# Patient Record
Sex: Male | Born: 1955 | Race: White | Hispanic: No | Marital: Married | Smoking: Never smoker
Health system: Southern US, Community
[De-identification: ages and names within clinical notes are randomized; demographics above are authoritative.]

## PROBLEM LIST (undated history)

## (undated) DIAGNOSIS — M4802 Spinal stenosis, cervical region: Secondary | ICD-10-CM

## (undated) DIAGNOSIS — N186 End stage renal disease: Secondary | ICD-10-CM

## (undated) DIAGNOSIS — E785 Hyperlipidemia, unspecified: Secondary | ICD-10-CM

## (undated) DIAGNOSIS — R1310 Dysphagia, unspecified: Secondary | ICD-10-CM

## (undated) DIAGNOSIS — I255 Ischemic cardiomyopathy: Secondary | ICD-10-CM

## (undated) DIAGNOSIS — N289 Disorder of kidney and ureter, unspecified: Secondary | ICD-10-CM

## (undated) DIAGNOSIS — I480 Paroxysmal atrial fibrillation: Secondary | ICD-10-CM

## (undated) DIAGNOSIS — N051 Unspecified nephritic syndrome with focal and segmental glomerular lesions: Secondary | ICD-10-CM

## (undated) DIAGNOSIS — I2510 Atherosclerotic heart disease of native coronary artery without angina pectoris: Secondary | ICD-10-CM

## (undated) DIAGNOSIS — Z992 Dependence on renal dialysis: Secondary | ICD-10-CM

## (undated) DIAGNOSIS — L139 Bullous disorder, unspecified: Secondary | ICD-10-CM

## (undated) HISTORY — DX: End stage renal disease: N18.6

## (undated) HISTORY — PX: CARPAL TUNNEL RELEASE: SHX101

## (undated) HISTORY — DX: Ischemic cardiomyopathy: I25.5

## (undated) HISTORY — DX: Paroxysmal atrial fibrillation: I48.0

## (undated) HISTORY — DX: Atherosclerotic heart disease of native coronary artery without angina pectoris: I25.10

---

## 2006-04-11 ENCOUNTER — Ambulatory Visit: Payer: Self-pay | Admitting: Gastroenterology

## 2006-07-04 ENCOUNTER — Emergency Department: Payer: Self-pay | Admitting: Emergency Medicine

## 2012-05-04 ENCOUNTER — Emergency Department: Payer: Self-pay | Admitting: Emergency Medicine

## 2012-05-04 LAB — BASIC METABOLIC PANEL
BUN: 15 mg/dL (ref 7–18)
Chloride: 97 mmol/L — ABNORMAL LOW (ref 98–107)
Co2: 35 mmol/L — ABNORMAL HIGH (ref 21–32)
Creatinine: 4.35 mg/dL — ABNORMAL HIGH (ref 0.60–1.30)
EGFR (African American): 16 — ABNORMAL LOW
EGFR (Non-African Amer.): 14 — ABNORMAL LOW

## 2012-05-04 LAB — CBC
HGB: 11.3 g/dL — ABNORMAL LOW (ref 13.0–18.0)
MCH: 32.5 pg (ref 26.0–34.0)
MCV: 95 fL (ref 80–100)
Platelet: 73 10*3/uL — ABNORMAL LOW (ref 150–440)
RDW: 14.8 % — ABNORMAL HIGH (ref 11.5–14.5)

## 2012-05-04 LAB — CK TOTAL AND CKMB (NOT AT ARMC): CK-MB: <0.5 ng/mL — ABNORMAL LOW (ref 0.5–3.6)

## 2012-08-12 ENCOUNTER — Emergency Department: Payer: Self-pay | Admitting: Emergency Medicine

## 2012-08-12 LAB — BASIC METABOLIC PANEL
Calcium, Total: 8.7 mg/dL (ref 8.5–10.1)
EGFR (African American): 9 — ABNORMAL LOW
EGFR (Non-African Amer.): 8 — ABNORMAL LOW
Glucose: 77 mg/dL (ref 65–99)
Osmolality: 283 (ref 275–301)
Potassium: 4 mmol/L (ref 3.5–5.1)

## 2012-08-12 LAB — CBC
HCT: 27.1 % — ABNORMAL LOW (ref 40.0–52.0)
MCH: 31.9 pg (ref 26.0–34.0)
MCV: 94 fL (ref 80–100)
Platelet: 57 10*3/uL — ABNORMAL LOW (ref 150–440)
RBC: 2.88 10*6/uL — ABNORMAL LOW (ref 4.40–5.90)
RDW: 15.4 % — ABNORMAL HIGH (ref 11.5–14.5)

## 2012-08-12 LAB — DIFFERENTIAL
Eosinophil #: 0 10*3/uL (ref 0.0–0.7)
Lymphocyte #: 0.8 10*3/uL — ABNORMAL LOW (ref 1.0–3.6)
Monocyte #: 0.2 x10 3/mm (ref 0.2–1.0)
Monocyte %: 9.3 %

## 2013-01-04 HISTORY — PX: OTHER SURGICAL HISTORY: SHX169

## 2013-06-16 ENCOUNTER — Emergency Department: Payer: Self-pay | Admitting: Emergency Medicine

## 2013-06-16 LAB — CBC
HCT: 37.5 % — AB (ref 40.0–52.0)
HGB: 12.1 g/dL — ABNORMAL LOW (ref 13.0–18.0)
MCH: 32.1 pg (ref 26.0–34.0)
MCHC: 32.2 g/dL (ref 32.0–36.0)
MCV: 100 fL (ref 80–100)
Platelet: 58 10*3/uL — ABNORMAL LOW (ref 150–440)
RBC: 3.77 10*6/uL — ABNORMAL LOW (ref 4.40–5.90)
RDW: 14.1 % (ref 11.5–14.5)
WBC: 2.4 10*3/uL — ABNORMAL LOW (ref 3.8–10.6)

## 2013-06-16 LAB — COMPREHENSIVE METABOLIC PANEL
Albumin: 3.3 g/dL — ABNORMAL LOW (ref 3.4–5.0)
Alkaline Phosphatase: 62 U/L
Anion Gap: 7 (ref 7–16)
BUN: 38 mg/dL — AB (ref 7–18)
Bilirubin,Total: 0.4 mg/dL (ref 0.2–1.0)
Calcium, Total: 8.9 mg/dL (ref 8.5–10.1)
Chloride: 103 mmol/L (ref 98–107)
Co2: 31 mmol/L (ref 21–32)
Creatinine: 8.82 mg/dL — ABNORMAL HIGH (ref 0.60–1.30)
EGFR (African American): 7 — ABNORMAL LOW
EGFR (Non-African Amer.): 6 — ABNORMAL LOW
Glucose: 77 mg/dL (ref 65–99)
Osmolality: 289 (ref 275–301)
POTASSIUM: 4.4 mmol/L (ref 3.5–5.1)
SGOT(AST): 25 U/L (ref 15–37)
SGPT (ALT): 19 U/L (ref 12–78)
Sodium: 141 mmol/L (ref 136–145)
Total Protein: 7.9 g/dL (ref 6.4–8.2)

## 2016-01-05 HISTORY — PX: SPINAL FUSION: SHX223

## 2016-06-10 DIAGNOSIS — M4802 Spinal stenosis, cervical region: Secondary | ICD-10-CM | POA: Diagnosis not present

## 2016-06-10 DIAGNOSIS — R1312 Dysphagia, oropharyngeal phase: Secondary | ICD-10-CM | POA: Diagnosis not present

## 2016-06-10 DIAGNOSIS — F3289 Other specified depressive episodes: Secondary | ICD-10-CM | POA: Diagnosis not present

## 2016-06-10 DIAGNOSIS — Z5189 Encounter for other specified aftercare: Secondary | ICD-10-CM | POA: Diagnosis not present

## 2016-06-10 DIAGNOSIS — F329 Major depressive disorder, single episode, unspecified: Secondary | ICD-10-CM | POA: Diagnosis not present

## 2016-06-10 DIAGNOSIS — K219 Gastro-esophageal reflux disease without esophagitis: Secondary | ICD-10-CM | POA: Diagnosis not present

## 2016-06-10 DIAGNOSIS — R498 Other voice and resonance disorders: Secondary | ICD-10-CM | POA: Diagnosis not present

## 2016-06-10 DIAGNOSIS — Z992 Dependence on renal dialysis: Secondary | ICD-10-CM | POA: Diagnosis not present

## 2016-06-10 DIAGNOSIS — R2681 Unsteadiness on feet: Secondary | ICD-10-CM | POA: Diagnosis not present

## 2016-06-10 DIAGNOSIS — G8929 Other chronic pain: Secondary | ICD-10-CM | POA: Diagnosis not present

## 2016-06-10 DIAGNOSIS — N186 End stage renal disease: Secondary | ICD-10-CM | POA: Diagnosis not present

## 2016-06-10 DIAGNOSIS — G8918 Other acute postprocedural pain: Secondary | ICD-10-CM | POA: Diagnosis not present

## 2016-06-10 DIAGNOSIS — R531 Weakness: Secondary | ICD-10-CM | POA: Diagnosis not present

## 2016-06-10 DIAGNOSIS — M62838 Other muscle spasm: Secondary | ICD-10-CM | POA: Diagnosis not present

## 2016-06-10 DIAGNOSIS — I10 Essential (primary) hypertension: Secondary | ICD-10-CM | POA: Diagnosis not present

## 2016-06-10 DIAGNOSIS — M6281 Muscle weakness (generalized): Secondary | ICD-10-CM | POA: Diagnosis not present

## 2016-06-10 DIAGNOSIS — G4700 Insomnia, unspecified: Secondary | ICD-10-CM | POA: Diagnosis not present

## 2016-06-10 DIAGNOSIS — E784 Other hyperlipidemia: Secondary | ICD-10-CM | POA: Diagnosis not present

## 2016-06-10 DIAGNOSIS — M4322 Fusion of spine, cervical region: Secondary | ICD-10-CM | POA: Diagnosis not present

## 2016-06-10 DIAGNOSIS — E785 Hyperlipidemia, unspecified: Secondary | ICD-10-CM | POA: Diagnosis not present

## 2016-06-10 DIAGNOSIS — H409 Unspecified glaucoma: Secondary | ICD-10-CM | POA: Diagnosis not present

## 2016-06-10 DIAGNOSIS — R41841 Cognitive communication deficit: Secondary | ICD-10-CM | POA: Diagnosis not present

## 2016-06-13 DIAGNOSIS — H409 Unspecified glaucoma: Secondary | ICD-10-CM | POA: Diagnosis not present

## 2016-06-13 DIAGNOSIS — M4322 Fusion of spine, cervical region: Secondary | ICD-10-CM | POA: Diagnosis not present

## 2016-06-13 DIAGNOSIS — F329 Major depressive disorder, single episode, unspecified: Secondary | ICD-10-CM | POA: Diagnosis not present

## 2016-06-13 DIAGNOSIS — K219 Gastro-esophageal reflux disease without esophagitis: Secondary | ICD-10-CM | POA: Diagnosis not present

## 2016-06-13 DIAGNOSIS — N186 End stage renal disease: Secondary | ICD-10-CM | POA: Diagnosis not present

## 2016-06-13 DIAGNOSIS — R531 Weakness: Secondary | ICD-10-CM | POA: Diagnosis not present

## 2016-06-13 DIAGNOSIS — Z992 Dependence on renal dialysis: Secondary | ICD-10-CM | POA: Diagnosis not present

## 2016-06-13 DIAGNOSIS — I10 Essential (primary) hypertension: Secondary | ICD-10-CM | POA: Diagnosis not present

## 2016-06-22 DIAGNOSIS — F329 Major depressive disorder, single episode, unspecified: Secondary | ICD-10-CM | POA: Diagnosis not present

## 2016-06-22 DIAGNOSIS — Z992 Dependence on renal dialysis: Secondary | ICD-10-CM | POA: Diagnosis not present

## 2016-06-22 DIAGNOSIS — I10 Essential (primary) hypertension: Secondary | ICD-10-CM | POA: Diagnosis not present

## 2016-06-22 DIAGNOSIS — N186 End stage renal disease: Secondary | ICD-10-CM | POA: Diagnosis not present

## 2016-06-22 DIAGNOSIS — K219 Gastro-esophageal reflux disease without esophagitis: Secondary | ICD-10-CM | POA: Diagnosis not present

## 2016-06-22 DIAGNOSIS — G8918 Other acute postprocedural pain: Secondary | ICD-10-CM | POA: Diagnosis not present

## 2016-06-22 DIAGNOSIS — E785 Hyperlipidemia, unspecified: Secondary | ICD-10-CM | POA: Diagnosis not present

## 2016-06-22 DIAGNOSIS — H409 Unspecified glaucoma: Secondary | ICD-10-CM | POA: Diagnosis not present

## 2016-06-22 DIAGNOSIS — M4322 Fusion of spine, cervical region: Secondary | ICD-10-CM | POA: Diagnosis not present

## 2016-07-03 DIAGNOSIS — N186 End stage renal disease: Secondary | ICD-10-CM | POA: Diagnosis not present

## 2016-07-03 DIAGNOSIS — Z992 Dependence on renal dialysis: Secondary | ICD-10-CM | POA: Diagnosis not present

## 2016-07-07 DIAGNOSIS — I10 Essential (primary) hypertension: Secondary | ICD-10-CM | POA: Diagnosis not present

## 2016-07-07 DIAGNOSIS — N186 End stage renal disease: Secondary | ICD-10-CM | POA: Diagnosis not present

## 2016-07-07 DIAGNOSIS — H409 Unspecified glaucoma: Secondary | ICD-10-CM | POA: Diagnosis not present

## 2016-07-07 DIAGNOSIS — F329 Major depressive disorder, single episode, unspecified: Secondary | ICD-10-CM | POA: Diagnosis not present

## 2016-07-07 DIAGNOSIS — M4322 Fusion of spine, cervical region: Secondary | ICD-10-CM | POA: Diagnosis not present

## 2016-07-07 DIAGNOSIS — K219 Gastro-esophageal reflux disease without esophagitis: Secondary | ICD-10-CM | POA: Diagnosis not present

## 2016-07-07 DIAGNOSIS — Z992 Dependence on renal dialysis: Secondary | ICD-10-CM | POA: Diagnosis not present

## 2016-07-10 DIAGNOSIS — H409 Unspecified glaucoma: Secondary | ICD-10-CM | POA: Diagnosis not present

## 2016-07-10 DIAGNOSIS — G8929 Other chronic pain: Secondary | ICD-10-CM | POA: Diagnosis not present

## 2016-07-10 DIAGNOSIS — G4733 Obstructive sleep apnea (adult) (pediatric): Secondary | ICD-10-CM | POA: Diagnosis not present

## 2016-07-10 DIAGNOSIS — N186 End stage renal disease: Secondary | ICD-10-CM | POA: Diagnosis not present

## 2016-07-10 DIAGNOSIS — Z992 Dependence on renal dialysis: Secondary | ICD-10-CM | POA: Diagnosis not present

## 2016-07-10 DIAGNOSIS — Z940 Kidney transplant status: Secondary | ICD-10-CM | POA: Diagnosis not present

## 2016-07-10 DIAGNOSIS — I1311 Hypertensive heart and chronic kidney disease without heart failure, with stage 5 chronic kidney disease, or end stage renal disease: Secondary | ICD-10-CM | POA: Diagnosis not present

## 2016-07-10 DIAGNOSIS — Z85528 Personal history of other malignant neoplasm of kidney: Secondary | ICD-10-CM | POA: Diagnosis not present

## 2016-07-10 DIAGNOSIS — H269 Unspecified cataract: Secondary | ICD-10-CM | POA: Diagnosis not present

## 2016-07-10 DIAGNOSIS — F329 Major depressive disorder, single episode, unspecified: Secondary | ICD-10-CM | POA: Diagnosis not present

## 2016-07-10 DIAGNOSIS — Z4789 Encounter for other orthopedic aftercare: Secondary | ICD-10-CM | POA: Diagnosis not present

## 2016-07-10 DIAGNOSIS — G629 Polyneuropathy, unspecified: Secondary | ICD-10-CM | POA: Diagnosis not present

## 2016-07-10 DIAGNOSIS — D509 Iron deficiency anemia, unspecified: Secondary | ICD-10-CM | POA: Diagnosis not present

## 2016-07-10 DIAGNOSIS — L120 Bullous pemphigoid: Secondary | ICD-10-CM | POA: Diagnosis not present

## 2016-07-12 ENCOUNTER — Emergency Department: Payer: Medicare Other

## 2016-07-12 ENCOUNTER — Inpatient Hospital Stay
Admission: EM | Admit: 2016-07-12 | Discharge: 2016-07-17 | DRG: 444 | Disposition: A | Discharge status: Home / Self Care | Payer: Medicare Other | Attending: Internal Medicine | Admitting: Internal Medicine

## 2016-07-12 DIAGNOSIS — R2230 Localized swelling, mass and lump, unspecified upper limb: Secondary | ICD-10-CM

## 2016-07-12 DIAGNOSIS — Z9081 Acquired absence of spleen: Secondary | ICD-10-CM

## 2016-07-12 DIAGNOSIS — Z882 Allergy status to sulfonamides status: Secondary | ICD-10-CM

## 2016-07-12 DIAGNOSIS — K810 Acute cholecystitis: Secondary | ICD-10-CM | POA: Diagnosis not present

## 2016-07-12 DIAGNOSIS — R001 Bradycardia, unspecified: Secondary | ICD-10-CM | POA: Diagnosis not present

## 2016-07-12 DIAGNOSIS — K819 Cholecystitis, unspecified: Principal | ICD-10-CM | POA: Diagnosis present

## 2016-07-12 DIAGNOSIS — L89029 Pressure ulcer of left elbow, unspecified stage: Secondary | ICD-10-CM | POA: Diagnosis present

## 2016-07-12 DIAGNOSIS — I120 Hypertensive chronic kidney disease with stage 5 chronic kidney disease or end stage renal disease: Secondary | ICD-10-CM | POA: Diagnosis present

## 2016-07-12 DIAGNOSIS — Z79899 Other long term (current) drug therapy: Secondary | ICD-10-CM | POA: Diagnosis not present

## 2016-07-12 DIAGNOSIS — R609 Edema, unspecified: Secondary | ICD-10-CM

## 2016-07-12 DIAGNOSIS — R17 Unspecified jaundice: Secondary | ICD-10-CM

## 2016-07-12 DIAGNOSIS — I959 Hypotension, unspecified: Secondary | ICD-10-CM | POA: Diagnosis not present

## 2016-07-12 DIAGNOSIS — Q819 Epidermolysis bullosa, unspecified: Secondary | ICD-10-CM

## 2016-07-12 DIAGNOSIS — Z8249 Family history of ischemic heart disease and other diseases of the circulatory system: Secondary | ICD-10-CM

## 2016-07-12 DIAGNOSIS — Z992 Dependence on renal dialysis: Secondary | ICD-10-CM | POA: Diagnosis not present

## 2016-07-12 DIAGNOSIS — D631 Anemia in chronic kidney disease: Secondary | ICD-10-CM | POA: Diagnosis present

## 2016-07-12 DIAGNOSIS — Z981 Arthrodesis status: Secondary | ICD-10-CM

## 2016-07-12 DIAGNOSIS — R059 Cough, unspecified: Secondary | ICD-10-CM

## 2016-07-12 DIAGNOSIS — N2581 Secondary hyperparathyroidism of renal origin: Secondary | ICD-10-CM | POA: Diagnosis present

## 2016-07-12 DIAGNOSIS — M79601 Pain in right arm: Secondary | ICD-10-CM | POA: Diagnosis not present

## 2016-07-12 DIAGNOSIS — N269 Renal sclerosis, unspecified: Secondary | ICD-10-CM | POA: Diagnosis present

## 2016-07-12 DIAGNOSIS — R05 Cough: Secondary | ICD-10-CM | POA: Diagnosis not present

## 2016-07-12 DIAGNOSIS — A419 Sepsis, unspecified organism: Secondary | ICD-10-CM

## 2016-07-12 DIAGNOSIS — R2232 Localized swelling, mass and lump, left upper limb: Secondary | ICD-10-CM | POA: Diagnosis not present

## 2016-07-12 DIAGNOSIS — R109 Unspecified abdominal pain: Secondary | ICD-10-CM | POA: Diagnosis not present

## 2016-07-12 DIAGNOSIS — Z880 Allergy status to penicillin: Secondary | ICD-10-CM

## 2016-07-12 DIAGNOSIS — F329 Major depressive disorder, single episode, unspecified: Secondary | ICD-10-CM | POA: Diagnosis not present

## 2016-07-12 DIAGNOSIS — M7989 Other specified soft tissue disorders: Secondary | ICD-10-CM

## 2016-07-12 DIAGNOSIS — R531 Weakness: Secondary | ICD-10-CM | POA: Diagnosis not present

## 2016-07-12 DIAGNOSIS — Z888 Allergy status to other drugs, medicaments and biological substances status: Secondary | ICD-10-CM | POA: Diagnosis not present

## 2016-07-12 DIAGNOSIS — R400 Somnolence: Secondary | ICD-10-CM

## 2016-07-12 DIAGNOSIS — D72829 Elevated white blood cell count, unspecified: Secondary | ICD-10-CM

## 2016-07-12 DIAGNOSIS — D8989 Other specified disorders involving the immune mechanism, not elsewhere classified: Secondary | ICD-10-CM | POA: Diagnosis not present

## 2016-07-12 DIAGNOSIS — L03114 Cellulitis of left upper limb: Secondary | ICD-10-CM | POA: Diagnosis not present

## 2016-07-12 DIAGNOSIS — M79632 Pain in left forearm: Secondary | ICD-10-CM

## 2016-07-12 DIAGNOSIS — Z4789 Encounter for other orthopedic aftercare: Secondary | ICD-10-CM | POA: Diagnosis not present

## 2016-07-12 DIAGNOSIS — N186 End stage renal disease: Secondary | ICD-10-CM | POA: Diagnosis not present

## 2016-07-12 DIAGNOSIS — R600 Localized edema: Secondary | ICD-10-CM | POA: Diagnosis not present

## 2016-07-12 DIAGNOSIS — Z905 Acquired absence of kidney: Secondary | ICD-10-CM

## 2016-07-12 DIAGNOSIS — I1311 Hypertensive heart and chronic kidney disease without heart failure, with stage 5 chronic kidney disease, or end stage renal disease: Secondary | ICD-10-CM | POA: Diagnosis not present

## 2016-07-12 DIAGNOSIS — G629 Polyneuropathy, unspecified: Secondary | ICD-10-CM | POA: Diagnosis not present

## 2016-07-12 DIAGNOSIS — L120 Bullous pemphigoid: Secondary | ICD-10-CM | POA: Diagnosis not present

## 2016-07-12 HISTORY — DX: Hyperlipidemia, unspecified: E78.5

## 2016-07-12 HISTORY — DX: Spinal stenosis, cervical region: M48.02

## 2016-07-12 HISTORY — DX: Unspecified nephritic syndrome with focal and segmental glomerular lesions: N05.1

## 2016-07-12 HISTORY — DX: Dysphagia, unspecified: R13.10

## 2016-07-12 LAB — TROPONIN I: Troponin I: 0.03 ng/mL

## 2016-07-12 LAB — DIFFERENTIAL
BASOS ABS: 0.2 10*3/uL — AB (ref 0–0.1)
Basophils Relative: 1 %
Eosinophils Absolute: 1.6 10*3/uL — ABNORMAL HIGH (ref 0–0.7)
Eosinophils Relative: 7 %
LYMPHS PCT: 7 %
Lymphs Abs: 1.5 10*3/uL (ref 1.0–3.6)
MONO ABS: 1.1 10*3/uL — AB (ref 0.2–1.0)
Monocytes Relative: 5 %
NEUTROS ABS: 18.4 10*3/uL — AB (ref 1.4–6.5)
Neutrophils Relative %: 80 %

## 2016-07-12 LAB — BASIC METABOLIC PANEL
Anion gap: 13 (ref 5–15)
BUN: 54 mg/dL — AB (ref 6–20)
CHLORIDE: 96 mmol/L — AB (ref 101–111)
CO2: 27 mmol/L (ref 22–32)
CREATININE: 7.16 mg/dL — AB (ref 0.61–1.24)
Calcium: 9.5 mg/dL (ref 8.9–10.3)
GFR calc Af Amer: 9 mL/min — ABNORMAL LOW (ref >60)
GFR calc non Af Amer: 7 mL/min — ABNORMAL LOW (ref >60)
GLUCOSE: 123 mg/dL — AB (ref 65–99)
Potassium: 3.8 mmol/L (ref 3.5–5.1)
SODIUM: 136 mmol/L (ref 135–145)

## 2016-07-12 LAB — HEPATIC FUNCTION PANEL
ALT: 47 U/L (ref 17–63)
AST: 75 U/L — ABNORMAL HIGH (ref 15–41)
Albumin: 2.5 g/dL — ABNORMAL LOW (ref 3.5–5.0)
Alkaline Phosphatase: 205 U/L — ABNORMAL HIGH (ref 38–126)
Bilirubin, Direct: 0.2 mg/dL (ref 0.1–0.5)
Indirect Bilirubin: 1.7 mg/dL — ABNORMAL HIGH (ref 0.3–0.9)
Total Bilirubin: 1.9 mg/dL — ABNORMAL HIGH (ref 0.3–1.2)
Total Protein: 7.6 g/dL (ref 6.5–8.1)

## 2016-07-12 LAB — CBC
HCT: 31.3 % — ABNORMAL LOW (ref 40.0–52.0)
Hemoglobin: 10.4 g/dL — ABNORMAL LOW (ref 13.0–18.0)
MCH: 34.5 pg — ABNORMAL HIGH (ref 26.0–34.0)
MCHC: 33.3 g/dL (ref 32.0–36.0)
MCV: 103.6 fL — AB (ref 80.0–100.0)
PLATELETS: 344 10*3/uL (ref 150–440)
RBC: 3.02 MIL/uL — ABNORMAL LOW (ref 4.40–5.90)
RDW: 15.2 % — AB (ref 11.5–14.5)
WBC: 23.4 10*3/uL — AB (ref 3.8–10.6)

## 2016-07-12 LAB — LACTIC ACID, PLASMA: Lactic Acid, Venous: 1.4 mmol/L (ref 0.5–1.9)

## 2016-07-12 MED ORDER — LORATADINE 10 MG PO TABS
10.0000 mg | ORAL_TABLET | Freq: Every day | ORAL | Status: DC
Start: 2016-07-13 — End: 2016-07-17
  Administered 2016-07-13 – 2016-07-16 (×4): 10 mg via ORAL
  Filled 2016-07-12 (×4): qty 1

## 2016-07-12 MED ORDER — TRAZODONE HCL 100 MG PO TABS
100.0000 mg | ORAL_TABLET | Freq: Every day | ORAL | Status: DC
  Administered 2016-07-13 – 2016-07-16 (×4): 100 mg via ORAL
  Filled 2016-07-12 (×6): qty 1

## 2016-07-12 MED ORDER — ATORVASTATIN CALCIUM 20 MG PO TABS
20.0000 mg | ORAL_TABLET | Freq: Every day | ORAL | Status: DC
  Administered 2016-07-13 – 2016-07-16 (×4): 20 mg via ORAL
  Filled 2016-07-12 (×4): qty 1

## 2016-07-12 MED ORDER — TIMOLOL HEMIHYDRATE 0.5 % OP SOLN
1.0000 [drp] | Freq: Every day | OPHTHALMIC | Status: DC
  Administered 2016-07-13 – 2016-07-16 (×3): 1 [drp] via OPHTHALMIC
  Filled 2016-07-12: qty 5

## 2016-07-12 MED ORDER — BISACODYL 10 MG RE SUPP
10.0000 mg | Freq: Every day | RECTAL | Status: DC | PRN
  Administered 2016-07-13: 10 mg via RECTAL
  Filled 2016-07-12: qty 1

## 2016-07-12 MED ORDER — DEXTROSE 5 % IV SOLN
500.0000 mg | Freq: Two times a day (BID) | INTRAVENOUS | Status: DC
  Administered 2016-07-13: 500 mg via INTRAVENOUS
  Filled 2016-07-12 (×2): qty 0.5

## 2016-07-12 MED ORDER — OXYCODONE HCL 5 MG PO TABS
5.0000 mg | ORAL_TABLET | ORAL | Status: DC | PRN
  Administered 2016-07-13: 10 mg via ORAL
  Administered 2016-07-13: 5 mg via ORAL
  Administered 2016-07-14 – 2016-07-16 (×5): 10 mg via ORAL
  Administered 2016-07-16: 5 mg via ORAL
  Administered 2016-07-16: 10 mg via ORAL
  Administered 2016-07-17 (×2): 5 mg via ORAL
  Filled 2016-07-12 (×2): qty 2
  Filled 2016-07-12: qty 1
  Filled 2016-07-12 (×3): qty 2
  Filled 2016-07-12 (×2): qty 1
  Filled 2016-07-12 (×3): qty 2

## 2016-07-12 MED ORDER — VANCOMYCIN HCL IN DEXTROSE 750-5 MG/150ML-% IV SOLN
750.0000 mg | INTRAVENOUS | Status: DC
  Administered 2016-07-13 – 2016-07-15 (×2): 750 mg via INTRAVENOUS
  Filled 2016-07-12 (×4): qty 150

## 2016-07-12 MED ORDER — OXYCODONE HCL 5 MG PO TABS
ORAL_TABLET | ORAL | Status: AC
  Administered 2016-07-12: 5 mg
  Filled 2016-07-12: qty 1

## 2016-07-12 MED ORDER — RENA-VITE PO TABS
1.0000 | ORAL_TABLET | Freq: Every day | ORAL | Status: DC
  Administered 2016-07-13 – 2016-07-16 (×4): 1 via ORAL
  Filled 2016-07-12 (×6): qty 1

## 2016-07-12 MED ORDER — NAPROXEN 500 MG PO TABS
500.0000 mg | ORAL_TABLET | Freq: Two times a day (BID) | ORAL | Status: DC
  Administered 2016-07-13 – 2016-07-16 (×6): 500 mg via ORAL
  Filled 2016-07-12 (×10): qty 1

## 2016-07-12 MED ORDER — CINACALCET HCL 30 MG PO TABS
90.0000 mg | ORAL_TABLET | Freq: Every day | ORAL | Status: DC
  Administered 2016-07-13 – 2016-07-16 (×4): 90 mg via ORAL
  Filled 2016-07-12 (×5): qty 3

## 2016-07-12 MED ORDER — CALCIUM ACETATE (PHOS BINDER) 667 MG PO CAPS
667.0000 mg | ORAL_CAPSULE | Freq: Three times a day (TID) | ORAL | Status: DC
  Administered 2016-07-13 – 2016-07-17 (×8): 667 mg via ORAL
  Filled 2016-07-12 (×15): qty 1

## 2016-07-12 MED ORDER — GABAPENTIN 100 MG PO CAPS
100.0000 mg | ORAL_CAPSULE | Freq: Every day | ORAL | Status: DC
  Administered 2016-07-13 – 2016-07-16 (×4): 100 mg via ORAL
  Filled 2016-07-12 (×5): qty 1

## 2016-07-12 MED ORDER — LEVOFLOXACIN IN D5W 750 MG/150ML IV SOLN
750.0000 mg | Freq: Once | INTRAVENOUS | Status: AC
  Administered 2016-07-12: 750 mg via INTRAVENOUS
  Filled 2016-07-12: qty 150

## 2016-07-12 MED ORDER — PANTOPRAZOLE SODIUM 40 MG PO TBEC
40.0000 mg | DELAYED_RELEASE_TABLET | Freq: Every day | ORAL | Status: DC
  Administered 2016-07-13 – 2016-07-17 (×5): 40 mg via ORAL
  Filled 2016-07-12 (×5): qty 1

## 2016-07-12 MED ORDER — SIMETHICONE 80 MG PO CHEW
80.0000 mg | CHEWABLE_TABLET | Freq: Three times a day (TID) | ORAL | Status: DC
Start: 2016-07-12 — End: 2016-07-17
  Administered 2016-07-13 – 2016-07-16 (×9): 80 mg via ORAL
  Filled 2016-07-12 (×16): qty 1

## 2016-07-12 MED ORDER — AZTREONAM 2 G IJ SOLR
2.0000 g | Freq: Once | INTRAMUSCULAR | Status: AC
  Administered 2016-07-12: 2 g via INTRAVENOUS
  Filled 2016-07-12: qty 2

## 2016-07-12 MED ORDER — ACETAMINOPHEN 325 MG PO TABS
975.0000 mg | ORAL_TABLET | Freq: Three times a day (TID) | ORAL | Status: DC
  Administered 2016-07-13 – 2016-07-17 (×10): 975 mg via ORAL
  Filled 2016-07-12 (×10): qty 3

## 2016-07-12 MED ORDER — LEVOFLOXACIN IN D5W 250 MG/50ML IV SOLN: 250.0000 mg | INTRAVENOUS | Status: DC

## 2016-07-12 MED ORDER — METHOCARBAMOL 500 MG PO TABS
500.0000 mg | ORAL_TABLET | Freq: Two times a day (BID) | ORAL | Status: DC
  Administered 2016-07-13 – 2016-07-17 (×9): 500 mg via ORAL
  Filled 2016-07-12 (×9): qty 1

## 2016-07-12 MED ORDER — DOCUSATE SODIUM 100 MG PO CAPS
100.0000 mg | ORAL_CAPSULE | Freq: Two times a day (BID) | ORAL | Status: DC | PRN
Start: 2016-07-12 — End: 2016-07-17

## 2016-07-12 MED ORDER — SERTRALINE HCL 50 MG PO TABS
150.0000 mg | ORAL_TABLET | Freq: Every day | ORAL | Status: DC
  Administered 2016-07-13 – 2016-07-16 (×4): 150 mg via ORAL
  Filled 2016-07-12 (×5): qty 3

## 2016-07-12 MED ORDER — SENNOSIDES-DOCUSATE SODIUM 8.6-50 MG PO TABS
1.0000 | ORAL_TABLET | Freq: Two times a day (BID) | ORAL | Status: DC
  Administered 2016-07-13 – 2016-07-16 (×7): 1 via ORAL
  Filled 2016-07-12 (×7): qty 1

## 2016-07-12 MED ORDER — HEPARIN SODIUM (PORCINE) 5000 UNIT/ML IJ SOLN
5000.0000 [IU] | Freq: Three times a day (TID) | INTRAMUSCULAR | Status: DC
  Administered 2016-07-13 – 2016-07-17 (×10): 5000 [IU] via SUBCUTANEOUS
  Filled 2016-07-12 (×11): qty 1

## 2016-07-12 MED ORDER — VANCOMYCIN HCL 10 G IV SOLR
1500.0000 mg | Freq: Once | INTRAVENOUS | Status: AC
  Administered 2016-07-12: 1500 mg via INTRAVENOUS
  Filled 2016-07-12: qty 1500

## 2016-07-12 MED ORDER — SODIUM CHLORIDE 0.9 % IV BOLUS (SEPSIS)
500.0000 mL | Freq: Once | INTRAVENOUS | Status: AC
  Administered 2016-07-12: 500 mL via INTRAVENOUS

## 2016-07-12 NOTE — Progress Notes (Signed)
Family Meeting Note  Advance Directive:yes  Today a meeting took place with the Patient and his daughter.  The following clinical team members were present during this meeting:MD  The following were discussed:Patient's diagnosis: auto immune disease, ESRD on HD, Hypotension , Patient's progosis: Unable to determine and Goals for treatment: Full Code  Additional follow-up to be provided: ID, Nephro consult.  I discussed with patient and his daughter who is his power of attorney, asked him about his wishes for CPR, defibrillation use, intubation and ventilator use and patient told me clearly that he would want all the interventions initially but if there is no chance of survival or recovery then he would not want to continue aggressive measures.  Time spent during discussion:20 minutes  Kennia Vanvorst, Rosalio Macadamia, MD

## 2016-07-12 NOTE — ED Notes (Addendum)
Pt bib EMS from home w/ c/o gen weakness and fatigue. Pt A/OX4, resp even and unlabored. Denies CP or SOB. Pt home from [facility] on Friday, pt at [facility] for 7 weeks after spinal fusion. Family sts that pts medications changed during stay at [facility]. Family reports that pt took wife's medication yesterday by mistake and took his normal ones today.

## 2016-07-12 NOTE — H&P (Signed)
[place] [facility] [place] NAME: William Carpenter    MR#:  [id number]  DATE OF BIRTH:  30-Jan-1955  DATE OF ADMISSION:  07/12/2016  PRIMARY CARE PHYSICIAN: Raelyn Mora, MD   REQUESTING/REFERRING PHYSICIAN: norman  CHIEF COMPLAINT:   Chief Complaint  Patient presents with  . Fatigue  . Weakness    HISTORY OF PRESENT ILLNESS: William Carpenter  is a 61 y.o. 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 More than 20 years, spinal stenosis and cervical area and had undergone decompression surgery and cervical spines last month. He went to rehabilitation Center after the surgery and came home last week from there. Family noted him having overall weakness and not willing to move around March which is progressively getting worse for 1 week. The denies any fever or chills, denies any diarrhea or vomiting or pain anywhere. And also denied any confusion. Patient also have some kind of autoimmune skin disorder where he follows with dermatology clinic at [facility]. He was brought to emergency room by EMS and [facility] hospital is on divergent so ER physician did further workup which showed heme having hypotension and elevated white blood cell count. Chest x-ray is without any infiltrates. He does not produce any urine. Concerned with this suspected of having some unknown infection and started on broad spectrum antibiotic and given to hospitalist team.   PAST MEDICAL HISTORY:   Past Medical History:  Diagnosis Date  . Autoimmune disease ([place])    skin problem  . Dysphagia   . FSGS (focal segmental glomerulosclerosis)   . Hyperlipidemia   . Muscle weakness (generalized)   . Renal disorder    end stage   . Spinal stenosis, cervical region     PAST SURGICAL HISTORY: Past Surgical History:  Procedure Laterality Date  . SPINAL FUSION  2018    SOCIAL  HISTORY:  Social History  Substance Use Topics  . Smoking status: Never Smoker  . Smokeless tobacco: Never Used  . Alcohol use No    FAMILY HISTORY:  Family History  Problem Relation Age of Onset  . Hypertension Mother     DRUG ALLERGIES:  Allergies  Allergen Reactions  . Albumin (Human) Hives  . Aloe   . Iodinated Diagnostic Agents   . Iron Dextran   . Metoprolol Tartrate   . Penicillins Swelling  . Quinine Sulfate [Quinine]   . Rabeprazole   . Sulfa Antibiotics Other (See Comments)  . Trimethoprim     REVIEW OF SYSTEMS:   CONSTITUTIONAL: No fever,Positive for fatigue or weakness.  EYES: No blurred or double vision.  EARS, NOSE, AND THROAT: No tinnitus or ear pain.  RESPIRATORY: No cough, shortness of breath, wheezing or hemoptysis.  CARDIOVASCULAR: No chest pain, orthopnea, edema.  GASTROINTESTINAL: No nausea, vomiting, diarrhea or abdominal pain.  GENITOURINARY: No dysuria, hematuria.  ENDOCRINE: No polyuria, nocturia,  HEMATOLOGY: No anemia, easy bruising or bleeding SKIN: No rash or lesion. MUSCULOSKELETAL: No joint pain or arthritis.   NEUROLOGIC: No tingling, numbness, weakness.  PSYCHIATRY: No anxiety or depression.   MEDICATIONS AT HOME:  Prior to Admission medications   Medication Sig Start Date End Date Taking? Authorizing Provider  acetaminophen (TYLENOL) 325 MG tablet Take 975 mg by mouth every 8 (eight) hours.   Yes [provider]  atorvastatin (LIPITOR) 20 MG tablet Take 20 mg by mouth daily.   Yes [provider]  b complex-vitamin c-folic acid (NEPHRO-VITE) 0.8 MG TABS tablet Take 1 tablet by mouth at bedtime.   Yes [provider]  bisacodyl (DULCOLAX) 10 MG suppository Place 10 mg rectally daily as needed for moderate constipation.   Yes [provider]  calcium acetate (PHOSLO) 667 MG capsule Take 667 mg by mouth 3 (three) times daily with meals.   Yes [provider]  Carboxymethylcellul-Glycerin  (REFRESH OPTIVE SENSITIVE OP) Place 1 drop into both eyes every 4 (four) hours.   Yes [provider]  carvedilol (COREG) 25 MG tablet Take 25 mg by mouth 2 (two) times daily with a meal.   Yes [provider]  cetirizine (ZYRTEC) 10 MG tablet Take 10 mg by mouth daily.   Yes [provider]  cinacalcet (SENSIPAR) 90 MG tablet Take 90 mg by mouth daily.   Yes [provider]  cloNIDine (CATAPRES) 0.1 MG tablet Take 0.1 mg by mouth every 4 (four) hours. Take 1 tablet by mouth every 4 hours as needed for SBP over 180   Yes [provider]  gabapentin (NEURONTIN) 100 MG capsule Take 100 mg by mouth at bedtime.   Yes [provider]  hydrALAZINE (APRESOLINE) 10 MG tablet Take 10 mg by mouth 2 (two) times daily.   Yes [provider]  methocarbamol (ROBAXIN) 500 MG tablet Take 500 mg by mouth every 12 (twelve) hours.   Yes [provider]  naproxen (NAPROSYN) 500 MG tablet Take 500 mg by mouth 2 (two) times daily with a meal.   Yes [provider]  omeprazole (PRILOSEC) 20 MG capsule Take 20 mg by mouth 2 (two) times daily before a meal.   Yes [provider]  oxycodone (OXY-IR) 5 MG capsule Take 5-10 mg by mouth every 4 (four) hours as needed.   Yes [provider]  polyethylene glycol (MIRALAX / GLYCOLAX) packet Take 17 g by mouth daily.   Yes [provider]  Psyllium (KONSYL-D PO) Take 5 mLs by mouth 2 (two) times daily.   Yes [provider]  senna-docusate (SENOKOT-S) 8.6-50 MG tablet Take 1 tablet by mouth 2 (two) times daily.   Yes [provider]  sertraline (ZOLOFT) 50 MG tablet Take 150 mg by mouth at bedtime.   Yes [provider]  simethicone (MYLICON) 80 MG chewable tablet Chew 80 mg by mouth 3 (three) times daily.   Yes [provider]  timolol (BETIMOL) 0.5 % ophthalmic solution Place 1 drop into both eyes daily.   Yes [provider]   traZODone (DESYREL) 100 MG tablet Take 100 mg by mouth at bedtime.   Yes [provider]      PHYSICAL EXAMINATION:   VITAL SIGNS: Blood pressure 103/69, pulse (!) 59, temperature 97.9 F (36.6 C), temperature source Oral, resp. rate 17, height 5\' 10"  (1.778 m), weight 76.2 kg (168 lb), SpO2 97 %.  GENERAL:  61 y.o.-year-old patient lying in the bed with no acute distress.  EYES: Pupils equal, round, reactive to light and accommodation. No scleral icterus. Extraocular muscles intact.  HEENT: Head atraumatic, normocephalic. Oropharynx and nasopharynx clear.  NECK:  Supple, no jugular venous distention. No thyroid enlargement, no tenderness.  LUNGS: Normal breath sounds bilaterally, no wheezing, rales,rhonchi or crepitation. No use of accessory muscles of respiration.  CARDIOVASCULAR: S1, S2 normal. No murmurs, rubs, or gallops.  ABDOMEN: Soft, nontender, nondistended. Bowel sounds present. No organomegaly or mass.  EXTREMITIES: No pedal edema, cyanosis, or  clubbing. Dilated vein and fistula as on his left upper extremity NEUROLOGIC: Cranial nerves II through XII are intact. Muscle strength 5/5 in all extremities. Sensation intact. Gait not checked.  PSYCHIATRIC: The patient is alert and oriented x 3.  SKIN: Multiple areas of healing wounds on skin where he have ongoing autoimmune disease and skin peels off and then there is regenaration of new skin layers.Marland Kitchen   LABORATORY PANEL:   CBC  Recent Labs Lab 07/12/16 1740  WBC 23.4*  HGB 10.4*  HCT 31.3*  PLT 344  MCV 103.6*  MCH 34.5*  MCHC 33.3  RDW 15.2*   ------------------------------------------------------------------------------------------------------------------  Chemistries   Recent Labs Lab 07/12/16 1740  NA 136  K 3.8  CL 96*  CO2 27  GLUCOSE 123*  BUN 54*  CREATININE 7.16*  CALCIUM 9.5  AST 75*  ALT 47  ALKPHOS 205*  BILITOT 1.9*    ------------------------------------------------------------------------------------------------------------------ estimated creatinine clearance is 11.2 mL/min (A) (by C-G formula based on SCr of 7.16 mg/dL (H)). ------------------------------------------------------------------------------------------------------------------ No results for input(s): TSH, T4TOTAL, T3FREE, THYROIDAB in the last 72 hours.  Invalid input(s): FREET3   Coagulation profile No results for input(s): INR, PROTIME in the last 168 hours. ------------------------------------------------------------------------------------------------------------------- No results for input(s): DDIMER in the last 72 hours. -------------------------------------------------------------------------------------------------------------------  Cardiac Enzymes  Recent Labs Lab 07/12/16 1740  TROPONINI 0.03*   ------------------------------------------------------------------------------------------------------------------ Invalid input(s): POCBNP  ---------------------------------------------------------------------------------------------------------------  Urinalysis No results found for: COLORURINE, APPEARANCEUR, LABSPEC, PHURINE, GLUCOSEU, HGBUR, BILIRUBINUR, KETONESUR, PROTEINUR, UROBILINOGEN, NITRITE, LEUKOCYTESUR   RADIOLOGY: Dg Chest 2 View  Result Date: 07/12/2016 CLINICAL DATA:  Generalized weakness, fatigue, and occasional cough EXAM: CHEST  2 VIEW COMPARISON:  05/04/2012 FINDINGS: Normal heart size, mediastinal contours, and pulmonary vascularity. Atherosclerotic calcification aorta. Increased bronchitic changes since prior study. Increased interstitial markings since previous exam, though slightly accentuated previously as well, question mild chronic interstitial lung disease with progression. No segmental consolidation, pleural effusion or pneumothorax. Interval cervical spine fusion. Perigastric surgical clips.  Slightly sclerotic appearance of the spine is unchanged could be related to patient's history of end-stage renal disease per EMR. IMPRESSION: Increased bronchitic interstitial changes since previous exam question increased bronchitis versus progression of chronic interstitial lung disease. Aortic Atherosclerosis (ICD10-I70.0). Electronically Signed   By: Lavonia Dana M.D.   On: 07/12/2016 18:28   US Venous Img Upper Uni Left  Result Date: 07/12/2016 CLINICAL DATA:  Left upper extremity swelling. Patient with history of left upper extremity fistula for dialysis for decades. Swelling for 1 week. EXAM: LEFT UPPER EXTREMITY VENOUS DOPPLER ULTRASOUND TECHNIQUE: Gray-scale sonography with graded compression, as well as color Doppler and duplex ultrasound were performed to evaluate the upper extremity deep venous system from the level of the subclavian vein and including the jugular, axillary, basilic, radial, ulnar and upper cephalic vein. Spectral Doppler was utilized to evaluate flow at rest and with distal augmentation maneuvers. COMPARISON:  None. FINDINGS: Contralateral Subclavian Vein: Respiratory phasicity is normal and symmetric with the symptomatic side. No evidence of thrombus. Normal compressibility. Internal Jugular Vein: No evidence of thrombus. Normal compressibility, respiratory phasicity and response to augmentation. Subclavian Vein: No evidence of thrombus. Normal compressibility, respiratory phasicity and response to augmentation. Axillary Vein: No evidence of thrombus. Normal compressibility, respiratory phasicity and response to augmentation. Cephalic Vein: There is eccentric nonocclusive thrombus/ venous wall thickening in the cephalic vein. Basilic Vein: No evidence of thrombus. Normal compressibility, respiratory phasicity and response to augmentation. Brachial Veins: No evidence of thrombus. Normal compressibility, respiratory phasicity and response to augmentation.  Radial Veins: No evidence of  thrombus. Normal compressibility, respiratory phasicity and response to augmentation. Ulnar Veins: No evidence of thrombus. Normal compressibility, respiratory phasicity and response to augmentation. Other Findings: Limited interrogation at the patient's dialysis fistula demonstrated patent flow in the upper arm anastomosis. There is an avascular fluid collection labeled left shoulder at that measures 4.6 x 1.5 x 4.6 cm. IMPRESSION: 1. No evidence of DVT within the left upper extremity. 2. Chronic appearing nonocclusive thrombus within the cephalic vein. 3. Left upper extremity dialysis fistula which is grossly patent, detailed interrogation not performed. 4. Fluid collection about the left shoulder measuring 4.6 cm, may be liquefying hematoma. No surrounding hyperemia or internal complexity to suggest superimposed infection. Electronically Signed   By: Jeb Levering M.D.   On: 07/12/2016 20:00    EKG: Orders placed or performed during the hospital encounter of 07/12/16  . ED EKG  . ED EKG  . ED EKG 12-Lead  . ED EKG 12-Lead  . EKG 12-Lead  . EKG 12-Lead    IMPRESSION AND PLAN:  * Leukocytosis   Unknown infection, we'll give broad-spectrum antibiotic, blood cultures are sent. Patient cannot give urine sample as he does not produce urine. Chest x-ray is negative.   There is no confusion. No active skin infections on my examination.   Infectious disease and Rheumatology consult.  * ESRD on HD   Nephro consult.  * Hypotension   Hold meds, monitor.  * Autoimmune skin disorder   We will call hematology consult as this may be all his reaction unknown infection.  * Elevated bilirubin   We will do right upper quadrant ultrasound study.  All the records are reviewed and case discussed with ED provider. Management plans discussed with the patient, family and they are in agreement.  CODE STATUS: Full code Code Status History    This patient does not have a recorded code status. Please  follow your organizational policy for patients in this situation.    Advance Directive Documentation     Most Recent Value  Type of Advance Directive  Out of facility DNR (pink MOST or yellow form), Living will, Healthcare Power of Attorney  Pre-existing out of facility DNR order (yellow form or pink MOST form)  -  "MOST" Form in Place?  -     Patient's daughter and wife present in the room.  TOTAL TIME TAKING CARE OF THIS PATIENT: 50 minutes.    Vaughan Basta M.D on 07/12/2016   Between 7am to 6pm - Pager - [phone]  After 6pm go to [email or web address] - password [email or web address] [facility]  Office  [phone]  CC: Primary care physician; Raelyn Mora, MD   Note: This dictation was prepared with Dragon dictation along with smaller phrase technology. Any transcriptional errors that result from this process are unintentional.

## 2016-07-12 NOTE — ED Notes (Signed)
Called XR. XR informed this Probation officer that pt had been transported to [place].

## 2016-07-12 NOTE — ED Notes (Signed)
Pt placed in trendelenburg position.

## 2016-07-12 NOTE — ED Notes (Signed)
Attempted to call report a second time

## 2016-07-12 NOTE — ED Provider Notes (Addendum)
[facility] Emergency Department Provider Note  ____________________________________________  Time seen: Approximately 6:04 PM  I have reviewed the triage vital signs and the nursing notes.   HISTORY  Chief Complaint Fatigue and Weakness    HPI William Carpenter is a 61 y.o. male w/ a hx of ESRD on HD, recent cspine fusion w/ anterior approach, presenting w/ fatigue.  Pt and his daughter report that pt has been falling asleep throughout the day for the past two days.  Throughout the day, the patient's daughter has been checking his blood pressure and it has progressively been trending downwards. The patient has had a mild cough and decreased oral intake, but denies any fevers, chills, chest pain, shortness of breath. He has not had any abdominal pain or diarrhea. He does not urinate. He has left upper extremity edema, which is new. The patient has a wound over the left elbow which has been draining some purulent material. The patient underwent cervical spine surgery with anterior approach several weeks ago, followed by rehabilitation stay at [facility], and discharged home. At [facility], the patient's medications were reevaluated and changed, including blood pressure medications. In addition, the wound over his left elbow was not treated and worsened while he was there. Yesterday, the patient also took his wife's dementia medications, but no blood pressure medications.  No confusion, HA, n/v, numbness, tingling or focal weakness.  Past Medical History:  Diagnosis Date  . Dysphagia   . Hyperlipidemia   . Muscle weakness (generalized)   . Renal disorder    end stage   . Spinal stenosis, cervical region     There are no active problems to display for this patient.   Past Surgical History:  Procedure Laterality Date  . SPINAL FUSION  2018      Allergies Albumin (human); Aloe; Iodinated diagnostic agents; Iron dextran; Metoprolol tartrate; Penicillins;  Quinine sulfate [quinine]; Rabeprazole; Sulfa antibiotics; and Trimethoprim  No family history on file.  Social History Social History  Substance Use Topics  . Smoking status: Never Smoker  . Smokeless tobacco: Never Used  . Alcohol use Not on file    Review of Systems Constitutional: No fever/chills.Positive generalized weakness. Positive falling asleep throughout the day. Eyes: No visual changes. No blurred or double vision. ENT: No sore throat. No congestion or rhinorrhea. Cardiovascular: Denies chest pain. Denies palpitations. Respiratory: Denies shortness of breath.  Positive mild cough. Gastrointestinal: No abdominal pain.  No nausea, no vomiting.  No diarrhea.  No constipation. Genitourinary: N does not make urine Musculoskeletal: Negative for back pain. Positive swelling of the left upper extremity. Skin: Positive for abrasion over the left upper extremity elbow that has had some purulent discharge. Neurological: Negative for headaches. No focal numbness, tingling or weakness.     ____________________________________________   PHYSICAL EXAM:  VITAL SIGNS: ED Triage Vitals  Enc Vitals Group     BP 07/12/16 1733 98/61     Pulse Rate 07/12/16 1733 (!) 57     Resp 07/12/16 1733 15     Temp 07/12/16 1733 97.9 F (36.6 C)     Temp Source 07/12/16 1733 Oral     SpO2 07/12/16 1733 96 %     Weight 07/12/16 1736 168 lb (76.2 kg)     Height 07/12/16 1736 5\' 10"  (1.778 m)     Head Circumference --      [facility] --      Pain Score 07/12/16 1729 0     Pain  Loc --      Pain Edu? --      Excl. in [place]? --     Constitutional: Alert and oriented x 3. Chronically ill appearing and in no acute distress. Answers questions appropriately. Eyes: Conjunctivae are normal.  EOMI. No scleral icterus. No eye discharge. Head: Atraumatic. Nose: No congestion/rhinnorhea. Mouth/Throat: Mucous membranes are mildly dry. Poor dentition..  Neck: No stridor.  Supple.  No JVD. No meningismus.  The patient has a 3 inch well-healed surgical incision on the left lower neck but does not have any discharge, fluctuance, or overlying erythema. Cardiovascular: Normal rate, regular rhythm. No murmurs, rubs or gallops.  Respiratory: Normal respiratory effort.  No accessory muscle use or retractions. Lungs CTAB.  No wheezes, rales or ronchi. Gastrointestinal: Soft, nontender and nondistended.  No guarding or rebound.  No peritoneal signs. Musculoskeletal: No LE edema. No ttp in the calves or palpable cords.  Negative Homan's sign. 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 Neurologic:  A&Ox3.  Speech is clear.  Face and smile are symmetric.  EOMI.  Moves all extremities well. Skin:  Skin is warm, dry. The patient has a 2 x 3 cm abrasion over the left elbow that has a yellowish scab but no surrounding erythema, scab or active discharge. Psychiatric: Mood and affect are normal. Speech and behavior are normal.  Normal judgement.  ____________________________________________   LABS (all labs ordered are listed, but only abnormal results are displayed)  Labs Reviewed  BASIC METABOLIC PANEL - Abnormal; Notable for the following:       Result Value   Chloride 96 (*)    Glucose, Bld 123 (*)    BUN 54 (*)    Creatinine, Ser 7.16 (*)    GFR calc non Af Amer 7 (*)    GFR calc Af Amer 9 (*)    All other components within normal limits  CBC - Abnormal; Notable for the following:    WBC 23.4 (*)    RBC 3.02 (*)    Hemoglobin 10.4 (*)    HCT 31.3 (*)    MCV 103.6 (*)    MCH 34.5 (*)    RDW 15.2 (*)    All other components within normal limits  CULTURE, BLOOD (ROUTINE X 2)  CULTURE, BLOOD (ROUTINE X 2)  URINALYSIS, COMPLETE (UACMP) WITH MICROSCOPIC  LACTIC ACID, PLASMA  LACTIC ACID, PLASMA  BLOOD GAS, VENOUS  TROPONIN I  HEPATIC FUNCTION PANEL  CBG MONITORING, ED   ____________________________________________  EKG  ED ECG REPORT I, Eula Listen, the attending physician, personally  viewed and interpreted this ECG.   Date: 07/12/2016  EKG Time: 1734  Rate: 58  Rhythm: sinus bradycardia  Axis: normal  Intervals:none  ST&T Change: No STEMI  ____________________________________________  RADIOLOGY  Dg Chest 2 View  Result Date: 07/12/2016 CLINICAL DATA:  Generalized weakness, fatigue, and occasional cough EXAM: CHEST  2 VIEW COMPARISON:  05/04/2012 FINDINGS: Normal heart size, mediastinal contours, and pulmonary vascularity. Atherosclerotic calcification aorta. Increased bronchitic changes since prior study. Increased interstitial markings since previous exam, though slightly accentuated previously as well, question mild chronic interstitial lung disease with progression. No segmental consolidation, pleural effusion or pneumothorax. Interval cervical spine fusion. Perigastric surgical clips. Slightly sclerotic appearance of the spine is unchanged could be related to patient's history of end-stage renal disease per EMR. IMPRESSION: Increased bronchitic interstitial changes since previous exam question increased bronchitis versus progression of chronic interstitial lung disease. Aortic Atherosclerosis (ICD10-I70.0). Electronically Signed   By: Elta Guadeloupe  Thornton Papas M.D.   On: 07/12/2016 18:28    ____________________________________________   PROCEDURES  Procedure(s) performed: None  Procedures  Critical Care performed: Yes ____________________________________________   INITIAL IMPRESSION / ASSESSMENT AND PLAN / ED COURSE  Pertinent labs & imaging results that were available during my care of the patient were reviewed by me and considered in my medical decision making (see chart for details).  61 y.o. male with recent C-spine surgery and rehabilitation stay presenting for 2 days of somnolence and generalized weakness. Overall, the patient is bradycardic and hypotensive. This may be due to iatrogenic poly-pharmacy, but the patient does not know his medication list. I'm also  concerned about infection as this patient is high risk. We will get a chest x-ray to evaluate for pneumonia, he does not make urine, also consider bacteremia. The patient's neck is supple and there is no evidence of infection at the incision site, so at this time CT neck is not indicated. I am also concerned about possible wound infection over the left elbow and the patient will be covered for cellulitis as well. The patient will require admission for further evaluation and treatment.  ----------------------------------------- 6:40 PM on 07/12/2016 -----------------------------------------  The patient does have a white blood count of 23.4. His chest x-ray does not show any frank opacity that would be concerning for pneumonia. A pneumonia with radiographic lag is not excluded.  The patient does have a severe contrast dye allergy, and again his neck examination is reassuring, although infection there is always possible given his recent surgery. The patient has received antibiotics and will be admitted for further evaluation and treatment.  CRITICAL CARE Performed by: Eula Listen   Total critical care time: 45 minutes  Critical care time was exclusive of separately billable procedures and treating other patients.  Critical care was necessary to treat or prevent imminent or life-threatening deterioration.  Critical care was time spent personally by me on the following activities: development of treatment plan with patient and/or surrogate as well as nursing, discussions with consultants, evaluation of patient's response to treatment, examination of patient, obtaining history from patient or surrogate, ordering and performing treatments and interventions, ordering and review of laboratory studies, ordering and review of radiographic studies, pulse oximetry and re-evaluation of patient's condition.   ____________________________________________  FINAL CLINICAL IMPRESSION(S) / ED  DIAGNOSES  Final diagnoses:  Somnolence  Leukocytosis, unspecified type  Cough  Sepsis, due to unspecified organism [facility])  Sinus bradycardia         NEW MEDICATIONS STARTED DURING THIS VISIT:  New Prescriptions   No medications on file      Eula Listen, MD 07/12/16 1843    Eula Listen, MD 07/12/16 [phone]

## 2016-07-12 NOTE — ED Notes (Signed)
Patient transported to X-ray 

## 2016-07-12 NOTE — ED Notes (Signed)
Attempted to call report

## 2016-07-12 NOTE — Progress Notes (Addendum)
ANTIBIOTIC CONSULT NOTE - INITIAL  Pharmacy Consult for Vancomycin, Levaquin, Aztreonam  Indication: wound infection  Allergies  Allergen Reactions  . Albumin (Human) Hives  . Aloe   . Iodinated Diagnostic Agents   . Iron Dextran   . Metoprolol Tartrate   . Penicillins Swelling  . Quinine Sulfate [Quinine]   . Rabeprazole   . Sulfa Antibiotics Other (See Comments)  . Trimethoprim     Patient Measurements: Height: 5\' 10"  (177.8 cm) Weight: 168 lb (76.2 kg) IBW/kg (Calculated) : 73 Adjusted Body Weight: 74.3 kg   Vital Signs: Temp: 97.9 F (36.6 C) (07/09 1733) Temp Source: Oral (07/09 1733) BP: 95/63 (07/09 2030) Pulse Rate: 58 (07/09 2030) Intake/Output from previous day: No intake/output data recorded. Intake/Output from this shift: No intake/output data recorded.  Labs:  Recent Labs  07/12/16 1740  WBC 23.4*  HGB 10.4*  PLT 344  CREATININE 7.16*   Estimated Creatinine Clearance: 11.2 mL/min (A) (by C-G formula based on SCr of 7.16 mg/dL (H)). No results for input(s): VANCOTROUGH, VANCOPEAK, VANCORANDOM, GENTTROUGH, GENTPEAK, GENTRANDOM, TOBRATROUGH, TOBRAPEAK, TOBRARND, AMIKACINPEAK, AMIKACINTROU, AMIKACIN in the last 72 hours.   Microbiology: No results found for this or any previous visit (from the past 720 hour(s)).  Medical History: Past Medical History:  Diagnosis Date  . 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 Dysphagia   . FSGS (focal segmental glomerulosclerosis)   . Hyperlipidemia   . Muscle weakness (generalized)   . Renal disorder    end stage   . Spinal stenosis, cervical region     Medications:   (Not in a hospital admission) Assessment: Pharmacy consulted to dose levaquin , vancomycin and aztreonam.  Pt on HD at home, unsure of HD schedule.  Nephro consult ordered.   Goal of Therapy:  Vancomycin trough level 15-20 mcg/ml  Plan:  Expected duration 7 days with resolution of temperature and/or normalization of WBC    Levaquin 750 mg IV X 1 given in ED on 7/9 @ 20:00. Levaquin 250 mg IV Q48H ordered to start on 7/11 @ 18:00.  Aztreonam 2 gm IV X 1 given on 7/9 @ 20:00. Aztreonam 500 mg IV Q12H ordered tos tart on 7/10 @ 10:00.  Vancomycin 1500 mg IV X 1 given on 7/9 @ 20:00. Vancomycin 750 mg IV Q T-Th-Sat HD ordered.  Will draw 1st trough on 7/14 30 minutes before HD session.  Will need to verify HD schedule and adjust dosing and trough accordingly.   Gay Moncivais D 07/12/2016,8:54 PM

## 2016-07-13 ENCOUNTER — Inpatient Hospital Stay: Payer: Medicare Other

## 2016-07-13 LAB — CBC
HCT: 30.4 % — ABNORMAL LOW (ref 40.0–52.0)
Hemoglobin: 10 g/dL — ABNORMAL LOW (ref 13.0–18.0)
MCH: 34.4 pg — ABNORMAL HIGH (ref 26.0–34.0)
MCHC: 32.8 g/dL (ref 32.0–36.0)
MCV: 104.9 fL — AB (ref 80.0–100.0)
PLATELETS: 317 10*3/uL (ref 150–440)
RBC: 2.89 MIL/uL — AB (ref 4.40–5.90)
RDW: 15.2 % — AB (ref 11.5–14.5)
WBC: 18.6 10*3/uL — AB (ref 3.8–10.6)

## 2016-07-13 LAB — BASIC METABOLIC PANEL
Anion gap: 10 (ref 5–15)
BUN: 61 mg/dL — AB (ref 6–20)
CALCIUM: 9.6 mg/dL (ref 8.9–10.3)
CHLORIDE: 99 mmol/L — AB (ref 101–111)
CO2: 28 mmol/L (ref 22–32)
CREATININE: 7.57 mg/dL — AB (ref 0.61–1.24)
GFR calc Af Amer: 8 mL/min — ABNORMAL LOW (ref >60)
GFR, EST NON AFRICAN AMERICAN: 7 mL/min — AB (ref >60)
Glucose, Bld: 102 mg/dL — ABNORMAL HIGH (ref 65–99)
Potassium: 4.3 mmol/L (ref 3.5–5.1)
SODIUM: 137 mmol/L (ref 135–145)

## 2016-07-13 LAB — MRSA PCR SCREENING: MRSA by PCR: NEGATIVE

## 2016-07-13 MED ORDER — LEVOFLOXACIN IN D5W 500 MG/100ML IV SOLN
500.0000 mg | INTRAVENOUS | Status: DC
  Administered 2016-07-14: 500 mg via INTRAVENOUS
  Filled 2016-07-13 (×2): qty 100

## 2016-07-13 NOTE — Progress Notes (Signed)
Venous needle infiltrated after patient bent access arm to scratch his nose.Needle was unable to be readjusted,new venous site cannulated and treatment resumed.Patient tolerated well.Old venous site with quartersize infiltration noted,ice placed on site.

## 2016-07-13 NOTE — Progress Notes (Signed)
Pre-Dialysis assessment. 

## 2016-07-13 NOTE — Consult Note (Signed)
[facility] Infectious Disease     Reason for Consult: Sepsis    Referring Physician: Ferdinand Cava Date of Admission:  07/12/2016   Principal Problem:   Leukocytosis Active Problems:   Hypotension   HPI: William Carpenter is a 61 y.o. male admitted with weakness and fatigue.  Apparently found by Home PT to have BP low in 70s.  He was recently dced from rehab after surgery on C spine. He has a hx of what sounds like bullous pemphigus followed by derm at [facility], dysphagia, focal segmental glomerulosclerosis and end-stage renal disease now on hemodialysis.  Daughter also reports some fevers aand chills at home. Also having some increased confusion. On admission wbc 23  and temp 98.  BP 98/61.  He was cultured and started on IV abx. Seen today during HD.  Past Medical History:  Diagnosis Date  . Autoimmune disease (HCC)    skin problem  . Dysphagia   . FSGS (focal segmental glomerulosclerosis)   . Hyperlipidemia   . Muscle weakness (generalized)   . Renal disorder    end stage   . Spinal stenosis, cervical region    Past Surgical History:  Procedure Laterality Date  . SPINAL FUSION  2018   Social History  Substance Use Topics  . Smoking status: Never Smoker  . Smokeless tobacco: Never Used  . Alcohol use No   Family History  Problem Relation Age of Onset  . Hypertension Mother     Allergies:  Allergies  Allergen Reactions  . Albumin (Human) Hives  . Aloe   . Iodinated Diagnostic Agents   . Iron Dextran   . Metoprolol Tartrate   . Penicillins Swelling  . Quinine Sulfate [Quinine]   . Rabeprazole   . Sulfa Antibiotics Other (See Comments)  . Trimethoprim     Current antibiotics: Antibiotics Given (last 72 hours)    Date/Time Action Medication Dose Rate   07/12/16 1943 New Bag/Given   levofloxacin (LEVAQUIN) IVPB 750 mg 750 mg 100 mL/hr   07/12/16 1949 New Bag/Given   aztreonam (AZACTAM) 2 g in dextrose 5 % 50 mL IVPB 2 g 100 mL/hr   07/12/16 2019 New Bag/Given    vancomycin (VANCOCIN) 1,500 mg in sodium chloride 0.9 % 500 mL IVPB 1,500 mg 250 mL/hr   07/13/16 1000 New Bag/Given   aztreonam (AZACTAM) 500 mg in dextrose 5 % 50 mL IVPB 500 mg 100 mL/hr      MEDICATIONS: . acetaminophen  975 mg Oral Q8H  . atorvastatin  20 mg Oral Daily  . calcium acetate  667 mg Oral TID WC  . cinacalcet  90 mg Oral Daily  . gabapentin  100 mg Oral QHS  . heparin  5,000 Units Subcutaneous Q8H  . loratadine  10 mg Oral Daily  . methocarbamol  500 mg Oral Q12H  . multivitamin  1 tablet Oral QHS  . naproxen  500 mg Oral BID WC  . pantoprazole  40 mg Oral Daily  . senna-docusate  1 tablet Oral BID  . sertraline  150 mg Oral QHS  . simethicone  80 mg Oral TID  . timolol  1 drop Both Eyes Daily  . traZODone  100 mg Oral QHS    Review of Systems - unable to obtain   OBJECTIVE: Temp:  [97.5 F (36.4 C)-97.9 F (36.6 C)] 97.5 F (36.4 C) (07/09 2152) Pulse Rate:  [57-67] 63 (07/10 0024) Resp:  [13-20] (P) 18 (07/10 1400) BP: (85-107)/(57-71) 92/59 (07/10 0024)  SpO2:  [94 %-98 %] (P) 96 % (07/10 1400) Weight:  [76.2 kg (168 lb)] 76.2 kg (168 lb) (07/09 1736) Physical Exam  Constitutional: He is oriented to person, place, and time. He appears well-developed and well-nourished. No distress.  HENT:  Mouth/Throat: Oropharynx is clear and moist. No oropharyngeal exudate.  Cardiovascular: Normal rate, regular rhythm and normal heart sounds.  HD  acf RUE wnl - currently accessed Pulmonary/Chest: Effort normal and breath sounds normal. No respiratory distress. He has no wheezes.  Abdominal: Soft. Bowel sounds are normal. He exhibits no distension. There is no tenderness.  Lymphadenopathy: He has no cervical adenopathy.  Neurological: He is awake but slowed mentation .  Skin: a few scabs on arms and leg. Ant neck incision site wnl  Psychiatric: He has a normal mood and affect. His behavior is normal.     LABS: Results for orders placed or performed during  the hospital encounter of 07/12/16 (from the past 48 hour(s))  Basic metabolic panel     Status: Abnormal   Collection Time: 07/12/16  5:40 PM  Result Value Ref Range   Sodium 136 135 - 145 mmol/L   Potassium 3.8 3.5 - 5.1 mmol/L   Chloride 96 (L) 101 - 111 mmol/L   CO2 27 22 - 32 mmol/L   Glucose, Bld 123 (H) 65 - 99 mg/dL   BUN 54 (H) 6 - 20 mg/dL   Creatinine, Ser 7.16 (H) 0.61 - 1.24 mg/dL   Calcium 9.5 8.9 - 10.3 mg/dL   GFR calc non Af Amer 7 (L) >60 mL/min   GFR calc Af Amer 9 (L) >60 mL/min    Comment: (NOTE) The eGFR has been calculated using the CKD EPI equation. This calculation has not been validated in all clinical situations. eGFR's persistently <60 mL/min signify possible Chronic Kidney Disease.    Anion gap 13 5 - 15  CBC     Status: Abnormal   Collection Time: 07/12/16  5:40 PM  Result Value Ref Range   WBC 23.4 (H) 3.8 - 10.6 K/uL   RBC 3.02 (L) 4.40 - 5.90 MIL/uL   Hemoglobin 10.4 (L) 13.0 - 18.0 g/dL   HCT 31.3 (L) 40.0 - 52.0 %   MCV 103.6 (H) 80.0 - 100.0 fL   MCH 34.5 (H) 26.0 - 34.0 pg   MCHC 33.3 32.0 - 36.0 g/dL   RDW 15.2 (H) 11.5 - 14.5 %   Platelets 344 150 - 440 K/uL  Troponin I     Status: Abnormal   Collection Time: 07/12/16  5:40 PM  Result Value Ref Range   Troponin I 0.03 (HH) <0.03 ng/mL    Comment: CRITICAL RESULT CALLED TO, READ BACK BY AND VERIFIED WITH NELLEY MONAR AT 1908 07/12/2016 BY TFK.   Hepatic function panel     Status: Abnormal   Collection Time: 07/12/16  5:40 PM  Result Value Ref Range   Total Protein 7.6 6.5 - 8.1 g/dL   Albumin 2.5 (L) 3.5 - 5.0 g/dL   AST 75 (H) 15 - 41 U/L   ALT 47 17 - 63 U/L   Alkaline Phosphatase 205 (H) 38 - 126 U/L   Total Bilirubin 1.9 (H) 0.3 - 1.2 mg/dL   Bilirubin, Direct 0.2 0.1 - 0.5 mg/dL   Indirect Bilirubin 1.7 (H) 0.3 - 0.9 mg/dL  Differential     Status: Abnormal   Collection Time: 07/12/16  5:40 PM  Result Value Ref Range   Neutrophils Relative % 80 %  Neutro Abs 18.4 (H)  1.4 - 6.5 K/uL   Lymphocytes Relative 7 %   Lymphs Abs 1.5 1.0 - 3.6 K/uL   Monocytes Relative 5 %   Monocytes Absolute 1.1 (H) 0.2 - 1.0 K/uL   Eosinophils Relative 7 %   Eosinophils Absolute 1.6 (H) 0 - 0.7 K/uL   Basophils Relative 1 %   Basophils Absolute 0.2 (H) 0 - 0.1 K/uL  Blood Culture (routine x 2)     Status: None (Preliminary result)   Collection Time: 07/12/16  6:29 PM  Result Value Ref Range   Specimen Description BLOOD BLOOD RIGHT FOREARM    Special Requests      BOTTLES DRAWN AEROBIC AND ANAEROBIC Blood Culture adequate volume   Culture NO GROWTH < 12 HOURS    Report Status PENDING   Blood Culture (routine x 2)     Status: None (Preliminary result)   Collection Time: 07/12/16  6:29 PM  Result Value Ref Range   Specimen Description BLOOD RIGHT ANTECUBITAL    Special Requests      BOTTLES DRAWN AEROBIC AND ANAEROBIC Blood Culture adequate volume   Culture NO GROWTH < 12 HOURS    Report Status PENDING   Lactic acid, plasma     Status: None   Collection Time: 07/12/16  6:29 PM  Result Value Ref Range   Lactic Acid, Venous 1.4 0.5 - 1.9 mmol/L  Blood gas, venous ([facility], [facility], [facility])     Status: Abnormal (Preliminary result)   Collection Time: 07/12/16  6:29 PM  Result Value Ref Range   pH, Ven 7.27 7.250 - 7.430   pCO2, Ven 67 (H) 44.0 - 60.0 mmHg   pO2, Ven PENDING 32.0 - 45.0 mmHg   Bicarbonate 30.8 (H) 20.0 - 28.0 mmol/L   Acid-Base Excess 1.9 0.0 - 2.0 mmol/L   O2 Saturation PENDING %   Patient temperature 37.0    Collection site VENOUS    Sample type VENOUS   MRSA PCR Screening     Status: None   Collection Time: 07/13/16  3:32 AM  Result Value Ref Range   MRSA by PCR NEGATIVE NEGATIVE    Comment:        The GeneXpert MRSA Assay (FDA approved for NASAL specimens only), is one component of a comprehensive MRSA colonization surveillance program. It is not intended to diagnose MRSA infection nor to guide or monitor treatment for MRSA infections.   Basic  metabolic panel     Status: Abnormal   Collection Time: 07/13/16  4:00 AM  Result Value Ref Range   Sodium 137 135 - 145 mmol/L   Potassium 4.3 3.5 - 5.1 mmol/L   Chloride 99 (L) 101 - 111 mmol/L   CO2 28 22 - 32 mmol/L   Glucose, Bld 102 (H) 65 - 99 mg/dL   BUN 61 (H) 6 - 20 mg/dL   Creatinine, Ser 3.57 (H) 0.61 - 1.24 mg/dL   Calcium 9.6 8.9 - 50.2 mg/dL   GFR calc non Af Amer 7 (L) >60 mL/min   GFR calc Af Amer 8 (L) >60 mL/min    Comment: (NOTE) The eGFR has been calculated using the CKD EPI equation. This calculation has not been validated in all clinical situations. eGFR's persistently <60 mL/min signify possible Chronic Kidney Disease.    Anion gap 10 5 - 15  CBC     Status: Abnormal   Collection Time: 07/13/16  4:00 AM  Result Value Ref Range   WBC  18.6 (H) 3.8 - 10.6 K/uL   RBC 2.89 (L) 4.40 - 5.90 MIL/uL   Hemoglobin 10.0 (L) 13.0 - 18.0 g/dL   HCT 30.4 (L) 40.0 - 52.0 %   MCV 104.9 (H) 80.0 - 100.0 fL   MCH 34.4 (H) 26.0 - 34.0 pg   MCHC 32.8 32.0 - 36.0 g/dL   RDW 15.2 (H) 11.5 - 14.5 %   Platelets 317 150 - 440 K/uL   No components found for: ESR, C REACTIVE PROTEIN MICRO: Recent Results (from the past 720 hour(s))  Blood Culture (routine x 2)     Status: None (Preliminary result)   Collection Time: 07/12/16  6:29 PM  Result Value Ref Range Status   Specimen Description BLOOD BLOOD RIGHT FOREARM  Final   Special Requests   Final    BOTTLES DRAWN AEROBIC AND ANAEROBIC Blood Culture adequate volume   Culture NO GROWTH < 12 HOURS  Final   Report Status PENDING  Incomplete  Blood Culture (routine x 2)     Status: None (Preliminary result)   Collection Time: 07/12/16  6:29 PM  Result Value Ref Range Status   Specimen Description BLOOD RIGHT ANTECUBITAL  Final   Special Requests   Final    BOTTLES DRAWN AEROBIC AND ANAEROBIC Blood Culture adequate volume   Culture NO GROWTH < 12 HOURS  Final   Report Status PENDING  Incomplete  MRSA PCR Screening     Status:  None   Collection Time: 07/13/16  3:32 AM  Result Value Ref Range Status   MRSA by PCR NEGATIVE NEGATIVE Final    Comment:        The GeneXpert MRSA Assay (FDA approved for NASAL specimens only), is one component of a comprehensive MRSA colonization surveillance program. It is not intended to diagnose MRSA infection nor to guide or monitor treatment for MRSA infections.     IMAGING: Dg Chest 2 View  Result Date: 07/12/2016 CLINICAL DATA:  Generalized weakness, fatigue, and occasional cough EXAM: CHEST  2 VIEW COMPARISON:  05/04/2012 FINDINGS: Normal heart size, mediastinal contours, and pulmonary vascularity. Atherosclerotic calcification aorta. Increased bronchitic changes since prior study. Increased interstitial markings since previous exam, though slightly accentuated previously as well, question mild chronic interstitial lung disease with progression. No segmental consolidation, pleural effusion or pneumothorax. Interval cervical spine fusion. Perigastric surgical clips. Slightly sclerotic appearance of the spine is unchanged could be related to patient's history of end-stage renal disease per EMR. IMPRESSION: Increased bronchitic interstitial changes since previous exam question increased bronchitis versus progression of chronic interstitial lung disease. Aortic Atherosclerosis (ICD10-I70.0). Electronically Signed   By: Lavonia Dana M.D.   On: 07/12/2016 18:28   US Venous Img Upper Uni Left  Result Date: 07/12/2016 CLINICAL DATA:  Left upper extremity swelling. Patient with history of left upper extremity fistula for dialysis for decades. Swelling for 1 week. EXAM: LEFT UPPER EXTREMITY VENOUS DOPPLER ULTRASOUND TECHNIQUE: Gray-scale sonography with graded compression, as well as color Doppler and duplex ultrasound were performed to evaluate the upper extremity deep venous system from the level of the subclavian vein and including the jugular, axillary, basilic, radial, ulnar and upper  cephalic vein. Spectral Doppler was utilized to evaluate flow at rest and with distal augmentation maneuvers. COMPARISON:  None. FINDINGS: Contralateral Subclavian Vein: Respiratory phasicity is normal and symmetric with the symptomatic side. No evidence of thrombus. Normal compressibility. Internal Jugular Vein: No evidence of thrombus. Normal compressibility, respiratory phasicity and response to augmentation. Subclavian Vein: No  evidence of thrombus. Normal compressibility, respiratory phasicity and response to augmentation. Axillary Vein: No evidence of thrombus. Normal compressibility, respiratory phasicity and response to augmentation. Cephalic Vein: There is eccentric nonocclusive thrombus/ venous wall thickening in the cephalic vein. Basilic Vein: No evidence of thrombus. Normal compressibility, respiratory phasicity and response to augmentation. Brachial Veins: No evidence of thrombus. Normal compressibility, respiratory phasicity and response to augmentation. Radial Veins: No evidence of thrombus. Normal compressibility, respiratory phasicity and response to augmentation. Ulnar Veins: No evidence of thrombus. Normal compressibility, respiratory phasicity and response to augmentation. Other Findings: Limited interrogation at the patient's dialysis fistula demonstrated patent flow in the upper arm anastomosis. There is an avascular fluid collection labeled left shoulder at that measures 4.6 x 1.5 x 4.6 cm. IMPRESSION: 1. No evidence of DVT within the left upper extremity. 2. Chronic appearing nonocclusive thrombus within the cephalic vein. 3. Left upper extremity dialysis fistula which is grossly patent, detailed interrogation not performed. 4. Fluid collection about the left shoulder measuring 4.6 cm, may be liquefying hematoma. No surrounding hyperemia or internal complexity to suggest superimposed infection. Electronically Signed   By: Jeb Levering M.D.   On: 07/12/2016 20:00    Assessment:    William Carpenter is a 61 y.o. male admitted with weakness, fatigue and confusion a week after dc from Rehab following C spine surgery a month ago at the [place]. 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 His CXR shows chronic changes, Doppler shows chronic clot UE, bcx pending. No obvious source of his confusion, hypotension and leukocytosis but seems to be improving. Daugther seems overwhelmed caring for him.   Recommendations Await bcx Continue vanco and levo pending culture results  Thank you very much for allowing me to participate in the care of this patient. Please call with questions.   Cheral Marker. Ola Spurr, MD

## 2016-07-13 NOTE — Progress Notes (Signed)
Pre dialysis  

## 2016-07-13 NOTE — Progress Notes (Signed)
Post hemodialysis 

## 2016-07-13 NOTE — Progress Notes (Signed)
HD initiated without issue via L AVF. No UF as ordered per MD. Also no heaprin treatment. Patient currently resting quietly without complaints.

## 2016-07-13 NOTE — Progress Notes (Signed)
[facility], [place] 07/13/16  Subjective:   Patient is known to our practice from outpatient dialysis.  He presents for symptomatic low blood pressure.  He is currently a resident at [facility].  Upon presentation he reported fatigue and generalized weakness.  No shortness of breath. Presenting blood pressure was 98/61, 85/57 At present, patient sitting in the chair.  States that he is n.p.o. For an ultrasound otherwise no nausea or vomiting  Objective:  Vital signs in last 24 hours:  Temp:  [97.5 F (36.4 C)-97.9 F (36.6 C)] 97.7 F (36.5 C) (07/10 1400) Pulse Rate:  [57-74] 66 (07/10 1411) Resp:  [13-20] 19 (07/10 1411) BP: (85-129)/(57-78) 129/73 (07/10 1411) SpO2:  [94 %-98 %] 98 % (07/10 1411) Weight:  [76.2 kg (168 lb)-77.5 kg (170 lb 13.7 oz)] 77.5 kg (170 lb 13.7 oz) (07/10 1400)  Weight change:  Filed Weights   07/12/16 1736 07/13/16 1400  Weight: 76.2 kg (168 lb) 77.5 kg (170 lb 13.7 oz)    Intake/Output:    Intake/Output Summary (Last 24 hours) at 07/13/16 1424 Last data filed at 07/13/16 1200  Gross per 24 hour  Intake              480 ml  Output                0 ml  Net              480 ml     Physical Exam: General: No acute distress, sitting up in the chair  HEENT Anicteric, moist oral mucous membranes  Neck supple  Pulm/lungs Normal effort, clear to auscultation  CVS/Heart No rub or gallop  Abdomen:  Soft, nontender, nondistended  Extremities: No edema  Neurologic: Alert, oriented     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       Basic Metabolic Panel:   Recent Labs Lab 07/12/16 1740 07/13/16 0400  NA 136 137  K 3.8 4.3  CL 96* 99*  CO2 27 28  GLUCOSE 123* 102*  BUN 54* 61*  CREATININE 7.16* 7.57*  CALCIUM 9.5 9.6     CBC:  Recent Labs Lab 07/12/16 1740 07/13/16 0400  WBC 23.4* 18.6*  NEUTROABS 18.4*  --   HGB 10.4* 10.0*  HCT 31.3* 30.4*  MCV 103.6* 104.9*  PLT  344 317     No results found for: HEPBSAG, HEPBSAB, HEPBIGM    Microbiology:  Recent Results (from the past 240 hour(s))  Blood Culture (routine x 2)     Status: None (Preliminary result)   Collection Time: 07/12/16  6:29 PM  Result Value Ref Range Status   Specimen Description BLOOD BLOOD RIGHT FOREARM  Final   Special Requests   Final    BOTTLES DRAWN AEROBIC AND ANAEROBIC Blood Culture adequate volume   Culture NO GROWTH < 12 HOURS  Final   Report Status PENDING  Incomplete  Blood Culture (routine x 2)     Status: None (Preliminary result)   Collection Time: 07/12/16  6:29 PM  Result Value Ref Range Status   Specimen Description BLOOD RIGHT ANTECUBITAL  Final   Special Requests   Final    BOTTLES DRAWN AEROBIC AND ANAEROBIC Blood Culture adequate volume   Culture NO GROWTH < 12 HOURS  Final   Report Status PENDING  Incomplete  MRSA PCR Screening     Status: None   Collection Time: 07/13/16  3:32 AM  Result Value Ref Range  Status   MRSA by PCR NEGATIVE NEGATIVE Final    Comment:        The GeneXpert MRSA Assay (FDA approved for NASAL specimens only), is one component of a comprehensive MRSA colonization surveillance program. It is not intended to diagnose MRSA infection nor to guide or monitor treatment for MRSA infections.     Coagulation Studies: No results for input(s): LABPROT, INR in the last 72 hours.  Urinalysis: No results for input(s): COLORURINE, LABSPEC, PHURINE, GLUCOSEU, HGBUR, BILIRUBINUR, KETONESUR, PROTEINUR, UROBILINOGEN, NITRITE, LEUKOCYTESUR in the last 72 hours.  Invalid input(s): APPERANCEUR    Imaging: Dg Chest 2 View  Result Date: 07/12/2016 CLINICAL DATA:  Generalized weakness, fatigue, and occasional cough EXAM: CHEST  2 VIEW COMPARISON:  05/04/2012 FINDINGS: Normal heart size, mediastinal contours, and pulmonary vascularity. Atherosclerotic calcification aorta. Increased bronchitic changes since prior study. Increased interstitial  markings since previous exam, though slightly accentuated previously as well, question mild chronic interstitial lung disease with progression. No segmental consolidation, pleural effusion or pneumothorax. Interval cervical spine fusion. Perigastric surgical clips. Slightly sclerotic appearance of the spine is unchanged could be related to patient's history of end-stage renal disease per EMR. IMPRESSION: Increased bronchitic interstitial changes since previous exam question increased bronchitis versus progression of chronic interstitial lung disease. Aortic Atherosclerosis (ICD10-I70.0). Electronically Signed   By: Lavonia Dana M.D.   On: 07/12/2016 18:28   US Venous Img Upper Uni Left  Result Date: 07/12/2016 CLINICAL DATA:  Left upper extremity swelling. Patient with history of left upper extremity fistula for dialysis for decades. Swelling for 1 week. EXAM: LEFT UPPER EXTREMITY VENOUS DOPPLER ULTRASOUND TECHNIQUE: Gray-scale sonography with graded compression, as well as color Doppler and duplex ultrasound were performed to evaluate the upper extremity deep venous system from the level of the subclavian vein and including the jugular, axillary, basilic, radial, ulnar and upper cephalic vein. Spectral Doppler was utilized to evaluate flow at rest and with distal augmentation maneuvers. COMPARISON:  None. FINDINGS: Contralateral Subclavian Vein: Respiratory phasicity is normal and symmetric with the symptomatic side. No evidence of thrombus. Normal compressibility. Internal Jugular Vein: No evidence of thrombus. Normal compressibility, respiratory phasicity and response to augmentation. Subclavian Vein: No evidence of thrombus. Normal compressibility, respiratory phasicity and response to augmentation. Axillary Vein: No evidence of thrombus. Normal compressibility, respiratory phasicity and response to augmentation. Cephalic Vein: There is eccentric nonocclusive thrombus/ venous wall thickening in the cephalic  vein. Basilic Vein: No evidence of thrombus. Normal compressibility, respiratory phasicity and response to augmentation. Brachial Veins: No evidence of thrombus. Normal compressibility, respiratory phasicity and response to augmentation. Radial Veins: No evidence of thrombus. Normal compressibility, respiratory phasicity and response to augmentation. Ulnar Veins: No evidence of thrombus. Normal compressibility, respiratory phasicity and response to augmentation. Other Findings: Limited interrogation at the patient's dialysis fistula demonstrated patent flow in the upper arm anastomosis. There is an avascular fluid collection labeled left shoulder at that measures 4.6 x 1.5 x 4.6 cm. IMPRESSION: 1. No evidence of DVT within the left upper extremity. 2. Chronic appearing nonocclusive thrombus within the cephalic vein. 3. Left upper extremity dialysis fistula which is grossly patent, detailed interrogation not performed. 4. Fluid collection about the left shoulder measuring 4.6 cm, may be liquefying hematoma. No surrounding hyperemia or internal complexity to suggest superimposed infection. Electronically Signed   By: Jeb Levering M.D.   On: 07/12/2016 20:00     Medications:   . aztreonam Stopped (07/13/16 1030)  . [START ON 07/14/2016] levofloxacin (LEVAQUIN) IV    .  vancomycin     . acetaminophen  975 mg Oral Q8H  . atorvastatin  20 mg Oral Daily  . calcium acetate  667 mg Oral TID WC  . cinacalcet  90 mg Oral Daily  . gabapentin  100 mg Oral QHS  . heparin  5,000 Units Subcutaneous Q8H  . loratadine  10 mg Oral Daily  . methocarbamol  500 mg Oral Q12H  . multivitamin  1 tablet Oral QHS  . naproxen  500 mg Oral BID WC  . pantoprazole  40 mg Oral Daily  . senna-docusate  1 tablet Oral BID  . sertraline  150 mg Oral QHS  . simethicone  80 mg Oral TID  . timolol  1 drop Both Eyes Daily  . traZODone  100 mg Oral QHS   bisacodyl, docusate sodium, oxyCODONE  Assessment/ Plan:  61 y.o.  caucasian male with ESRD, dialysis for 28 years, during the patient, recent cervical decompression, autoimmune skin disease presents for hypotension  [facility], [facility], [facility]  1.  ESRD Today's patient's normal hemodialysis day.  We will continue his hemodialysis.  No fluid to be removed.  2.  Hypotension - agree with holding hold antihypertensives- clonidine, carvedilol - Consider midodrine if BP remains low  3. Secondary hyperparathyroidism continue home dose of calcium acetate  4. Anemia of CKD - EPO with HD     LOS: 1 [facility] 7/10/20182:24 PM  [facility] [place], [place]

## 2016-07-13 NOTE — Progress Notes (Signed)
[facility] NAME: William Carpenter    MR#:  [id number]  DATE OF BIRTH:  1955/05/11  SUBJECTIVE:  CHIEF COMPLAINT:   Chief Complaint  Patient presents with  . Fatigue  . Weakness   Weakness. REVIEW OF SYSTEMS:  Review of Systems  Constitutional: Positive for malaise/fatigue. Negative for chills and fever.  HENT: Negative for sore throat.   Eyes: Negative for blurred vision and double vision.  Respiratory: Negative for cough, shortness of breath and stridor.   Cardiovascular: Negative for chest pain and leg swelling.  Gastrointestinal: Negative for abdominal pain, blood in stool, diarrhea, melena, nausea and vomiting.  Genitourinary: Negative for dysuria and hematuria.  Musculoskeletal: Negative for back pain.  Skin: Negative for itching and rash.  Neurological: Positive for weakness. Negative for dizziness, focal weakness, loss of consciousness and headaches.  Psychiatric/Behavioral: Negative for depression. The patient is not nervous/anxious.     DRUG ALLERGIES:   Allergies  Allergen Reactions  . Albumin (Human) Hives  . Aloe   . Iodinated Diagnostic Agents   . Iron Dextran   . Metoprolol Tartrate   . Penicillins Swelling  . Quinine Sulfate [Quinine]   . Rabeprazole   . Sulfa Antibiotics Other (See Comments)  . Trimethoprim    VITALS:  Blood pressure 139/74, pulse 66, temperature 97.7 F (36.5 C), temperature source Oral, resp. rate 12, height 5\' 10"  (1.778 m), weight 170 lb 13.7 oz (77.5 kg), SpO2 94 %. PHYSICAL EXAMINATION:  Physical Exam  Constitutional: He is oriented to person, place, and time and well-developed, well-nourished, and in no distress.  HENT:  Head: Normocephalic.  Eyes: Conjunctivae and EOM are normal. No scleral icterus.  Neck: Normal range of motion. Neck supple. No JVD present. No tracheal deviation present.  Cardiovascular: Normal rate and normal heart sounds.  Exam reveals no gallop.     No murmur heard. Pulmonary/Chest: Effort normal and breath sounds normal. No respiratory distress. He has no wheezes. He has no rales.  Abdominal: Soft. Bowel sounds are normal. He exhibits no distension. There is no tenderness.  Musculoskeletal: Normal range of motion. He exhibits no edema or tenderness.  Neurological: He is alert and oriented to person, place, and time. No cranial nerve deficit.  Skin: No rash noted. No erythema.  Psychiatric: Affect normal.   LABORATORY PANEL:  Male CBC  Recent Labs Lab 07/13/16 0400  WBC 18.6*  HGB 10.0*  HCT 30.4*  PLT 317   ------------------------------------------------------------------------------------------------------------------ Chemistries   Recent Labs Lab 07/12/16 1740 07/13/16 0400  NA 136 137  K 3.8 4.3  CL 96* 99*  CO2 27 28  GLUCOSE 123* 102*  BUN 54* 61*  CREATININE 7.16* 7.57*  CALCIUM 9.5 9.6  AST 75*  --   ALT 47  --   ALKPHOS 205*  --   BILITOT 1.9*  --    RADIOLOGY:  Dg Chest 2 View  Result Date: 07/12/2016 CLINICAL DATA:  Generalized weakness, fatigue, and occasional cough EXAM: CHEST  2 VIEW COMPARISON:  05/04/2012 FINDINGS: Normal heart size, mediastinal contours, and pulmonary vascularity. Atherosclerotic calcification aorta. Increased bronchitic changes since prior study. Increased interstitial markings since previous exam, though slightly accentuated previously as well, question mild chronic interstitial lung disease with progression. No segmental consolidation, pleural effusion or pneumothorax. Interval cervical spine fusion. Perigastric surgical clips. Slightly sclerotic appearance of the spine is unchanged could be related to patient's history of end-stage renal disease per EMR. IMPRESSION: Increased bronchitic  interstitial changes since previous exam question increased bronchitis versus progression of chronic interstitial lung disease. Aortic Atherosclerosis (ICD10-I70.0). Electronically Signed   By:  Lavonia Dana M.D.   On: 07/12/2016 18:28   US Venous Img Upper Uni Left  Result Date: 07/12/2016 CLINICAL DATA:  Left upper extremity swelling. Patient with history of left upper extremity fistula for dialysis for decades. Swelling for 1 week. EXAM: LEFT UPPER EXTREMITY VENOUS DOPPLER ULTRASOUND TECHNIQUE: Gray-scale sonography with graded compression, as well as color Doppler and duplex ultrasound were performed to evaluate the upper extremity deep venous system from the level of the subclavian vein and including the jugular, axillary, basilic, radial, ulnar and upper cephalic vein. Spectral Doppler was utilized to evaluate flow at rest and with distal augmentation maneuvers. COMPARISON:  None. FINDINGS: Contralateral Subclavian Vein: Respiratory phasicity is normal and symmetric with the symptomatic side. No evidence of thrombus. Normal compressibility. Internal Jugular Vein: No evidence of thrombus. Normal compressibility, respiratory phasicity and response to augmentation. Subclavian Vein: No evidence of thrombus. Normal compressibility, respiratory phasicity and response to augmentation. Axillary Vein: No evidence of thrombus. Normal compressibility, respiratory phasicity and response to augmentation. Cephalic Vein: There is eccentric nonocclusive thrombus/ venous wall thickening in the cephalic vein. Basilic Vein: No evidence of thrombus. Normal compressibility, respiratory phasicity and response to augmentation. Brachial Veins: No evidence of thrombus. Normal compressibility, respiratory phasicity and response to augmentation. Radial Veins: No evidence of thrombus. Normal compressibility, respiratory phasicity and response to augmentation. Ulnar Veins: No evidence of thrombus. Normal compressibility, respiratory phasicity and response to augmentation. Other Findings: Limited interrogation at the patient's dialysis fistula demonstrated patent flow in the upper arm anastomosis. There is an avascular fluid  collection labeled left shoulder at that measures 4.6 x 1.5 x 4.6 cm. IMPRESSION: 1. No evidence of DVT within the left upper extremity. 2. Chronic appearing nonocclusive thrombus within the cephalic vein. 3. Left upper extremity dialysis fistula which is grossly patent, detailed interrogation not performed. 4. Fluid collection about the left shoulder measuring 4.6 cm, may be liquefying hematoma. No surrounding hyperemia or internal complexity to suggest superimposed infection. Electronically Signed   By: Jeb Levering M.D.   On: 07/12/2016 20:00   ASSESSMENT AND PLAN:   * Leukocytosis   Unknown infection, on broad-spectrum antibiotic, blood cultures are sent. Patient cannot give urine sample as he does not produce urine. Chest x-ray is negative. F/u  CBC, blood culture, Infectious disease and Rheumatology consult.  * ESRD on HD HD today per Dr. Candiss Norse.  * Hypotension   Hold HTN  meds, monitor.  * Autoimmune skin disorder   We will call hematology consult as this may be all his reaction unknown infection.  * Elevated bilirubin  right upper quadrant ultrasound study.  All the records are reviewed and case discussed with Care Management/Social Worker. Management plans discussed with the patient, family and they are in agreement.  CODE STATUS: Full Code  TOTAL TIME TAKING CARE OF THIS PATIENT: 33 minutes.   More than 50% of the time was spent in counseling/coordination of care: YES  POSSIBLE D/C IN 2 DAYS, DEPENDING ON CLINICAL CONDITION.   Demetrios Loll M.D on 07/13/2016 at 3:45 PM  Between 7am to 6pm - Pager - [phone]  After 6pm go to [email or web address] - Proofreader  [facility]  Office  [phone]  CC: Primary care physician; Raelyn Mora, MD  Note: This dictation was prepared with Dragon dictation along with smaller phrase technology. Any transcriptional  errors that result from this process are unintentional.

## 2016-07-13 NOTE — Progress Notes (Signed)
Hemodialysis completed. 

## 2016-07-13 NOTE — Care Management (Signed)
Spoke with patient regarding his [facility] status. He states he is active with the [facility]. He would like to transfer if possible to the [place] for continued care. TC to [employer]. LM for return call.

## 2016-07-14 ENCOUNTER — Inpatient Hospital Stay: Payer: Medicare Other

## 2016-07-14 LAB — COMPREHENSIVE METABOLIC PANEL
ALBUMIN: 2.6 g/dL — AB (ref 3.5–5.0)
ALT: 36 U/L (ref 17–63)
AST: 30 U/L (ref 15–41)
Alkaline Phosphatase: 269 U/L — ABNORMAL HIGH (ref 38–126)
Anion gap: 12 (ref 5–15)
BUN: 32 mg/dL — AB (ref 6–20)
CHLORIDE: 97 mmol/L — AB (ref 101–111)
CO2: 29 mmol/L (ref 22–32)
Calcium: 10.5 mg/dL — ABNORMAL HIGH (ref 8.9–10.3)
Creatinine, Ser: 5.34 mg/dL — ABNORMAL HIGH (ref 0.61–1.24)
GFR calc Af Amer: 12 mL/min — ABNORMAL LOW (ref >60)
GFR calc non Af Amer: 10 mL/min — ABNORMAL LOW (ref >60)
GLUCOSE: 80 mg/dL (ref 65–99)
POTASSIUM: 4.3 mmol/L (ref 3.5–5.1)
Sodium: 138 mmol/L (ref 135–145)
Total Bilirubin: 1.7 mg/dL — ABNORMAL HIGH (ref 0.3–1.2)
Total Protein: 8.5 g/dL — ABNORMAL HIGH (ref 6.5–8.1)

## 2016-07-14 LAB — CBC
HEMATOCRIT: 34 % — AB (ref 40.0–52.0)
Hemoglobin: 11.1 g/dL — ABNORMAL LOW (ref 13.0–18.0)
MCH: 33.4 pg (ref 26.0–34.0)
MCHC: 32.7 g/dL (ref 32.0–36.0)
MCV: 102.3 fL — AB (ref 80.0–100.0)
Platelets: 393 10*3/uL (ref 150–440)
RBC: 3.32 MIL/uL — ABNORMAL LOW (ref 4.40–5.90)
RDW: 15.1 % — AB (ref 11.5–14.5)
WBC: 15.8 10*3/uL — ABNORMAL HIGH (ref 3.8–10.6)

## 2016-07-14 LAB — HIV ANTIBODY (ROUTINE TESTING W REFLEX): HIV SCREEN 4TH GENERATION: NONREACTIVE

## 2016-07-14 LAB — LIPASE, BLOOD: Lipase: 18 U/L (ref 11–51)

## 2016-07-14 MED ORDER — CARVEDILOL 3.125 MG PO TABS: 3.1250 mg | ORAL_TABLET | Freq: Two times a day (BID) | ORAL | Status: DC

## 2016-07-14 MED ORDER — METRONIDAZOLE 500 MG PO TABS
500.0000 mg | ORAL_TABLET | Freq: Three times a day (TID) | ORAL | Status: DC
  Administered 2016-07-14 – 2016-07-17 (×9): 500 mg via ORAL
  Filled 2016-07-14 (×9): qty 1

## 2016-07-14 MED ORDER — CARVEDILOL 3.125 MG PO TABS
3.1250 mg | ORAL_TABLET | Freq: Two times a day (BID) | ORAL | Status: DC
  Administered 2016-07-14 – 2016-07-15 (×3): 3.125 mg via ORAL
  Filled 2016-07-14 (×3): qty 1

## 2016-07-14 MED ORDER — CLONIDINE HCL 0.1 MG PO TABS
0.1000 mg | ORAL_TABLET | Freq: Three times a day (TID) | ORAL | Status: DC
  Administered 2016-07-14 – 2016-07-17 (×10): 0.1 mg via ORAL
  Filled 2016-07-14 (×9): qty 1

## 2016-07-14 MED ORDER — WHITE PETROLATUM GEL
Freq: Every day | Status: DC
Start: 2016-07-14 — End: 2016-07-16
  Administered 2016-07-14: 12:00:00 via TOPICAL
  Filled 2016-07-14: qty 28.35

## 2016-07-14 NOTE — Progress Notes (Signed)
[place] INFECTIOUS DISEASE PROGRESS NOTE Date of Admission:  07/12/2016     ID: William Carpenter is a 61 y.o. male with leukocytosis Principal Problem:   Leukocytosis Active Problems:   Hypotension  Subjective: No fevers, wbc down to 15.8.  Has some abd pain. RUQ USS done  ROS  Eleven systems are reviewed and negative except per hpi  Medications:  Antibiotics Given (last 72 hours)    Date/Time Action Medication Dose Rate   07/12/16 1943 New Bag/Given   levofloxacin (LEVAQUIN) IVPB 750 mg 750 mg 100 mL/hr   07/12/16 1949 New Bag/Given   aztreonam (AZACTAM) 2 g in dextrose 5 % 50 mL IVPB 2 g 100 mL/hr   07/12/16 2019 New Bag/Given   vancomycin (VANCOCIN) 1,500 mg in sodium chloride 0.9 % 500 mL IVPB 1,500 mg 250 mL/hr   07/13/16 1000 New Bag/Given   aztreonam (AZACTAM) 500 mg in dextrose 5 % 50 mL IVPB 500 mg 100 mL/hr   07/13/16 1800 New Bag/Given   vancomycin (VANCOCIN) IVPB 750 mg/150 ml premix 750 mg 150 mL/hr     . acetaminophen  975 mg Oral Q8H  . atorvastatin  20 mg Oral Daily  . calcium acetate  667 mg Oral TID WC  . carvedilol  3.125 mg Oral BID WC  . cinacalcet  90 mg Oral Daily  . cloNIDine  0.1 mg Oral TID  . gabapentin  100 mg Oral QHS  . heparin  5,000 Units Subcutaneous Q8H  . loratadine  10 mg Oral Daily  . methocarbamol  500 mg Oral Q12H  . multivitamin  1 tablet Oral QHS  . naproxen  500 mg Oral BID WC  . pantoprazole  40 mg Oral Daily  . senna-docusate  1 tablet Oral BID  . sertraline  150 mg Oral QHS  . simethicone  80 mg Oral TID  . timolol  1 drop Both Eyes Daily  . traZODone  100 mg Oral QHS  . white petrolatum   Topical Daily    Objective: Vital signs in last 24 hours: Temp:  [97.8 F (36.6 C)] 97.8 F (36.6 C) (07/10 1815) Pulse Rate:  [64-70] 70 (07/10 1815) Resp:  [10-13] 13 (07/10 1815) BP: (96-164)/(66-97) 152/90 (07/11 1145) SpO2:  [93 %-98 %] 96 % (07/10 1815) Weight:  [78 kg (171 lb 15.3 oz)] 78 kg (171 lb 15.3 oz)  (07/10 1815) Constitutional: He is oriented to person, place, and time. He appears well-developed and well-nourished. No distress.  HENT:  Mouth/Throat: Oropharynx is clear and moist. No oropharyngeal exudate.  Cardiovascular: Normal rate, regular rhythm and normal heart sounds.  HD  access RUE wnl - Pulmonary/Chest: Effort normal and breath sounds normal. No respiratory distress. He has no wheezes.  Abdominal: Soft. Bowel sounds are normal. He exhibits no distension. There is midl tenderness.  Lymphadenopathy: He has no cervical adenopathy.  Neurological: He is awake but slowed mentation .  Skin: a few scabs on arms and leg. Ant neck incision site wnl  Psychiatric: He has a normal mood and affect. His behavior is normal.   Lab Results  Recent Labs  07/13/16 0400 07/14/16 1018  WBC 18.6* 15.8*  HGB 10.0* 11.1*  HCT 30.4* 34.0*  NA 137 138  K 4.3 4.3  CL 99* 97*  CO2 28 29  BUN 61* 32*  CREATININE 7.57* 5.34*    Microbiology: Results for orders placed or performed during the hospital encounter of 07/12/16  Blood Culture (routine x 2)  Status: None (Preliminary result)   Collection Time: 07/12/16  6:29 PM  Result Value Ref Range Status   Specimen Description BLOOD BLOOD RIGHT FOREARM  Final   Special Requests   Final    BOTTLES DRAWN AEROBIC AND ANAEROBIC Blood Culture adequate volume   Culture NO GROWTH 2 DAYS  Final   Report Status PENDING  Incomplete  Blood Culture (routine x 2)     Status: None (Preliminary result)   Collection Time: 07/12/16  6:29 PM  Result Value Ref Range Status   Specimen Description BLOOD RIGHT ANTECUBITAL  Final   Special Requests   Final    BOTTLES DRAWN AEROBIC AND ANAEROBIC Blood Culture adequate volume   Culture NO GROWTH 2 DAYS  Final   Report Status PENDING  Incomplete  MRSA PCR Screening     Status: None   Collection Time: 07/13/16  3:32 AM  Result Value Ref Range Status   MRSA by PCR NEGATIVE NEGATIVE Final    Comment:         The GeneXpert MRSA Assay (FDA approved for NASAL specimens only), is one component of a comprehensive MRSA colonization surveillance program. It is not intended to diagnose MRSA infection nor to guide or monitor treatment for MRSA infections.     Studies/Results: Dg Chest 2 View  Result Date: 07/12/2016 CLINICAL DATA:  Generalized weakness, fatigue, and occasional cough EXAM: CHEST  2 VIEW COMPARISON:  05/04/2012 FINDINGS: Normal heart size, mediastinal contours, and pulmonary vascularity. Atherosclerotic calcification aorta. Increased bronchitic changes since prior study. Increased interstitial markings since previous exam, though slightly accentuated previously as well, question mild chronic interstitial lung disease with progression. No segmental consolidation, pleural effusion or pneumothorax. Interval cervical spine fusion. Perigastric surgical clips. Slightly sclerotic appearance of the spine is unchanged could be related to patient's history of end-stage renal disease per EMR. IMPRESSION: Increased bronchitic interstitial changes since previous exam question increased bronchitis versus progression of chronic interstitial lung disease. Aortic Atherosclerosis (ICD10-I70.0). Electronically Signed   By: Lavonia Dana M.D.   On: 07/12/2016 18:28   US Venous Img Upper Uni Left  Result Date: 07/12/2016 CLINICAL DATA:  Left upper extremity swelling. Patient with history of left upper extremity fistula for dialysis for decades. Swelling for 1 week. EXAM: LEFT UPPER EXTREMITY VENOUS DOPPLER ULTRASOUND TECHNIQUE: Gray-scale sonography with graded compression, as well as color Doppler and duplex ultrasound were performed to evaluate the upper extremity deep venous system from the level of the subclavian vein and including the jugular, axillary, basilic, radial, ulnar and upper cephalic vein. Spectral Doppler was utilized to evaluate flow at rest and with distal augmentation maneuvers. COMPARISON:  None.  FINDINGS: Contralateral Subclavian Vein: Respiratory phasicity is normal and symmetric with the symptomatic side. No evidence of thrombus. Normal compressibility. Internal Jugular Vein: No evidence of thrombus. Normal compressibility, respiratory phasicity and response to augmentation. Subclavian Vein: No evidence of thrombus. Normal compressibility, respiratory phasicity and response to augmentation. Axillary Vein: No evidence of thrombus. Normal compressibility, respiratory phasicity and response to augmentation. Cephalic Vein: There is eccentric nonocclusive thrombus/ venous wall thickening in the cephalic vein. Basilic Vein: No evidence of thrombus. Normal compressibility, respiratory phasicity and response to augmentation. Brachial Veins: No evidence of thrombus. Normal compressibility, respiratory phasicity and response to augmentation. Radial Veins: No evidence of thrombus. Normal compressibility, respiratory phasicity and response to augmentation. Ulnar Veins: No evidence of thrombus. Normal compressibility, respiratory phasicity and response to augmentation. Other Findings: Limited interrogation at the patient's dialysis fistula demonstrated patent  flow in the upper arm anastomosis. There is an avascular fluid collection labeled left shoulder at that measures 4.6 x 1.5 x 4.6 cm. IMPRESSION: 1. No evidence of DVT within the left upper extremity. 2. Chronic appearing nonocclusive thrombus within the cephalic vein. 3. Left upper extremity dialysis fistula which is grossly patent, detailed interrogation not performed. 4. Fluid collection about the left shoulder measuring 4.6 cm, may be liquefying hematoma. No surrounding hyperemia or internal complexity to suggest superimposed infection. Electronically Signed   By: Jeb Levering M.D.   On: 07/12/2016 20:00   US Abdomen Limited Ruq  Result Date: 07/14/2016 CLINICAL DATA:  Elevated bilirubin level. EXAM: ULTRASOUND ABDOMEN LIMITED RIGHT UPPER QUADRANT  COMPARISON:  Ultrasound of July 04, 2006. FINDINGS: Gallbladder: No gallstones are noted. Mild amount of pericholecystic fluid is noted. Gallbladder wall thickening is noted measured at 7 mm. Sludge is noted within gallbladder lumen. No sonographic Murphy's sign is noted. Common bile duct: Diameter: 2.7 mm which is within normal limits. Liver: No focal lesion identified. Within normal limits in parenchymal echogenicity. Heterogeneous abnormality seen adjacent to the pancreas. IMPRESSION: Gallbladder wall thickening and pericholecystic fluid is noted, although no cholelithiasis is noted. Sludge is noted within gallbladder lumen. This may represent acalculous cholecystitis, and HIDA scan is recommended for further evaluation. Ill-defined heterogeneous abnormality seen adjacent to pancreatic body. CT scan is recommended for further evaluation. Electronically Signed   By: Marijo Conception, M.D.   On: 07/14/2016 09:30    Assessment/Plan: JORIEL STREETY is a 61 y.o. male admitted with weakness, fatigue and confusion a week after dc from Rehab following C spine surgery a month ago at the [place]. 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 His CXR shows chronic changes, Doppler shows chronic clot UE, bcx pending.  T bili slightly elevated as is alk phos.    Daugther seems overwhelmed caring for him.   July 11- BP and HR improved. RUQ USS shows possible acalchous cholecystitis. Recommendations Await bcx Continue vanco and levo pending culture results Add oral flagyl for anaerobic coverage Check HIDA Thank you very much for the consult. Will follow with you.  Gurfateh Mcclain P   07/14/2016, 2:40 PM

## 2016-07-14 NOTE — Consult Note (Addendum)
SURGICAL CONSULTATION NOTE (initial) - cpt: [id number]  HISTORY OF PRESENT ILLNESS (HPI):  61 y.o. male presented to [facility] ED with fatigue and weakness and was found to be hypotensive with leukocytosis. Patient denies he appreciated any abdominal pain until ED physician palpated patient's RUQ. Subsequent RUQ abdominal ultrasound was then performed. Patient continues to report RUQ abdominal pain only during deep palpation, and patient has otherwise continued tolerating a regular diet with +flatus and +BM's WNL without N/V, fever/chills, CP, or SOB.   Surgery is consulted by medical physician Dr. Bridgett Larsson in this context for evaluation and management of acalculous cholecystitis.  PAST MEDICAL HISTORY (PMH):  Past Medical History:  Diagnosis Date  . Autoimmune disease ([place])    skin problem  . Dysphagia   . FSGS (focal segmental glomerulosclerosis)   . Hyperlipidemia   . Muscle weakness (generalized)   . Renal disorder    end stage   . Spinal stenosis, cervical region      PAST SURGICAL HISTORY [facility]):  Past Surgical History:  Procedure Laterality Date  . SPINAL FUSION  2018     MEDICATIONS:  Prior to Admission medications   Medication Sig Start Date End Date Taking? Authorizing Provider  acetaminophen (TYLENOL) 325 MG tablet Take 975 mg by mouth every 8 (eight) hours.   Yes [provider]  atorvastatin (LIPITOR) 20 MG tablet Take 20 mg by mouth daily.   Yes [provider]  b complex-vitamin c-folic acid (NEPHRO-VITE) 0.8 MG TABS tablet Take 1 tablet by mouth at bedtime.   Yes [provider]  bisacodyl (DULCOLAX) 10 MG suppository Place 10 mg rectally daily as needed for moderate constipation.   Yes [provider]  calcium acetate (PHOSLO) 667 MG capsule Take 667 mg by mouth 3 (three) times daily with meals.   Yes [provider]  Carboxymethylcellul-Glycerin (REFRESH OPTIVE SENSITIVE OP) Place 1 drop into both eyes every 4 (four) hours.   Yes  [provider]  carvedilol (COREG) 25 MG tablet Take 25 mg by mouth 2 (two) times daily with a meal.   Yes [provider]  cetirizine (ZYRTEC) 10 MG tablet Take 10 mg by mouth daily.   Yes [provider]  cinacalcet (SENSIPAR) 90 MG tablet Take 90 mg by mouth daily.   Yes [provider]  cloNIDine (CATAPRES) 0.1 MG tablet Take 0.1 mg by mouth every 4 (four) hours. Take 1 tablet by mouth every 4 hours as needed for SBP over 180   Yes [provider]  gabapentin (NEURONTIN) 100 MG capsule Take 100 mg by mouth at bedtime.   Yes [provider]  hydrALAZINE (APRESOLINE) 10 MG tablet Take 10 mg by mouth 2 (two) times daily.   Yes [provider]  methocarbamol (ROBAXIN) 500 MG tablet Take 500 mg by mouth every 12 (twelve) hours.   Yes [provider]  naproxen (NAPROSYN) 500 MG tablet Take 500 mg by mouth 2 (two) times daily with a meal.   Yes [provider]  omeprazole (PRILOSEC) 20 MG capsule Take 20 mg by mouth 2 (two) times daily before a meal.   Yes [provider]  oxycodone (OXY-IR) 5 MG capsule Take 5-10 mg by mouth every 4 (four) hours as needed.   Yes [provider]  polyethylene glycol (MIRALAX / GLYCOLAX) packet Take 17 g by mouth daily.   Yes [provider]  Psyllium (KONSYL-D PO) Take 5 mLs by mouth 2 (two) times daily.   Yes  [provider]  senna-docusate (SENOKOT-S) 8.6-50 MG tablet Take 1 tablet by mouth 2 (two) times daily.   Yes [provider]  sertraline (ZOLOFT) 50 MG tablet Take 150 mg by mouth at bedtime.   Yes [provider]  simethicone (MYLICON) 80 MG chewable tablet Chew 80 mg by mouth 3 (three) times daily.   Yes [provider]  timolol (BETIMOL) 0.5 % ophthalmic solution Place 1 drop into both eyes daily.   Yes [provider]  traZODone (DESYREL) 100 MG tablet Take 100 mg by mouth at bedtime.   Yes [provider]     ALLERGIES:  Allergies  Allergen Reactions  . Albumin (Human) Hives  . Aloe   . Iodinated Diagnostic Agents   . Iron Dextran   . Metoprolol Tartrate   . Penicillins Swelling  . Quinine Sulfate [Quinine]   . Rabeprazole   . Sulfa Antibiotics Other (See Comments)  . Trimethoprim      SOCIAL HISTORY:  Social History   Social History  . Marital status: Married    Spouse name: N/A  . Number of children: N/A  . Years of education: N/A   Occupational History  . Not on file.   Social History Main Topics  . Smoking status: Never Smoker  . Smokeless tobacco: Never Used  . Alcohol use No  . Drug use: No  . Sexual activity: Not on file   Other Topics Concern  . Not on file   Social History Narrative  . No narrative on file    The patient currently resides (home / rehab facility / nursing home): Home after recently discharged from rehab following cervical spine surgery The patient normally is (ambulatory / bedbound): Ambulatory   FAMILY HISTORY:  Family History  Problem Relation Age of Onset  . Hypertension Mother     REVIEW OF SYSTEMS:  Constitutional: denies weight loss, fever, chills, or sweats  Eyes: denies any other vision changes, history of eye injury  ENT: denies sore throat, hearing problems  Respiratory: denies shortness of breath, wheezing  Cardiovascular: denies chest pain, palpitations  Gastrointestinal: abdominal pain, N/V, and bowel function as per HPI Genitourinary: denies burning with urination or urinary frequency Musculoskeletal: denies any other joint pains or cramps  Skin: denies any other rashes or skin discolorations  Neurological: denies any other headache, dizziness, weakness  Psychiatric: denies any other depression, anxiety   All other review of systems were negative   VITAL SIGNS:  Temp:  [97.7 F (36.5 C)-97.8 F (36.6 C)] 97.8 F (36.6 C) (07/10 1815) Pulse Rate:  [64-79] 70 (07/10 1815) Resp:  [10-19] 13  (07/10 1815) BP: (89-164)/(57-97) 164/95 (07/10 1815) SpO2:  [93 %-99 %] 96 % (07/10 1815) Weight:  [170 lb 13.7 oz (77.5 kg)-171 lb 15.3 oz (78 kg)] 171 lb 15.3 oz (78 kg) (07/10 1815)     Height: 5\' 10"  (177.8 cm) Weight: 171 lb 15.3 oz (78 kg) BMI (Calculated): 24.2   INTAKE/OUTPUT:  This shift: No intake/output data recorded.  Last 2 shifts: @IOLAST2SHIFTS @   PHYSICAL EXAM:  Constitutional:  -- Normal body habitus  -- Awake, alert, and oriented x3  Eyes:  -- Pupils equally round and reactive to light  -- No scleral icterus  Ear, nose, and throat:  -- No jugular venous distension  Pulmonary:  -- No crackles  -- Equal breath sounds bilaterally -- Breathing non-labored at rest Cardiovascular:  -- S1, S2 present  -- No pericardial rubs Gastrointestinal:  --  Abdomen soft and nondistended with mild-/moderate- RUQ abdominal tenderness to deep palpation, no guarding/rebound  -- No abdominal masses appreciated, pulsatile or otherwise  Musculoskeletal and Integumentary:  -- Wounds or skin discoloration: None appreciated -- Extremities: B/L UE and LE FROM, hands and feet warm, no edema  Neurologic:  -- Motor function: intact and symmetric -- Sensation: intact and symmetric  Labs:  CBC Latest Ref Rng & Units 07/14/2016 07/13/2016 07/12/2016  WBC 3.8 - 10.6 K/uL 15.8(H) 18.6(H) 23.4(H)  Hemoglobin 13.0 - 18.0 g/dL 11.1(L) 10.0(L) 10.4(L)  Hematocrit 40.0 - 52.0 % 34.0(L) 30.4(L) 31.3(L)  Platelets 150 - 440 K/uL 393 317 344   CMP Latest Ref Rng & Units 07/13/2016 07/12/2016 06/16/2013  Glucose 65 - 99 mg/dL 102(H) 123(H) 77  BUN 6 - 20 mg/dL 61(H) 54(H) 38(H)  Creatinine 0.61 - 1.24 mg/dL 7.57(H) 7.16(H) 8.82(H)  Sodium 135 - 145 mmol/L 137 136 141  Potassium 3.5 - 5.1 mmol/L 4.3 3.8 4.4  Chloride 101 - 111 mmol/L 99(L) 96(L) 103  CO2 22 - 32 mmol/L 28 27 31   Calcium 8.9 - 10.3 mg/dL 9.6 9.5 8.9  Total Protein 6.5 - 8.1 g/dL - 7.6 7.9  Total Bilirubin 0.3 - 1.2 mg/dL - 1.9(H) 0.4   Alkaline Phos 38 - 126 U/L - 205(H) 62  AST 15 - 41 U/L - 75(H) 25  ALT 17 - 63 U/L - 47 19    Imaging studies:  Limited RUQ Abdominal Ultrasound (07/14/2016) Gallbladder wall thickening and pericholecystic fluid is noted, although no cholelithiasis is noted. Sludge is noted within gallbladder lumen. This may represent acalculous cholecystitis, and HIDA scan is recommended for further evaluation.  Ill-defined heterogeneous abnormality seen adjacent to pancreatic body. CT scan is recommended for further evaluation.  Assessment/Plan: (ICD-10's: K81.9) 61 y.o. 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   - clear liquids diet  - check HIDA scan  - if cholecystectomy, likely open (unlikely laparoscopic due to prior open abdominal surgeries)  - medical management of comorbidities per primary medical team  - DVT prophylaxis  All of the above findings and recommendations were discussed with the patient and his family, and all of patient's and his family's questions were answered to their expressed satisfaction.  Thank you for the opportunity to participate in this patient's care.   -- Marilynne Drivers Rosana Hoes, MD, [place]: [facility] - Partnering for exceptional care. Office: [phone]

## 2016-07-14 NOTE — Care Management (Signed)
Received call from [place] at the [facility] late yesterday. She has faxed transfer paper work. Will have MD completed and fax back if he thinks patient is stable for discharge to the [place].

## 2016-07-14 NOTE — Care Management (Signed)
Met with patient and his daughter at bedside. Patient now refusing transfer. Faxed refusal form and H & P to [place] at the [facility].

## 2016-07-14 NOTE — Consult Note (Signed)
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 More than 20 years.   Spinal stenosis and cervical area and had undergone decompression surgery and cervical fusion last month. Autoimmune blistering and scabbed lesions are present to arms, hands and legs. Medical adhesive seems to make this worse.  Full thickness pressure injury, resolving to left elbow.  Wife states patient is resistant to offloading and turning, due to pain and this developed in rehab.  Wound type: Inflammatory Pressure Injury POA: Yes Measurement: Left elbow- 4 cm x 4 cm x 0.1 cm healing full thickness pressure injury. Dialysis shunt in left forearm is large and protruding and creates a challenge with bandaging.  Scattered peeling lesions with scabbing present to arms, legs and hands/fingers.   Wound SFS:ELTRVUY Drainage (amount, consistency, odor) lesions bleed initially and then scab over.  Periwound: intact Dressing procedure/placement/frequency:Cleanse wound to left elbow with soap and water.  Apply silicone border foam.  Change every other day.   Silicone border foam to any wounds that are draining.  Petrolatum ointment to scabbed lesions daily.  Woc team will follow.  Patient is going downstairs for testing. Will meet wife at 16 AM tomorrow and demonstrate wound care per her request.  Domenic Moras RN BSN [facility] Pager [phone]

## 2016-07-14 NOTE — Progress Notes (Signed)
[facility], [place] 07/14/16  Subjective:   Patient is known to our practice from outpatient dialysis.  He presents for symptomatic low blood pressure.  He is currently a resident at [facility].  Upon presentation he reported fatigue and generalized weakness.  No shortness of breath.  Patient tolerated dialysis well yesterday His right upper quadrant ultrasound shows acalculus cholecystitis He scheduled for HIDA scan later today He is back on clears.  No nausea or vomiting reported.  Objective:  Vital signs in last 24 hours:  Temp:  [97.8 F (36.6 C)] 97.8 F (36.6 C) (07/10 1815) Pulse Rate:  [64-70] 70 (07/10 1815) Resp:  [10-13] 13 (07/10 1815) BP: (96-164)/(72-97) 152/90 (07/11 1145) SpO2:  [93 %-98 %] 96 % (07/10 1815) Weight:  [78 kg (171 lb 15.3 oz)] 78 kg (171 lb 15.3 oz) (07/10 1815)  Weight change: 1.296 kg (2 lb 13.7 oz) Filed Weights   07/12/16 1736 07/13/16 1400 07/13/16 1815  Weight: 76.2 kg (168 lb) 77.5 kg (170 lb 13.7 oz) 78 kg (171 lb 15.3 oz)    Intake/Output:    Intake/Output Summary (Last 24 hours) at 07/14/16 1450 Last data filed at 07/14/16 1300  Gross per 24 hour  Intake              900 ml  Output             -500 ml  Net             1400 ml     Physical Exam: General: No acute distress, sitting up in the chair  HEENT Anicteric, moist oral mucous membranes  Neck supple  Pulm/lungs Normal effort, clear to auscultation  CVS/Heart No rub or gallop  Abdomen:  Soft, slight right upper quadrant tenderness  Extremities: No edema  Neurologic: Alert, oriented     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       Basic Metabolic Panel:   Recent Labs Lab 07/12/16 1740 07/13/16 0400 07/14/16 1018  NA 136 137 138  K 3.8 4.3 4.3  CL 96* 99* 97*  CO2 27 28 29   GLUCOSE 123* 102* 80  BUN 54* 61* 32*  CREATININE 7.16* 7.57* 5.34*  CALCIUM 9.5 9.6 10.5*     CBC:  Recent Labs Lab  07/12/16 1740 07/13/16 0400 07/14/16 1018  WBC 23.4* 18.6* 15.8*  NEUTROABS 18.4*  --   --   HGB 10.4* 10.0* 11.1*  HCT 31.3* 30.4* 34.0*  MCV 103.6* 104.9* 102.3*  PLT 344 317 393     No results found for: HEPBSAG, HEPBSAB, HEPBIGM    Microbiology:  Recent Results (from the past 240 hour(s))  Blood Culture (routine x 2)     Status: None (Preliminary result)   Collection Time: 07/12/16  6:29 PM  Result Value Ref Range Status   Specimen Description BLOOD BLOOD RIGHT FOREARM  Final   Special Requests   Final    BOTTLES DRAWN AEROBIC AND ANAEROBIC Blood Culture adequate volume   Culture NO GROWTH 2 DAYS  Final   Report Status PENDING  Incomplete  Blood Culture (routine x 2)     Status: None (Preliminary result)   Collection Time: 07/12/16  6:29 PM  Result Value Ref Range Status   Specimen Description BLOOD RIGHT ANTECUBITAL  Final   Special Requests   Final    BOTTLES DRAWN AEROBIC AND ANAEROBIC Blood Culture adequate volume   Culture NO GROWTH 2 DAYS  Final  Report Status PENDING  Incomplete  MRSA PCR Screening     Status: None   Collection Time: 07/13/16  3:32 AM  Result Value Ref Range Status   MRSA by PCR NEGATIVE NEGATIVE Final    Comment:        The GeneXpert MRSA Assay (FDA approved for NASAL specimens only), is one component of a comprehensive MRSA colonization surveillance program. It is not intended to diagnose MRSA infection nor to guide or monitor treatment for MRSA infections.     Coagulation Studies: No results for input(s): LABPROT, INR in the last 72 hours.  Urinalysis: No results for input(s): COLORURINE, LABSPEC, PHURINE, GLUCOSEU, HGBUR, BILIRUBINUR, KETONESUR, PROTEINUR, UROBILINOGEN, NITRITE, LEUKOCYTESUR in the last 72 hours.  Invalid input(s): APPERANCEUR    Imaging: Dg Chest 2 View  Result Date: 07/12/2016 CLINICAL DATA:  Generalized weakness, fatigue, and occasional cough EXAM: CHEST  2 VIEW COMPARISON:  05/04/2012 FINDINGS: Normal  heart size, mediastinal contours, and pulmonary vascularity. Atherosclerotic calcification aorta. Increased bronchitic changes since prior study. Increased interstitial markings since previous exam, though slightly accentuated previously as well, question mild chronic interstitial lung disease with progression. No segmental consolidation, pleural effusion or pneumothorax. Interval cervical spine fusion. Perigastric surgical clips. Slightly sclerotic appearance of the spine is unchanged could be related to patient's history of end-stage renal disease per EMR. IMPRESSION: Increased bronchitic interstitial changes since previous exam question increased bronchitis versus progression of chronic interstitial lung disease. Aortic Atherosclerosis (ICD10-I70.0). Electronically Signed   By: Lavonia Dana M.D.   On: 07/12/2016 18:28   US Venous Img Upper Uni Left  Result Date: 07/12/2016 CLINICAL DATA:  Left upper extremity swelling. Patient with history of left upper extremity fistula for dialysis for decades. Swelling for 1 week. EXAM: LEFT UPPER EXTREMITY VENOUS DOPPLER ULTRASOUND TECHNIQUE: Gray-scale sonography with graded compression, as well as color Doppler and duplex ultrasound were performed to evaluate the upper extremity deep venous system from the level of the subclavian vein and including the jugular, axillary, basilic, radial, ulnar and upper cephalic vein. Spectral Doppler was utilized to evaluate flow at rest and with distal augmentation maneuvers. COMPARISON:  None. FINDINGS: Contralateral Subclavian Vein: Respiratory phasicity is normal and symmetric with the symptomatic side. No evidence of thrombus. Normal compressibility. Internal Jugular Vein: No evidence of thrombus. Normal compressibility, respiratory phasicity and response to augmentation. Subclavian Vein: No evidence of thrombus. Normal compressibility, respiratory phasicity and response to augmentation. Axillary Vein: No evidence of thrombus.  Normal compressibility, respiratory phasicity and response to augmentation. Cephalic Vein: There is eccentric nonocclusive thrombus/ venous wall thickening in the cephalic vein. Basilic Vein: No evidence of thrombus. Normal compressibility, respiratory phasicity and response to augmentation. Brachial Veins: No evidence of thrombus. Normal compressibility, respiratory phasicity and response to augmentation. Radial Veins: No evidence of thrombus. Normal compressibility, respiratory phasicity and response to augmentation. Ulnar Veins: No evidence of thrombus. Normal compressibility, respiratory phasicity and response to augmentation. Other Findings: Limited interrogation at the patient's dialysis fistula demonstrated patent flow in the upper arm anastomosis. There is an avascular fluid collection labeled left shoulder at that measures 4.6 x 1.5 x 4.6 cm. IMPRESSION: 1. No evidence of DVT within the left upper extremity. 2. Chronic appearing nonocclusive thrombus within the cephalic vein. 3. Left upper extremity dialysis fistula which is grossly patent, detailed interrogation not performed. 4. Fluid collection about the left shoulder measuring 4.6 cm, may be liquefying hematoma. No surrounding hyperemia or internal complexity to suggest superimposed infection. Electronically Signed   By: Threasa Beards  Ehinger M.D.   On: 07/12/2016 20:00   US Abdomen Limited Ruq  Result Date: 07/14/2016 CLINICAL DATA:  Elevated bilirubin level. EXAM: ULTRASOUND ABDOMEN LIMITED RIGHT UPPER QUADRANT COMPARISON:  Ultrasound of July 04, 2006. FINDINGS: Gallbladder: No gallstones are noted. Mild amount of pericholecystic fluid is noted. Gallbladder wall thickening is noted measured at 7 mm. Sludge is noted within gallbladder lumen. No sonographic Murphy's sign is noted. Common bile duct: Diameter: 2.7 mm which is within normal limits. Liver: No focal lesion identified. Within normal limits in parenchymal echogenicity. Heterogeneous abnormality  seen adjacent to the pancreas. IMPRESSION: Gallbladder wall thickening and pericholecystic fluid is noted, although no cholelithiasis is noted. Sludge is noted within gallbladder lumen. This may represent acalculous cholecystitis, and HIDA scan is recommended for further evaluation. Ill-defined heterogeneous abnormality seen adjacent to pancreatic body. CT scan is recommended for further evaluation. Electronically Signed   By: Marijo Conception, M.D.   On: 07/14/2016 09:30     Medications:   . levofloxacin (LEVAQUIN) IV    . vancomycin Stopped (07/13/16 2046)   . acetaminophen  975 mg Oral Q8H  . atorvastatin  20 mg Oral Daily  . calcium acetate  667 mg Oral TID WC  . carvedilol  3.125 mg Oral BID WC  . cinacalcet  90 mg Oral Daily  . cloNIDine  0.1 mg Oral TID  . gabapentin  100 mg Oral QHS  . heparin  5,000 Units Subcutaneous Q8H  . loratadine  10 mg Oral Daily  . methocarbamol  500 mg Oral Q12H  . multivitamin  1 tablet Oral QHS  . naproxen  500 mg Oral BID WC  . pantoprazole  40 mg Oral Daily  . senna-docusate  1 tablet Oral BID  . sertraline  150 mg Oral QHS  . simethicone  80 mg Oral TID  . timolol  1 drop Both Eyes Daily  . traZODone  100 mg Oral QHS  . white petrolatum   Topical Daily   bisacodyl, docusate sodium, oxyCODONE  Assessment/ Plan:  61 y.o. caucasian male with ESRD, dialysis for 28 years, during the patient, recent cervical decompression, autoimmune skin disease presents for hypotension  [facility], T[facility], [facility]  1.  ESRD Routine hemodialysis to be scheduled for tomorrow  2.  Hypotension - blood pressure has improved  3. Secondary hyperparathyroidism continue home dose of calcium acetate been able to eat proper meals  4. Anemia of CKD - EPO with HD  5. Acalculus cholecystitis HIDA scan scheduled for later today     LOS: 2 [facility] 7/11/20182:50 PM  [place], [place]

## 2016-07-14 NOTE — Progress Notes (Signed)
Chaplain received a consult to provide emotion support for pt's daughter and for prayer for pf facing major health changes and has wife suffering from [place]. [place] met pt, but daughter was out of the Rm taking care of another family member who is in ICU. Pt talked about his health struggles, his wife, and the family member in ICU. Pt was hopeful of getting better and asked for prayers. CH offered prayers for pt and will have a follow up with pt to talk to daughter tomorrow.   07/14/16 1600  Clinical Encounter Type  Visited With Patient  Visit Type Critical Care  Referral From Nurse  Consult/Referral To Chaplain  Spiritual Encounters  Spiritual Needs Prayer;Emotional

## 2016-07-14 NOTE — Progress Notes (Signed)
   07/14/16 1500  Clinical Encounter Type  Visited With Patient;Patient not available  Visit Type Initial;Critical Care  Referral From Nurse;Physician  Consult/Referral To Chaplain   CH responded to consult for prayer and major life transition. Family not immediately available. Chaplain will follow-up on consult request at a later time.

## 2016-07-14 NOTE — Progress Notes (Signed)
[facility] [place] NAME: William Carpenter    MR#:  [id number]  DATE OF BIRTH:  [birth year]  SUBJECTIVE:  CHIEF COMPLAINT:   Chief Complaint  Patient presents with  . Fatigue  . Weakness   Weakness. No complains. REVIEW OF SYSTEMS:  Review of Systems  Constitutional: Positive for malaise/fatigue. Negative for chills and fever.  HENT: Negative for sore throat.   Eyes: Negative for blurred vision and double vision.  Respiratory: Negative for cough, shortness of breath and stridor.   Cardiovascular: Negative for chest pain and leg swelling.  Gastrointestinal: Negative for abdominal pain, blood in stool, diarrhea, melena, nausea and vomiting.  Genitourinary: Negative for dysuria and hematuria.  Musculoskeletal: Negative for back pain.  Skin: Negative for itching and rash.  Neurological: Positive for weakness. Negative for dizziness, focal weakness, loss of consciousness and headaches.  Psychiatric/Behavioral: Negative for depression. The patient is not nervous/anxious.     DRUG ALLERGIES:   Allergies  Allergen Reactions  . Albumin (Human) Hives  . Aloe   . Iodinated Diagnostic Agents   . Iron Dextran   . Metoprolol Tartrate   . Penicillins Swelling  . Quinine Sulfate [Quinine]   . Rabeprazole   . Sulfa Antibiotics Other (See Comments)  . Trimethoprim    VITALS:  Blood pressure (!) 144/94, pulse 82, temperature 98.5 F (36.9 C), temperature source Oral, resp. rate 17, height 5\' 10"  (1.778 m), weight 78 kg (171 lb 15.3 oz), SpO2 99 %. PHYSICAL EXAMINATION:  Physical Exam  Constitutional: He is oriented to person, place, and time and well-developed, well-nourished, and in no distress.  HENT:  Head: Normocephalic.  Eyes: Conjunctivae and EOM are normal. No scleral icterus.  Neck: Normal range of motion. Neck supple. No JVD present. No tracheal deviation present.  Cardiovascular: Normal rate and normal heart sounds.  Exam  reveals no gallop.   No murmur heard. Pulmonary/Chest: Effort normal and breath sounds normal. No respiratory distress. He has no wheezes. He has no rales.  Abdominal: Soft. Bowel sounds are normal. He exhibits no distension. There is no tenderness.  Musculoskeletal: Normal range of motion. He exhibits no edema or tenderness.  Neurological: He is alert and oriented to person, place, and time. No cranial nerve deficit.  Skin: No rash noted. No erythema.  Psychiatric: Affect normal.   LABORATORY PANEL:  Male CBC  Recent Labs Lab 07/14/16 1018  WBC 15.8*  HGB 11.1*  HCT 34.0*  PLT 393   ------------------------------------------------------------------------------------------------------------------ Chemistries   Recent Labs Lab 07/14/16 1018  NA 138  K 4.3  CL 97*  CO2 29  GLUCOSE 80  BUN 32*  CREATININE 5.34*  CALCIUM 10.5*  AST 30  ALT 36  ALKPHOS 269*  BILITOT 1.7*   RADIOLOGY:  US Abdomen Limited Ruq  Result Date: 07/14/2016 CLINICAL DATA:  Elevated bilirubin level. EXAM: ULTRASOUND ABDOMEN LIMITED RIGHT UPPER QUADRANT COMPARISON:  Ultrasound of July 04, 2006. FINDINGS: Gallbladder: No gallstones are noted. Mild amount of pericholecystic fluid is noted. Gallbladder wall thickening is noted measured at 7 mm. Sludge is noted within gallbladder lumen. No sonographic Murphy's sign is noted. Common bile duct: Diameter: 2.7 mm which is within normal limits. Liver: No focal lesion identified. Within normal limits in parenchymal echogenicity. Heterogeneous abnormality seen adjacent to the pancreas. IMPRESSION: Gallbladder wall thickening and pericholecystic fluid is noted, although no cholelithiasis is noted. Sludge is noted within gallbladder lumen. This may represent acalculous cholecystitis, and HIDA scan is  recommended for further evaluation. Ill-defined heterogeneous abnormality seen adjacent to pancreatic body. CT scan is recommended for further evaluation. Electronically  Signed   By: Marijo Conception, M.D.   On: 07/14/2016 09:30   ASSESSMENT AND PLAN:   * Leukocytosis- acalculous cholecystitis  on broad-spectrum antibiotic, blood cultures are sent. Patient cannot give urine sample as he does not produce urine. Chest x-ray is negative. F/u  CBC, blood culture, Infectious disease and Rheumatology consult. Appreciated surgery consult.  HIDA scan.  * ESRD on HD HD per Dr. Candiss Norse.  * Hypotension   Hold HTN  meds, monitor.  * Autoimmune skin disorder   We will call hematology consult as this may be all his reaction unknown infection.  * Elevated bilirubin  right upper quadrant ultrasound study shows acalculous cholecystitis, surgery consult.  All the records are reviewed and case discussed with Care Management/Social Worker. Management plans discussed with the patient, family and they are in agreement.  CODE STATUS: Full Code  TOTAL TIME TAKING CARE OF THIS PATIENT: 33 minutes.   More than 50% of the time was spent in counseling/coordination of care: YES  POSSIBLE D/C IN 2 DAYS, DEPENDING ON CLINICAL CONDITION.   Vaughan Basta M.D on 07/14/2016 at 8:32 PM  Between 7am to 6pm - Pager - [phone]  After 6pm go to [email or web address] - Proofreader  [facility]  Office  [phone]  CC: Primary care physician; Raelyn Mora, MD  Note: This dictation was prepared with Dragon dictation along with smaller phrase technology. Any transcriptional errors that result from this process are unintentional.

## 2016-07-15 ENCOUNTER — Inpatient Hospital Stay: Payer: Medicare Other

## 2016-07-15 ENCOUNTER — Encounter: Payer: Self-pay | Admitting: Radiology

## 2016-07-15 LAB — COMPREHENSIVE METABOLIC PANEL
ALBUMIN: 2.3 g/dL — AB (ref 3.5–5.0)
ALT: 32 U/L (ref 17–63)
ANION GAP: 11 (ref 5–15)
AST: 29 U/L (ref 15–41)
Alkaline Phosphatase: 326 U/L — ABNORMAL HIGH (ref 38–126)
BUN: 40 mg/dL — ABNORMAL HIGH (ref 6–20)
CHLORIDE: 98 mmol/L — AB (ref 101–111)
CO2: 27 mmol/L (ref 22–32)
Calcium: 10 mg/dL (ref 8.9–10.3)
Creatinine, Ser: 6.14 mg/dL — ABNORMAL HIGH (ref 0.61–1.24)
GFR calc non Af Amer: 9 mL/min — ABNORMAL LOW (ref >60)
GFR, EST AFRICAN AMERICAN: 10 mL/min — AB (ref >60)
GLUCOSE: 81 mg/dL (ref 65–99)
Potassium: 4.3 mmol/L (ref 3.5–5.1)
SODIUM: 136 mmol/L (ref 135–145)
Total Bilirubin: 1.9 mg/dL — ABNORMAL HIGH (ref 0.3–1.2)
Total Protein: 7.5 g/dL (ref 6.5–8.1)

## 2016-07-15 LAB — LIPASE, BLOOD: Lipase: 40 U/L (ref 11–51)

## 2016-07-15 MED ORDER — TECHNETIUM TC 99M MEBROFENIN IV KIT
5.0940 | PACK | Freq: Once | INTRAVENOUS | Status: AC | PRN
  Administered 2016-07-15: 5.094 via INTRAVENOUS

## 2016-07-15 NOTE — Progress Notes (Signed)
Pt placed on [facility] CPAP C-5. CPAP plugged into red outlet. Pt tolerating well. 

## 2016-07-15 NOTE — Progress Notes (Signed)
[facility] [place], [place] 07/15/16  Subjective:    His right upper quadrant ultrasound shows acalculus cholecystitis; underwent HIDA scan last evening.   states he is hungry today Awaiting dialysis  Objective:  Vital signs in last 24 hours:  Temp:  [97.7 F (36.5 C)-98.5 F (36.9 C)] 97.7 F (36.5 C) (07/12 0607) Pulse Rate:  [70-87] 87 (07/11 2130) Resp:  [17-18] 18 (07/11 2130) BP: (128-145)/(84-94) 128/86 (07/11 2130) SpO2:  [98 %-99 %] 98 % (07/11 2130)  Weight change:  Filed Weights   07/12/16 1736 07/13/16 1400 07/13/16 1815  Weight: 76.2 kg (168 lb) 77.5 kg (170 lb 13.7 oz) 78 kg (171 lb 15.3 oz)    Intake/Output:    Intake/Output Summary (Last 24 hours) at 07/15/16 1224 Last data filed at 07/15/16 9528  Gross per 24 hour  Intake             1350 ml  Output                0 ml  Net             1350 ml     Physical Exam: General: No acute distress,   HEENT Anicteric, moist oral mucous membranes  Neck supple  Pulm/lungs Normal effort, clear to auscultation  CVS/Heart No rub or gallop  Abdomen:  Soft, slight right upper quadrant tenderness  Extremities: No edema  Neurologic: Alert, oriented     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       Basic Metabolic Panel:   Recent Labs Lab 07/12/16 1740 07/13/16 0400 07/14/16 1018 07/15/16 0340  NA 136 137 138 136  K 3.8 4.3 4.3 4.3  CL 96* 99* 97* 98*  CO2 27 28 29 27   GLUCOSE 123* 102* 80 81  BUN 54* 61* 32* 40*  CREATININE 7.16* 7.57* 5.34* 6.14*  CALCIUM 9.5 9.6 10.5* 10.0     CBC:  Recent Labs Lab 07/12/16 1740 07/13/16 0400 07/14/16 1018  WBC 23.4* 18.6* 15.8*  NEUTROABS 18.4*  --   --   HGB 10.4* 10.0* 11.1*  HCT 31.3* 30.4* 34.0*  MCV 103.6* 104.9* 102.3*  PLT 344 317 393     No results found for: HEPBSAG, HEPBSAB, HEPBIGM    Microbiology:  Recent Results (from the past 240 hour(s))  Blood Culture (routine x 2)     Status: None  (Preliminary result)   Collection Time: 07/12/16  6:29 PM  Result Value Ref Range Status   Specimen Description BLOOD BLOOD RIGHT FOREARM  Final   Special Requests   Final    BOTTLES DRAWN AEROBIC AND ANAEROBIC Blood Culture adequate volume   Culture NO GROWTH 3 DAYS  Final   Report Status PENDING  Incomplete  Blood Culture (routine x 2)     Status: None (Preliminary result)   Collection Time: 07/12/16  6:29 PM  Result Value Ref Range Status   Specimen Description BLOOD RIGHT ANTECUBITAL  Final   Special Requests   Final    BOTTLES DRAWN AEROBIC AND ANAEROBIC Blood Culture adequate volume   Culture NO GROWTH 3 DAYS  Final   Report Status PENDING  Incomplete  MRSA PCR Screening     Status: None   Collection Time: 07/13/16  3:32 AM  Result Value Ref Range Status   MRSA by PCR NEGATIVE NEGATIVE Final    Comment:        The GeneXpert MRSA Assay (FDA approved for NASAL specimens only), is one  component of a comprehensive MRSA colonization surveillance program. It is not intended to diagnose MRSA infection nor to guide or monitor treatment for MRSA infections.     Coagulation Studies: No results for input(s): LABPROT, INR in the last 72 hours.  Urinalysis: No results for input(s): COLORURINE, LABSPEC, PHURINE, GLUCOSEU, HGBUR, BILIRUBINUR, KETONESUR, PROTEINUR, UROBILINOGEN, NITRITE, LEUKOCYTESUR in the last 72 hours.  Invalid input(s): APPERANCEUR    Imaging: Nm Hepato W/eject Fract  Result Date: 07/15/2016 CLINICAL DATA:  Abdominal pain for 2-3 weeks, question acute acalculous cholecystitis EXAM: NUCLEAR MEDICINE HEPATOBILIARY IMAGING WITH GALLBLADDER EF TECHNIQUE: Sequential images of the abdomen were obtained out to 60 minutes following intravenous administration of radiopharmaceutical. After oral ingestion of Ensure, gallbladder ejection fraction was determined. At 60 min, normal ejection fraction is greater than 33%. RADIOPHARMACEUTICALS:  5.094 mCi Tc-67m  Choletec IV  COMPARISON:  None; correlation ultrasound abdomen 07/14/2016 FINDINGS: Normal tracer extraction from bloodstream indicating normal hepatocellular function. Normal excretion of tracer into biliary tree. Gallbladder visualized at 13 min. Small bowel not visualized until following fatty meal stimulation No hepatic retention of tracer. Subjectively decreased emptying of tracer from gallbladder following fatty meal stimulation. Calculated gallbladder ejection fraction is 21%, abnormally low. Patient reported no symptoms following Ensure ingestion. Normal gallbladder ejection fraction following Ensure ingestion is greater than 33% at 1 hour. IMPRESSION: Patent biliary tree. Abnormal gallbladder response to fatty meal stimulation with a decreased gallbladder ejection fraction of 21%. Electronically Signed   By: Lavonia Dana M.D.   On: 07/15/2016 11:26   US Abdomen Limited Ruq  Result Date: 07/14/2016 CLINICAL DATA:  Elevated bilirubin level. EXAM: ULTRASOUND ABDOMEN LIMITED RIGHT UPPER QUADRANT COMPARISON:  Ultrasound of July 04, 2006. FINDINGS: Gallbladder: No gallstones are noted. Mild amount of pericholecystic fluid is noted. Gallbladder wall thickening is noted measured at 7 mm. Sludge is noted within gallbladder lumen. No sonographic Murphy's sign is noted. Common bile duct: Diameter: 2.7 mm which is within normal limits. Liver: No focal lesion identified. Within normal limits in parenchymal echogenicity. Heterogeneous abnormality seen adjacent to the pancreas. IMPRESSION: Gallbladder wall thickening and pericholecystic fluid is noted, although no cholelithiasis is noted. Sludge is noted within gallbladder lumen. This may represent acalculous cholecystitis, and HIDA scan is recommended for further evaluation. Ill-defined heterogeneous abnormality seen adjacent to pancreatic body. CT scan is recommended for further evaluation. Electronically Signed   By: Marijo Conception, M.D.   On: 07/14/2016 09:30      Medications:   . levofloxacin (LEVAQUIN) IV Stopped (07/14/16 1807)  . vancomycin Stopped (07/13/16 2046)   . acetaminophen  975 mg Oral Q8H  . atorvastatin  20 mg Oral Daily  . calcium acetate  667 mg Oral TID WC  . carvedilol  3.125 mg Oral BID WC  . cinacalcet  90 mg Oral Daily  . cloNIDine  0.1 mg Oral TID  . gabapentin  100 mg Oral QHS  . heparin  5,000 Units Subcutaneous Q8H  . loratadine  10 mg Oral Daily  . methocarbamol  500 mg Oral Q12H  . metroNIDAZOLE  500 mg Oral Q8H  . multivitamin  1 tablet Oral QHS  . naproxen  500 mg Oral BID WC  . pantoprazole  40 mg Oral Daily  . senna-docusate  1 tablet Oral BID  . sertraline  150 mg Oral QHS  . simethicone  80 mg Oral TID  . timolol  1 drop Both Eyes Daily  . traZODone  100 mg Oral QHS  . white  petrolatum   Topical Daily   bisacodyl, docusate sodium, oxyCODONE  Assessment/ Plan:  61 y.o. caucasian male with ESRD, dialysis for 28 years, during the patient, recent cervical decompression, autoimmune skin disease presents for hypotension  [facility]  1.  ESRD Routine hemodialysis today afternoon  2.  Hypotension - blood pressure has improved  3. Secondary hyperparathyroidism continue home dose of calcium acetate when able to eat proper meals  4. Anemia of CKD - EPO with HD  5. Acalculus cholecystitis Plans as per surgery and IM team     LOS: 3 [facility] 7/12/201812:24 PM  [facility] [place], [place]

## 2016-07-15 NOTE — Progress Notes (Signed)
[place] INFECTIOUS DISEASE PROGRESS NOTE Date of Admission:  07/12/2016     ID: William Carpenter is a 61 y.o. male with leukocytosis Principal Problem:   Leukocytosis Active Problems:   Hypotension  Subjective: No fevers, less abd pain.   ROS  Eleven systems are reviewed and negative except per hpi  Medications:  Antibiotics Given (last 72 hours)    Date/Time Action Medication Dose Rate   07/12/16 1943 New Bag/Given   levofloxacin (LEVAQUIN) IVPB 750 mg 750 mg 100 mL/hr   07/12/16 1949 New Bag/Given   aztreonam (AZACTAM) 2 g in dextrose 5 % 50 mL IVPB 2 g 100 mL/hr   07/12/16 2019 New Bag/Given   vancomycin (VANCOCIN) 1,500 mg in sodium chloride 0.9 % 500 mL IVPB 1,500 mg 250 mL/hr   07/13/16 1000 New Bag/Given   aztreonam (AZACTAM) 500 mg in dextrose 5 % 50 mL IVPB 500 mg 100 mL/hr   07/13/16 1800 New Bag/Given   vancomycin (VANCOCIN) IVPB 750 mg/150 ml premix 750 mg 150 mL/hr   07/14/16 1530 Given   metroNIDAZOLE (FLAGYL) tablet 500 mg 500 mg    07/14/16 1707 New Bag/Given   levofloxacin (LEVAQUIN) IVPB 500 mg 500 mg 100 mL/hr   07/14/16 2122 Given   metroNIDAZOLE (FLAGYL) tablet 500 mg 500 mg      . acetaminophen  975 mg Oral Q8H  . atorvastatin  20 mg Oral Daily  . calcium acetate  667 mg Oral TID WC  . carvedilol  3.125 mg Oral BID WC  . cinacalcet  90 mg Oral Daily  . cloNIDine  0.1 mg Oral TID  . gabapentin  100 mg Oral QHS  . heparin  5,000 Units Subcutaneous Q8H  . loratadine  10 mg Oral Daily  . methocarbamol  500 mg Oral Q12H  . metroNIDAZOLE  500 mg Oral Q8H  . multivitamin  1 tablet Oral QHS  . naproxen  500 mg Oral BID WC  . pantoprazole  40 mg Oral Daily  . senna-docusate  1 tablet Oral BID  . sertraline  150 mg Oral QHS  . simethicone  80 mg Oral TID  . timolol  1 drop Both Eyes Daily  . traZODone  100 mg Oral QHS  . white petrolatum   Topical Daily    Objective: Vital signs in last 24 hours: Temp:  [97.7 F (36.5 C)-98.5 F (36.9  C)] 97.7 F (36.5 C) (07/12 0607) Pulse Rate:  [70-87] 87 (07/11 2130) Resp:  [17-18] 18 (07/11 2130) BP: (128-145)/(84-94) 128/86 (07/11 2130) SpO2:  [98 %-99 %] 98 % (07/11 2130) Constitutional: He is oriented to person, place, and time. He appears well-developed and well-nourished. No distress.  HENT:  Mouth/Throat: Oropharynx is clear and moist. No oropharyngeal exudate.  Cardiovascular: Normal rate, regular rhythm and normal heart sounds.  HD  access RUE wnl - Pulmonary/Chest: Effort normal and breath sounds normal. No respiratory distress. He has no wheezes.  Abdominal: Soft. Bowel sounds are normal. He exhibits no distension. There is midl tenderness.  Lymphadenopathy: He has no cervical adenopathy.  Neurological: He is awake but slowed mentation .  Skin: a few scabs on arms and leg. Ant neck incision site wnl  Psychiatric: He has a normal mood and affect. His behavior is normal.   Lab Results  Recent Labs  07/13/16 0400 07/14/16 1018 07/15/16 0340  WBC 18.6* 15.8*  --   HGB 10.0* 11.1*  --   HCT 30.4* 34.0*  --   NA  137 138 136  K 4.3 4.3 4.3  CL 99* 97* 98*  CO2 28 29 27   BUN 61* 32* 40*  CREATININE 7.57* 5.34* 6.14*    Microbiology: Results for orders placed or performed during the hospital encounter of 07/12/16  Blood Culture (routine x 2)     Status: None (Preliminary result)   Collection Time: 07/12/16  6:29 PM  Result Value Ref Range Status   Specimen Description BLOOD BLOOD RIGHT FOREARM  Final   Special Requests   Final    BOTTLES DRAWN AEROBIC AND ANAEROBIC Blood Culture adequate volume   Culture NO GROWTH 3 DAYS  Final   Report Status PENDING  Incomplete  Blood Culture (routine x 2)     Status: None (Preliminary result)   Collection Time: 07/12/16  6:29 PM  Result Value Ref Range Status   Specimen Description BLOOD RIGHT ANTECUBITAL  Final   Special Requests   Final    BOTTLES DRAWN AEROBIC AND ANAEROBIC Blood Culture adequate volume   Culture NO  GROWTH 3 DAYS  Final   Report Status PENDING  Incomplete  MRSA PCR Screening     Status: None   Collection Time: 07/13/16  3:32 AM  Result Value Ref Range Status   MRSA by PCR NEGATIVE NEGATIVE Final    Comment:        The GeneXpert MRSA Assay (FDA approved for NASAL specimens only), is one component of a comprehensive MRSA colonization surveillance program. It is not intended to diagnose MRSA infection nor to guide or monitor treatment for MRSA infections.     Studies/Results: Nm Hepato W/eject Fract  Result Date: 07/15/2016 CLINICAL DATA:  Abdominal pain for 2-3 weeks, question acute acalculous cholecystitis EXAM: NUCLEAR MEDICINE HEPATOBILIARY IMAGING WITH GALLBLADDER EF TECHNIQUE: Sequential images of the abdomen were obtained out to 60 minutes following intravenous administration of radiopharmaceutical. After oral ingestion of Ensure, gallbladder ejection fraction was determined. At 60 min, normal ejection fraction is greater than 33%. RADIOPHARMACEUTICALS:  5.094 mCi Tc-73m Choletec IV COMPARISON:  None; correlation ultrasound abdomen 07/14/2016 FINDINGS: Normal tracer extraction from bloodstream indicating normal hepatocellular function. Normal excretion of tracer into biliary tree. Gallbladder visualized at 13 min. Small bowel not visualized until following fatty meal stimulation No hepatic retention of tracer. Subjectively decreased emptying of tracer from gallbladder following fatty meal stimulation. Calculated gallbladder ejection fraction is 21%, abnormally low. Patient reported no symptoms following Ensure ingestion. Normal gallbladder ejection fraction following Ensure ingestion is greater than 33% at 1 hour. IMPRESSION: Patent biliary tree. Abnormal gallbladder response to fatty meal stimulation with a decreased gallbladder ejection fraction of 21%. Electronically Signed   By: MLavonia DanaM.D.   On: 07/15/2016 11:26   UKoreaAbdomen Limited Ruq  Result Date: 07/14/2016 CLINICAL  DATA:  Elevated bilirubin level. EXAM: ULTRASOUND ABDOMEN LIMITED RIGHT UPPER QUADRANT COMPARISON:  Ultrasound of July 04, 2006. FINDINGS: Gallbladder: No gallstones are noted. Mild amount of pericholecystic fluid is noted. Gallbladder wall thickening is noted measured at 7 mm. Sludge is noted within gallbladder lumen. No sonographic Murphy's sign is noted. Common bile duct: Diameter: 2.7 mm which is within normal limits. Liver: No focal lesion identified. Within normal limits in parenchymal echogenicity. Heterogeneous abnormality seen adjacent to the pancreas. IMPRESSION: Gallbladder wall thickening and pericholecystic fluid is noted, although no cholelithiasis is noted. Sludge is noted within gallbladder lumen. This may represent acalculous cholecystitis, and HIDA scan is recommended for further evaluation. Ill-defined heterogeneous abnormality seen adjacent to pancreatic body. CT  scan is recommended for further evaluation. Electronically Signed   By: Marijo Conception, M.D.   On: 07/14/2016 09:30    Assessment/Plan: URA HAUSEN is a 61 y.o. male admitted with weakness, fatigue and confusion a week after dc from Rehab following C spine surgery a month ago at the [place]. 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 His CXR shows chronic changes, Doppler shows chronic clot UE, bcx pending.  T bili slightly elevated as is alk phos.    Daugther seems overwhelmed caring for him.   July 11- BP and HR improved. RUQ USS shows possible acalchous cholecystitis. July 12- no fever, BCX neg. Hida neg except for abnormal gallbladder ejection fraction  Recommendations Continue vanco levo and flagyl for now pending culture results Repeat cbc in am  Thank you very much for the consult. Will follow with you.  FITZGERALD, DAVID P   07/15/2016, 1:10 PM

## 2016-07-15 NOTE — Progress Notes (Signed)
Treatment initiated.

## 2016-07-15 NOTE — Progress Notes (Signed)
Per Dr. Thea Alken pt can resume a clear liquid diet at this time.

## 2016-07-15 NOTE — Progress Notes (Signed)
SURGICAL PROGRESS NOTE (cpt [phone])  Hospital Day(s): 3.   Post op day(s):  Marland Kitchen   Interval History: Patient seen and examined, no acute events or new complaints overnight, underwent HIDA imaging and hemodialysis today. Primary complaint continues to be cervical and lumbar back pain. Patient otherwise reports minimal RUQ abdominal pain continues to improve, tolerating CLD and wishes to eat more, denies N/V, fever/chills, CP, or SOB.  Review of Systems:  Constitutional: denies fever, chills  HEENT: denies cough or congestion  Respiratory: denies any shortness of breath  Cardiovascular: denies chest pain or palpitations  Gastrointestinal: abdominal pain, N/V, and bowel function as per interval history Genitourinary: denies burning with urination or urinary frequency Musculoskeletal: denies pain, decreased motor or sensation Integumentary: denies any other rashes or skin discolorations  Neurological: denies HA or vision/hearing changes  Vital signs in last 24 hours: [min-max] current  Temp:  [97.7 F (36.5 C)-98.5 F (36.9 C)] 97.7 F (36.5 C) (07/12 0607) Pulse Rate:  [70-87] 87 (07/11 2130) Resp:  [17-18] 18 (07/11 2130) BP: (128-152)/(84-94) 128/86 (07/11 2130) SpO2:  [98 %-99 %] 98 % (07/11 2130)     Height: 5\' 10"  (177.8 cm) Weight: 171 lb 15.3 oz (78 kg) BMI (Calculated): 24.2   Intake/Output this shift:  No intake/output data recorded.   Intake/Output last 2 shifts:  @IOLAST2SHIFTS @   Physical Exam:  Constitutional: alert, cooperative and no distress  HENT: normocephalic without obvious abnormality  Eyes: PERRL, EOM's grossly intact and symmetric  Neuro: CN II - XII grossly intact and symmetric without deficit  Respiratory: breathing non-labored at rest  Cardiovascular: regular rate and sinus rhythm  Gastrointestinal: soft, improved minimal RUQ abdominal tenderness to even deep palpation, and non-distended  Musculoskeletal: UE and LE FROM, motor and sensation grossly  intact  Labs:  CBC Latest Ref Rng & Units 07/14/2016 07/13/2016 07/12/2016  WBC 3.8 - 10.6 K/uL 15.8(H) 18.6(H) 23.4(H)  Hemoglobin 13.0 - 18.0 g/dL 11.1(L) 10.0(L) 10.4(L)  Hematocrit 40.0 - 52.0 % 34.0(L) 30.4(L) 31.3(L)  Platelets 150 - 440 K/uL 393 317 344   CMP Latest Ref Rng & Units 07/15/2016 07/14/2016 07/13/2016  Glucose 65 - 99 mg/dL 81 80 102(H)  BUN 6 - 20 mg/dL 40(H) 32(H) 61(H)  Creatinine 0.61 - 1.24 mg/dL 6.14(H) 5.34(H) 7.57(H)  Sodium 135 - 145 mmol/L 136 138 137  Potassium 3.5 - 5.1 mmol/L 4.3 4.3 4.3  Chloride 101 - 111 mmol/L 98(L) 97(L) 99(L)  CO2 22 - 32 mmol/L 27 29 28   Calcium 8.9 - 10.3 mg/dL 10.0 10.5(H) 9.6  Total Protein 6.5 - 8.1 g/dL 7.5 8.5(H) -  Total Bilirubin 0.3 - 1.2 mg/dL 1.9(H) 1.7(H) -  Alkaline Phos 38 - 126 U/L 326(H) 269(H) -  AST 15 - 41 U/L 29 30 -  ALT 17 - 63 U/L 32 36 -   Imaging studies:  HIDA (07/15/2016) Normal tracer extraction from bloodstream indicating normal hepatocellular function.  Normal excretion of tracer into biliary tree.  Gallbladder visualized at 13 min.  Small bowel not visualized until following fatty meal stimulation  No hepatic retention of tracer.  Subjectively decreased emptying of tracer from gallbladder following fatty meal stimulation.  Calculated gallbladder ejection fraction is 21% (normal gallbladder ejection fraction following Ensure ingestion is greater than 33% at 1 hour).  Assessment/Plan: (ICD-10's: K81.9) 61 y.o. 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   - IV antibiotics             - clear liquids diet for now  - check am WBC and LFT's  - anticipate low fat diet upon decreasing WBC and bilirubin with resolution of RUQ pain             - risks of likely open cholecystectomy for this patient not indicated for mild biliary dyskinesia without cholecystitis per HIDA imaging today             - medical management of comorbidities per primary medical team             - DVT prophylaxis  All of the above findings and recommendations were discussed with the patient and his family, and all of patient's and his family's questions were answered to their expressed satisfaction.  Thank you for the opportunity to participate in this patient's care.  -- Marilynne Drivers Rosana Hoes, MD, [place]: [facility] - Partnering for exceptional care. Office: [phone]

## 2016-07-15 NOTE — Progress Notes (Signed)
Post hemodialysis treatment.

## 2016-07-15 NOTE — Progress Notes (Signed)
Pre-Hemodialysis 

## 2016-07-15 NOTE — Progress Notes (Signed)
Pre-hemodialysis 

## 2016-07-15 NOTE — Progress Notes (Signed)
Hemodialysis completed. 

## 2016-07-15 NOTE — Progress Notes (Signed)
[facility] [facility] [place] NAME: William Carpenter    MR#:  [id number]  DATE OF BIRTH:  1955-10-27  SUBJECTIVE:  CHIEF COMPLAINT:   Chief Complaint  Patient presents with  . Fatigue  . Weakness   Weakness. No complains. REVIEW OF SYSTEMS:  Review of Systems  Constitutional: Positive for malaise/fatigue. Negative for chills and fever.  HENT: Negative for sore throat.   Eyes: Negative for blurred vision and double vision.  Respiratory: Negative for cough, shortness of breath and stridor.   Cardiovascular: Negative for chest pain and leg swelling.  Gastrointestinal: Negative for abdominal pain, blood in stool, diarrhea, melena, nausea and vomiting.  Genitourinary: Negative for dysuria and hematuria.  Musculoskeletal: Negative for back pain.  Skin: Negative for itching and rash.  Neurological: Positive for weakness. Negative for dizziness, focal weakness, loss of consciousness and headaches.  Psychiatric/Behavioral: Negative for depression. The patient is not nervous/anxious.     DRUG ALLERGIES:   Allergies  Allergen Reactions  . Albumin (Human) Hives  . Aloe   . Iodinated Diagnostic Agents   . Iron Dextran   . Metoprolol Tartrate   . Penicillins Swelling  . Quinine Sulfate [Quinine]   . Rabeprazole   . Sulfa Antibiotics Other (See Comments)  . Trimethoprim    VITALS:  Blood pressure (!) 148/94, pulse 74, temperature 97.8 F (36.6 C), temperature source Oral, resp. rate 18, height 5\' 10"  (1.778 m), weight 78 kg (171 lb 15.3 oz), SpO2 97 %. PHYSICAL EXAMINATION:  Physical Exam  Constitutional: He is oriented to person, place, and time and well-developed, well-nourished, and in no distress.  HENT:  Head: Normocephalic.  Eyes: Conjunctivae and EOM are normal. No scleral icterus.  Neck: Normal range of motion. Neck supple. No JVD present. No tracheal deviation present.  Cardiovascular: Normal rate and normal heart sounds.  Exam  reveals no gallop.   No murmur heard. Pulmonary/Chest: Effort normal and breath sounds normal. No respiratory distress. He has no wheezes. He has no rales.  Abdominal: Soft. Bowel sounds are normal. He exhibits no distension. There is tenderness (ruq tender).  Musculoskeletal: Normal range of motion. He exhibits no edema or tenderness.  Neurological: He is alert and oriented to person, place, and time. No cranial nerve deficit.  Skin: No rash noted. No erythema.  Psychiatric: Affect normal.   LABORATORY PANEL:  Male CBC  Recent Labs Lab 07/14/16 1018  WBC 15.8*  HGB 11.1*  HCT 34.0*  PLT 393   ------------------------------------------------------------------------------------------------------------------ Chemistries   Recent Labs Lab 07/15/16 0340  NA 136  K 4.3  CL 98*  CO2 27  GLUCOSE 81  BUN 40*  CREATININE 6.14*  CALCIUM 10.0  AST 29  ALT 32  ALKPHOS 326*  BILITOT 1.9*   RADIOLOGY:  Nm Hepato W/eject Fract  Result Date: 07/15/2016 CLINICAL DATA:  Abdominal pain for 2-3 weeks, question acute acalculous cholecystitis EXAM: NUCLEAR MEDICINE HEPATOBILIARY IMAGING WITH GALLBLADDER EF TECHNIQUE: Sequential images of the abdomen were obtained out to 60 minutes following intravenous administration of radiopharmaceutical. After oral ingestion of Ensure, gallbladder ejection fraction was determined. At 60 min, normal ejection fraction is greater than 33%. RADIOPHARMACEUTICALS:  5.094 mCi Tc-65m  Choletec IV COMPARISON:  None; correlation ultrasound abdomen 07/14/2016 FINDINGS: Normal tracer extraction from bloodstream indicating normal hepatocellular function. Normal excretion of tracer into biliary tree. Gallbladder visualized at 13 min. Small bowel not visualized until following fatty meal stimulation No hepatic retention of tracer. Subjectively decreased emptying  of tracer from gallbladder following fatty meal stimulation. Calculated gallbladder ejection fraction is 21%,  abnormally low. Patient reported no symptoms following Ensure ingestion. Normal gallbladder ejection fraction following Ensure ingestion is greater than 33% at 1 hour. IMPRESSION: Patent biliary tree. Abnormal gallbladder response to fatty meal stimulation with a decreased gallbladder ejection fraction of 21%. Electronically Signed   By: Lavonia Dana M.D.   On: 07/15/2016 11:26   ASSESSMENT AND PLAN:   * Leukocytosis- acalculous cholecystitis  on broad-spectrum antibiotic, blood cultures are sent. Patient cannot give urine sample as he does not produce urine. Chest x-ray is negative. F/u  CBC, blood culture, Infectious disease and Rheumatology consult. Appreciated surgery consult.  HIDA scan done/ further decision by surgery.  * ESRD on HD  HD per Dr. Candiss Norse.  * Hypotension   Hold HTN  meds, monitor.  * Autoimmune skin disorder   advise to follow with dermatology.  * Elevated bilirubin  right upper quadrant ultrasound study shows acalculous cholecystitis, surgery consult.  All the records are reviewed and case discussed with Care Management/Social Worker. Management plans discussed with the patient, family and they are in agreement.  CODE STATUS: Full Code  TOTAL TIME TAKING CARE OF THIS PATIENT: 33 minutes.   More than 50% of the time was spent in counseling/coordination of care: YES  POSSIBLE D/C IN 2 DAYS, DEPENDING ON CLINICAL CONDITION.   Vaughan Basta M.D on 07/15/2016 at 3:17 PM  Between 7am to 6pm - Pager - [phone]  After 6pm go to [email or web address] - Proofreader  [facility]  Office  [phone]  CC: Primary care physician; Raelyn Mora, MD  Note: This dictation was prepared with Dragon dictation along with smaller phrase technology. Any transcriptional errors that result from this process are unintentional.

## 2016-07-16 ENCOUNTER — Inpatient Hospital Stay: Payer: Medicare Other

## 2016-07-16 DIAGNOSIS — M79601 Pain in right arm: Secondary | ICD-10-CM

## 2016-07-16 DIAGNOSIS — R600 Localized edema: Secondary | ICD-10-CM

## 2016-07-16 DIAGNOSIS — N186 End stage renal disease: Secondary | ICD-10-CM

## 2016-07-16 LAB — COMPREHENSIVE METABOLIC PANEL
ALBUMIN: 2.5 g/dL — AB (ref 3.5–5.0)
ALK PHOS: 342 U/L — AB (ref 38–126)
ALT: 31 U/L (ref 17–63)
AST: 27 U/L (ref 15–41)
Anion gap: 9 (ref 5–15)
BUN: 21 mg/dL — ABNORMAL HIGH (ref 6–20)
CALCIUM: 9.7 mg/dL (ref 8.9–10.3)
CO2: 29 mmol/L (ref 22–32)
CREATININE: 4.08 mg/dL — AB (ref 0.61–1.24)
Chloride: 94 mmol/L — ABNORMAL LOW (ref 101–111)
GFR calc Af Amer: 17 mL/min — ABNORMAL LOW (ref >60)
GFR calc non Af Amer: 14 mL/min — ABNORMAL LOW (ref >60)
GLUCOSE: 77 mg/dL (ref 65–99)
Potassium: 3.7 mmol/L (ref 3.5–5.1)
SODIUM: 132 mmol/L — AB (ref 135–145)
Total Bilirubin: 1.7 mg/dL — ABNORMAL HIGH (ref 0.3–1.2)
Total Protein: 7.6 g/dL (ref 6.5–8.1)

## 2016-07-16 LAB — CBC
HCT: 30.3 % — ABNORMAL LOW (ref 40.0–52.0)
HEMOGLOBIN: 10.1 g/dL — AB (ref 13.0–18.0)
MCH: 34.3 pg — ABNORMAL HIGH (ref 26.0–34.0)
MCHC: 33.3 g/dL (ref 32.0–36.0)
MCV: 103.3 fL — ABNORMAL HIGH (ref 80.0–100.0)
Platelets: 309 10*3/uL (ref 150–440)
RBC: 2.94 MIL/uL — ABNORMAL LOW (ref 4.40–5.90)
RDW: 14.7 % — ABNORMAL HIGH (ref 11.5–14.5)
WBC: 10.7 10*3/uL — ABNORMAL HIGH (ref 3.8–10.6)

## 2016-07-16 LAB — BLOOD GAS, VENOUS
Acid-Base Excess: 1.9 mmol/L (ref 0.0–2.0)
Bicarbonate: 30.8 mmol/L — ABNORMAL HIGH (ref 20.0–28.0)
PATIENT TEMPERATURE: 37
PCO2 VEN: 67 mmHg — AB (ref 44.0–60.0)
pH, Ven: 7.27 (ref 7.250–7.430)

## 2016-07-16 MED ORDER — CARVEDILOL 25 MG PO TABS
25.0000 mg | ORAL_TABLET | Freq: Two times a day (BID) | ORAL | Status: DC
  Administered 2016-07-16 – 2016-07-17 (×3): 25 mg via ORAL
  Filled 2016-07-16 (×3): qty 1

## 2016-07-16 MED ORDER — LEVOFLOXACIN 500 MG PO TABS
500.0000 mg | ORAL_TABLET | ORAL | Status: DC
  Administered 2016-07-16: 500 mg via ORAL
  Filled 2016-07-16: qty 1

## 2016-07-16 NOTE — Consult Note (Signed)
[facility] Vascular Consult Note  MRN : [id number]  William Carpenter is a 61 y.o. (1955-10-09) male who presents with chief complaint of  Chief Complaint  Patient presents with  . Fatigue  . Weakness  .  History of Present Illness: I am asked to evaluate the patient by Dr. Candiss Norse he personally contacted me and asked me to assess the patient's left arm.  The patient is a 61 year old gentleman with multiple medical problems who presented to the hospital with increasing fatigue weakness and leukocytosis. His primary complaint at that time was right upper quadrant pain and is being evaluated for acalculous cholecystitis.  Today Dr. Candiss Norse noted his left arm is increased in size and the patient is complaining of increased pain. 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  Current Facility-Administered Medications  Medication Dose Route Frequency Provider Last Rate Last Dose  . acetaminophen (TYLENOL) tablet 975 mg  975 mg Oral Q8H Vaughan Basta, MD   975 mg at 07/16/16 1506  . atorvastatin (LIPITOR) tablet 20 mg  20 mg Oral Daily Vaughan Basta, MD   20 mg at 07/16/16 0849  . bisacodyl (DULCOLAX) suppository 10 mg  10 mg Rectal Daily PRN Vaughan Basta, MD   10 mg at 07/13/16 2049  . calcium acetate (PHOSLO) capsule 667 mg  667 mg Oral TID WC Murlean Iba, MD   667 mg at 07/16/16 1622  . carvedilol (COREG) tablet 25 mg  25 mg Oral BID WC Vaughan Basta, MD   25 mg at 07/16/16 1621  . cinacalcet (SENSIPAR) tablet 90 mg  90 mg Oral Daily Murlean Iba, MD   90 mg at 07/16/16 1048  . cloNIDine (CATAPRES) tablet 0.1 mg  0.1 mg Oral TID Vaughan Basta, MD   0.1 mg at 07/16/16 1622  . docusate sodium (COLACE) capsule 100 mg  100 mg Oral BID PRN Vaughan Basta, MD      . gabapentin (NEURONTIN) capsule 100 mg  100 mg Oral QHS Vaughan Basta, MD   100 mg  at 07/15/16 2148  . heparin injection 5,000 Units  5,000 Units Subcutaneous Q8H Vaughan Basta, MD   5,000 Units at 07/16/16 0610  . levofloxacin (LEVAQUIN) tablet 500 mg  500 mg Oral Q48H Hallaji, Sheema M, RPH      . loratadine (CLARITIN) tablet 10 mg  10 mg Oral Daily Vaughan Basta, MD   10 mg at 07/16/16 0850  . methocarbamol (ROBAXIN) tablet 500 mg  500 mg Oral Q12H Vaughan Basta, MD   500 mg at 07/16/16 0848  . metroNIDAZOLE (FLAGYL) tablet 500 mg  500 mg Oral Q8H Leonel Ramsay, MD   500 mg at 07/16/16 1505  . multivitamin (RENA-VIT) tablet 1 tablet  1 tablet Oral QHS Vaughan Basta, MD   1 tablet at 07/15/16 2147  . naproxen (NAPROSYN) tablet 500 mg  500 mg Oral BID WC Vaughan Basta, MD   500 mg at 07/16/16 1622  . oxyCODONE (Oxy IR/ROXICODONE) immediate release tablet 5-10 mg  5-10 mg Oral Q4H PRN Vaughan Basta, MD   5 mg at 07/16/16 1041  . pantoprazole (PROTONIX) EC tablet 40 mg  40 mg Oral Daily Vaughan Basta, MD   40 mg at 07/16/16 0849  . senna-docusate (Senokot-S) tablet 1 tablet  1 tablet Oral BID Vaughan Basta, MD   1 tablet at 07/16/16 0848  . sertraline (ZOLOFT) tablet 150 mg  150  mg Oral QHS Vaughan Basta, MD   150 mg at 07/15/16 2148  . simethicone (MYLICON) chewable tablet 80 mg  80 mg Oral TID Vaughan Basta, MD   80 mg at 07/16/16 1512  . timolol (BETIMOL) 0.5 % ophthalmic solution 1 drop  1 drop Both Eyes Daily Vaughan Basta, MD   1 drop at 07/16/16 1509  . traZODone (DESYREL) tablet 100 mg  100 mg Oral QHS Vaughan Basta, MD   100 mg at 07/15/16 2148    Past Medical History:  Diagnosis Date  . Autoimmune disease ([place])    skin problem  . Dysphagia   . FSGS (focal segmental glomerulosclerosis)   . Hyperlipidemia   . Muscle weakness (generalized)   . Renal disorder    end stage   . Spinal stenosis, cervical region     Past Surgical History:  Procedure  Laterality Date  . SPINAL FUSION  2018    Social History Social History  Substance Use Topics  . Smoking status: Never Smoker  . Smokeless tobacco: Never Used  . Alcohol use No    Family History Family History  Problem Relation Age of Onset  . Hypertension Mother   No family history of bleeding/clotting disorders, porphyria or stroke   Allergies  Allergen Reactions  . Albumin (Human) Hives  . Aloe   . Iodinated Diagnostic Agents   . Iron Dextran   . Metoprolol Tartrate   . Penicillins Swelling  . Quinine Sulfate [Quinine]   . Rabeprazole   . Sulfa Antibiotics Other (See Comments)  . Trimethoprim      REVIEW OF SYSTEMS (Negative unless checked)  Constitutional: [] Weight loss  [] Fever  [] Chills Cardiac: [] Chest pain   [] Chest pressure   [] Palpitations   [] Shortness of breath when laying flat   [] Shortness of breath at rest   [] Shortness of breath with exertion. Vascular:  [] Pain in legs with walking   [] Pain in legs at rest   [] Pain in legs when laying flat   [] Claudication   [] Pain in feet when walking  [] Pain in feet at rest  [] Pain in feet when laying flat   [] History of DVT   [] Phlebitis   [] Swelling in legs   [] Varicose veins   [] Non-healing ulcers Pulmonary:   [] Uses home oxygen   [] Productive cough   [] Hemoptysis   [] Wheeze  [] COPD   [] Asthma Neurologic:  [] Dizziness  [] Blackouts   [] Seizures   [] History of stroke   [] History of TIA  [] Aphasia   [] Temporary blindness   [] Dysphagia   [] Weakness or numbness in arms   [] Weakness or numbness in legs Musculoskeletal:  [] Arthritis   [] Joint swelling   [] Joint pain   [] Low back pain Hematologic:  [] Easy bruising  [] Easy bleeding   [] Hypercoagulable state   [] Anemic  [] Hepatitis Gastrointestinal:  [] Blood in stool   [] Vomiting blood  [] Gastroesophageal reflux/heartburn   [] Difficulty swallowing. Genitourinary:  [x] Chronic kidney disease   [] Difficult urination  [] Frequent urination  [] Burning with urination   [] Blood in  urine Skin:  [] Rashes   [] Ulcers   [] Wounds Psychological:  [] History of anxiety   []  History of major depression.   Physical Examination  Vitals:   07/15/16 2252 07/15/16 2355 07/16/16 0744 07/16/16 1617  BP:  (!) 168/77 (!) 162/77 137/82  Pulse:  60 65 62  Resp:  18 18 20   Temp:  98.2 F (36.8 C) 98 F (36.7 C) 98.7 F (37.1 C)  TempSrc:  Oral Oral Oral  SpO2: 97% 98%  100% 97%  Weight:      Height:       Body mass index is 24.67 kg/m.  Head: [place]/AT, No temporalis wasting. Prominent temp pulse not noted. Ear/Nose/Throat: Nares w/o erythema or drainage, oropharynx w/o obsrtuction, Mallampati score: 3.   Eyes: PERRLA, Sclera nonicteric.  Neck: Supple, no nuchal rigidity.  No bruit or JVD.  Pulmonary:  Breath sounds equal bilaterally, no use of accessory muscles.  Cardiac: RRR, normal S1, S2, no Murmurs, rubs or gallops. Vascular:Patient has a left wrist fistula which is extensively matured. Fistula has a good thrill good bruit it is not pulsatile His hand is normal as is the shoulder area. The elbow appears to be inflamed and this appears to be specific to the joint. Gastrointestinal: soft, non-tender, non-distended.  Musculoskeletal: Moves all extremities.  No deformity or atrophy. No edema. Neurologic: CN 2-12 intact. Symmetrical.  Speech is fluent.  Psychiatric: Judgment intact, Mood & affect appropriate for pt's clinical situation. Dermatologic: + rashes with multiple ulcers noted.   Lymph : No Cervical,  or Inguinal lymphadenopathy.      CBC Lab Results  Component Value Date   WBC 10.7 (H) 07/16/2016   HGB 10.1 (L) 07/16/2016   HCT 30.3 (L) 07/16/2016   MCV 103.3 (H) 07/16/2016   PLT 309 07/16/2016    BMET    Component Value Date/Time   NA 132 (L) 07/16/2016 0411   NA 141 06/16/2013 2116   K 3.7 07/16/2016 0411   K 4.4 06/16/2013 2116   CL 94 (L) 07/16/2016 0411   CL 103 06/16/2013 2116   CO2 29 07/16/2016 0411   CO2 31 06/16/2013 2116   GLUCOSE 77  07/16/2016 0411   GLUCOSE 77 06/16/2013 2116   BUN 21 (H) 07/16/2016 0411   BUN 38 (H) 06/16/2013 2116   CREATININE 4.08 (H) 07/16/2016 0411   CREATININE 8.82 (H) 06/16/2013 2116   CALCIUM 9.7 07/16/2016 0411   CALCIUM 8.9 06/16/2013 2116   GFRNONAA 14 (L) 07/16/2016 0411   GFRNONAA 6 (L) 06/16/2013 2116   GFRAA 17 (L) 07/16/2016 0411   GFRAA 7 (L) 06/16/2013 2116   Estimated Creatinine Clearance: 19.6 mL/min (A) (by C-G formula based on SCr of 4.08 mg/dL (H)).  COAG No results found for: INR, PROTIME  Assessment/Plan 1.  Swelling and edema of the left arm which appears to be localized to the elbow. The fistula itself appears to be functioning well and there does not appear to be a central venous stenosis or the entire arm shoulder and hand would also be involved. Consideration for joint involvement with his autoimmune disease and/or an infected joint would be possible. At this time I would not recommend any angiography or intervention and would follow up in the office with a duplex ultrasound.  2. ESRD on HD    nephrology on consult continue dialysis.  3.  Acalculous cholecystitis      Patient is being followed by Gen. surgery.  4. Autoimmune skin disorder    wound care consult. Continue to treat the multiple ulcers expectantly  Hortencia Pilar, MD  07/16/2016 6:59 PM

## 2016-07-16 NOTE — Evaluation (Signed)
Physical Therapy Evaluation Patient Details Name: William Carpenter MRN: [id number] DOB: 05-Nov-1955 Today's Date: 07/16/2016   History of Present Illness  presented to ER secondary to progressive weakness, fatigue, hypotension; admitted with leukocytosis related to acalculous cholecystitis.  Of note, patient with newly diagnosed non-occlusive thrombus in L cephalic vein; no plans for treatment per MD Anselm Jungling), okay for participation with session.  Clinical Impression  Upon evaluation, patient alert and oriented; follows all commands and demonstrates fair/good insight and safety awareness.  Bilat UE/LE strength and ROM grossly WFL for basic transfers and mobility; no significant focal, asymmetrical weakness noted.  Completes sit/stand, basic transfers and gait (200') with RW, cga; decreased LE power, decreased reaction time/balance reactions evident.  Requires 23 seconds to complete 5x sit/stand (with UE support), indicative of decreased LE strength/power, increased fall risk with functional mobility.  Do recommend continued use of RW for all functional mobility at this time. Would benefit from skilled PT to address above deficits and promote optimal return to PLOF; Recommend transition to [place] upon discharge from acute hospitalization.     Follow Up Recommendations Home health PT    Equipment Recommendations   (has all necessary equipment at home)    Recommendations for Other Services       Precautions / Restrictions Precautions Precautions: Fall Precaution Comments: No BP L UE Restrictions Weight Bearing Restrictions: No      Mobility  Bed Mobility               General bed mobility comments: seated in recliner beginning/end of treatment session  Transfers Overall transfer level: Needs assistance Equipment used: Rolling walker (2 wheeled) Transfers: Sit to/from Stand Sit to Stand: Min guard         General transfer comment: requires UE support to safely  complete  Ambulation/Gait Ambulation/Gait assistance: Min guard Ambulation Distance (Feet): 200 Feet Assistive device: Rolling walker (2 wheeled)       General Gait Details: reciprocal stepping pattern with fair step height/length, fair cadence. No noted buckling, LOB.  Min cuing for postural extension and walker position  Stairs            Wheelchair Mobility    Modified Rankin (Stroke Patients Only)       Balance Overall balance assessment: Needs assistance Sitting-balance support: Feet supported;No upper extremity supported Sitting balance-Leahy Scale: Normal     Standing balance support: Bilateral upper extremity supported Standing balance-Leahy Scale: Fair                               Pertinent Vitals/Pain Pain Assessment: No/denies pain    Home Living Family/patient expects to be discharged to:: Private residence Living Arrangements: Spouse/significant other Available Help at Discharge: Family;Available PRN/intermittently Type of Home: House         Home Equipment: Walker - 4 wheels;Cane - single point Additional Comments: 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  Wife has aide 3 days/week, 2-3 hours/day.  Patient with recent discharge from [place], set up with [facility] services; however, returned to hospital 3 days after return home.    Prior Function Level of Independence: Independent with assistive device(s)         Comments: Mod indep with basic, household mobility; assist for ADLs due to cervical precautions (cervical decompression approx 6-8 weeks prior per patient/family)  Endorses single fall within previous six months     Hand  Dominance   Dominant Hand: Right    Extremity/Trunk Assessment   Upper Extremity Assessment Upper Extremity Assessment: Overall WFL for tasks assessed (elevation approx 120 degrees, strength at least 4-/5 throughout)    Lower Extremity  Assessment Lower Extremity Assessment: Overall WFL for tasks assessed (grossly 4- to 4/5 throughout; baseline 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  Encouraged to perform outside of therapy as tolerated. PAtient voiced understanding.   Assessment/Plan    PT Assessment Patient needs continued PT services  PT Problem List Decreased strength;Decreased activity tolerance;Decreased balance;Decreased mobility;Decreased knowledge of use of DME;Decreased safety awareness;Decreased knowledge of precautions       PT Treatment Interventions DME instruction;Gait training;Stair training;Functional mobility training;Therapeutic activities;Therapeutic exercise;Balance training;Patient/family education    PT Goals (Current goals can be found in the Care Plan section)  Acute Rehab PT Goals Patient Stated Goal: to return home PT Goal Formulation: With patient/family Time For Goal Achievement: 07/30/16 Potential to Achieve Goals: Good    Frequency Min 2X/week   Barriers to discharge Decreased caregiver support      Co-evaluation               AM-PAC PT "6 Clicks" Daily Activity  Outcome Measure Difficulty turning over in bed (including adjusting bedclothes, sheets and blankets)?: None Difficulty moving from lying on back to sitting on the side of the bed? : None Difficulty sitting down on and standing up from a chair with arms (e.g., wheelchair, bedside commode, etc,.)?: Total Help needed moving to and from a bed to chair (including a  wheelchair)?: A Little Help needed walking in hospital room?: A Little Help needed climbing 3-5 steps with a railing? : A Little 6 Click Score: 18    End of Session Equipment Utilized During Treatment: Gait belt Activity Tolerance: Patient tolerated treatment well Patient left: in chair;with chair alarm set;with family/visitor present;with call bell/phone within reach Nurse Communication: Mobility status PT Visit Diagnosis: Muscle weakness (generalized) (M62.81);Difficulty in walking, not elsewhere classified (R26.2)    Time: 1610-9604 PT Time Calculation (min) (ACUTE ONLY): 27 min   Charges:   PT Evaluation $PT Eval Low Complexity: 1 Procedure PT Treatments $Therapeutic Exercise: 8-22 mins   PT G Codes:        Jafar Poffenberger H. Owens Shark, PT, DPT, NCS 07/16/16, 10:07 PM [phone]

## 2016-07-16 NOTE — Consult Note (Signed)
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 More than 20 years.   Spinal stenosis and cervical area and had undergone decompression surgery and cervical fusion last month. Autoimmune blistering and scabbed lesions are present to arms, hands and legs. Medical adhesive seems to make this worse.  Full thickness pressure injury, resolving to left elbow.  Wife states patient is resistant to offloading and turning, due to pain and this developed in rehab.  Wound type: Inflammatory Pressure Injury POA: Yes Measurement: Left elbow- 4 cm x 4 cm x 0.1 cm healing full thickness pressure injury. Dialysis shunt in left forearm is large and protruding and creates a challenge with bandaging.  Left forearm:  Large blister with sloughing epithelium.  Left knee, sloughing Scattered peeling lesions with scabbing present to arms, legs and hands/fingers.   Wound NDL:OPRAFOA Drainage (amount, consistency, odor) lesions bleed initially and then scab over.  Left arm and left knee blistering is wet and sloughing. Bleeds with cleansing.  Tender to touch.  Periwound: intact Dressing procedure/placement/frequency:Cleanse wound to left elbow with soap and water.  Apply silicone border foam.  Change every other day.   Silicone border foam over calcium alginate (algisite-located in supply room) to any wounds that are draining.   Discontinue Petrolatum ointment to scabbed lesions daily, as family states this made it worse in the past.  Woc team will follow.  Wound care is performed and education provided to patient and family for ongoing needs.  Understand rationale for calcium alginate to absorb and provide for moist wound healing.  Will monitor.  Domenic Moras RN BSN [place] Pager [phone]

## 2016-07-16 NOTE — Progress Notes (Signed)
[place] INFECTIOUS DISEASE PROGRESS NOTE Date of Admission:  07/12/2016     ID: URIAS SHEEK is a 61 y.o. male with leukocytosis Principal Problem:   Leukocytosis Active Problems:   Hypotension  Subjective: No fevers, less abd pain.  Some pain at L elbow  ROS  Eleven systems are reviewed and negative except per hpi  Medications:  Antibiotics Given (last 72 hours)    Date/Time Action Medication Dose Rate   07/13/16 1800 New Bag/Given   vancomycin (VANCOCIN) IVPB 750 mg/150 ml premix 750 mg 150 mL/hr   07/14/16 1530 Given   metroNIDAZOLE (FLAGYL) tablet 500 mg 500 mg    07/14/16 1707 New Bag/Given   levofloxacin (LEVAQUIN) IVPB 500 mg 500 mg 100 mL/hr   07/14/16 2122 Given   metroNIDAZOLE (FLAGYL) tablet 500 mg 500 mg    07/15/16 1607 New Bag/Given   vancomycin (VANCOCIN) IVPB 750 mg/150 ml premix 750 mg 150 mL/hr   07/15/16 1756 Given   metroNIDAZOLE (FLAGYL) tablet 500 mg 500 mg    07/15/16 2149 Given   metroNIDAZOLE (FLAGYL) tablet 500 mg 500 mg    07/16/16 0610 Given   metroNIDAZOLE (FLAGYL) tablet 500 mg 500 mg      . acetaminophen  975 mg Oral Q8H  . atorvastatin  20 mg Oral Daily  . calcium acetate  667 mg Oral TID WC  . carvedilol  25 mg Oral BID WC  . cinacalcet  90 mg Oral Daily  . cloNIDine  0.1 mg Oral TID  . gabapentin  100 mg Oral QHS  . heparin  5,000 Units Subcutaneous Q8H  . loratadine  10 mg Oral Daily  . methocarbamol  500 mg Oral Q12H  . metroNIDAZOLE  500 mg Oral Q8H  . multivitamin  1 tablet Oral QHS  . naproxen  500 mg Oral BID WC  . pantoprazole  40 mg Oral Daily  . senna-docusate  1 tablet Oral BID  . sertraline  150 mg Oral QHS  . simethicone  80 mg Oral TID  . timolol  1 drop Both Eyes Daily  . traZODone  100 mg Oral QHS    Objective: Vital signs in last 24 hours: Temp:  [98 F (36.7 C)-98.2 F (36.8 C)] 98 F (36.7 C) (07/13 0744) Pulse Rate:  [52-67] 65 (07/13 0744) Resp:  [13-20] 18 (07/13 0744) BP:  (155-188)/(77-103) 162/77 (07/13 0744) SpO2:  [97 %-100 %] 100 % (07/13 0744) Constitutional: He is oriented to person, place, and time. He appears well-developed and well-nourished. No distress.  HENT:  Mouth/Throat: Oropharynx is clear and moist. No oropharyngeal exudate.  Cardiovascular: Normal rate, regular rhythm and normal heart sounds.  HD  access RUE wnl - Pulmonary/Chest: Effort normal and breath sounds normal. No respiratory distress. He has no wheezes.  Abdominal: Soft. Bowel sounds are normal. He exhibits no distension. There is midl tenderness.  Lymphadenopathy: He has no cervical adenopathy.  Neurological: He is awake but slowed mentation .  Skin: a few scabs on arms and leg. Ant neck incision site wnl  l elbow with a 1 cm wound with white - yellow exudate. Psychiatric: He has a normal mood and affect. His behavior is normal.   Lab Results  Recent Labs  07/14/16 1018 07/15/16 0340 07/16/16 0411  WBC 15.8*  --  10.7*  HGB 11.1*  --  10.1*  HCT 34.0*  --  30.3*  NA 138 136 132*  K 4.3 4.3 3.7  CL 97* 98* 94*  CO2 29 27 29   BUN 32* 40* 21*  CREATININE 5.34* 6.14* 4.08*    Microbiology: Results for orders placed or performed during the hospital encounter of 07/12/16  Blood Culture (routine x 2)     Status: None (Preliminary result)   Collection Time: 07/12/16  6:29 PM  Result Value Ref Range Status   Specimen Description BLOOD BLOOD RIGHT FOREARM  Final   Special Requests   Final    BOTTLES DRAWN AEROBIC AND ANAEROBIC Blood Culture adequate volume   Culture NO GROWTH 4 DAYS  Final   Report Status PENDING  Incomplete  Blood Culture (routine x 2)     Status: None (Preliminary result)   Collection Time: 07/12/16  6:29 PM  Result Value Ref Range Status   Specimen Description BLOOD RIGHT ANTECUBITAL  Final   Special Requests   Final    BOTTLES DRAWN AEROBIC AND ANAEROBIC Blood Culture adequate volume   Culture NO GROWTH 4 DAYS  Final   Report Status PENDING   Incomplete  MRSA PCR Screening     Status: None   Collection Time: 07/13/16  3:32 AM  Result Value Ref Range Status   MRSA by PCR NEGATIVE NEGATIVE Final    Comment:        The GeneXpert MRSA Assay (FDA approved for NASAL specimens only), is one component of a comprehensive MRSA colonization surveillance program. It is not intended to diagnose MRSA infection nor to guide or monitor treatment for MRSA infections.     Studies/Results: Nm Hepato W/eject Fract  Result Date: 07/15/2016 CLINICAL DATA:  Abdominal pain for 2-3 weeks, question acute acalculous cholecystitis EXAM: NUCLEAR MEDICINE HEPATOBILIARY IMAGING WITH GALLBLADDER EF TECHNIQUE: Sequential images of the abdomen were obtained out to 60 minutes following intravenous administration of radiopharmaceutical. After oral ingestion of Ensure, gallbladder ejection fraction was determined. At 60 min, normal ejection fraction is greater than 33%. RADIOPHARMACEUTICALS:  5.094 mCi Tc-49m Choletec IV COMPARISON:  None; correlation ultrasound abdomen 07/14/2016 FINDINGS: Normal tracer extraction from bloodstream indicating normal hepatocellular function. Normal excretion of tracer into biliary tree. Gallbladder visualized at 13 min. Small bowel not visualized until following fatty meal stimulation No hepatic retention of tracer. Subjectively decreased emptying of tracer from gallbladder following fatty meal stimulation. Calculated gallbladder ejection fraction is 21%, abnormally low. Patient reported no symptoms following Ensure ingestion. Normal gallbladder ejection fraction following Ensure ingestion is greater than 33% at 1 hour. IMPRESSION: Patent biliary tree. Abnormal gallbladder response to fatty meal stimulation with a decreased gallbladder ejection fraction of 21%. Electronically Signed   By: MLavonia DanaM.D.   On: 07/15/2016 11:26   UKoreaLt Upper Extrem Ltd Soft Tissue Non Vascular  Result Date: 07/16/2016 CLINICAL DATA:  New area of  swelling in the proximal forearm. EXAM: ULTRASOUND left UPPER EXTREMITY LIMITED TECHNIQUE: Ultrasound examination of the upper extremity soft tissues was performed in the area of clinical concern. COMPARISON:  Prior study 07/12/2016 FINDINGS: Stable small amount of nonocclusive clot in the cephalic vein. Left upper extremity dialysis fistula appears grossly patent. No mass or fluid collection in the area of new swelling. Persistent hematoma in the left shoulder area measures 5.5 x 2.4 x 4.4 cm. IMPRESSION: Stable patent appearing dialysis fistula. Small amount of nonocclusive clot noted in the cephalic vein. Persistent hematoma in the left shoulder area. No obvious hematoma or fluid collection in the new area of swelling in the left proximal forearm. Electronically Signed   By: PRicky StabsD.  On: 07/16/2016 12:49    Assessment/Plan: BARTLOMIEJ JENKINSON is a 61 y.o. male admitted with weakness, fatigue and confusion a week after dc from Rehab following C spine surgery a month ago at the [place]. 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 His CXR shows chronic changes, Doppler shows chronic clot UE, bcx pending.  T bili slightly elevated as is alk phos.    Daugther seems overwhelmed caring for him.  July 11- BP and HR improved. RUQ USS shows possible acalchous cholecystitis. July 12- no fever, BCX neg. Hida neg except for abnormal gallbladder ejection fraction July 13- no fevers, wbc down to 10. Tolerating diet. Day 5 of abx. Some L elbow pain and swelling.- has wound there I cultured today. [place] does not show abscess. Getting CT scan  Recommendations FU CT of elbow  Can dc vanco as bcx negative, MRSA PCR negative Continue  levo and flagyl for presumed improving cholecystitis and wound infections Would rec a 10 day total course - can be oral  FU on wound cx  Thank you very much for the consult. Will follow with you.  Brumley, Inza Mikrut P   07/16/2016,  2:53 PM

## 2016-07-16 NOTE — Progress Notes (Signed)
[facility] [place] NAME: William Carpenter    MR#:  [id number]  DATE OF BIRTH:  Apr 07, 1955  SUBJECTIVE:  CHIEF COMPLAINT:   Chief Complaint  Patient presents with  . Fatigue  . Weakness   Weakness. C/o swelling on left elbow abd pain is getting better.  REVIEW OF SYSTEMS:  Review of Systems  Constitutional: Positive for malaise/fatigue. Negative for chills and fever.  HENT: Negative for sore throat.   Eyes: Negative for blurred vision and double vision.  Respiratory: Negative for cough, shortness of breath and stridor.   Cardiovascular: Negative for chest pain and leg swelling.  Gastrointestinal: Negative for abdominal pain, blood in stool, diarrhea, melena, nausea and vomiting.  Genitourinary: Negative for dysuria and hematuria.  Musculoskeletal: Negative for back pain.  Skin: Negative for itching and rash.  Neurological: Positive for weakness. Negative for dizziness, focal weakness, loss of consciousness and headaches.  Psychiatric/Behavioral: Negative for depression. The patient is not nervous/anxious.     DRUG ALLERGIES:   Allergies  Allergen Reactions  . Albumin (Human) Hives  . Aloe   . Iodinated Diagnostic Agents   . Iron Dextran   . Metoprolol Tartrate   . Penicillins Swelling  . Quinine Sulfate [Quinine]   . Rabeprazole   . Sulfa Antibiotics Other (See Comments)  . Trimethoprim    VITALS:  Blood pressure (!) 162/77, pulse 65, temperature 98 F (36.7 C), temperature source Oral, resp. rate 18, height 5\' 10"  (1.778 m), weight 78 kg (171 lb 15.3 oz), SpO2 100 %. PHYSICAL EXAMINATION:  Physical Exam  Constitutional: He is oriented to person, place, and time and well-developed, well-nourished, and in no distress.  HENT:  Head: Normocephalic.  Eyes: Conjunctivae and EOM are normal. No scleral icterus.  Neck: Normal range of motion. Neck supple. No JVD present. No tracheal deviation present.  Cardiovascular:  Normal rate and normal heart sounds.  Exam reveals no gallop.   No murmur heard. Pulmonary/Chest: Effort normal and breath sounds normal. No respiratory distress. He has no wheezes. He has no rales.  Abdominal: Soft. Bowel sounds are normal. He exhibits no distension. There is tenderness (ruq tender).  Musculoskeletal: Normal range of motion. He exhibits no edema or tenderness.  Left elbow and forearm have swelling, but no tenderness. Distal pulses are palpable and able to move all fingers and hands.  Neurological: He is alert and oriented to person, place, and time. No cranial nerve deficit.  Skin: No rash noted. No erythema.  Psychiatric: Affect normal.   LABORATORY PANEL:  Male CBC  Recent Labs Lab 07/16/16 0411  WBC 10.7*  HGB 10.1*  HCT 30.3*  PLT 309   ------------------------------------------------------------------------------------------------------------------ Chemistries   Recent Labs Lab 07/16/16 0411  NA 132*  K 3.7  CL 94*  CO2 29  GLUCOSE 77  BUN 21*  CREATININE 4.08*  CALCIUM 9.7  AST 27  ALT 31  ALKPHOS 342*  BILITOT 1.7*   RADIOLOGY:  [place] Lt Upper Extrem Ltd Soft Tissue Non Vascular  Result Date: 07/16/2016 CLINICAL DATA:  New area of swelling in the proximal forearm. EXAM: ULTRASOUND left UPPER EXTREMITY LIMITED TECHNIQUE: Ultrasound examination of the upper extremity soft tissues was performed in the area of clinical concern. COMPARISON:  Prior study 07/12/2016 FINDINGS: Stable small amount of nonocclusive clot in the cephalic vein. Left upper extremity dialysis fistula appears grossly patent. No mass or fluid collection in the area of new swelling. Persistent hematoma in the left shoulder  area measures 5.5 x 2.4 x 4.4 cm. IMPRESSION: Stable patent appearing dialysis fistula. Small amount of nonocclusive clot noted in the cephalic vein. Persistent hematoma in the left shoulder area. No obvious hematoma or fluid collection in the new area of swelling in  the left proximal forearm. Electronically Signed   By: Marijo Sanes M.D.   On: 07/16/2016 12:49   ASSESSMENT AND PLAN:   * Leukocytosis- acalculous cholecystitis  on broad-spectrum antibiotic, blood cultures are sent. Patient cannot give urine sample as he does not produce urine. Chest x-ray is negative. F/u  CBC, blood culture, Infectious disease and Rheumatology consult. Appreciated surgery consult.  HIDA scan done/ no need for surgery for now.  may be a component of cellulitis with his skin lesions, but now symptomatically much better.  * ESRD on HD  HD per Dr. Candiss Norse.  * Hypotension   Hold HTN  meds, monitor.  * Autoimmune skin disorder   advise to follow with dermatology.  * Elevated bilirubin  right upper quadrant ultrasound study shows acalculous cholecystitis, surgery consult.  * Fore arm swelling   US doppler and CT elbow to r/o vascular and joint pathologies.  All the records are reviewed and case discussed with Care Management/Social Worker. Management plans discussed with the patient, family and they are in agreement.  CODE STATUS: Full Code  TOTAL TIME TAKING CARE OF THIS PATIENT: 33 minutes.   More than 50% of the time was spent in counseling/coordination of care: YES  POSSIBLE D/C IN 2 DAYS, DEPENDING ON CLINICAL CONDITION.   Vaughan Basta M.D on 07/16/2016 at 2:22 PM  Between 7am to 6pm - Pager - [phone]  After 6pm go to [email or web address] - Proofreader  [facility]  Office  [phone]  CC: Primary care physician; Raelyn Mora, MD  Note: This dictation was prepared with Dragon dictation along with smaller phrase technology. Any transcriptional errors that result from this process are unintentional.

## 2016-07-16 NOTE — Care Management Important Message (Signed)
Important Message  Patient Details  Name: William Carpenter MRN: [id number] Date of Birth: [birth year]   Medicare Important Message Given:  Yes    Jolly Mango, RN 07/16/2016, 12:03 PM

## 2016-07-16 NOTE — Progress Notes (Signed)
ANTIBIOTIC CONSULT NOTE - INITIAL  Pharmacy Consult for Levaquin Indication: wound infection  Allergies  Allergen Reactions  . Albumin (Human) Hives  . Aloe   . Iodinated Diagnostic Agents   . Iron Dextran   . Metoprolol Tartrate   . Penicillins Swelling  . Quinine Sulfate [Quinine]   . Rabeprazole   . Sulfa Antibiotics Other (See Comments)  . Trimethoprim     Patient Measurements: Height: 5\' 10"  (177.8 cm) Weight: 171 lb 15.3 oz (78 kg) IBW/kg (Calculated) : 73 Adjusted Body Weight: 74.3 kg   Vital Signs: Temp: 98 F (36.7 C) (07/13 0744) Temp Source: Oral (07/13 0744) BP: 162/77 (07/13 0744) Pulse Rate: 65 (07/13 0744) Intake/Output from previous day: 07/12 0701 - 07/13 0700 In: 590 [P.O.:440; IV Piggyback:150] Out: 0  Intake/Output from this shift: No intake/output data recorded.  Labs:  Recent Labs  07/14/16 1018 07/15/16 0340 07/16/16 0411  WBC 15.8*  --  10.7*  HGB 11.1*  --  10.1*  PLT 393  --  309  CREATININE 5.34* 6.14* 4.08*   Estimated Creatinine Clearance: 19.6 mL/min (A) (by C-G formula based on SCr of 4.08 mg/dL (H)). No results for input(s): VANCOTROUGH, VANCOPEAK, VANCORANDOM, GENTTROUGH, GENTPEAK, GENTRANDOM, TOBRATROUGH, TOBRAPEAK, TOBRARND, AMIKACINPEAK, AMIKACINTROU, AMIKACIN in the last 72 hours.   Microbiology: Recent Results (from the past 720 hour(s))  Blood Culture (routine x 2)     Status: None (Preliminary result)   Collection Time: 07/12/16  6:29 PM  Result Value Ref Range Status   Specimen Description BLOOD BLOOD RIGHT FOREARM  Final   Special Requests   Final    BOTTLES DRAWN AEROBIC AND ANAEROBIC Blood Culture adequate volume   Culture NO GROWTH 4 DAYS  Final   Report Status PENDING  Incomplete  Blood Culture (routine x 2)     Status: None (Preliminary result)   Collection Time: 07/12/16  6:29 PM  Result Value Ref Range Status   Specimen Description BLOOD RIGHT ANTECUBITAL  Final   Special Requests   Final    BOTTLES  DRAWN AEROBIC AND ANAEROBIC Blood Culture adequate volume   Culture NO GROWTH 4 DAYS  Final   Report Status PENDING  Incomplete  MRSA PCR Screening     Status: None   Collection Time: 07/13/16  3:32 AM  Result Value Ref Range Status   MRSA by PCR NEGATIVE NEGATIVE Final    Comment:        The GeneXpert MRSA Assay (FDA approved for NASAL specimens only), is one component of a comprehensive MRSA colonization surveillance program. It is not intended to diagnose MRSA infection nor to guide or monitor treatment for MRSA infections.     Medical History: Past Medical History:  Diagnosis Date  . Autoimmune disease ([place])    skin problem  . Dysphagia   . FSGS (focal segmental glomerulosclerosis)   . Hyperlipidemia   . Muscle weakness (generalized)   . Renal disorder    end stage   . Spinal stenosis, cervical region     Medications:  Prescriptions Prior to Admission  Medication Sig Dispense Refill Last Dose  . acetaminophen (TYLENOL) 325 MG tablet Take 975 mg by mouth every 8 (eight) hours.   07/09/2016 at 0900  . atorvastatin (LIPITOR) 20 MG tablet Take 20 mg by mouth daily.   07/08/2016 at unknown  . b complex-vitamin c-folic acid (NEPHRO-VITE) 0.8 MG TABS tablet Take 1 tablet by mouth at bedtime.   07/08/2016 at 1800  . bisacodyl (DULCOLAX) 10  MG suppository Place 10 mg rectally daily as needed for moderate constipation.   prn at prn  . calcium acetate (PHOSLO) 667 MG capsule Take 667 mg by mouth 3 (three) times daily with meals.   07/09/2016 at 0900  . Carboxymethylcellul-Glycerin (REFRESH OPTIVE SENSITIVE OP) Place 1 drop into both eyes every 4 (four) hours.   unknown at unknown  . carvedilol (COREG) 25 MG tablet Take 25 mg by mouth 2 (two) times daily with a meal.   07/09/2016 at 0900  . cetirizine (ZYRTEC) 10 MG tablet Take 10 mg by mouth daily.   07/08/2016 at 1800  . cinacalcet (SENSIPAR) 90 MG tablet Take 90 mg by mouth daily.   07/08/2016 at 1800  . cloNIDine (CATAPRES) 0.1 MG tablet  Take 0.1 mg by mouth every 4 (four) hours. Take 1 tablet by mouth every 4 hours as needed for SBP over 180   prn at prn  . gabapentin (NEURONTIN) 100 MG capsule Take 100 mg by mouth at bedtime.   07/08/2016 at 2100  . hydrALAZINE (APRESOLINE) 10 MG tablet Take 10 mg by mouth 2 (two) times daily.   07/09/2016 at 0900  . methocarbamol (ROBAXIN) 500 MG tablet Take 500 mg by mouth every 12 (twelve) hours.   07/09/2016 at 0500  . naproxen (NAPROSYN) 500 MG tablet Take 500 mg by mouth 2 (two) times daily with a meal.   07/09/2016 at 0900  . omeprazole (PRILOSEC) 20 MG capsule Take 20 mg by mouth 2 (two) times daily before a meal.   07/09/2016 at 0630  . oxycodone (OXY-IR) 5 MG capsule Take 5-10 mg by mouth every 4 (four) hours as needed.   prn at prn  . polyethylene glycol (MIRALAX / GLYCOLAX) packet Take 17 g by mouth daily.   07/09/2016 at 0900  . Psyllium (KONSYL-D PO) Take 5 mLs by mouth 2 (two) times daily.   07/09/2016 at 0900  . senna-docusate (SENOKOT-S) 8.6-50 MG tablet Take 1 tablet by mouth 2 (two) times daily.   07/09/2016 at 0900  . sertraline (ZOLOFT) 50 MG tablet Take 150 mg by mouth at bedtime.   07/08/2016 at 2100  . simethicone (MYLICON) 80 MG chewable tablet Chew 80 mg by mouth 3 (three) times daily.   07/09/2016 at 0600donsy  . timolol (BETIMOL) 0.5 % ophthalmic solution Place 1 drop into both eyes daily.   07/09/2016 at unknown  . traZODone (DESYREL) 100 MG tablet Take 100 mg by mouth at bedtime.   07/08/2016 at 2100   Assessment: Pharmacy consulted to dose levaquin , vancomycin and aztreonam.  Pt on HD at home, unsure of HD schedule.  Nephro consult ordered.   Goal of Therapy:  Vancomycin trough level 15-20 mcg/ml  Plan:  Expected duration 7 days with resolution of temperature and/or normalization of WBC   Will start levofloxacin 500mg  po every 48 hours.   Pernell Dupre, PharmD, BCPS Clinical Pharmacist 07/16/2016 3:45 PM

## 2016-07-16 NOTE — Progress Notes (Signed)
SURGICAL PROGRESS NOTE (cpt [id number])  Hospital Day(s): 4.   Post op day(s):  William Carpenter   Interval History: Patient seen and examined, no acute events or new complaints overnight. Patient reports tolerating clear liquids diet with further improved but not yet resolved RUQ abdominal pain and +flatus, +BM, denies N/V, fever/chills, CP, or SOB.  Review of Systems:  Constitutional: denies fever, chills  HEENT: denies cough or congestion  Respiratory: denies any shortness of breath  Cardiovascular: denies chest pain or palpitations  Gastrointestinal: abdominal pain, N/V, and bowel function as per interval history Genitourinary: denies burning with urination or urinary frequency Musculoskeletal: denies pain, decreased motor or sensation Integumentary: denies any other rashes or skin discoloration Neurological: denies HA or vision/hearing changes   Vital signs in last 24 hours: [min-max] current  Temp:  [97.8 F (36.6 C)-98.2 F (36.8 C)] 98 F (36.7 C) (07/13 0744) Pulse Rate:  [52-84] 65 (07/13 0744) Resp:  [12-20] 18 (07/13 0744) BP: (141-188)/(77-103) 162/77 (07/13 0744) SpO2:  [96 %-100 %] 100 % (07/13 0744)     Height: 5\' 10"  (177.8 cm) Weight: 171 lb 15.3 oz (78 kg) BMI (Calculated): 24.2   Intake/Output this shift:  No intake/output data recorded.   Intake/Output last 2 shifts:  @IOLAST2SHIFTS @   Physical Exam:  Constitutional: alert, cooperative and no distress  HENT: normocephalic without obvious abnormality  Eyes: PERRL, EOM's grossly intact and symmetric  Neuro: CN II - XII grossly intact and symmetric without deficit  Respiratory: breathing non-labored at rest  Cardiovascular: regular rate and sinus rhythm  Gastrointestinal: soft and non-distended with mild RUQ abdominal tenderness to palpation Musculoskeletal: UE and LE FROM, no edema or wounds, motor and sensation grossly intact  Labs:  CBC Latest Ref Rng & Units 07/16/2016 07/14/2016 07/13/2016  WBC 3.8 - 10.6 K/uL  10.7(H) 15.8(H) 18.6(H)  Hemoglobin 13.0 - 18.0 g/dL 10.1(L) 11.1(L) 10.0(L)  Hematocrit 40.0 - 52.0 % 30.3(L) 34.0(L) 30.4(L)  Platelets 150 - 440 K/uL 309 393 317   CMP Latest Ref Rng & Units 07/16/2016 07/15/2016 07/14/2016  Glucose 65 - 99 mg/dL 77 81 80  BUN 6 - 20 mg/dL 21(H) 40(H) 32(H)  Creatinine 0.61 - 1.24 mg/dL 4.08(H) 6.14(H) 5.34(H)  Sodium 135 - 145 mmol/L 132(L) 136 138  Potassium 3.5 - 5.1 mmol/L 3.7 4.3 4.3  Chloride 101 - 111 mmol/L 94(L) 98(L) 97(L)  CO2 22 - 32 mmol/L 29 27 29   Calcium 8.9 - 10.3 mg/dL 9.7 10.0 10.5(H)  Total Protein 6.5 - 8.1 g/dL 7.6 7.5 8.5(H)  Total Bilirubin 0.3 - 1.2 mg/dL 1.7(H) 1.9(H) 1.7(H)  Alkaline Phos 38 - 126 U/L 342(H) 326(H) 269(H)  AST 15 - 41 U/L 27 29 30   ALT 17 - 63 U/L 31 32 36   Imaging studies: No new pertinent imaging studies   Assessment/Plan: (ICD-10's: K81.9) 61 y.o.malewith resolved leukocytosis and improving RUQ abdominal pain (only when palpated) on antibiotics, and hyperbilirubinemia without evidence of cholecystitis (cystic duct obstruction) on HIDA with mildly decreased gallbladder ejection fraction, complicated bycomorbidities including multiple prior open abdominal surgeries including open splenectomy and nephrectomy as well as autoimmune bullous pemphigus, 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              - IV antibiotics - clear liquids diet for now             - anticipate low fat diet upon resolution of RUQ pain - risks of likely  open cholecystectomy for this patient not indicated for mild biliary dyskinesia without cholecystitis per HIDA imaging - medical management of comorbidities per primary medical team - DVT prophylaxis  All of the above findings and recommendations were discussed with the patient and his family, and all of patient's and his family's questions were  answered to their expressed satisfaction.  Thank you for the opportunity to participate in this patient's care.  -- Marilynne Drivers Rosana Hoes, MD, [place]: [facility] - Partnering for exceptional care. Office: [phone]

## 2016-07-16 NOTE — Progress Notes (Signed)
[facility], [place] 07/16/16  Subjective:   Overall feels fair.  His daughter is in the room with him. No nausea or vomiting reported. Today, his main concern is left arm edema.  There is swelling around the elbow  It is somewhat painful. Ultrasound of the arm suggesting non-occlusive thrombus.   Objective:  Vital signs in last 24 hours:  Temp:  [98 F (36.7 C)-98.2 F (36.8 C)] 98 F (36.7 C) (07/13 0744) Pulse Rate:  [52-79] 65 (07/13 0744) Resp:  [12-20] 18 (07/13 0744) BP: (147-188)/(77-103) 162/77 (07/13 0744) SpO2:  [96 %-100 %] 100 % (07/13 0744)  Weight change:  Filed Weights   07/12/16 1736 07/13/16 1400 07/13/16 1815  Weight: 76.2 kg (168 lb) 77.5 kg (170 lb 13.7 oz) 78 kg (171 lb 15.3 oz)    Intake/Output:    Intake/Output Summary (Last 24 hours) at 07/16/16 1400 Last data filed at 07/16/16 0800  Gross per 24 hour  Intake              390 ml  Output                0 ml  Net              390 ml     Physical Exam: General: No acute distress,   HEENT Anicteric, moist oral mucous membranes  Neck supple  Pulm/lungs Normal effort, clear to auscultation  CVS/Heart No rub or gallop  Abdomen:  Soft, slight right upper quadrant tenderness  Extremities: No edema  Neurologic: Alert, oriented  skin Lesions over hands and forearms  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, edema around elbow and proximal forearm       Basic Metabolic Panel:   Recent Labs Lab 07/12/16 1740 07/13/16 0400 07/14/16 1018 07/15/16 0340 07/16/16 0411  NA 136 137 138 136 132*  K 3.8 4.3 4.3 4.3 3.7  CL 96* 99* 97* 98* 94*  CO2 27 28 29 27 29   GLUCOSE 123* 102* 80 81 77  BUN 54* 61* 32* 40* 21*  CREATININE 7.16* 7.57* 5.34* 6.14* 4.08*  CALCIUM 9.5 9.6 10.5* 10.0 9.7     CBC:  Recent Labs Lab 07/12/16 1740 07/13/16 0400 07/14/16 1018 07/16/16 0411  WBC 23.4* 18.6* 15.8* 10.7*  NEUTROABS 18.4*  --   --   --   HGB 10.4* 10.0*  11.1* 10.1*  HCT 31.3* 30.4* 34.0* 30.3*  MCV 103.6* 104.9* 102.3* 103.3*  PLT 344 317 393 309     No results found for: HEPBSAG, HEPBSAB, HEPBIGM    Microbiology:  Recent Results (from the past 240 hour(s))  Blood Culture (routine x 2)     Status: None (Preliminary result)   Collection Time: 07/12/16  6:29 PM  Result Value Ref Range Status   Specimen Description BLOOD BLOOD RIGHT FOREARM  Final   Special Requests   Final    BOTTLES DRAWN AEROBIC AND ANAEROBIC Blood Culture adequate volume   Culture NO GROWTH 4 DAYS  Final   Report Status PENDING  Incomplete  Blood Culture (routine x 2)     Status: None (Preliminary result)   Collection Time: 07/12/16  6:29 PM  Result Value Ref Range Status   Specimen Description BLOOD RIGHT ANTECUBITAL  Final   Special Requests   Final    BOTTLES DRAWN AEROBIC AND ANAEROBIC Blood Culture adequate volume   Culture NO GROWTH 4 DAYS  Final   Report Status PENDING  Incomplete  MRSA PCR Screening     Status: None   Collection Time: 07/13/16  3:32 AM  Result Value Ref Range Status   MRSA by PCR NEGATIVE NEGATIVE Final    Comment:        The GeneXpert MRSA Assay (FDA approved for NASAL specimens only), is one component of a comprehensive MRSA colonization surveillance program. It is not intended to diagnose MRSA infection nor to guide or monitor treatment for MRSA infections.     Coagulation Studies: No results for input(s): LABPROT, INR in the last 72 hours.  Urinalysis: No results for input(s): COLORURINE, LABSPEC, PHURINE, GLUCOSEU, HGBUR, BILIRUBINUR, KETONESUR, PROTEINUR, UROBILINOGEN, NITRITE, LEUKOCYTESUR in the last 72 hours.  Invalid input(s): APPERANCEUR    Imaging: Nm Hepato W/eject Fract  Result Date: 07/15/2016 CLINICAL DATA:  Abdominal pain for 2-3 weeks, question acute acalculous cholecystitis EXAM: NUCLEAR MEDICINE HEPATOBILIARY IMAGING WITH GALLBLADDER EF TECHNIQUE: Sequential images of the abdomen were obtained  out to 60 minutes following intravenous administration of radiopharmaceutical. After oral ingestion of Ensure, gallbladder ejection fraction was determined. At 60 min, normal ejection fraction is greater than 33%. RADIOPHARMACEUTICALS:  5.094 mCi Tc-58m  Choletec IV COMPARISON:  None; correlation ultrasound abdomen 07/14/2016 FINDINGS: Normal tracer extraction from bloodstream indicating normal hepatocellular function. Normal excretion of tracer into biliary tree. Gallbladder visualized at 13 min. Small bowel not visualized until following fatty meal stimulation No hepatic retention of tracer. Subjectively decreased emptying of tracer from gallbladder following fatty meal stimulation. Calculated gallbladder ejection fraction is 21%, abnormally low. Patient reported no symptoms following Ensure ingestion. Normal gallbladder ejection fraction following Ensure ingestion is greater than 33% at 1 hour. IMPRESSION: Patent biliary tree. Abnormal gallbladder response to fatty meal stimulation with a decreased gallbladder ejection fraction of 21%. Electronically Signed   By: Lavonia Dana M.D.   On: 07/15/2016 11:26   [place] Lt Upper Extrem Ltd Soft Tissue Non Vascular  Result Date: 07/16/2016 CLINICAL DATA:  New area of swelling in the proximal forearm. EXAM: ULTRASOUND left UPPER EXTREMITY LIMITED TECHNIQUE: Ultrasound examination of the upper extremity soft tissues was performed in the area of clinical concern. COMPARISON:  Prior study 07/12/2016 FINDINGS: Stable small amount of nonocclusive clot in the cephalic vein. Left upper extremity dialysis fistula appears grossly patent. No mass or fluid collection in the area of new swelling. Persistent hematoma in the left shoulder area measures 5.5 x 2.4 x 4.4 cm. IMPRESSION: Stable patent appearing dialysis fistula. Small amount of nonocclusive clot noted in the cephalic vein. Persistent hematoma in the left shoulder area. No obvious hematoma or fluid collection in the new area  of swelling in the left proximal forearm. Electronically Signed   By: Marijo Sanes M.D.   On: 07/16/2016 12:49     Medications:   . levofloxacin (LEVAQUIN) IV Stopped (07/14/16 1807)  . vancomycin Stopped (07/15/16 1707)   . acetaminophen  975 mg Oral Q8H  . atorvastatin  20 mg Oral Daily  . calcium acetate  667 mg Oral TID WC  . carvedilol  25 mg Oral BID WC  . cinacalcet  90 mg Oral Daily  . cloNIDine  0.1 mg Oral TID  . gabapentin  100 mg Oral QHS  . heparin  5,000 Units Subcutaneous Q8H  . loratadine  10 mg Oral Daily  . methocarbamol  500 mg Oral Q12H  . metroNIDAZOLE  500 mg Oral Q8H  . multivitamin  1 tablet Oral QHS  . naproxen  500 mg Oral BID WC  .  pantoprazole  40 mg Oral Daily  . senna-docusate  1 tablet Oral BID  . sertraline  150 mg Oral QHS  . simethicone  80 mg Oral TID  . timolol  1 drop Both Eyes Daily  . traZODone  100 mg Oral QHS   bisacodyl, docusate sodium, oxyCODONE  Assessment/ Plan:  61 y.o. caucasian male with ESRD, dialysis for 28 years, during the patient, recent cervical decompression, autoimmune skin disease presents for hypotension  [facility], [facility], [facility]  1.  ESRD Routine hemodialysis tomorrow. Swelling around the access- has been evaluated by vascular surgery.  No indication for fistulogram.  Swelling is related to his arm tissue/ cellulitis  2.  Hypotension - blood pressure has improved, in fact now patient is hypertensive - will try to remove fluid with HD tomorrow  3. Secondary hyperparathyroidism continue home dose of calcium acetate when able to eat proper meals  4. Anemia of CKD - EPO with HD  5. Acalculus cholecystitis Plans as per surgery and IM team  6. Left forearm edema and pain ? celulitis     LOS: 4 [facility] 7/13/20182:00 PM  [facility] [place], [place]

## 2016-07-17 DIAGNOSIS — K819 Cholecystitis, unspecified: Principal | ICD-10-CM

## 2016-07-17 LAB — CULTURE, BLOOD (ROUTINE X 2)
CULTURE: NO GROWTH
Culture: NO GROWTH
Special Requests: ADEQUATE
Special Requests: ADEQUATE

## 2016-07-17 MED ORDER — METRONIDAZOLE 500 MG PO TABS: 500.0000 mg | ORAL_TABLET | Freq: Three times a day (TID) | ORAL | 0 refills | Status: DC

## 2016-07-17 MED ORDER — LEVOFLOXACIN 500 MG PO TABS: 500.0000 mg | ORAL_TABLET | ORAL | 0 refills | Status: DC

## 2016-07-17 NOTE — Discharge Summary (Signed)
[place] [facility] [facility] [place] NAME: William Carpenter    MR#:  [id number]  DATE OF BIRTH:  09/06/1955  DATE OF ADMISSION:  07/12/2016   ADMITTING PHYSICIAN: Vaughan Basta, MD  DATE OF DISCHARGE: 07/17/2016 PRIMARY CARE PHYSICIAN: Raelyn Mora, MD   ADMISSION DIAGNOSIS:  Cough [R05] Somnolence [R40.0] Sinus bradycardia [R00.1] Elevated bilirubin [R17] Sepsis, due to unspecified organism ([place]) [A41.9] Leukocytosis, unspecified type [D72.829] DISCHARGE DIAGNOSIS:  Principal Problem:   Leukocytosis Active Problems:   Hypotension Leukocytosis- acalculous cholecystitis SECONDARY DIAGNOSIS:   Past Medical History:  Diagnosis Date  . Autoimmune disease ([place])    skin problem  . Dysphagia   . FSGS (focal segmental glomerulosclerosis)   . Hyperlipidemia   . Muscle weakness (generalized)   . Renal disorder    end stage   . Spinal stenosis, cervical region    HOSPITAL COURSE:  * Leukocytosis- acalculous cholecystitis  on broad-spectrum antibiotic, blood cultures are sent. Patient cannot give urine sample as he does not produce urine. Chest x-ray is negative. Negative blood culture.  HIDA scan done/ no need for surgery for now.  may be a component of cellulitis with his skin lesions, but now symptomatically much better. Per Dr. Ola Spurr, Can dc vanco as bcx negative, MRSA PCR negative Continue  levo and flagyl for presumed improving cholecystitis and wound infections Would rec a 10 day total course - can be oral   * ESRD on HD  HD per Dr. Candiss Norse.  * Hypotension  improved, resumed HTN  meds.  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  * Elevated bilirubin  right upper quadrant ultrasound study shows acalculous cholecystitis, stable.  * Fore arm swelling   US doppler: no DVT. MRI: fluid collection. Per Dr. Rudene Christians, he has markedly edema with some erythema but no drainage and not really a sensation  of fluctuance. His MRI did show some fluid collection present. it's better to wait and see if this will resolve with his current antibiotic regimen. close observation and will see him back in 2 days. I discussed with Dr. Rudene Christians. DISCHARGE CONDITIONS:  Stable, discharge to home with home health and PT today. CONSULTS OBTAINED:  Treatment Team:  Leonel Ramsay, MD Murlean Iba, MD Emmaline Kluver., MD Vickie Epley, MD Schnier, Dolores Lory, MD Hessie Knows, MD DRUG ALLERGIES:   Allergies  Allergen Reactions  . Albumin (Human) Hives  . Aloe   . Iodinated Diagnostic Agents   . Iron Dextran   . Metoprolol Tartrate   . Penicillins Swelling  . Quinine Sulfate [Quinine]   . Rabeprazole   . Sulfa Antibiotics Other (See Comments)  . Trimethoprim    DISCHARGE MEDICATIONS:   Allergies as of 07/17/2016      Reactions   Albumin (human) Hives   Aloe    Iodinated Diagnostic Agents    Iron Dextran    Metoprolol Tartrate    Penicillins Swelling   Quinine Sulfate [quinine]    Rabeprazole    Sulfa Antibiotics Other (See Comments)   Trimethoprim       Medication List    TAKE these medications   acetaminophen 325 MG tablet Commonly known as:  TYLENOL Take 975 mg by mouth every 8 (eight) hours.   atorvastatin 20 MG tablet Commonly known as:  LIPITOR Take 20 mg by mouth daily.   b complex-vitamin c-folic acid 0.8 MG Tabs tablet Take 1 tablet by mouth at bedtime.  bisacodyl 10 MG suppository Commonly known as:  DULCOLAX Place 10 mg rectally daily as needed for moderate constipation.   calcium acetate 667 MG capsule Commonly known as:  PHOSLO Take 667 mg by mouth 3 (three) times daily with meals.   carvedilol 25 MG tablet Commonly known as:  COREG Take 25 mg by mouth 2 (two) times daily with a meal.   cetirizine 10 MG tablet Commonly known as:  ZYRTEC Take 10 mg by mouth daily.   cinacalcet 90 MG tablet Commonly known as:  SENSIPAR Take 90 mg by mouth  daily.   cloNIDine 0.1 MG tablet Commonly known as:  CATAPRES Take 0.1 mg by mouth every 4 (four) hours. Take 1 tablet by mouth every 4 hours as needed for SBP over 180   gabapentin 100 MG capsule Commonly known as:  NEURONTIN Take 100 mg by mouth at bedtime.   hydrALAZINE 10 MG tablet Commonly known as:  APRESOLINE Take 10 mg by mouth 2 (two) times daily.   KONSYL-D PO Take 5 mLs by mouth 2 (two) times daily.   levofloxacin 500 MG tablet Commonly known as:  LEVAQUIN Take 1 tablet (500 mg total) by mouth every other day.   methocarbamol 500 MG tablet Commonly known as:  ROBAXIN Take 500 mg by mouth every 12 (twelve) hours.   metroNIDAZOLE 500 MG tablet Commonly known as:  FLAGYL Take 1 tablet (500 mg total) by mouth every 8 (eight) hours.   naproxen 500 MG tablet Commonly known as:  NAPROSYN Take 500 mg by mouth 2 (two) times daily with a meal.   omeprazole 20 MG capsule Commonly known as:  PRILOSEC Take 20 mg by mouth 2 (two) times daily before a meal.   oxycodone 5 MG capsule Commonly known as:  OXY-IR Take 5-10 mg by mouth every 4 (four) hours as needed.   polyethylene glycol packet Commonly known as:  MIRALAX / GLYCOLAX Take 17 g by mouth daily.   REFRESH OPTIVE SENSITIVE OP Place 1 drop into both eyes every 4 (four) hours.   senna-docusate 8.6-50 MG tablet Commonly known as:  Senokot-S Take 1 tablet by mouth 2 (two) times daily.   sertraline 50 MG tablet Commonly known as:  ZOLOFT Take 150 mg by mouth at bedtime.   simethicone 80 MG chewable tablet Commonly known as:  MYLICON Chew 80 mg by mouth 3 (three) times daily.   timolol 0.5 % ophthalmic solution Commonly known as:  BETIMOL Place 1 drop into both eyes daily.   traZODone 100 MG tablet Commonly known as:  DESYREL Take 100 mg by mouth at bedtime.        DISCHARGE INSTRUCTIONS:  See AVS  If you experience worsening of your admission symptoms, develop shortness of breath, life  threatening emergency, suicidal or homicidal thoughts you must seek medical attention immediately by calling 911 or calling your MD immediately  if symptoms less severe.  You Must read complete instructions/literature along with all the possible adverse reactions/side effects for all the Medicines you take and that have been prescribed to you. Take any new Medicines after you have completely understood and accpet all the possible adverse reactions/side effects.   Please note  You were cared for by a hospitalist during your hospital stay. If you have any questions about your discharge medications or the care you received while you were in the hospital after you are discharged, you can call the unit and asked to speak with the hospitalist on call if the  hospitalist that took care of you is not available. Once you are discharged, your primary care physician will handle any further medical issues. Please note that NO REFILLS for any discharge medications will be authorized once you are discharged, as it is imperative that you return to your primary care physician (or establish a relationship with a primary care physician if you do not have one) for your aftercare needs so that they can reassess your need for medications and monitor your lab values.    On the day of Discharge:  VITAL SIGNS:  Blood pressure (!) 143/82, pulse (!) 52, temperature 97.6 F (36.4 C), temperature source Oral, resp. rate 13, height 5\' 10"  (1.778 m), weight 179 lb 14.3 oz (81.6 kg), SpO2 98 %. PHYSICAL EXAMINATION:  GENERAL:  61 y.o.-year-old patient lying in the bed with no acute distress.  EYES: Pupils equal, round, reactive to light and accommodation. No scleral icterus. Extraocular muscles intact.  HEENT: Head atraumatic, normocephalic. Oropharynx and nasopharynx clear.  NECK:  Supple, no jugular venous distention. No thyroid enlargement, no tenderness.  LUNGS: Normal breath sounds bilaterally, no wheezing, rales,rhonchi or  crepitation. No use of accessory muscles of respiration.  CARDIOVASCULAR: S1, S2 normal. No murmurs, rubs, or gallops.  ABDOMEN: Soft, non-tender, non-distended. Bowel sounds present. No organomegaly or mass.  EXTREMITIES: No pedal edema, cyanosis, or clubbing. Left arm edema and AV fistula. NEUROLOGIC: Cranial nerves II through XII are intact. Muscle strength 4/5 in all extremities. Sensation intact. Gait not checked.  PSYCHIATRIC: The patient is alert and oriented x 3.  SKIN: No obvious rash, lesion, or ulcer.  DATA REVIEW:   CBC  Recent Labs Lab 07/16/16 0411  WBC 10.7*  HGB 10.1*  HCT 30.3*  PLT 309    Chemistries   Recent Labs Lab 07/16/16 0411  NA 132*  K 3.7  CL 94*  CO2 29  GLUCOSE 77  BUN 21*  CREATININE 4.08*  CALCIUM 9.7  AST 27  ALT 31  ALKPHOS 342*  BILITOT 1.7*     Microbiology Results  Results for orders placed or performed during the hospital encounter of 07/12/16  Blood Culture (routine x 2)     Status: None   Collection Time: 07/12/16  6:29 PM  Result Value Ref Range Status   Specimen Description BLOOD BLOOD RIGHT FOREARM  Final   Special Requests   Final    BOTTLES DRAWN AEROBIC AND ANAEROBIC Blood Culture adequate volume   Culture NO GROWTH 5 DAYS  Final   Report Status 07/17/2016 FINAL  Final  Blood Culture (routine x 2)     Status: None   Collection Time: 07/12/16  6:29 PM  Result Value Ref Range Status   Specimen Description BLOOD RIGHT ANTECUBITAL  Final   Special Requests   Final    BOTTLES DRAWN AEROBIC AND ANAEROBIC Blood Culture adequate volume   Culture NO GROWTH 5 DAYS  Final   Report Status 07/17/2016 FINAL  Final  MRSA PCR Screening     Status: None   Collection Time: 07/13/16  3:32 AM  Result Value Ref Range Status   MRSA by PCR NEGATIVE NEGATIVE Final    Comment:        The GeneXpert MRSA Assay (FDA approved for NASAL specimens only), is one component of a comprehensive MRSA colonization surveillance program. It is  not intended to diagnose MRSA infection nor to guide or monitor treatment for MRSA infections.   Aerobic Culture (superficial specimen)     Status:  None (Preliminary result)   Collection Time: 07/16/16  3:00 PM  Result Value Ref Range Status   Specimen Description ARM  Final   Special Requests Normal  Final   Gram Stain   Final    RARE WBC PRESENT, PREDOMINANTLY MONONUCLEAR RARE GRAM POSITIVE COCCI IN PAIRS Performed at [facility], [facility] [address] [place], [place] [address]    Culture PENDING  Incomplete   Report Status PENDING  Incomplete    RADIOLOGY:  Mr Elbow Left Wo Contrast  Result Date: 07/16/2016 CLINICAL DATA:  Renal failure patient with left elbow swelling. Elevated white blood cell count. EXAM: MRI OF THE LEFT ELBOW WITHOUT CONTRAST TECHNIQUE: Multiplanar, multisequence MR imaging of the elbow was performed. No intravenous contrast was administered. COMPARISON:  None. FINDINGS: Extensive subcutaneous edema is present about the elbow and imaged forearm. Edema is worst along the posterior aspect of the elbow and proximal forearm. Focal appearing fluid in the subcutaneous tissues posterior to the olecranon measures approximately 2.4 cm transverse by 1.3 cm AP. Intense, confluent edema is seen in the dorsal soft tissues of the imaged forearm. No intramuscular fluid collection is identified. Dialysis fistula is noted. TENDONS Common forearm flexor origin: Intact. Common forearm extensor origin: Intact. Biceps: Intact. Triceps: Intact. LIGAMENTS Medial stabilizers: Intact. Lateral stabilizers:  Intact. Cartilage: Unremarkable. Joint: No joint effusion. Cubital tunnel: Normal. Bones: Normal marrow signal throughout. IMPRESSION: Intense subcutaneous edema about the elbow with confluent fluid posterior to the elbow and upper forearm worrisome for cellulitis and possible abscess formation. Most focal appearing pocket of fluid is seen posterior and slightly lateral to the olecranon  process. This should be amenable to needle aspiration. Negative for septic joint or osteomyelitis. Electronically Signed   By: Inge Rise M.D.   On: 07/16/2016 19:44     Management plans discussed with the patient, daughter and they are in agreement.  CODE STATUS: Full Code   TOTAL TIME TAKING CARE OF THIS PATIENT:42  minutes.    Demetrios Loll M.D on 07/17/2016 at 12:02 PM  Between 7am to 6pm - Pager - [phone]  After 6pm go to [email or web address] - Proofreader  [facility]  Office  [phone]  CC: Primary care physician; Raelyn Mora, MD   Note: This dictation was prepared with Dragon dictation along with smaller phrase technology. Any transcriptional errors that result from this process are unintentional.

## 2016-07-17 NOTE — Progress Notes (Signed)
Pre-hemodialysis 

## 2016-07-17 NOTE — Progress Notes (Signed)
Pre-hemodialsyis

## 2016-07-17 NOTE — Discharge Instructions (Signed)
Renal diet. HHPT

## 2016-07-17 NOTE — Progress Notes (Signed)
SURGICAL PROGRESS NOTE (cpt [phone])  Hospital Day(s): 5.   Post op day(s):  Marland Kitchen   Interval History: Patient seen and examined, no acute events or new complaints overnight. Patient reports resolution of his RUQ abdominal pain with +flatus and +BM, requests to be able to eat solid foods, denies N/V, fever/chills, CP, or SOB.  Review of Systems:  Constitutional: denies fever, chills  HEENT: denies cough or congestion  Respiratory: denies any shortness of breath  Cardiovascular: denies chest pain or palpitations  Gastrointestinal: abdominal pain, N/V, and bowel function as per interval history Genitourinary: denies burning with urination or urinary frequency Musculoskeletal: denies pain, decreased motor or sensation Integumentary: denies any other rashes or skin discolorations Neurological: denies HA or vision/hearing changes   Vital signs in last 24 hours: [min-max] current  Temp:  [97.6 F (36.4 C)-98.7 F (37.1 C)] 97.7 F (36.5 C) (07/14 1312) Pulse Rate:  [52-82] 64 (07/14 1312) Resp:  [10-20] 15 (07/14 1312) BP: (98-186)/(68-107) 177/105 (07/14 1411) SpO2:  [96 %-99 %] 99 % (07/14 1312) Weight:  [179 lb 14.3 oz (81.6 kg)-179 lb 14.4 oz (81.6 kg)] 179 lb 14.3 oz (81.6 kg) (07/14 0930)     Height: 5\' 10"  (177.8 cm) Weight: 179 lb 14.3 oz (81.6 kg) BMI (Calculated): 24.2   Intake/Output this shift:  Total I/O In: -  Out: 1500 [Other:1500]   Intake/Output last 2 shifts:  @IOLAST2SHIFTS @   Physical Exam:  Constitutional: alert, cooperative and no distress  HENT: normocephalic without obvious abnormality  Eyes: PERRL, EOM's grossly intact and symmetric  Neuro: CN II - XII grossly intact and symmetric without deficit  Respiratory: breathing non-labored at rest  Cardiovascular: regular rate and sinus rhythm  Gastrointestinal: soft, non-tender, and non-distended  Musculoskeletal: UE and LE FROM, LUE forearm edema associated with non-pulsatile radial-cephalic AV fistula for HD  with easily palpable thrill, motor and sensation grossly intact, NT   Labs:  CBC Latest Ref Rng & Units 07/16/2016 07/14/2016 07/13/2016  WBC 3.8 - 10.6 K/uL 10.7(H) 15.8(H) 18.6(H)  Hemoglobin 13.0 - 18.0 g/dL 10.1(L) 11.1(L) 10.0(L)  Hematocrit 40.0 - 52.0 % 30.3(L) 34.0(L) 30.4(L)  Platelets 150 - 440 K/uL 309 393 317   CMP Latest Ref Rng & Units 07/16/2016 07/15/2016 07/14/2016  Glucose 65 - 99 mg/dL 77 81 80  BUN 6 - 20 mg/dL 21(H) 40(H) 32(H)  Creatinine 0.61 - 1.24 mg/dL 4.08(H) 6.14(H) 5.34(H)  Sodium 135 - 145 mmol/L 132(L) 136 138  Potassium 3.5 - 5.1 mmol/L 3.7 4.3 4.3  Chloride 101 - 111 mmol/L 94(L) 98(L) 97(L)  CO2 22 - 32 mmol/L 29 27 29   Calcium 8.9 - 10.3 mg/dL 9.7 10.0 10.5(H)  Total Protein 6.5 - 8.1 g/dL 7.6 7.5 8.5(H)  Total Bilirubin 0.3 - 1.2 mg/dL 1.7(H) 1.9(H) 1.7(H)  Alkaline Phos 38 - 126 U/L 342(H) 326(H) 269(H)  AST 15 - 41 U/L 27 29 30   ALT 17 - 63 U/L 31 32 36    Imaging studies: No new pertinent imaging studies  Assessment/Plan: (ICD-10's: K81.9) 61 y.o.malewith resolvedleukocytosis andimprovingRUQ abdominal pain (only when palpated) on antibiotics, and hyperbilirubinemia without evidence of cholecystitis (cystic duct obstruction) on HIDA with mildly decreased gallbladder ejection fraction, complicatedbycomorbidities including multiple prior open abdominal surgeries including open splenectomy and nephrectomy as well as autoimmune bullous pemphigus, 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  - IV antibiotics - advance to low fat diet - risks of likely open cholecystectomy for this  patient not indicated for mild biliary dyskinesia without cholecystitis per HIDA imaging - medical management of comorbidities per primary medical team - DVT prophylaxis  All of the above findings and recommendations were discussed  with the patient and his family, and all of patient's and his family's questions were answered to their expressed satisfaction.  Thank you for the opportunity to participate in this patient's care.  -- Marilynne Drivers Rosana Hoes, MD, [place]: [facility] - Partnering for exceptional care. Office: [phone]

## 2016-07-17 NOTE — Care Management Note (Signed)
Case Management Note  Patient Details  Name: William Carpenter MRN: [id number] Date of Birth: 1955/05/21  Subjective/Objective:     Discussed discharge planning with [name] . Current plan is for him to be discharged home today after dialysis with HH-PT, RN services. [name] is already open to [facility]. A referral for home health RN and PT was called to [place] [facility] [place].              Action/Plan:   Expected Discharge Date:    07/17/16              Expected Discharge Plan:   07/18/15  In-House Referral:     Discharge planning Services     Post Acute Care Choice:   Yes Choice offered to:   [name]  DME Arranged:   NA DME Agency:     HH Arranged:   PT, RN [place] Agency:   [facility]  Status of Service:   Completed.  If discussed at [place] of Stay Meetings, dates discussed:    Additional Comments:  Johnwesley Lederman A, RN 07/17/2016, 9:44 AM

## 2016-07-17 NOTE — Progress Notes (Signed)
[facility] [place], [place] 07/17/16  Subjective:     HEMODIALYSIS FLOWSHEET:  Blood Flow Rate (mL/min): 400 mL/min Arterial Pressure (mmHg): -190 mmHg Venous Pressure (mmHg): 150 mmHg Transmembrane Pressure (mmHg): 60 mmHg Ultrafiltration Rate (mL/min): 590 mL/min Dialysate Flow Rate (mL/min): 800 ml/min Conductivity: Machine : 13.8 Conductivity: Machine : 13.8 Dialysis Fluid Bolus: Normal Saline Bolus Amount (mL): 250 mL Dialysate Change:  (3k 2.5ca)  Patient seen during dialysis Tolerating well    Objective:  Vital signs in last 24 hours:  Temp:  [97.6 F (36.4 C)-98.7 F (37.1 C)] 97.6 F (36.4 C) (07/14 0930) Pulse Rate:  [52-64] 63 (07/14 1230) Resp:  [10-20] 15 (07/14 1230) BP: (98-186)/(68-107) 178/95 (07/14 1230) SpO2:  [96 %-98 %] 98 % (07/14 1230) Weight:  [81.6 kg (179 lb 14.3 oz)-81.6 kg (179 lb 14.4 oz)] 81.6 kg (179 lb 14.3 oz) (07/14 0930)  Weight change:  Filed Weights   07/13/16 1815 07/17/16 0400 07/17/16 0930  Weight: 78 kg (171 lb 15.3 oz) 81.6 kg (179 lb 14.4 oz) 81.6 kg (179 lb 14.3 oz)    Intake/Output:    Intake/Output Summary (Last 24 hours) at 07/17/16 1244 Last data filed at 07/17/16 0400  Gross per 24 hour  Intake              450 ml  Output                0 ml  Net              450 ml     Physical Exam: General: No acute distress,   HEENT Anicteric, moist oral mucous membranes  Neck supple  Pulm/lungs Normal effort, clear to auscultation  CVS/Heart No rub or gallop  Abdomen:  Soft, slight right upper quadrant tenderness  Extremities:  left elbow edema, cellulitis  Neurologic: Alert, oriented  skin Lesions over hands and forearms  Access:  Left arm AVF       Basic Metabolic Panel:   Recent Labs Lab 07/12/16 1740 07/13/16 0400 07/14/16 1018 07/15/16 0340 07/16/16 0411  NA 136 137 138 136 132*  K 3.8 4.3 4.3 4.3 3.7  CL 96* 99* 97* 98* 94*  CO2 27 28 29 27 29   GLUCOSE 123* 102* 80 81 77   BUN 54* 61* 32* 40* 21*  CREATININE 7.16* 7.57* 5.34* 6.14* 4.08*  CALCIUM 9.5 9.6 10.5* 10.0 9.7     CBC:  Recent Labs Lab 07/12/16 1740 07/13/16 0400 07/14/16 1018 07/16/16 0411  WBC 23.4* 18.6* 15.8* 10.7*  NEUTROABS 18.4*  --   --   --   HGB 10.4* 10.0* 11.1* 10.1*  HCT 31.3* 30.4* 34.0* 30.3*  MCV 103.6* 104.9* 102.3* 103.3*  PLT 344 317 393 309     No results found for: HEPBSAG, HEPBSAB, HEPBIGM    Microbiology:  Recent Results (from the past 240 hour(s))  Blood Culture (routine x 2)     Status: None   Collection Time: 07/12/16  6:29 PM  Result Value Ref Range Status   Specimen Description BLOOD BLOOD RIGHT FOREARM  Final   Special Requests   Final    BOTTLES DRAWN AEROBIC AND ANAEROBIC Blood Culture adequate volume   Culture NO GROWTH 5 DAYS  Final   Report Status 07/17/2016 FINAL  Final  Blood Culture (routine x 2)     Status: None   Collection Time: 07/12/16  6:29 PM  Result Value Ref Range Status   Specimen Description BLOOD RIGHT  ANTECUBITAL  Final   Special Requests   Final    BOTTLES DRAWN AEROBIC AND ANAEROBIC Blood Culture adequate volume   Culture NO GROWTH 5 DAYS  Final   Report Status 07/17/2016 FINAL  Final  MRSA PCR Screening     Status: None   Collection Time: 07/13/16  3:32 AM  Result Value Ref Range Status   MRSA by PCR NEGATIVE NEGATIVE Final    Comment:        The GeneXpert MRSA Assay (FDA approved for NASAL specimens only), is one component of a comprehensive MRSA colonization surveillance program. It is not intended to diagnose MRSA infection nor to guide or monitor treatment for MRSA infections.   Aerobic Culture (superficial specimen)     Status: None (Preliminary result)   Collection Time: 07/16/16  3:00 PM  Result Value Ref Range Status   Specimen Description ARM  Final   Special Requests Normal  Final   Gram Stain   Final    RARE WBC PRESENT, PREDOMINANTLY MONONUCLEAR RARE GRAM POSITIVE COCCI IN PAIRS    Culture    Final    CULTURE REINCUBATED FOR BETTER GROWTH Performed at [facility], [place] [address] [place], [place] [address]    Report Status PENDING  Incomplete    Coagulation Studies: No results for input(s): LABPROT, INR in the last 72 hours.  Urinalysis: No results for input(s): COLORURINE, LABSPEC, PHURINE, GLUCOSEU, HGBUR, BILIRUBINUR, KETONESUR, PROTEINUR, UROBILINOGEN, NITRITE, LEUKOCYTESUR in the last 72 hours.  Invalid input(s): APPERANCEUR    Imaging: Mr Elbow Left Wo Contrast  Result Date: 07/16/2016 CLINICAL DATA:  Renal failure patient with left elbow swelling. Elevated white blood cell count. EXAM: MRI OF THE LEFT ELBOW WITHOUT CONTRAST TECHNIQUE: Multiplanar, multisequence MR imaging of the elbow was performed. No intravenous contrast was administered. COMPARISON:  None. FINDINGS: Extensive subcutaneous edema is present about the elbow and imaged forearm. Edema is worst along the posterior aspect of the elbow and proximal forearm. Focal appearing fluid in the subcutaneous tissues posterior to the olecranon measures approximately 2.4 cm transverse by 1.3 cm AP. Intense, confluent edema is seen in the dorsal soft tissues of the imaged forearm. No intramuscular fluid collection is identified. Dialysis fistula is noted. TENDONS Common forearm flexor origin: Intact. Common forearm extensor origin: Intact. Biceps: Intact. Triceps: Intact. LIGAMENTS Medial stabilizers: Intact. Lateral stabilizers:  Intact. Cartilage: Unremarkable. Joint: No joint effusion. Cubital tunnel: Normal. Bones: Normal marrow signal throughout. IMPRESSION: Intense subcutaneous edema about the elbow with confluent fluid posterior to the elbow and upper forearm worrisome for cellulitis and possible abscess formation. Most focal appearing pocket of fluid is seen posterior and slightly lateral to the olecranon process. This should be amenable to needle aspiration. Negative for septic joint or osteomyelitis.  Electronically Signed   By: Inge Rise M.D.   On: 07/16/2016 19:44   [place] Soft Tissue Non Vascular  Result Date: 07/16/2016 CLINICAL DATA:  New area of swelling in the proximal forearm. EXAM: ULTRASOUND left UPPER EXTREMITY LIMITED TECHNIQUE: Ultrasound examination of the upper extremity soft tissues was performed in the area of clinical concern. COMPARISON:  Prior study 07/12/2016 FINDINGS: Stable small amount of nonocclusive clot in the cephalic vein. Left upper extremity dialysis fistula appears grossly patent. No mass or fluid collection in the area of new swelling. Persistent hematoma in the left shoulder area measures 5.5 x 2.4 x 4.4 cm. IMPRESSION: Stable patent appearing dialysis fistula. Small amount of nonocclusive clot noted in  the cephalic vein. Persistent hematoma in the left shoulder area. No obvious hematoma or fluid collection in the new area of swelling in the left proximal forearm. Electronically Signed   By: Marijo Sanes M.D.   On: 07/16/2016 12:49     Medications:    . acetaminophen  975 mg Oral Q8H  . atorvastatin  20 mg Oral Daily  . calcium acetate  667 mg Oral TID WC  . carvedilol  25 mg Oral BID WC  . cinacalcet  90 mg Oral Daily  . cloNIDine  0.1 mg Oral TID  . gabapentin  100 mg Oral QHS  . heparin  5,000 Units Subcutaneous Q8H  . levofloxacin  500 mg Oral Q48H  . loratadine  10 mg Oral Daily  . methocarbamol  500 mg Oral Q12H  . metroNIDAZOLE  500 mg Oral Q8H  . multivitamin  1 tablet Oral QHS  . naproxen  500 mg Oral BID WC  . pantoprazole  40 mg Oral Daily  . senna-docusate  1 tablet Oral BID  . sertraline  150 mg Oral QHS  . simethicone  80 mg Oral TID  . timolol  1 drop Both Eyes Daily  . traZODone  100 mg Oral QHS   bisacodyl, docusate sodium, oxyCODONE  Assessment/ Plan:  61 y.o. caucasian male with ESRD, dialysis for 28 years, during the patient, recent cervical decompression, autoimmune skin disease presents for  hypotension  [facility], [facility], [facility]  1.  ESRD Patient seen during dialysis Tolerating well  2.  Hypotension - blood pressure is high now - Fluid removal as tolerated  3. Secondary hyperparathyroidism Sensipar, calcium acetate  4. Anemia of CKD - EPO with HD  5. Acalculus cholecystitis Plans as per surgery and IM team  6. Left forearm edema and pain MRI of arm shows subcutaneous edema, c/w cellulitis, possible abscess.  Patient was evaluated by Dr Rudene Christians who suggested Abx and outpatient follow up     LOS: 5 [facility] 7/14/201812:44 PM  [place], [place]

## 2016-07-17 NOTE — Progress Notes (Signed)
Post hemodialysis 

## 2016-07-17 NOTE — Progress Notes (Signed)
Post hemodialysis treatment

## 2016-07-17 NOTE — Progress Notes (Signed)
Hemodialysis started

## 2016-07-17 NOTE — Consult Note (Signed)
Patient is seen for left elbow swelling possible abscess. He is a dialysis patient with a fistula on the volar aspect of the left forearm and the swelling is to the posterior elbow and proximal forearm. He has markedly edema with some erythema but no drainage and not really a sensation of fluctuance. His MRI did show some fluid collection present. My recommendation is close observation and I'll see him back in 2 days if he is discharged so that the if it looks worse we can stick a needle in it for possible abscess. I concern is that we could see the hematoma with the needle at this time. With his fistula being on this arm there is significant potential problems and possibly losing his fistula from the needed to do surgery. Based on this I think it's better to wait and see if this will resolve with his current antibiotic regimen.

## 2016-07-17 NOTE — Progress Notes (Signed)
Pt transferred to dialysis, PO meds held. Pain medication and coreg, clonidine given prior to transfer due to elevatred BP. Notified Dialysis nurse of meds given. Pt plans to D/C home after dialysis, will follow up with Dr. Rudene Christians as outpatient.

## 2016-07-17 NOTE — Progress Notes (Signed)
Patient refused heparin and states he will do after dialysis

## 2016-07-17 NOTE — Progress Notes (Signed)
Hemodialysis completed. 

## 2016-07-19 ENCOUNTER — Telehealth: Payer: Self-pay | Admitting: Infectious Diseases

## 2016-07-19 DIAGNOSIS — L03114 Cellulitis of left upper limb: Secondary | ICD-10-CM | POA: Diagnosis not present

## 2016-07-19 LAB — AEROBIC CULTURE W GRAM STAIN (SUPERFICIAL SPECIMEN): Special Requests: NORMAL

## 2016-07-19 NOTE — Telephone Encounter (Signed)
Sent in doxy for MRSA + wound cx

## 2016-07-20 ENCOUNTER — Telehealth: Payer: Self-pay

## 2016-07-20 NOTE — Telephone Encounter (Signed)
Hospital Follow-up call made to patient at this time. Spoke with William Carpenter. Hospital Follow-up interview questions below.  1. How are you feeling? Feeling well  2. Is your pain controlled? Yes  3. What are you doing for the pain? Taking pain pills  4. Are you having any Nausea or Vomiting? No  5. Are you having any Fever or Chills? No  6. Are you having any Constipation or Diarrhea? Some contipation  7. Do you have any questions or concerns at this time? No   Discussion:

## 2016-07-21 DIAGNOSIS — Z4789 Encounter for other orthopedic aftercare: Secondary | ICD-10-CM | POA: Diagnosis not present

## 2016-07-21 DIAGNOSIS — I1311 Hypertensive heart and chronic kidney disease without heart failure, with stage 5 chronic kidney disease, or end stage renal disease: Secondary | ICD-10-CM | POA: Diagnosis not present

## 2016-07-21 DIAGNOSIS — F329 Major depressive disorder, single episode, unspecified: Secondary | ICD-10-CM | POA: Diagnosis not present

## 2016-07-21 DIAGNOSIS — L120 Bullous pemphigoid: Secondary | ICD-10-CM | POA: Diagnosis not present

## 2016-07-21 DIAGNOSIS — G629 Polyneuropathy, unspecified: Secondary | ICD-10-CM | POA: Diagnosis not present

## 2016-07-21 DIAGNOSIS — N186 End stage renal disease: Secondary | ICD-10-CM | POA: Diagnosis not present

## 2016-07-23 DIAGNOSIS — F329 Major depressive disorder, single episode, unspecified: Secondary | ICD-10-CM | POA: Diagnosis not present

## 2016-07-23 DIAGNOSIS — L120 Bullous pemphigoid: Secondary | ICD-10-CM | POA: Diagnosis not present

## 2016-07-23 DIAGNOSIS — N186 End stage renal disease: Secondary | ICD-10-CM | POA: Diagnosis not present

## 2016-07-23 DIAGNOSIS — I1311 Hypertensive heart and chronic kidney disease without heart failure, with stage 5 chronic kidney disease, or end stage renal disease: Secondary | ICD-10-CM | POA: Diagnosis not present

## 2016-07-23 DIAGNOSIS — G629 Polyneuropathy, unspecified: Secondary | ICD-10-CM | POA: Diagnosis not present

## 2016-07-23 DIAGNOSIS — Z4789 Encounter for other orthopedic aftercare: Secondary | ICD-10-CM | POA: Diagnosis not present

## 2016-07-26 ENCOUNTER — Emergency Department: Payer: Medicare Other

## 2016-07-26 ENCOUNTER — Encounter: Payer: Self-pay | Admitting: Emergency Medicine

## 2016-07-26 ENCOUNTER — Emergency Department
Admission: EM | Admit: 2016-07-26 | Discharge: 2016-07-26 | Disposition: A | Discharge status: Other Acute Inpatient Hospital | Payer: Medicare Other | Attending: Emergency Medicine | Admitting: Emergency Medicine

## 2016-07-26 DIAGNOSIS — K819 Cholecystitis, unspecified: Secondary | ICD-10-CM | POA: Insufficient documentation

## 2016-07-26 DIAGNOSIS — K5909 Other constipation: Secondary | ICD-10-CM | POA: Diagnosis not present

## 2016-07-26 DIAGNOSIS — I959 Hypotension, unspecified: Secondary | ICD-10-CM | POA: Insufficient documentation

## 2016-07-26 DIAGNOSIS — D72829 Elevated white blood cell count, unspecified: Secondary | ICD-10-CM | POA: Diagnosis not present

## 2016-07-26 DIAGNOSIS — R1084 Generalized abdominal pain: Secondary | ICD-10-CM | POA: Insufficient documentation

## 2016-07-26 DIAGNOSIS — K5792 Diverticulitis of intestine, part unspecified, without perforation or abscess without bleeding: Secondary | ICD-10-CM

## 2016-07-26 DIAGNOSIS — J9811 Atelectasis: Secondary | ICD-10-CM | POA: Diagnosis not present

## 2016-07-26 DIAGNOSIS — M542 Cervicalgia: Secondary | ICD-10-CM | POA: Diagnosis not present

## 2016-07-26 DIAGNOSIS — Z4789 Encounter for other orthopedic aftercare: Secondary | ICD-10-CM | POA: Diagnosis not present

## 2016-07-26 DIAGNOSIS — Z79899 Other long term (current) drug therapy: Secondary | ICD-10-CM | POA: Insufficient documentation

## 2016-07-26 DIAGNOSIS — Z992 Dependence on renal dialysis: Secondary | ICD-10-CM | POA: Diagnosis not present

## 2016-07-26 DIAGNOSIS — N186 End stage renal disease: Secondary | ICD-10-CM

## 2016-07-26 DIAGNOSIS — K5641 Fecal impaction: Secondary | ICD-10-CM | POA: Diagnosis not present

## 2016-07-26 DIAGNOSIS — R911 Solitary pulmonary nodule: Secondary | ICD-10-CM | POA: Diagnosis not present

## 2016-07-26 DIAGNOSIS — F329 Major depressive disorder, single episode, unspecified: Secondary | ICD-10-CM | POA: Diagnosis not present

## 2016-07-26 DIAGNOSIS — R531 Weakness: Secondary | ICD-10-CM | POA: Diagnosis not present

## 2016-07-26 DIAGNOSIS — R17 Unspecified jaundice: Secondary | ICD-10-CM

## 2016-07-26 DIAGNOSIS — S0990XA Unspecified injury of head, initial encounter: Secondary | ICD-10-CM | POA: Diagnosis not present

## 2016-07-26 DIAGNOSIS — I1311 Hypertensive heart and chronic kidney disease without heart failure, with stage 5 chronic kidney disease, or end stage renal disease: Secondary | ICD-10-CM | POA: Diagnosis not present

## 2016-07-26 DIAGNOSIS — Z9989 Dependence on other enabling machines and devices: Secondary | ICD-10-CM | POA: Diagnosis not present

## 2016-07-26 DIAGNOSIS — R627 Adult failure to thrive: Secondary | ICD-10-CM | POA: Insufficient documentation

## 2016-07-26 DIAGNOSIS — L120 Bullous pemphigoid: Secondary | ICD-10-CM | POA: Diagnosis not present

## 2016-07-26 DIAGNOSIS — S199XXA Unspecified injury of neck, initial encounter: Secondary | ICD-10-CM | POA: Diagnosis not present

## 2016-07-26 DIAGNOSIS — M545 Low back pain: Secondary | ICD-10-CM | POA: Diagnosis not present

## 2016-07-26 DIAGNOSIS — G629 Polyneuropathy, unspecified: Secondary | ICD-10-CM | POA: Diagnosis not present

## 2016-07-26 LAB — COMPREHENSIVE METABOLIC PANEL
ALK PHOS: 245 U/L — AB (ref 38–126)
ALT: 22 U/L (ref 17–63)
AST: 33 U/L (ref 15–41)
Albumin: 2.8 g/dL — ABNORMAL LOW (ref 3.5–5.0)
Anion gap: 14 (ref 5–15)
BUN: 47 mg/dL — ABNORMAL HIGH (ref 6–20)
CO2: 27 mmol/L (ref 22–32)
Calcium: 9.5 mg/dL (ref 8.9–10.3)
Chloride: 97 mmol/L — ABNORMAL LOW (ref 101–111)
Creatinine, Ser: 6.35 mg/dL — ABNORMAL HIGH (ref 0.61–1.24)
GFR calc non Af Amer: 8 mL/min — ABNORMAL LOW (ref >60)
GFR, EST AFRICAN AMERICAN: 10 mL/min — AB (ref >60)
GLUCOSE: 70 mg/dL (ref 65–99)
Potassium: 3.8 mmol/L (ref 3.5–5.1)
SODIUM: 138 mmol/L (ref 135–145)
TOTAL PROTEIN: 7.9 g/dL (ref 6.5–8.1)
Total Bilirubin: 2.5 mg/dL — ABNORMAL HIGH (ref 0.3–1.2)

## 2016-07-26 LAB — LACTIC ACID, PLASMA: LACTIC ACID, VENOUS: 1.2 mmol/L (ref 0.5–1.9)

## 2016-07-26 LAB — CBC WITH DIFFERENTIAL/PLATELET
BASOS ABS: 0.2 10*3/uL — AB (ref 0–0.1)
BASOS PCT: 1 %
EOS ABS: 0.8 10*3/uL — AB (ref 0–0.7)
EOS PCT: 5 %
HCT: 31.6 % — ABNORMAL LOW (ref 40.0–52.0)
Hemoglobin: 10.4 g/dL — ABNORMAL LOW (ref 13.0–18.0)
Lymphocytes Relative: 23 %
Lymphs Abs: 3.4 10*3/uL (ref 1.0–3.6)
MCH: 33.8 pg (ref 26.0–34.0)
MCHC: 32.9 g/dL (ref 32.0–36.0)
MCV: 102.9 fL — ABNORMAL HIGH (ref 80.0–100.0)
Monocytes Absolute: 1.5 10*3/uL — ABNORMAL HIGH (ref 0.2–1.0)
Monocytes Relative: 10 %
NEUTROS PCT: 61 %
Neutro Abs: 8.9 10*3/uL — ABNORMAL HIGH (ref 1.4–6.5)
PLATELETS: 397 10*3/uL (ref 150–440)
RBC: 3.07 MIL/uL — ABNORMAL LOW (ref 4.40–5.90)
RDW: 15.9 % — ABNORMAL HIGH (ref 11.5–14.5)
WBC: 14.8 10*3/uL — AB (ref 3.8–10.6)

## 2016-07-26 LAB — TROPONIN I: Troponin I: <0.03 ng/mL (ref <0.03)

## 2016-07-26 LAB — BRAIN NATRIURETIC PEPTIDE: B NATRIURETIC PEPTIDE 5: 365 pg/mL — AB (ref 0.0–100.0)

## 2016-07-26 MED ORDER — BISACODYL 10 MG RE SUPP: 10.0000 mg | Freq: Every day | RECTAL | Status: DC | PRN

## 2016-07-26 MED ORDER — OXYCODONE HCL 5 MG PO TABS
ORAL_TABLET | ORAL | Status: AC
  Filled 2016-07-26: qty 1

## 2016-07-26 MED ORDER — HYDRALAZINE HCL 10 MG PO TABS: 10.0000 mg | ORAL_TABLET | ORAL | Status: DC | PRN

## 2016-07-26 MED ORDER — TIMOLOL HEMIHYDRATE 0.5 % OP SOLN: 1.0000 [drp] | Freq: Every day | OPHTHALMIC | Status: DC

## 2016-07-26 MED ORDER — SERTRALINE HCL 50 MG PO TABS
150.0000 mg | ORAL_TABLET | Freq: Every day | ORAL | Status: DC
  Administered 2016-07-26: 150 mg via ORAL
  Filled 2016-07-26: qty 3

## 2016-07-26 MED ORDER — SENNOSIDES-DOCUSATE SODIUM 8.6-50 MG PO TABS: 1.0000 | ORAL_TABLET | Freq: Two times a day (BID) | ORAL | Status: DC | PRN

## 2016-07-26 MED ORDER — MAGNESIUM CITRATE PO SOLN
1.0000 | Freq: Once | ORAL | Status: AC
Start: 2016-07-26 — End: 2016-07-26
  Administered 2016-07-26: 1 via ORAL
  Filled 2016-07-26: qty 296

## 2016-07-26 MED ORDER — CARBOXYMETHYLCELL-GLYCERIN PF 0.5-0.9 % OP SOLN: 1.0000 [drp] | OPHTHALMIC | Status: DC

## 2016-07-26 MED ORDER — POLYVINYL ALCOHOL 1.4 % OP SOLN: 1.0000 [drp] | Freq: Four times a day (QID) | OPHTHALMIC | Status: DC | PRN

## 2016-07-26 MED ORDER — DOCUSATE SODIUM 50 MG/5ML PO LIQD
100.0000 mg | ORAL | Status: AC
  Administered 2016-07-26: 100 mg
  Filled 2016-07-26 (×2): qty 10

## 2016-07-26 MED ORDER — SIMETHICONE 80 MG PO CHEW: 80.0000 mg | CHEWABLE_TABLET | Freq: Three times a day (TID) | ORAL | Status: DC

## 2016-07-26 MED ORDER — GABAPENTIN 100 MG PO CAPS: 100.0000 mg | ORAL_CAPSULE | Freq: Every day | ORAL | Status: DC

## 2016-07-26 MED ORDER — ATORVASTATIN CALCIUM 20 MG PO TABS
20.0000 mg | ORAL_TABLET | Freq: Every day | ORAL | Status: DC
  Administered 2016-07-26: 20 mg via ORAL
  Filled 2016-07-26: qty 1

## 2016-07-26 MED ORDER — TRIAMCINOLONE ACETONIDE 0.5 % EX OINT: TOPICAL_OINTMENT | Freq: Two times a day (BID) | CUTANEOUS | Status: DC

## 2016-07-26 MED ORDER — CALCIUM ACETATE 667 MG PO CAPS: 1334.0000 mg | ORAL_CAPSULE | Freq: Three times a day (TID) | ORAL | Status: DC

## 2016-07-26 MED ORDER — CARVEDILOL 25 MG PO TABS: 25.0000 mg | ORAL_TABLET | Freq: Two times a day (BID) | ORAL | Status: DC

## 2016-07-26 MED ORDER — PANTOPRAZOLE SODIUM 40 MG PO TBEC: 40.0000 mg | DELAYED_RELEASE_TABLET | Freq: Two times a day (BID) | ORAL | Status: DC

## 2016-07-26 MED ORDER — DIPHENHYDRAMINE HCL 25 MG PO CAPS: 25.0000 mg | ORAL_CAPSULE | Freq: Two times a day (BID) | ORAL | Status: DC | PRN

## 2016-07-26 MED ORDER — NAPROXEN 500 MG PO TABS: 500.0000 mg | ORAL_TABLET | Freq: Two times a day (BID) | ORAL | Status: DC

## 2016-07-26 MED ORDER — BARIUM SULFATE 2.1 % PO SUSP: 450.0000 mL | ORAL | Status: AC

## 2016-07-26 MED ORDER — CYCLOSPORINE 0.05 % OP EMUL: 1.0000 [drp] | Freq: Two times a day (BID) | OPHTHALMIC | Status: DC

## 2016-07-26 MED ORDER — TRAZODONE HCL 100 MG PO TABS
100.0000 mg | ORAL_TABLET | Freq: Every day | ORAL | Status: DC
  Administered 2016-07-26: 100 mg via ORAL
  Filled 2016-07-26: qty 1

## 2016-07-26 MED ORDER — PSYLLIUM 52.3 % PO POWD: Freq: Two times a day (BID) | ORAL | Status: DC

## 2016-07-26 MED ORDER — CARVEDILOL 25 MG PO TABS
25.0000 mg | ORAL_TABLET | ORAL | Status: AC
  Administered 2016-07-26: 25 mg via ORAL
  Filled 2016-07-26: qty 1

## 2016-07-26 MED ORDER — CINACALCET HCL 30 MG PO TABS: 90.0000 mg | ORAL_TABLET | Freq: Every day | ORAL | Status: DC

## 2016-07-26 MED ORDER — RENA-VITE PO TABS: 1.0000 | ORAL_TABLET | Freq: Every day | ORAL | Status: DC

## 2016-07-26 MED ORDER — SODIUM CHLORIDE 0.9 % IV SOLN
Freq: Once | INTRAVENOUS | Status: AC
  Administered 2016-07-26: 20:00:00 via INTRAVENOUS

## 2016-07-26 MED ORDER — ACETAMINOPHEN 325 MG PO TABS: 650.0000 mg | ORAL_TABLET | Freq: Three times a day (TID) | ORAL | Status: DC | PRN

## 2016-07-26 MED ORDER — METHOCARBAMOL 500 MG PO TABS: 500.0000 mg | ORAL_TABLET | Freq: Three times a day (TID) | ORAL | Status: DC

## 2016-07-26 MED ORDER — LATANOPROST 0.005 % OP SOLN: 1.0000 [drp] | Freq: Every day | OPHTHALMIC | Status: DC

## 2016-07-26 MED ORDER — OXYCODONE HCL 5 MG PO TABS
5.0000 mg | ORAL_TABLET | ORAL | Status: DC | PRN
  Administered 2016-07-26: 5 mg via ORAL

## 2016-07-26 MED ORDER — CIPROFLOXACIN HCL 500 MG PO TABS
500.0000 mg | ORAL_TABLET | Freq: Two times a day (BID) | ORAL | Status: DC
  Administered 2016-07-26: 500 mg via ORAL
  Filled 2016-07-26: qty 1

## 2016-07-26 MED ORDER — LORATADINE 10 MG PO TABS
10.0000 mg | ORAL_TABLET | Freq: Every day | ORAL | Status: DC
  Administered 2016-07-26: 10 mg via ORAL
  Filled 2016-07-26: qty 1

## 2016-07-26 MED ORDER — DOCUSATE SODIUM 100 MG PO CAPS
100.0000 mg | ORAL_CAPSULE | Freq: Every day | ORAL | Status: DC
  Filled 2016-07-26: qty 1

## 2016-07-26 MED ORDER — METRONIDAZOLE 500 MG PO TABS
500.0000 mg | ORAL_TABLET | Freq: Three times a day (TID) | ORAL | Status: DC
  Administered 2016-07-26: 500 mg via ORAL
  Filled 2016-07-26: qty 1

## 2016-07-26 NOTE — ED Provider Notes (Signed)
[facility] Emergency Department Provider Note   ____________________________________________   First MD Initiated Contact with Patient 07/26/16 1020     (approximate)  I have reviewed the triage vital signs and the nursing notes.   HISTORY  Chief Complaint Weakness    HPI William Carpenter is a 61 y.o. male who is on dialysis. He reports he fell out of bed this morning and had trouble getting back into bed so he had to get help. He had his blood pressure checked at that time it was in the 160s and 170s. This morning later on after he got back in bed and slept for a little bit he got up and felt very weak. Blood pressure at that time was below 100.   Past Medical History:  Diagnosis Date  . Autoimmune disease ([place])    skin problem  . Dysphagia   . FSGS (focal segmental glomerulosclerosis)   . Hyperlipidemia   . Muscle weakness (generalized)   . Renal disorder    end stage   . Spinal stenosis, cervical region     Patient Active Problem List   Diagnosis Date Noted  . Leukocytosis 07/12/2016  . Hypotension 07/12/2016    Past Surgical History:  Procedure Laterality Date  . SPINAL FUSION  2018    Prior to Admission medications   Medication Sig Start Date End Date Taking? Authorizing Provider  acetaminophen (TYLENOL) 325 MG tablet Take 650 mg by mouth every 8 (eight) hours as needed (pain or fever).    Yes [provider]  atorvastatin (LIPITOR) 40 MG tablet Take 40 mg by mouth daily.    Yes [provider]  b complex-vitamin c-folic acid (NEPHRO-VITE) 0.8 MG TABS tablet Take 1 tablet by mouth at bedtime.   Yes [provider]  betamethasone dipropionate (DIPROLENE) 0.05 % ointment Apply topically 2 (two) times daily.   Yes [provider]  bisacodyl (DULCOLAX) 10 MG suppository Place 10 mg rectally daily as needed for moderate constipation.   Yes [provider]  calcium acetate (PHOSLO) 667 MG  capsule Take 667 mg by mouth 3 (three) times daily with meals.   Yes [provider]  Carboxymethylcellul-Glycerin (REFRESH OPTIVE SENSITIVE OP) Place 1 drop into both eyes every 4 (four) hours.   Yes [provider]  carvedilol (COREG) 25 MG tablet Take 25 mg by mouth 2 (two) times daily with a meal.    Yes [provider]  cetirizine (ZYRTEC) 10 MG tablet Take 10 mg by mouth daily.   Yes [provider]  cinacalcet (SENSIPAR) 90 MG tablet Take 90 mg by mouth daily.   Yes [provider]  clobetasol ointment (TEMOVATE) 1.28 % Apply 1 application topically 2 (two) times daily.   Yes [provider]  cycloSPORINE (RESTASIS) 0.05 % ophthalmic emulsion Place 1 drop into both eyes 2 (two) times daily.   Yes [provider]  docusate sodium (COLACE) 100 MG capsule Take 100 mg by mouth daily.   Yes [provider]  gabapentin (NEURONTIN) 100 MG capsule Take 100 mg by mouth at bedtime.   Yes [provider]  hydrALAZINE (APRESOLINE) 10 MG tablet Take 10 mg by mouth every 4 (four) hours as needed (systolic BP >[id number]).    Yes [provider]  hydroxypropyl methylcellulose / hypromellose (ISOPTO TEARS / GONIOVISC) 2.5 % ophthalmic solution 1 drop.   Yes [provider]  latanoprost (XALATAN) 0.005 % ophthalmic solution Place 1 drop into both  eyes at bedtime.   Yes [provider]  methocarbamol (ROBAXIN) 500 MG tablet Take 500 mg by mouth every 12 (twelve) hours as needed for muscle spasms.    Yes [provider]  naproxen (NAPROSYN) 500 MG tablet Take 500 mg by mouth 2 (two) times daily with a meal.   Yes [provider]  omeprazole (PRILOSEC) 20 MG capsule Take 20 mg by mouth 2 (two) times daily before a meal.   Yes [provider]  oxycodone (OXY-IR) 5 MG capsule Take 5-10 mg by mouth every 4 (four) hours as needed for pain.    Yes [provider]  polyvinyl  alcohol (LIQUIFILM TEARS) 1.4 % ophthalmic solution Place 1 drop into both eyes 4 (four) times daily as needed for dry eyes.   Yes [provider]  Psyllium (KONSYL-D PO) Take 5 mLs by mouth 2 (two) times daily.   Yes [provider]  senna-docusate (SENOKOT-S) 8.6-50 MG tablet Take 1 tablet by mouth 2 (two) times daily as needed (to prevent constipation; scheduled until bowel movement).    Yes [provider]  sertraline (ZOLOFT) 50 MG tablet Take 150 mg by mouth at bedtime.   Yes [provider]  timolol (BETIMOL) 0.5 % ophthalmic solution Place 1 drop into both eyes daily.   Yes [provider]  traZODone (DESYREL) 100 MG tablet Take 100 mg by mouth at bedtime.   Yes [provider]    Allergies Albumin (human); Aloe; Iodinated diagnostic agents; Iron dextran; Metoprolol tartrate; Penicillins; Quinine sulfate [quinine]; Rabeprazole; Sulfa antibiotics; and Trimethoprim  Family History  Problem Relation Age of Onset  . Hypertension Mother     Social History Social History  Substance Use Topics  . Smoking status: Never Smoker  . Smokeless tobacco: Never Used  . Alcohol use No    Review of Systems  Constitutional: No fever/chills Eyes: No visual changes. ENT: No sore throat. Cardiovascular: Denies chest pain. Respiratory: Denies shortness of breath. Gastrointestinal: No abdominal pain.  No nausea, no vomiting.  No diarrhea.  No constipation. Genitourinary: Negative for dysuria. Musculoskeletal: Negative for back pain. Skin: Negative for rash. Neurological: Negative for headaches, focal weakness or numbness.  ____________________________________________   PHYSICAL EXAM:  VITAL SIGNS: ED Triage Vitals  Enc Vitals Group     BP 07/26/16 1009 115/74     Pulse Rate 07/26/16 1009 70     Resp 07/26/16 1009 14     Temp --      Temp src --      SpO2 07/26/16 1009 98 %     Weight 07/26/16 1010 184 lb (83.5 kg)     Height  07/26/16 1010 5\' 10"  (1.778 m)     Head Circumference --      Peak Flow --      Pain Score 07/26/16 1008 4     Pain Loc --      Pain Edu? --      Excl. in [place]? --     Constitutional: Alert and oriented. Well appearing and in no acute distress. Eyes: Conjunctivae are normal. PERRL. EOMI. Head: Atraumatic. Nose: No congestion/rhinnorhea. Mouth/Throat: Mucous membranes are moist.  Oropharynx non-erythematous. Neck: No stridor.   Cardiovascular: Normal rate, regular rhythm. Grossly normal heart sounds.  Good peripheral circulation. Respiratory: Normal respiratory effort.  No retractions. Lungs CTAB. Gastrointestinal: Soft and nontender. No distention. No abdominal bruits. No CVA tenderness. Musculoskeletal: No lower extremity tenderness nor edema.  No joint effusions. Neurologic:  Normal speech and language. No gross focal neurologic deficits are appreciated. No gait instability. Skin:  Skin is warm, dry and intact. No rash noted. Psychiatric: Mood and affect are normal. Speech and behavior are normal.  ____________________________________________   LABS (all labs ordered are listed, but only abnormal results are displayed)  Labs Reviewed  COMPREHENSIVE METABOLIC PANEL - Abnormal; Notable for the following:       Result Value   Chloride 97 (*)    BUN 47 (*)    Creatinine, Ser 6.35 (*)    Albumin 2.8 (*)    Alkaline Phosphatase 245 (*)    Total Bilirubin 2.5 (*)    GFR calc non Af Amer 8 (*)    GFR calc Af Amer 10 (*)    All other components within normal limits  BRAIN NATRIURETIC PEPTIDE - Abnormal; Notable for the following:    B Natriuretic Peptide 365.0 (*)    All other components within normal limits  CBC WITH DIFFERENTIAL/PLATELET - Abnormal; Notable for the following:    WBC 14.8 (*)    RBC 3.07 (*)    Hemoglobin 10.4 (*)    HCT 31.6 (*)    MCV 102.9 (*)    RDW 15.9 (*)    Neutro Abs 8.9 (*)    Monocytes Absolute 1.5 (*)    Eosinophils Absolute 0.8 (*)     Basophils Absolute 0.2 (*)    All other components within normal limits  TROPONIN I  LACTIC ACID, PLASMA  TROPONIN I   ____________________________________________  EKG  EKG read and interpreted by me shows normal sinus rhythm rate of 64 rightward axis which is new from previous EKG S1 and lead 1 with a flipped T in lead 3 ____________________________________________  RADIOLOGY  Lumbar x-raysPRESSION: 1. No acute abnormality. 2. Chronic osseous changes of end-stage renal disease.   Electronically Signed   By: San Morelle M.D.   On: 07/26/2016 11:08 ____IMPRESSION: 1. No evidence for acute intracranial abnormality. 2. Right mastoid effusion. 3. Mild sinus changes. 4. Anterior fusion C3-C6. 5. Multiple small lytic lesions throughout the skullbase and cervical spine, compatible with renal osteodystrophy. 6. No evidence for acute cervical spine abnormality.   Electronically Signed   By: Nolon Nations M.D.   On: 07/26/2016 11:35________________________________________   PROCEDURES  Procedure(s) performed:   Procedures  Critical Care performed:   ____________________________________________   INITIAL IMPRESSION / ASSESSMENT AND PLAN / ED COURSE  Pertinent labs & imaging results that were available during my care of the patient were reviewed by me and considered in my medical decision making (see chart for details).  Patient has no longer hypotensive. He has no abdominal pain. He did have some ill-defined density by the pancreas on ultrasound on the 11th. Hopefully this will show up on the CT scan. Dr. for block will monitor him until such time CT scans are done.      ____________________________________________   FINAL CLINICAL IMPRESSION(S) / ED DIAGNOSES  Final diagnoses:  Hypotension, unspecified hypotension type      NEW MEDICATIONS STARTED DURING THIS VISIT:  New Prescriptions   No medications on file     Note:  This document  was prepared using Dragon voice recognition software and may include unintentional dictation errors.    Nena Polio, MD 07/26/16 [phone]

## 2016-07-26 NOTE — ED Notes (Signed)
[facility] form reviewed prior to pt transfer. Appears complete at this time.

## 2016-07-26 NOTE — ED Triage Notes (Signed)
Patient from home via [facility]. Reports he rolled out of bed this morning and was too weak to get back into bed. Patient's family took BP after he calmed down and reports it was low. Patient reports pain in back and neck but states he has had this since a needle decompression. Patients last dialysis treatment was Saturday. Patient alert and oriented.

## 2016-07-26 NOTE — ED Notes (Signed)
Family expresses concerns over pt missing evening dosage of carvedilol. MD made aware.

## 2016-07-26 NOTE — ED Provider Notes (Signed)
Clinical Course as of Jul 27 2306  Mon Jul 26, 2016  1530 Assuming care from Dr. Cinda Quest.  In short, William Carpenter is a 61 y.o. male with a chief complaint of weakness.  Refer to the original H&P for additional details.  The current plan of care is to follow up scans and reassess.   [CF]  1805 I had a lengthy conversation with the patient's family and then the patient after he awoke.  We discussed the chronic condition and failure to thrive but that it has been difficult to identify a specific acute cause of his weakness, fall, hypotension (which has resolved) etc.  I discussed his fecal impaction and apparent stercoral colitis.  Family is very concerned about his overall worsening status.  He is also ESRD on HD and needs dialysis tomorrow.   [facility].  [facility] not [facility], I will discuss with hospitalist here at [facility].  Family agrees with plan.  [CF]  1808 soap suds enema with colace to try and resolve fecal impaction/constipation  [CF]  1908 Ordering MIVF @ 166mL/hr while awaiting disposition plan  [CF]  2109 Patient reports that he had a large bowel movement.  Reportedly the family and patient declined the enema.  Giving the mag citrate and docusate by mouth since they declined the enema.  Ordered all his regular medications after pharm tech performed reconciliation.    Given that the leukocytosis and question of diverticulitis on CT scan, I will order empiric antibiotics for the possibility of diverticulitis, although he had no significant tenderness to palpation on exam.  However, this might explain his leukocytosis and worsening overall status.  [CF]  2229 Spoke by phone with Dr. Robet Leu at [facility].  Discussed patient, and she accepted him for transfer.  Let her know about the empiric abx for questionable diverticulitis.  We will transport via the next  available method.  I updated the patient and his family.  They understand and agree with the plan.  [CF]    Clinical Course User Index [CF] Hinda Kehr, MD    Final diagnoses:  Hypotension, unspecified hypotension type  Generalized weakness  Failure to thrive in adult  Fecal impaction of colon ([place])  Acalculous cholecystitis  Total bilirubin, elevated  Leukocytosis, unspecified type  ESRD on hemodialysis ([place])  Diverticulitis      Hinda Kehr, MD 07/26/16 2308

## 2016-07-26 NOTE — ED Notes (Signed)
Pt assisted to the toilet in room. Pt was able to produce stool. Unsure of amount of stool due to pt flushing toilet before assessment of stool.

## 2016-07-27 DIAGNOSIS — K819 Cholecystitis, unspecified: Secondary | ICD-10-CM

## 2016-08-03 DIAGNOSIS — N186 End stage renal disease: Secondary | ICD-10-CM | POA: Diagnosis not present

## 2016-08-03 DIAGNOSIS — Z992 Dependence on renal dialysis: Secondary | ICD-10-CM | POA: Diagnosis not present

## 2016-08-20 DIAGNOSIS — F329 Major depressive disorder, single episode, unspecified: Secondary | ICD-10-CM | POA: Diagnosis not present

## 2016-08-20 DIAGNOSIS — N186 End stage renal disease: Secondary | ICD-10-CM | POA: Diagnosis not present

## 2016-08-20 DIAGNOSIS — K5900 Constipation, unspecified: Secondary | ICD-10-CM | POA: Diagnosis not present

## 2016-08-20 DIAGNOSIS — Z9181 History of falling: Secondary | ICD-10-CM | POA: Diagnosis not present

## 2016-08-20 DIAGNOSIS — R1312 Dysphagia, oropharyngeal phase: Secondary | ICD-10-CM | POA: Diagnosis not present

## 2016-08-20 DIAGNOSIS — G4700 Insomnia, unspecified: Secondary | ICD-10-CM | POA: Diagnosis not present

## 2016-08-20 DIAGNOSIS — R41841 Cognitive communication deficit: Secondary | ICD-10-CM | POA: Diagnosis not present

## 2016-08-20 DIAGNOSIS — M25561 Pain in right knee: Secondary | ICD-10-CM | POA: Diagnosis not present

## 2016-08-20 DIAGNOSIS — M4802 Spinal stenosis, cervical region: Secondary | ICD-10-CM | POA: Diagnosis not present

## 2016-08-20 DIAGNOSIS — I10 Essential (primary) hypertension: Secondary | ICD-10-CM | POA: Diagnosis not present

## 2016-08-20 DIAGNOSIS — M542 Cervicalgia: Secondary | ICD-10-CM | POA: Diagnosis not present

## 2016-08-20 DIAGNOSIS — Z992 Dependence on renal dialysis: Secondary | ICD-10-CM | POA: Diagnosis not present

## 2016-08-20 DIAGNOSIS — M545 Low back pain: Secondary | ICD-10-CM | POA: Diagnosis not present

## 2016-08-20 DIAGNOSIS — F3289 Other specified depressive episodes: Secondary | ICD-10-CM | POA: Diagnosis not present

## 2016-08-20 DIAGNOSIS — G8929 Other chronic pain: Secondary | ICD-10-CM | POA: Diagnosis not present

## 2016-08-20 DIAGNOSIS — M62838 Other muscle spasm: Secondary | ICD-10-CM | POA: Diagnosis not present

## 2016-08-20 DIAGNOSIS — E784 Other hyperlipidemia: Secondary | ICD-10-CM | POA: Diagnosis not present

## 2016-08-20 DIAGNOSIS — Z5189 Encounter for other specified aftercare: Secondary | ICD-10-CM | POA: Diagnosis not present

## 2016-08-20 DIAGNOSIS — K219 Gastro-esophageal reflux disease without esophagitis: Secondary | ICD-10-CM | POA: Diagnosis not present

## 2016-08-20 DIAGNOSIS — H409 Unspecified glaucoma: Secondary | ICD-10-CM | POA: Diagnosis not present

## 2016-08-20 DIAGNOSIS — M6281 Muscle weakness (generalized): Secondary | ICD-10-CM | POA: Diagnosis not present

## 2016-08-20 DIAGNOSIS — R2681 Unsteadiness on feet: Secondary | ICD-10-CM | POA: Diagnosis not present

## 2016-08-24 DIAGNOSIS — K219 Gastro-esophageal reflux disease without esophagitis: Secondary | ICD-10-CM | POA: Diagnosis not present

## 2016-08-24 DIAGNOSIS — F329 Major depressive disorder, single episode, unspecified: Secondary | ICD-10-CM | POA: Diagnosis not present

## 2016-08-24 DIAGNOSIS — N186 End stage renal disease: Secondary | ICD-10-CM | POA: Diagnosis not present

## 2016-08-24 DIAGNOSIS — K5900 Constipation, unspecified: Secondary | ICD-10-CM | POA: Diagnosis not present

## 2016-08-24 DIAGNOSIS — M6281 Muscle weakness (generalized): Secondary | ICD-10-CM | POA: Diagnosis not present

## 2016-08-24 DIAGNOSIS — I10 Essential (primary) hypertension: Secondary | ICD-10-CM | POA: Diagnosis not present

## 2016-08-24 DIAGNOSIS — Z992 Dependence on renal dialysis: Secondary | ICD-10-CM | POA: Diagnosis not present

## 2016-08-24 DIAGNOSIS — H409 Unspecified glaucoma: Secondary | ICD-10-CM | POA: Diagnosis not present

## 2016-08-24 DIAGNOSIS — M25561 Pain in right knee: Secondary | ICD-10-CM | POA: Diagnosis not present

## 2016-08-24 DIAGNOSIS — M545 Low back pain: Secondary | ICD-10-CM | POA: Diagnosis not present

## 2016-09-01 DIAGNOSIS — N186 End stage renal disease: Secondary | ICD-10-CM | POA: Diagnosis not present

## 2016-09-01 DIAGNOSIS — I10 Essential (primary) hypertension: Secondary | ICD-10-CM | POA: Diagnosis not present

## 2016-09-01 DIAGNOSIS — K219 Gastro-esophageal reflux disease without esophagitis: Secondary | ICD-10-CM | POA: Diagnosis not present

## 2016-09-01 DIAGNOSIS — K5900 Constipation, unspecified: Secondary | ICD-10-CM | POA: Diagnosis not present

## 2016-09-01 DIAGNOSIS — M542 Cervicalgia: Secondary | ICD-10-CM | POA: Diagnosis not present

## 2016-09-01 DIAGNOSIS — Z992 Dependence on renal dialysis: Secondary | ICD-10-CM | POA: Diagnosis not present

## 2016-09-01 DIAGNOSIS — M6281 Muscle weakness (generalized): Secondary | ICD-10-CM | POA: Diagnosis not present

## 2016-09-01 DIAGNOSIS — M25561 Pain in right knee: Secondary | ICD-10-CM | POA: Diagnosis not present

## 2016-09-01 DIAGNOSIS — M545 Low back pain: Secondary | ICD-10-CM | POA: Diagnosis not present

## 2016-09-01 DIAGNOSIS — H409 Unspecified glaucoma: Secondary | ICD-10-CM | POA: Diagnosis not present

## 2016-09-01 DIAGNOSIS — F329 Major depressive disorder, single episode, unspecified: Secondary | ICD-10-CM | POA: Diagnosis not present

## 2016-09-03 DIAGNOSIS — N186 End stage renal disease: Secondary | ICD-10-CM | POA: Diagnosis not present

## 2016-09-03 DIAGNOSIS — Z992 Dependence on renal dialysis: Secondary | ICD-10-CM | POA: Diagnosis not present

## 2016-09-10 DIAGNOSIS — R21 Rash and other nonspecific skin eruption: Secondary | ICD-10-CM | POA: Diagnosis not present

## 2016-09-10 DIAGNOSIS — N186 End stage renal disease: Secondary | ICD-10-CM | POA: Diagnosis not present

## 2016-09-10 DIAGNOSIS — G8929 Other chronic pain: Secondary | ICD-10-CM | POA: Diagnosis not present

## 2016-09-10 DIAGNOSIS — Z992 Dependence on renal dialysis: Secondary | ICD-10-CM | POA: Diagnosis not present

## 2016-09-10 DIAGNOSIS — I120 Hypertensive chronic kidney disease with stage 5 chronic kidney disease or end stage renal disease: Secondary | ICD-10-CM | POA: Diagnosis not present

## 2016-09-10 DIAGNOSIS — E785 Hyperlipidemia, unspecified: Secondary | ICD-10-CM | POA: Diagnosis not present

## 2016-09-10 DIAGNOSIS — G4733 Obstructive sleep apnea (adult) (pediatric): Secondary | ICD-10-CM | POA: Diagnosis not present

## 2016-09-13 DIAGNOSIS — E785 Hyperlipidemia, unspecified: Secondary | ICD-10-CM | POA: Diagnosis not present

## 2016-09-13 DIAGNOSIS — R21 Rash and other nonspecific skin eruption: Secondary | ICD-10-CM | POA: Diagnosis not present

## 2016-09-13 DIAGNOSIS — N186 End stage renal disease: Secondary | ICD-10-CM | POA: Diagnosis not present

## 2016-09-13 DIAGNOSIS — I120 Hypertensive chronic kidney disease with stage 5 chronic kidney disease or end stage renal disease: Secondary | ICD-10-CM | POA: Diagnosis not present

## 2016-09-13 DIAGNOSIS — G8929 Other chronic pain: Secondary | ICD-10-CM | POA: Diagnosis not present

## 2016-09-13 DIAGNOSIS — G4733 Obstructive sleep apnea (adult) (pediatric): Secondary | ICD-10-CM | POA: Diagnosis not present

## 2016-09-17 DIAGNOSIS — G4733 Obstructive sleep apnea (adult) (pediatric): Secondary | ICD-10-CM | POA: Diagnosis not present

## 2016-09-17 DIAGNOSIS — N186 End stage renal disease: Secondary | ICD-10-CM | POA: Diagnosis not present

## 2016-09-17 DIAGNOSIS — R21 Rash and other nonspecific skin eruption: Secondary | ICD-10-CM | POA: Diagnosis not present

## 2016-09-17 DIAGNOSIS — G8929 Other chronic pain: Secondary | ICD-10-CM | POA: Diagnosis not present

## 2016-09-17 DIAGNOSIS — E785 Hyperlipidemia, unspecified: Secondary | ICD-10-CM | POA: Diagnosis not present

## 2016-09-17 DIAGNOSIS — I120 Hypertensive chronic kidney disease with stage 5 chronic kidney disease or end stage renal disease: Secondary | ICD-10-CM | POA: Diagnosis not present

## 2016-09-19 DIAGNOSIS — E785 Hyperlipidemia, unspecified: Secondary | ICD-10-CM | POA: Diagnosis not present

## 2016-09-19 DIAGNOSIS — G8929 Other chronic pain: Secondary | ICD-10-CM | POA: Diagnosis not present

## 2016-09-19 DIAGNOSIS — R21 Rash and other nonspecific skin eruption: Secondary | ICD-10-CM | POA: Diagnosis not present

## 2016-09-19 DIAGNOSIS — I120 Hypertensive chronic kidney disease with stage 5 chronic kidney disease or end stage renal disease: Secondary | ICD-10-CM | POA: Diagnosis not present

## 2016-09-19 DIAGNOSIS — N186 End stage renal disease: Secondary | ICD-10-CM | POA: Diagnosis not present

## 2016-09-19 DIAGNOSIS — G4733 Obstructive sleep apnea (adult) (pediatric): Secondary | ICD-10-CM | POA: Diagnosis not present

## 2016-09-20 DIAGNOSIS — N186 End stage renal disease: Secondary | ICD-10-CM | POA: Diagnosis not present

## 2016-09-20 DIAGNOSIS — G8929 Other chronic pain: Secondary | ICD-10-CM | POA: Diagnosis not present

## 2016-09-20 DIAGNOSIS — E785 Hyperlipidemia, unspecified: Secondary | ICD-10-CM | POA: Diagnosis not present

## 2016-09-20 DIAGNOSIS — R21 Rash and other nonspecific skin eruption: Secondary | ICD-10-CM | POA: Diagnosis not present

## 2016-09-20 DIAGNOSIS — I120 Hypertensive chronic kidney disease with stage 5 chronic kidney disease or end stage renal disease: Secondary | ICD-10-CM | POA: Diagnosis not present

## 2016-09-20 DIAGNOSIS — G4733 Obstructive sleep apnea (adult) (pediatric): Secondary | ICD-10-CM | POA: Diagnosis not present

## 2016-09-21 DIAGNOSIS — R21 Rash and other nonspecific skin eruption: Secondary | ICD-10-CM | POA: Diagnosis not present

## 2016-09-21 DIAGNOSIS — G8929 Other chronic pain: Secondary | ICD-10-CM | POA: Diagnosis not present

## 2016-09-21 DIAGNOSIS — G4733 Obstructive sleep apnea (adult) (pediatric): Secondary | ICD-10-CM | POA: Diagnosis not present

## 2016-09-21 DIAGNOSIS — N186 End stage renal disease: Secondary | ICD-10-CM | POA: Diagnosis not present

## 2016-09-21 DIAGNOSIS — E785 Hyperlipidemia, unspecified: Secondary | ICD-10-CM | POA: Diagnosis not present

## 2016-09-21 DIAGNOSIS — I120 Hypertensive chronic kidney disease with stage 5 chronic kidney disease or end stage renal disease: Secondary | ICD-10-CM | POA: Diagnosis not present

## 2016-09-22 DIAGNOSIS — E785 Hyperlipidemia, unspecified: Secondary | ICD-10-CM | POA: Diagnosis not present

## 2016-09-22 DIAGNOSIS — R21 Rash and other nonspecific skin eruption: Secondary | ICD-10-CM | POA: Diagnosis not present

## 2016-09-22 DIAGNOSIS — G8929 Other chronic pain: Secondary | ICD-10-CM | POA: Diagnosis not present

## 2016-09-22 DIAGNOSIS — G4733 Obstructive sleep apnea (adult) (pediatric): Secondary | ICD-10-CM | POA: Diagnosis not present

## 2016-09-22 DIAGNOSIS — N186 End stage renal disease: Secondary | ICD-10-CM | POA: Diagnosis not present

## 2016-09-22 DIAGNOSIS — I120 Hypertensive chronic kidney disease with stage 5 chronic kidney disease or end stage renal disease: Secondary | ICD-10-CM | POA: Diagnosis not present

## 2016-09-24 DIAGNOSIS — R21 Rash and other nonspecific skin eruption: Secondary | ICD-10-CM | POA: Diagnosis not present

## 2016-09-24 DIAGNOSIS — G4733 Obstructive sleep apnea (adult) (pediatric): Secondary | ICD-10-CM | POA: Diagnosis not present

## 2016-09-24 DIAGNOSIS — G8929 Other chronic pain: Secondary | ICD-10-CM | POA: Diagnosis not present

## 2016-09-24 DIAGNOSIS — I120 Hypertensive chronic kidney disease with stage 5 chronic kidney disease or end stage renal disease: Secondary | ICD-10-CM | POA: Diagnosis not present

## 2016-09-24 DIAGNOSIS — N186 End stage renal disease: Secondary | ICD-10-CM | POA: Diagnosis not present

## 2016-09-24 DIAGNOSIS — E785 Hyperlipidemia, unspecified: Secondary | ICD-10-CM | POA: Diagnosis not present

## 2016-09-29 DIAGNOSIS — E785 Hyperlipidemia, unspecified: Secondary | ICD-10-CM | POA: Diagnosis not present

## 2016-09-29 DIAGNOSIS — G8929 Other chronic pain: Secondary | ICD-10-CM | POA: Diagnosis not present

## 2016-09-29 DIAGNOSIS — N186 End stage renal disease: Secondary | ICD-10-CM | POA: Diagnosis not present

## 2016-09-29 DIAGNOSIS — I120 Hypertensive chronic kidney disease with stage 5 chronic kidney disease or end stage renal disease: Secondary | ICD-10-CM | POA: Diagnosis not present

## 2016-09-29 DIAGNOSIS — G4733 Obstructive sleep apnea (adult) (pediatric): Secondary | ICD-10-CM | POA: Diagnosis not present

## 2016-09-29 DIAGNOSIS — R21 Rash and other nonspecific skin eruption: Secondary | ICD-10-CM | POA: Diagnosis not present

## 2016-09-30 ENCOUNTER — Ambulatory Visit
Admission: EM | Admit: 2016-09-30 | Discharge: 2016-09-30 | Disposition: A | Discharge status: Home / Self Care | Payer: Medicare Other | Attending: Family Medicine | Admitting: Family Medicine

## 2016-09-30 DIAGNOSIS — S99929A Unspecified injury of unspecified foot, initial encounter: Secondary | ICD-10-CM | POA: Diagnosis not present

## 2016-09-30 DIAGNOSIS — S90211A Contusion of right great toe with damage to nail, initial encounter: Secondary | ICD-10-CM | POA: Diagnosis not present

## 2016-09-30 HISTORY — DX: Bullous disorder, unspecified: L13.9

## 2016-09-30 HISTORY — DX: Dependence on renal dialysis: Z99.2

## 2016-09-30 MED ORDER — MUPIROCIN CALCIUM 2 % EX CREA: 1.0000 "application " | TOPICAL_CREAM | Freq: Two times a day (BID) | CUTANEOUS | 0 refills | Status: DC

## 2016-09-30 NOTE — ED Provider Notes (Signed)
[facility]    CSN: [id number] Arrival date & time: 09/30/16  1927     History   Chief Complaint Chief Complaint  Patient presents with  . Toe Pain    HPI William Carpenter is a 61 y.o. male.   Patient is a 61 year old white male total medical problems including bullous phemphigoid and multiple ulcerations nail issues of his lower feet. She reports that they've been trying to get him into see a podiatrist the [place] on to find out the [place] locally does not have a podiatrist they finally approved a podiatry visit with a local podiatrist only to wait for 2 weeks to take him to that. Since then she put him to bed this afternoon when she came back from the funeral home for service and pullback is cover cover wrap or basically ripped the right big toenail off about 90%. The toenail is basically hanging and a 90 angle and they're concerned and realized nail has to be removed but they do not want to wait 2 weeks to have that done. Along with autoimmune diseases skin problems he's also had a bone graft in the cervical spine which keeps him from being placed on prednisone he's been on dialysis now for about 34 years and has a history of dysphagia and multiple skin lesions   The history is provided by the patient and the spouse. No language interpreter was used.  Toe Pain  This is a new problem. The current episode started 3 to 5 hours ago. The problem occurs constantly. The problem has not changed since onset.Pertinent negatives include no chest pain, no abdominal pain, no headaches and no shortness of breath. Nothing aggravates the symptoms.    Past Medical History:  Diagnosis Date  . Autoimmune disease ([place])    skin problem  . Bullous dermatosis   . Dialysis patient ([place])   . Dysphagia   . FSGS (focal segmental glomerulosclerosis)   . Hyperlipidemia   . Muscle weakness (generalized)   . Renal disorder    end stage   . Spinal stenosis, cervical region     Patient Active  Problem List   Diagnosis Date Noted  . Acalculous cholecystitis   . Leukocytosis 07/12/2016  . Hypotension 07/12/2016    Past Surgical History:  Procedure Laterality Date  . SPINAL FUSION  2018       Home Medications    Prior to Admission medications   Medication Sig Start Date End Date Taking? Authorizing Provider  amLODipine (NORVASC) 5 MG tablet Take 5 mg by mouth daily. Takes only M-W-F-Sun   Yes [provider]  doxycycline (VIBRAMYCIN) 100 MG capsule Take 100 mg by mouth 2 (two) times daily.   Yes [provider]  lactulose (CHRONULAC) 10 GM/15ML solution Take by mouth 3 (three) times daily.   Yes [provider]  acetaminophen (TYLENOL) 325 MG tablet Take 650 mg by mouth every 8 (eight) hours as needed (pain or fever).     [provider]  atorvastatin (LIPITOR) 40 MG tablet Take 20 mg by mouth daily.     [provider]  b complex-vitamin c-folic acid (NEPHRO-VITE) 0.8 MG TABS tablet Take 1 tablet by mouth at bedtime.    [provider]  betamethasone dipropionate (DIPROLENE) 0.05 % ointment Apply topically 2 (two) times daily.    [provider]  bisacodyl (DULCOLAX) 10 MG suppository Place 10 mg rectally daily as needed for moderate constipation.    [provider]  calcium acetate (PHOSLO) 667 MG capsule Take 1,334 mg by mouth 3 (three) times daily with meals.     [provider]  Carboxymethylcellul-Glycerin (REFRESH OPTIVE SENSITIVE OP) Place 1 drop into both eyes every 4 (four) hours.    [provider]  carvedilol (COREG) 25 MG tablet Take 25 mg by mouth 2 (two) times daily with a meal.     [provider]  cetirizine (ZYRTEC) 10 MG tablet Take 10 mg by mouth daily.    [provider]  cinacalcet (SENSIPAR) 90 MG tablet Take 90 mg by mouth daily.    [provider]  clobetasol ointment (TEMOVATE) 4.65 % Apply 1 application topically 2 (two) times daily.     [provider]  cycloSPORINE (RESTASIS) 0.05 % ophthalmic emulsion Place 1 drop into both eyes 2 (two) times daily.    [provider]  diphenhydrAMINE (BENADRYL) 25 mg capsule Take 25 mg by mouth 2 (two) times daily as needed.    [provider]  docusate sodium (COLACE) 100 MG capsule Take 100 mg by mouth daily.    [provider]  gabapentin (NEURONTIN) 300 MG capsule Take 100 mg by mouth at bedtime.    [provider]  hydrALAZINE (APRESOLINE) 10 MG tablet Take 10 mg by mouth every 4 (four) hours as needed (systolic BP >681EXNT).     [provider]  latanoprost (XALATAN) 0.005 % ophthalmic solution Place 1 drop into both eyes at bedtime.    [provider]  methocarbamol (ROBAXIN) 500 MG tablet Take 500 mg by mouth 3 (three) times daily.     [provider]  mupirocin cream (BACTROBAN) 2 % Apply 1 application topically 2 (two) times daily. 09/30/16   Frederich Cha, MD  naproxen (NAPROSYN) 500 MG tablet Take 500 mg by mouth 2 (two) times daily with a meal.    [provider]  omeprazole (PRILOSEC) 20 MG capsule Take 20 mg by mouth 2 (two) times daily before a meal.    [provider]  oxycodone (OXY-IR) 5 MG capsule Take 5-10 mg by mouth every 4 (four) hours as needed for pain.     [provider]  polyvinyl alcohol (LIQUIFILM TEARS) 1.4 % ophthalmic solution Place 1 drop into both eyes 4 (four) times daily as needed for dry eyes.    [provider]  Psyllium (KONSYL-D PO) Take 5 mLs by mouth 2 (two) times daily.    [provider]  senna-docusate (SENOKOT-S) 8.6-50 MG tablet Take 1 tablet by mouth 2 (two) times daily as needed (to prevent constipation; scheduled until bowel movement).     [provider]  sertraline (ZOLOFT) 50 MG tablet Take 150 mg by mouth at bedtime.    [provider]  simethicone (MYLICON) 80 MG chewable tablet Chew 80 mg by mouth 3 (three)  times daily.    [provider]  timolol (BETIMOL) 0.5 % ophthalmic solution Place 1 drop into both eyes daily.    [provider]  traZODone (DESYREL) 100 MG tablet Take 100 mg by mouth at bedtime.    [provider]    Family History Family History  Problem Relation Age of Onset  . Hypertension Mother     Social History Social History  Substance Use Topics  . Smoking status: Never Smoker  . Smokeless tobacco: Never Used  . Alcohol use No     Allergies   Albumin (human); Aloe; Iodinated diagnostic agents; Iron dextran; Metoprolol tartrate;  Penicillins; Quinine sulfate [quinine]; Rabeprazole; Sulfa antibiotics; and Trimethoprim   Review of Systems Review of Systems  Respiratory: Negative for shortness of breath.   Cardiovascular: Negative for chest pain.  Gastrointestinal: Negative for abdominal pain.  Skin: Positive for wound.  Neurological: Negative for headaches.  All other systems reviewed and are negative.    Physical Exam Triage Vital Signs ED Triage Vitals  Enc Vitals Group     BP 09/30/16 2059 (!) 176/94     Pulse Rate 09/30/16 2059 65     Resp 09/30/16 2059 18     Temp 09/30/16 2059 98.2 F (36.8 C)     Temp Source 09/30/16 2059 Oral     SpO2 09/30/16 2059 98 %     Weight 09/30/16 2055 163 lb 2.3 oz (74 kg)     Height 09/30/16 2055 5\' 10"  (1.778 m)     Head Circumference --      Peak Flow --      Pain Score 09/30/16 2044 4     Pain Loc --      Pain Edu? --      Excl. in [place]? --    No data found.   Updated Vital Signs BP (!) 176/94 (BP Location: Right Arm)   Pulse 65   Temp 98.2 F (36.8 C) (Oral)   Resp 18   Ht 5\' 10"  (1.778 m)   Wt 163 lb 2.3 oz (74 kg)   SpO2 98%   BMI 23.41 kg/m   Visual Acuity Right Eye Distance:   Left Eye Distance:   Bilateral Distance:    Right Eye Near:   Left Eye Near:    Bilateral Near:     Physical Exam  Constitutional: He is cooperative.  Non-toxic appearance. He has a  sickly appearance. He appears ill.  HENT:  Head: Normocephalic and atraumatic.  Eyes: Pupils are equal, round, and reactive to light.  Neck: Normal range of motion.  Pulmonary/Chest: Effort normal.  Musculoskeletal: Normal range of motion.       Feet:  Right big toe has very toenail at about 90 angle the nail plate under the toe is not actively bleeding which would indicate definite this toenail several days ago minimal  Neurological: He is alert.  Skin: Skin is warm.  Psychiatric: He has a normal mood and affect.  Vitals reviewed.    UC Treatments / Results  Labs (all labs ordered are listed, but only abnormal results are displayed) Labs Reviewed - No data to display  EKG  EKG Interpretation None       Radiology No results found.  Procedures .Nail Removal Date/Time: 09/30/2016 9:24 PM Performed by: Frederich Cha Authorized by: Frederich Cha   Consent:    Consent obtained:  Verbal Location:    Foot:  R big toe Anesthesia (see MAR for exact dosages):    Anesthesia method:  None Nail Removal:    Nail removed:  Complete   Nail bed repaired: no   Trephination:    Subungual hematoma drained: no   Ingrown nail:    Wedge excision of skin: no     Nail matrix removed or ablated:  None Post-procedure details:    Dressing:  Non-adhesive packing strip   Patient tolerance of procedure:  Tolerated well, no immediate complications Comments:     Using just a curved hemostat the nail was removed in its entirety no prep was done no prep was needed no anesthesia was given. Patient tolerance he did quite  well. Follow-up with PCP as needed or podiatrist as scheduled or prescribed Bactrim ointment to use on the nail twice a day   (including critical care time)  Medications Ordered in UC Medications - No data to display   Initial Impression / Assessment and Plan / UC Course  I have reviewed the triage vital signs and the nursing notes.  Pertinent labs & imaging results that  were available during my care of the patient were reviewed by me and considered in my medical decision making (see chart for details).     As above patient tolerance toenail removal.  Final Clinical Impressions(s) / UC Diagnoses   Final diagnoses:  Injury of nail bed of toe  Contusion of right great toe with damage to nail, initial encounter    New Prescriptions New Prescriptions   MUPIROCIN CREAM (BACTROBAN) 2 %    Apply 1 application topically 2 (two) times daily.   Note: This dictation was prepared with Dragon dictation along with smaller phrase technology. Any transcriptional errors that result from this process are unintentional. Controlled Substance Prescriptions [place] Controlled Substance Registry consulted? Not Applicable   Frederich Cha, MD 09/30/16 2126

## 2016-09-30 NOTE — ED Triage Notes (Signed)
Pt reports he pulled off the covers this morning and his right great toenail nearly came off. Pain 4/10. Has been having foot and toe issues for a few months. Has been waiting for podiatry referral and just got his appointment with [facility] clinic for Oct 12th.

## 2016-10-01 DIAGNOSIS — G4733 Obstructive sleep apnea (adult) (pediatric): Secondary | ICD-10-CM | POA: Diagnosis not present

## 2016-10-01 DIAGNOSIS — I120 Hypertensive chronic kidney disease with stage 5 chronic kidney disease or end stage renal disease: Secondary | ICD-10-CM | POA: Diagnosis not present

## 2016-10-01 DIAGNOSIS — R21 Rash and other nonspecific skin eruption: Secondary | ICD-10-CM | POA: Diagnosis not present

## 2016-10-01 DIAGNOSIS — N186 End stage renal disease: Secondary | ICD-10-CM | POA: Diagnosis not present

## 2016-10-01 DIAGNOSIS — E785 Hyperlipidemia, unspecified: Secondary | ICD-10-CM | POA: Diagnosis not present

## 2016-10-01 DIAGNOSIS — G8929 Other chronic pain: Secondary | ICD-10-CM | POA: Diagnosis not present

## 2016-10-03 DIAGNOSIS — Z992 Dependence on renal dialysis: Secondary | ICD-10-CM | POA: Diagnosis not present

## 2016-10-03 DIAGNOSIS — N186 End stage renal disease: Secondary | ICD-10-CM | POA: Diagnosis not present

## 2016-10-04 DIAGNOSIS — G8929 Other chronic pain: Secondary | ICD-10-CM | POA: Diagnosis not present

## 2016-10-04 DIAGNOSIS — G4733 Obstructive sleep apnea (adult) (pediatric): Secondary | ICD-10-CM | POA: Diagnosis not present

## 2016-10-04 DIAGNOSIS — N186 End stage renal disease: Secondary | ICD-10-CM | POA: Diagnosis not present

## 2016-10-04 DIAGNOSIS — I120 Hypertensive chronic kidney disease with stage 5 chronic kidney disease or end stage renal disease: Secondary | ICD-10-CM | POA: Diagnosis not present

## 2016-10-04 DIAGNOSIS — R21 Rash and other nonspecific skin eruption: Secondary | ICD-10-CM | POA: Diagnosis not present

## 2016-10-04 DIAGNOSIS — E785 Hyperlipidemia, unspecified: Secondary | ICD-10-CM | POA: Diagnosis not present

## 2016-10-06 DIAGNOSIS — G8929 Other chronic pain: Secondary | ICD-10-CM | POA: Diagnosis not present

## 2016-10-06 DIAGNOSIS — I120 Hypertensive chronic kidney disease with stage 5 chronic kidney disease or end stage renal disease: Secondary | ICD-10-CM | POA: Diagnosis not present

## 2016-10-06 DIAGNOSIS — R21 Rash and other nonspecific skin eruption: Secondary | ICD-10-CM | POA: Diagnosis not present

## 2016-10-06 DIAGNOSIS — N186 End stage renal disease: Secondary | ICD-10-CM | POA: Diagnosis not present

## 2016-10-06 DIAGNOSIS — E785 Hyperlipidemia, unspecified: Secondary | ICD-10-CM | POA: Diagnosis not present

## 2016-10-06 DIAGNOSIS — G4733 Obstructive sleep apnea (adult) (pediatric): Secondary | ICD-10-CM | POA: Diagnosis not present

## 2016-10-08 DIAGNOSIS — I120 Hypertensive chronic kidney disease with stage 5 chronic kidney disease or end stage renal disease: Secondary | ICD-10-CM | POA: Diagnosis not present

## 2016-10-08 DIAGNOSIS — R21 Rash and other nonspecific skin eruption: Secondary | ICD-10-CM | POA: Diagnosis not present

## 2016-10-08 DIAGNOSIS — E785 Hyperlipidemia, unspecified: Secondary | ICD-10-CM | POA: Diagnosis not present

## 2016-10-08 DIAGNOSIS — N186 End stage renal disease: Secondary | ICD-10-CM | POA: Diagnosis not present

## 2016-10-08 DIAGNOSIS — G8929 Other chronic pain: Secondary | ICD-10-CM | POA: Diagnosis not present

## 2016-10-08 DIAGNOSIS — G4733 Obstructive sleep apnea (adult) (pediatric): Secondary | ICD-10-CM | POA: Diagnosis not present

## 2016-10-11 DIAGNOSIS — I120 Hypertensive chronic kidney disease with stage 5 chronic kidney disease or end stage renal disease: Secondary | ICD-10-CM | POA: Diagnosis not present

## 2016-10-11 DIAGNOSIS — E785 Hyperlipidemia, unspecified: Secondary | ICD-10-CM | POA: Diagnosis not present

## 2016-10-11 DIAGNOSIS — R21 Rash and other nonspecific skin eruption: Secondary | ICD-10-CM | POA: Diagnosis not present

## 2016-10-11 DIAGNOSIS — N186 End stage renal disease: Secondary | ICD-10-CM | POA: Diagnosis not present

## 2016-10-11 DIAGNOSIS — G4733 Obstructive sleep apnea (adult) (pediatric): Secondary | ICD-10-CM | POA: Diagnosis not present

## 2016-10-11 DIAGNOSIS — G8929 Other chronic pain: Secondary | ICD-10-CM | POA: Diagnosis not present

## 2016-10-15 DIAGNOSIS — M79675 Pain in left toe(s): Secondary | ICD-10-CM | POA: Diagnosis not present

## 2016-10-15 DIAGNOSIS — L03031 Cellulitis of right toe: Secondary | ICD-10-CM | POA: Diagnosis not present

## 2016-10-15 DIAGNOSIS — M79674 Pain in right toe(s): Secondary | ICD-10-CM | POA: Diagnosis not present

## 2016-10-15 DIAGNOSIS — B351 Tinea unguium: Secondary | ICD-10-CM | POA: Diagnosis not present

## 2016-10-18 DIAGNOSIS — G4733 Obstructive sleep apnea (adult) (pediatric): Secondary | ICD-10-CM | POA: Diagnosis not present

## 2016-10-18 DIAGNOSIS — G8929 Other chronic pain: Secondary | ICD-10-CM | POA: Diagnosis not present

## 2016-10-18 DIAGNOSIS — I120 Hypertensive chronic kidney disease with stage 5 chronic kidney disease or end stage renal disease: Secondary | ICD-10-CM | POA: Diagnosis not present

## 2016-10-18 DIAGNOSIS — E785 Hyperlipidemia, unspecified: Secondary | ICD-10-CM | POA: Diagnosis not present

## 2016-10-18 DIAGNOSIS — R21 Rash and other nonspecific skin eruption: Secondary | ICD-10-CM | POA: Diagnosis not present

## 2016-10-18 DIAGNOSIS — N186 End stage renal disease: Secondary | ICD-10-CM | POA: Diagnosis not present

## 2016-10-22 DIAGNOSIS — N186 End stage renal disease: Secondary | ICD-10-CM | POA: Diagnosis not present

## 2016-10-22 DIAGNOSIS — I120 Hypertensive chronic kidney disease with stage 5 chronic kidney disease or end stage renal disease: Secondary | ICD-10-CM | POA: Diagnosis not present

## 2016-10-22 DIAGNOSIS — G8929 Other chronic pain: Secondary | ICD-10-CM | POA: Diagnosis not present

## 2016-10-22 DIAGNOSIS — R21 Rash and other nonspecific skin eruption: Secondary | ICD-10-CM | POA: Diagnosis not present

## 2016-10-22 DIAGNOSIS — E785 Hyperlipidemia, unspecified: Secondary | ICD-10-CM | POA: Diagnosis not present

## 2016-10-22 DIAGNOSIS — G4733 Obstructive sleep apnea (adult) (pediatric): Secondary | ICD-10-CM | POA: Diagnosis not present

## 2016-10-25 DIAGNOSIS — G8929 Other chronic pain: Secondary | ICD-10-CM | POA: Diagnosis not present

## 2016-10-25 DIAGNOSIS — G4733 Obstructive sleep apnea (adult) (pediatric): Secondary | ICD-10-CM | POA: Diagnosis not present

## 2016-10-25 DIAGNOSIS — R21 Rash and other nonspecific skin eruption: Secondary | ICD-10-CM | POA: Diagnosis not present

## 2016-10-25 DIAGNOSIS — E785 Hyperlipidemia, unspecified: Secondary | ICD-10-CM | POA: Diagnosis not present

## 2016-10-25 DIAGNOSIS — I120 Hypertensive chronic kidney disease with stage 5 chronic kidney disease or end stage renal disease: Secondary | ICD-10-CM | POA: Diagnosis not present

## 2016-10-25 DIAGNOSIS — N186 End stage renal disease: Secondary | ICD-10-CM | POA: Diagnosis not present

## 2016-10-27 DIAGNOSIS — N186 End stage renal disease: Secondary | ICD-10-CM | POA: Diagnosis not present

## 2016-10-27 DIAGNOSIS — E785 Hyperlipidemia, unspecified: Secondary | ICD-10-CM | POA: Diagnosis not present

## 2016-10-27 DIAGNOSIS — G8929 Other chronic pain: Secondary | ICD-10-CM | POA: Diagnosis not present

## 2016-10-27 DIAGNOSIS — G4733 Obstructive sleep apnea (adult) (pediatric): Secondary | ICD-10-CM | POA: Diagnosis not present

## 2016-10-27 DIAGNOSIS — I120 Hypertensive chronic kidney disease with stage 5 chronic kidney disease or end stage renal disease: Secondary | ICD-10-CM | POA: Diagnosis not present

## 2016-10-27 DIAGNOSIS — R21 Rash and other nonspecific skin eruption: Secondary | ICD-10-CM | POA: Diagnosis not present

## 2016-11-01 DIAGNOSIS — E785 Hyperlipidemia, unspecified: Secondary | ICD-10-CM | POA: Diagnosis not present

## 2016-11-01 DIAGNOSIS — G4733 Obstructive sleep apnea (adult) (pediatric): Secondary | ICD-10-CM | POA: Diagnosis not present

## 2016-11-01 DIAGNOSIS — N186 End stage renal disease: Secondary | ICD-10-CM | POA: Diagnosis not present

## 2016-11-01 DIAGNOSIS — G8929 Other chronic pain: Secondary | ICD-10-CM | POA: Diagnosis not present

## 2016-11-01 DIAGNOSIS — I120 Hypertensive chronic kidney disease with stage 5 chronic kidney disease or end stage renal disease: Secondary | ICD-10-CM | POA: Diagnosis not present

## 2016-11-01 DIAGNOSIS — R21 Rash and other nonspecific skin eruption: Secondary | ICD-10-CM | POA: Diagnosis not present

## 2016-11-03 DIAGNOSIS — Z992 Dependence on renal dialysis: Secondary | ICD-10-CM | POA: Diagnosis not present

## 2016-11-03 DIAGNOSIS — I120 Hypertensive chronic kidney disease with stage 5 chronic kidney disease or end stage renal disease: Secondary | ICD-10-CM | POA: Diagnosis not present

## 2016-11-03 DIAGNOSIS — G8929 Other chronic pain: Secondary | ICD-10-CM | POA: Diagnosis not present

## 2016-11-03 DIAGNOSIS — E785 Hyperlipidemia, unspecified: Secondary | ICD-10-CM | POA: Diagnosis not present

## 2016-11-03 DIAGNOSIS — G4733 Obstructive sleep apnea (adult) (pediatric): Secondary | ICD-10-CM | POA: Diagnosis not present

## 2016-11-03 DIAGNOSIS — N186 End stage renal disease: Secondary | ICD-10-CM | POA: Diagnosis not present

## 2016-11-03 DIAGNOSIS — R21 Rash and other nonspecific skin eruption: Secondary | ICD-10-CM | POA: Diagnosis not present

## 2016-11-05 DIAGNOSIS — G8929 Other chronic pain: Secondary | ICD-10-CM | POA: Diagnosis not present

## 2016-11-05 DIAGNOSIS — E785 Hyperlipidemia, unspecified: Secondary | ICD-10-CM | POA: Diagnosis not present

## 2016-11-05 DIAGNOSIS — I120 Hypertensive chronic kidney disease with stage 5 chronic kidney disease or end stage renal disease: Secondary | ICD-10-CM | POA: Diagnosis not present

## 2016-11-05 DIAGNOSIS — N186 End stage renal disease: Secondary | ICD-10-CM | POA: Diagnosis not present

## 2016-11-05 DIAGNOSIS — G4733 Obstructive sleep apnea (adult) (pediatric): Secondary | ICD-10-CM | POA: Diagnosis not present

## 2016-11-05 DIAGNOSIS — R21 Rash and other nonspecific skin eruption: Secondary | ICD-10-CM | POA: Diagnosis not present

## 2016-11-09 DIAGNOSIS — N186 End stage renal disease: Secondary | ICD-10-CM | POA: Diagnosis not present

## 2016-11-09 DIAGNOSIS — Z992 Dependence on renal dialysis: Secondary | ICD-10-CM | POA: Diagnosis not present

## 2016-11-09 DIAGNOSIS — I120 Hypertensive chronic kidney disease with stage 5 chronic kidney disease or end stage renal disease: Secondary | ICD-10-CM | POA: Diagnosis not present

## 2016-11-09 DIAGNOSIS — E785 Hyperlipidemia, unspecified: Secondary | ICD-10-CM | POA: Diagnosis not present

## 2016-11-09 DIAGNOSIS — Z9181 History of falling: Secondary | ICD-10-CM | POA: Diagnosis not present

## 2016-11-09 DIAGNOSIS — G4733 Obstructive sleep apnea (adult) (pediatric): Secondary | ICD-10-CM | POA: Diagnosis not present

## 2016-11-09 DIAGNOSIS — R269 Unspecified abnormalities of gait and mobility: Secondary | ICD-10-CM | POA: Diagnosis not present

## 2016-11-09 DIAGNOSIS — G8929 Other chronic pain: Secondary | ICD-10-CM | POA: Diagnosis not present

## 2016-11-09 DIAGNOSIS — R531 Weakness: Secondary | ICD-10-CM | POA: Diagnosis not present

## 2016-11-10 DIAGNOSIS — I120 Hypertensive chronic kidney disease with stage 5 chronic kidney disease or end stage renal disease: Secondary | ICD-10-CM | POA: Diagnosis not present

## 2016-11-10 DIAGNOSIS — G4733 Obstructive sleep apnea (adult) (pediatric): Secondary | ICD-10-CM | POA: Diagnosis not present

## 2016-11-10 DIAGNOSIS — R269 Unspecified abnormalities of gait and mobility: Secondary | ICD-10-CM | POA: Diagnosis not present

## 2016-11-10 DIAGNOSIS — G8929 Other chronic pain: Secondary | ICD-10-CM | POA: Diagnosis not present

## 2016-11-10 DIAGNOSIS — R531 Weakness: Secondary | ICD-10-CM | POA: Diagnosis not present

## 2016-11-10 DIAGNOSIS — N186 End stage renal disease: Secondary | ICD-10-CM | POA: Diagnosis not present

## 2016-11-12 ENCOUNTER — Other Ambulatory Visit: Payer: Self-pay

## 2016-11-12 ENCOUNTER — Ambulatory Visit: Payer: Medicare Other

## 2016-11-12 ENCOUNTER — Ambulatory Visit
Admission: EM | Admit: 2016-11-12 | Discharge: 2016-11-12 | Disposition: A | Discharge status: Home / Self Care | Payer: Medicare Other | Attending: Family Medicine | Admitting: Family Medicine

## 2016-11-12 DIAGNOSIS — S5002XA Contusion of left elbow, initial encounter: Secondary | ICD-10-CM | POA: Diagnosis not present

## 2016-11-12 DIAGNOSIS — M4802 Spinal stenosis, cervical region: Secondary | ICD-10-CM | POA: Diagnosis not present

## 2016-11-12 DIAGNOSIS — W19XXXA Unspecified fall, initial encounter: Secondary | ICD-10-CM | POA: Insufficient documentation

## 2016-11-12 DIAGNOSIS — Z809 Family history of malignant neoplasm, unspecified: Secondary | ICD-10-CM | POA: Insufficient documentation

## 2016-11-12 DIAGNOSIS — Z79891 Long term (current) use of opiate analgesic: Secondary | ICD-10-CM | POA: Diagnosis not present

## 2016-11-12 DIAGNOSIS — Z880 Allergy status to penicillin: Secondary | ICD-10-CM | POA: Diagnosis not present

## 2016-11-12 DIAGNOSIS — M4320 Fusion of spine, site unspecified: Secondary | ICD-10-CM | POA: Diagnosis not present

## 2016-11-12 DIAGNOSIS — Z825 Family history of asthma and other chronic lower respiratory diseases: Secondary | ICD-10-CM | POA: Diagnosis not present

## 2016-11-12 DIAGNOSIS — Z992 Dependence on renal dialysis: Secondary | ICD-10-CM | POA: Diagnosis not present

## 2016-11-12 DIAGNOSIS — Z8249 Family history of ischemic heart disease and other diseases of the circulatory system: Secondary | ICD-10-CM | POA: Diagnosis not present

## 2016-11-12 DIAGNOSIS — S6992XA Unspecified injury of left wrist, hand and finger(s), initial encounter: Secondary | ICD-10-CM | POA: Diagnosis not present

## 2016-11-12 DIAGNOSIS — M25522 Pain in left elbow: Secondary | ICD-10-CM

## 2016-11-12 DIAGNOSIS — Z888 Allergy status to other drugs, medicaments and biological substances status: Secondary | ICD-10-CM | POA: Insufficient documentation

## 2016-11-12 DIAGNOSIS — Z91048 Other nonmedicinal substance allergy status: Secondary | ICD-10-CM | POA: Diagnosis not present

## 2016-11-12 DIAGNOSIS — Z882 Allergy status to sulfonamides status: Secondary | ICD-10-CM | POA: Diagnosis not present

## 2016-11-12 DIAGNOSIS — E785 Hyperlipidemia, unspecified: Secondary | ICD-10-CM | POA: Diagnosis not present

## 2016-11-12 DIAGNOSIS — M25532 Pain in left wrist: Secondary | ICD-10-CM | POA: Insufficient documentation

## 2016-11-12 DIAGNOSIS — Z79899 Other long term (current) drug therapy: Secondary | ICD-10-CM | POA: Insufficient documentation

## 2016-11-12 DIAGNOSIS — Z820 Family history of epilepsy and other diseases of the nervous system: Secondary | ICD-10-CM | POA: Insufficient documentation

## 2016-11-12 NOTE — ED Provider Notes (Signed)
[facility]    CSN: [id number] Arrival date & time: 11/12/16  1135  History   Chief Complaint Chief Complaint  Patient presents with  . Fall  . Wrist Pain   HPI   61 year old male presents with the above complaints.  Patient states that on Sunday he suffered a fall.  He states that his feet got "tangled up" and he fell and hit a door jam.  He did not strike the floor.  He states that he hit his head and also his forearm/wrist.  He states that his pain is not improved and he is concerned.  He has significant bruising around his wrist and has a healing skin tear over his forearm.  Wife states that he has been neurologically intact and has been his normal self.   The pain of his wrist is worse with activity.  Improves with rest.  No other associated symptoms.  No other complaints or concerns at this time.  Pain currently 6/10 in severity.  Past Medical History:  Diagnosis Date  . Bullous dermatosis   . Dialysis patient ([place])   . Dysphagia   . FSGS (focal segmental glomerulosclerosis)   . Hyperlipidemia   . Spinal stenosis, cervical region    Past Surgical History:  Procedure Laterality Date  . SPINAL FUSION  2018    Home Medications    Prior to Admission medications   Medication Sig Start Date End Date Taking? Authorizing Provider  acetaminophen (TYLENOL) 325 MG tablet Take 650 mg by mouth every 8 (eight) hours as needed (pain or fever).    Yes [provider]  amLODipine (NORVASC) 5 MG tablet Take 5 mg by mouth daily. Takes only M-W-F-Sun   Yes [provider]  atorvastatin (LIPITOR) 40 MG tablet Take 20 mg by mouth daily.    Yes [provider]  b complex-vitamin c-folic acid (NEPHRO-VITE) 0.8 MG TABS tablet Take 1 tablet by mouth at bedtime.   Yes [provider]  betamethasone dipropionate (DIPROLENE) 0.05 % ointment Apply topically 2 (two) times daily.   Yes [provider]  bisacodyl (DULCOLAX) 10 MG suppository  Place 10 mg rectally daily as needed for moderate constipation.   Yes [provider]  calcium acetate (PHOSLO) 667 MG capsule Take 1,334 mg by mouth 3 (three) times daily with meals.    Yes [provider]  Carboxymethylcellul-Glycerin (REFRESH OPTIVE SENSITIVE OP) Place 1 drop into both eyes every 4 (four) hours.   Yes [provider]  carvedilol (COREG) 25 MG tablet Take 25 mg by mouth 2 (two) times daily with a meal.    Yes [provider]  cetirizine (ZYRTEC) 10 MG tablet Take 10 mg by mouth daily.   Yes [provider]  cinacalcet (SENSIPAR) 90 MG tablet Take 90 mg by mouth daily.   Yes [provider]  clobetasol ointment (TEMOVATE) 2.53 % Apply 1 application topically 2 (two) times daily.   Yes [provider]  cycloSPORINE (RESTASIS) 0.05 % ophthalmic emulsion Place 1 drop into both eyes 2 (two) times daily.   Yes [provider]  diphenhydrAMINE (BENADRYL) 25 mg capsule Take 25 mg by mouth 2 (two) times daily as needed.   Yes [provider]  docusate sodium (COLACE) 100 MG capsule Take 100 mg by mouth daily.   Yes [provider]  doxycycline (VIBRAMYCIN) 100 MG capsule Take 100 mg by mouth 2 (two) times daily.   Yes [provider]  gabapentin (NEURONTIN) 300  MG capsule Take 100 mg by mouth at bedtime.   Yes [provider]  hydrALAZINE (APRESOLINE) 10 MG tablet Take 10 mg by mouth every 4 (four) hours as needed (systolic BP >937JIRC).    Yes [provider]  lactulose (CHRONULAC) 10 GM/15ML solution Take by mouth 3 (three) times daily.   Yes [provider]  latanoprost (XALATAN) 0.005 % ophthalmic solution Place 1 drop into both eyes at bedtime.   Yes [provider]  methocarbamol (ROBAXIN) 500 MG tablet Take 500 mg by mouth 3 (three) times daily.    Yes [provider]  mupirocin cream (BACTROBAN) 2 % Apply 1 application topically 2 (two)  times daily. 09/30/16  Yes Frederich Cha, MD  naproxen (NAPROSYN) 500 MG tablet Take 500 mg by mouth 2 (two) times daily with a meal.   Yes [provider]  omeprazole (PRILOSEC) 20 MG capsule Take 20 mg by mouth 2 (two) times daily before a meal.   Yes [provider]  oxycodone (OXY-IR) 5 MG capsule Take 5-10 mg by mouth every 4 (four) hours as needed for pain.    Yes [provider]  polyvinyl alcohol (LIQUIFILM TEARS) 1.4 % ophthalmic solution Place 1 drop into both eyes 4 (four) times daily as needed for dry eyes.   Yes [provider]  Psyllium (KONSYL-D PO) Take 5 mLs by mouth 2 (two) times daily.   Yes [provider]  senna-docusate (SENOKOT-S) 8.6-50 MG tablet Take 1 tablet by mouth 2 (two) times daily as needed (to prevent constipation; scheduled until bowel movement).    Yes [provider]  sertraline (ZOLOFT) 50 MG tablet Take 150 mg by mouth at bedtime.   Yes [provider]  simethicone (MYLICON) 80 MG chewable tablet Chew 80 mg by mouth 3 (three) times daily.   Yes [provider]  timolol (BETIMOL) 0.5 % ophthalmic solution Place 1 drop into both eyes daily.   Yes [provider]  traZODone (DESYREL) 100 MG tablet Take 100 mg by mouth at bedtime.   Yes [provider]    Family History Family History  Problem Relation Age of Onset  . Cancer Mother   . COPD Mother   . Heart disease Father   . Parkinson's disease Father    Social History Social History   Tobacco Use  . Smoking status: Never Smoker  . Smokeless tobacco: Never Used  Substance Use Topics  . Alcohol use: No  . Drug use: No    Allergies   Albumin (human); Aloe; Iodinated diagnostic agents; Iron dextran; Metoprolol tartrate; Penicillins; Quinine sulfate [quinine]; Rabeprazole; Sulfa antibiotics; and Trimethoprim  Review of Systems Review of Systems  Constitutional: Negative.   Musculoskeletal:       Wrist pain.  Elbow pain.  Skin: Positive for wound.       Bruising.  Neurological: Negative.    Physical Exam Triage Vital Signs ED Triage Vitals  Enc Vitals Group     BP 11/12/16 1202 (!) 142/86     Pulse Rate 11/12/16 1202 81     Resp 11/12/16 1202 18     Temp 11/12/16 1202 98.4 F (36.9 C)     Temp Source 11/12/16 1202 Oral     SpO2 11/12/16 1202 100 %     Weight 11/12/16 1157 163 lb (73.9 kg)     Height 11/12/16 1157 5\' 10"  (1.778 m)     Head Circumference --      Peak Flow --  Pain Score 11/12/16 1157 6     Pain Loc --      Pain Edu? --      Excl. in [place]? --    Updated Vital Signs BP (!) 142/86 (BP Location: Left Arm)   Pulse 81   Temp 98.4 F (36.9 C) (Oral)   Resp 18   Ht 5\' 10"  (1.778 m)   Wt 163 lb (73.9 kg)   SpO2 100%   BMI 23.39 kg/m  Physical Exam  Constitutional: He is oriented to person, place, and time. He appears well-developed. No distress.  HENT:  Head: Normocephalic and atraumatic.  Nose: Nose normal.  Eyes: Conjunctivae are normal. No scleral icterus.  Cardiovascular:  Regular rate.  Irregular likely secondary to ectopy.  2/6 systolic murmur.  Pulmonary/Chest: Effort normal and breath sounds normal. He has no wheezes. He has no rales.  Musculoskeletal:  Left wrist -decreased range of motion secondary to pain.  Patient is exquisitely tender in the middle of the wrist as well as at the ulnar side.  No snuffbox tenderness.  Elbow -tenderness medially.  Decreased range of motion -patient cannot fully extend his elbow.  Likely has underlying osteoarthritis.  Neurological: He is alert and oriented to person, place, and time.  No apparent focal deficits.  Skin:  Bruising noted around the left wrist.  Healing skin tear noted on the ulnar side of the forearm.  Patient has scattered open wounds secondary to bullous pemphigoid.  Psychiatric: He has a normal mood and affect. His behavior is normal. Thought content normal.  Vitals reviewed.  UC Treatments /  Results  Labs (all labs ordered are listed, but only abnormal results are displayed) Labs Reviewed - No data to display  EKG  EKG Interpretation None       Radiology Dg Elbow Complete Left  Result Date: 11/12/2016 CLINICAL DATA:  Fall 5 days ago.  Bruising.  Dialysis patient EXAM: LEFT ELBOW - COMPLETE 3+ VIEW COMPARISON:  None. FINDINGS: Normal alignment. No fracture. Calcaneal spurring. Joint space normal. Calcified hematoma in the forearm, chronic. Arterial calcification and fistula calcification. IMPRESSION: Negative for fracture. Electronically Signed   By: Franchot Gallo M.D.   On: 11/12/2016 12:52   Dg Wrist Complete Left  Result Date: 11/12/2016 CLINICAL DATA:  Fall 5 days ago.  Dialysis patient EXAM: LEFT WRIST - COMPLETE 3+ VIEW COMPARISON:  None. FINDINGS: Normal alignment.  No fracture. Extensive vascular calcification related to dialysis fistula. Soft tissue calcification medially consistent with chronic hematoma. IMPRESSION: Negative for fracture Electronically Signed   By: Franchot Gallo M.D.   On: 11/12/2016 12:53    Procedures Procedures (including critical care time)  Medications Ordered in UC Medications - No data to display   Initial Impression / Assessment and Plan / UC Course  I have reviewed the triage vital signs and the nursing notes.  Pertinent labs & imaging results that were available during my care of the patient were reviewed by me and considered in my medical decision making (see chart for details).     61 year old male presents for evaluation after a fall.  Elbow and wrist x-rays performed and were negative.  Neurologically intact.  Advised rest, elevation, supportive care.  Final Clinical Impressions(s) / UC Diagnoses   Final diagnoses:  Left wrist pain  Fall, initial encounter    ED Discharge Orders    None     Controlled Substance Prescriptions [place] Controlled Substance Registry consulted? Not Applicable   Coral Spikes,  DO  11/12/16 1301  

## 2016-11-12 NOTE — Discharge Instructions (Signed)
No fracture.  Rest, elevation.  Take care  Dr. Lacinda Axon

## 2016-11-12 NOTE — ED Triage Notes (Signed)
Patient complains of a fall that occurred in his bathroom on Sunday morning. Patient states that he hit his head on a door jam as well as his left wrist. Patient complains of left wrist pain that radiates to his forearm.

## 2016-11-15 DIAGNOSIS — R269 Unspecified abnormalities of gait and mobility: Secondary | ICD-10-CM | POA: Diagnosis not present

## 2016-11-15 DIAGNOSIS — G4733 Obstructive sleep apnea (adult) (pediatric): Secondary | ICD-10-CM | POA: Diagnosis not present

## 2016-11-15 DIAGNOSIS — N186 End stage renal disease: Secondary | ICD-10-CM | POA: Diagnosis not present

## 2016-11-15 DIAGNOSIS — R531 Weakness: Secondary | ICD-10-CM | POA: Diagnosis not present

## 2016-11-15 DIAGNOSIS — G8929 Other chronic pain: Secondary | ICD-10-CM | POA: Diagnosis not present

## 2016-11-15 DIAGNOSIS — I120 Hypertensive chronic kidney disease with stage 5 chronic kidney disease or end stage renal disease: Secondary | ICD-10-CM | POA: Diagnosis not present

## 2016-11-17 DIAGNOSIS — G8929 Other chronic pain: Secondary | ICD-10-CM | POA: Diagnosis not present

## 2016-11-17 DIAGNOSIS — I120 Hypertensive chronic kidney disease with stage 5 chronic kidney disease or end stage renal disease: Secondary | ICD-10-CM | POA: Diagnosis not present

## 2016-11-17 DIAGNOSIS — G4733 Obstructive sleep apnea (adult) (pediatric): Secondary | ICD-10-CM | POA: Diagnosis not present

## 2016-11-17 DIAGNOSIS — R269 Unspecified abnormalities of gait and mobility: Secondary | ICD-10-CM | POA: Diagnosis not present

## 2016-11-17 DIAGNOSIS — R531 Weakness: Secondary | ICD-10-CM | POA: Diagnosis not present

## 2016-11-17 DIAGNOSIS — N186 End stage renal disease: Secondary | ICD-10-CM | POA: Diagnosis not present

## 2016-11-23 DIAGNOSIS — G4733 Obstructive sleep apnea (adult) (pediatric): Secondary | ICD-10-CM | POA: Diagnosis not present

## 2016-11-23 DIAGNOSIS — N186 End stage renal disease: Secondary | ICD-10-CM | POA: Diagnosis not present

## 2016-11-23 DIAGNOSIS — R531 Weakness: Secondary | ICD-10-CM | POA: Diagnosis not present

## 2016-11-23 DIAGNOSIS — I120 Hypertensive chronic kidney disease with stage 5 chronic kidney disease or end stage renal disease: Secondary | ICD-10-CM | POA: Diagnosis not present

## 2016-11-23 DIAGNOSIS — G8929 Other chronic pain: Secondary | ICD-10-CM | POA: Diagnosis not present

## 2016-11-23 DIAGNOSIS — R269 Unspecified abnormalities of gait and mobility: Secondary | ICD-10-CM | POA: Diagnosis not present

## 2016-11-25 DIAGNOSIS — R269 Unspecified abnormalities of gait and mobility: Secondary | ICD-10-CM | POA: Diagnosis not present

## 2016-11-25 DIAGNOSIS — I120 Hypertensive chronic kidney disease with stage 5 chronic kidney disease or end stage renal disease: Secondary | ICD-10-CM | POA: Diagnosis not present

## 2016-11-25 DIAGNOSIS — N186 End stage renal disease: Secondary | ICD-10-CM | POA: Diagnosis not present

## 2016-11-25 DIAGNOSIS — G8929 Other chronic pain: Secondary | ICD-10-CM | POA: Diagnosis not present

## 2016-11-25 DIAGNOSIS — R531 Weakness: Secondary | ICD-10-CM | POA: Diagnosis not present

## 2016-11-25 DIAGNOSIS — G4733 Obstructive sleep apnea (adult) (pediatric): Secondary | ICD-10-CM | POA: Diagnosis not present

## 2016-12-01 DIAGNOSIS — I120 Hypertensive chronic kidney disease with stage 5 chronic kidney disease or end stage renal disease: Secondary | ICD-10-CM | POA: Diagnosis not present

## 2016-12-01 DIAGNOSIS — R531 Weakness: Secondary | ICD-10-CM | POA: Diagnosis not present

## 2016-12-01 DIAGNOSIS — R269 Unspecified abnormalities of gait and mobility: Secondary | ICD-10-CM | POA: Diagnosis not present

## 2016-12-01 DIAGNOSIS — N186 End stage renal disease: Secondary | ICD-10-CM | POA: Diagnosis not present

## 2016-12-01 DIAGNOSIS — G8929 Other chronic pain: Secondary | ICD-10-CM | POA: Diagnosis not present

## 2016-12-01 DIAGNOSIS — G4733 Obstructive sleep apnea (adult) (pediatric): Secondary | ICD-10-CM | POA: Diagnosis not present

## 2016-12-03 DIAGNOSIS — N186 End stage renal disease: Secondary | ICD-10-CM | POA: Diagnosis not present

## 2016-12-03 DIAGNOSIS — G8929 Other chronic pain: Secondary | ICD-10-CM | POA: Diagnosis not present

## 2016-12-03 DIAGNOSIS — G4733 Obstructive sleep apnea (adult) (pediatric): Secondary | ICD-10-CM | POA: Diagnosis not present

## 2016-12-03 DIAGNOSIS — I120 Hypertensive chronic kidney disease with stage 5 chronic kidney disease or end stage renal disease: Secondary | ICD-10-CM | POA: Diagnosis not present

## 2016-12-03 DIAGNOSIS — Z992 Dependence on renal dialysis: Secondary | ICD-10-CM | POA: Diagnosis not present

## 2016-12-03 DIAGNOSIS — R531 Weakness: Secondary | ICD-10-CM | POA: Diagnosis not present

## 2016-12-03 DIAGNOSIS — R269 Unspecified abnormalities of gait and mobility: Secondary | ICD-10-CM | POA: Diagnosis not present

## 2016-12-06 DIAGNOSIS — G8929 Other chronic pain: Secondary | ICD-10-CM | POA: Diagnosis not present

## 2016-12-06 DIAGNOSIS — N186 End stage renal disease: Secondary | ICD-10-CM | POA: Diagnosis not present

## 2016-12-06 DIAGNOSIS — G4733 Obstructive sleep apnea (adult) (pediatric): Secondary | ICD-10-CM | POA: Diagnosis not present

## 2016-12-06 DIAGNOSIS — R269 Unspecified abnormalities of gait and mobility: Secondary | ICD-10-CM | POA: Diagnosis not present

## 2016-12-06 DIAGNOSIS — R531 Weakness: Secondary | ICD-10-CM | POA: Diagnosis not present

## 2016-12-06 DIAGNOSIS — I120 Hypertensive chronic kidney disease with stage 5 chronic kidney disease or end stage renal disease: Secondary | ICD-10-CM | POA: Diagnosis not present

## 2016-12-08 DIAGNOSIS — R531 Weakness: Secondary | ICD-10-CM | POA: Diagnosis not present

## 2016-12-08 DIAGNOSIS — R269 Unspecified abnormalities of gait and mobility: Secondary | ICD-10-CM | POA: Diagnosis not present

## 2016-12-08 DIAGNOSIS — N186 End stage renal disease: Secondary | ICD-10-CM | POA: Diagnosis not present

## 2016-12-08 DIAGNOSIS — G8929 Other chronic pain: Secondary | ICD-10-CM | POA: Diagnosis not present

## 2016-12-08 DIAGNOSIS — I120 Hypertensive chronic kidney disease with stage 5 chronic kidney disease or end stage renal disease: Secondary | ICD-10-CM | POA: Diagnosis not present

## 2016-12-08 DIAGNOSIS — G4733 Obstructive sleep apnea (adult) (pediatric): Secondary | ICD-10-CM | POA: Diagnosis not present

## 2016-12-10 DIAGNOSIS — I120 Hypertensive chronic kidney disease with stage 5 chronic kidney disease or end stage renal disease: Secondary | ICD-10-CM | POA: Diagnosis not present

## 2016-12-10 DIAGNOSIS — N186 End stage renal disease: Secondary | ICD-10-CM | POA: Diagnosis not present

## 2016-12-10 DIAGNOSIS — G4733 Obstructive sleep apnea (adult) (pediatric): Secondary | ICD-10-CM | POA: Diagnosis not present

## 2016-12-10 DIAGNOSIS — R269 Unspecified abnormalities of gait and mobility: Secondary | ICD-10-CM | POA: Diagnosis not present

## 2016-12-10 DIAGNOSIS — G8929 Other chronic pain: Secondary | ICD-10-CM | POA: Diagnosis not present

## 2016-12-10 DIAGNOSIS — R531 Weakness: Secondary | ICD-10-CM | POA: Diagnosis not present

## 2016-12-15 DIAGNOSIS — G8929 Other chronic pain: Secondary | ICD-10-CM | POA: Diagnosis not present

## 2016-12-15 DIAGNOSIS — R531 Weakness: Secondary | ICD-10-CM | POA: Diagnosis not present

## 2016-12-15 DIAGNOSIS — I120 Hypertensive chronic kidney disease with stage 5 chronic kidney disease or end stage renal disease: Secondary | ICD-10-CM | POA: Diagnosis not present

## 2016-12-15 DIAGNOSIS — G4733 Obstructive sleep apnea (adult) (pediatric): Secondary | ICD-10-CM | POA: Diagnosis not present

## 2016-12-15 DIAGNOSIS — R269 Unspecified abnormalities of gait and mobility: Secondary | ICD-10-CM | POA: Diagnosis not present

## 2016-12-15 DIAGNOSIS — N186 End stage renal disease: Secondary | ICD-10-CM | POA: Diagnosis not present

## 2016-12-16 ENCOUNTER — Encounter: Payer: Self-pay | Admitting: Emergency Medicine

## 2016-12-16 ENCOUNTER — Ambulatory Visit
Admission: EM | Admit: 2016-12-16 | Discharge: 2016-12-16 | Disposition: A | Discharge status: Home / Self Care | Payer: Medicare Other | Attending: Family Medicine | Admitting: Family Medicine

## 2016-12-16 ENCOUNTER — Ambulatory Visit: Payer: Medicare Other

## 2016-12-16 ENCOUNTER — Other Ambulatory Visit: Payer: Self-pay

## 2016-12-16 DIAGNOSIS — Z79899 Other long term (current) drug therapy: Secondary | ICD-10-CM | POA: Insufficient documentation

## 2016-12-16 DIAGNOSIS — S5002XA Contusion of left elbow, initial encounter: Secondary | ICD-10-CM | POA: Diagnosis not present

## 2016-12-16 DIAGNOSIS — S5012XA Contusion of left forearm, initial encounter: Secondary | ICD-10-CM | POA: Insufficient documentation

## 2016-12-16 DIAGNOSIS — M7989 Other specified soft tissue disorders: Secondary | ICD-10-CM | POA: Diagnosis not present

## 2016-12-16 DIAGNOSIS — S299XXA Unspecified injury of thorax, initial encounter: Secondary | ICD-10-CM | POA: Diagnosis not present

## 2016-12-16 DIAGNOSIS — S20212A Contusion of left front wall of thorax, initial encounter: Secondary | ICD-10-CM | POA: Insufficient documentation

## 2016-12-16 DIAGNOSIS — X58XXXA Exposure to other specified factors, initial encounter: Secondary | ICD-10-CM | POA: Insufficient documentation

## 2016-12-16 DIAGNOSIS — S51012A Laceration without foreign body of left elbow, initial encounter: Secondary | ICD-10-CM | POA: Insufficient documentation

## 2016-12-16 DIAGNOSIS — Z880 Allergy status to penicillin: Secondary | ICD-10-CM | POA: Diagnosis not present

## 2016-12-16 DIAGNOSIS — R0781 Pleurodynia: Secondary | ICD-10-CM | POA: Insufficient documentation

## 2016-12-16 DIAGNOSIS — S59902A Unspecified injury of left elbow, initial encounter: Secondary | ICD-10-CM | POA: Diagnosis not present

## 2016-12-16 DIAGNOSIS — W19XXXA Unspecified fall, initial encounter: Secondary | ICD-10-CM

## 2016-12-16 DIAGNOSIS — M25522 Pain in left elbow: Secondary | ICD-10-CM | POA: Diagnosis not present

## 2016-12-16 MED ORDER — MUPIROCIN 2 % EX OINT: 1.0000 "application " | TOPICAL_OINTMENT | Freq: Three times a day (TID) | CUTANEOUS | 0 refills | Status: DC

## 2016-12-16 NOTE — ED Triage Notes (Signed)
Patient in today c/o left sided rib pain and left elbow contusion after falling a dialysis on 12/14/16.

## 2016-12-16 NOTE — Discharge Instructions (Signed)
Apply bactroban ointment 3 times daily to the skin tear.

## 2016-12-16 NOTE — ED Provider Notes (Signed)
[facility]    CSN: [id number] Arrival date & time: 12/16/16  0962     History   Chief Complaint Chief Complaint  Patient presents with  . Fall    12/14/16    HPI William Carpenter is a 61 y.o. male.   HPI  Is a 61 year old male who presents today accompanied by his daughter when he left-sided rib pain and left elbow contusion after falling in dialysis on 12/14/2016.  She usually wears bedroom slippers but had to wear tennis shoes due to the snow that has recently occurred.  He is not used to wearing such thick soled shoes and when he stood up from having his dialysis and turning to use his rollabout, his foot became entangled and he fell landing on the floor against his left side.  He had no loss of consciousness.  Did not strike his head.  At time he has had pain in his left elbow skin tear along the ulna proximally.  He is also having pain in the left ribs lower 10 through 12 a more posterior in the posterior axillary line.  No hemoptysis or shortness of breath.  Pentair has been treated with IV dry bandage and co-band.  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 Bullous dermatosis   . Dialysis patient ([place])   . Dysphagia   . FSGS (focal segmental glomerulosclerosis)   . Hyperlipidemia   . Spinal stenosis, cervical region     Patient Active Problem List   Diagnosis Date Noted  . Acalculous cholecystitis   . Leukocytosis 07/12/2016  . Hypotension 07/12/2016    Past Surgical History:  Procedure Laterality Date  . CARPAL TUNNEL RELEASE    . SPINAL FUSION  2018  . spleen  2015   removed       Home Medications    Prior to Admission medications   Medication Sig Start Date End Date Taking? Authorizing Provider  acetaminophen (TYLENOL) 325 MG tablet Take 650 mg by mouth every 8 (eight) hours as needed (pain or fever).    Yes  [provider]  amLODipine (NORVASC) 5 MG tablet Take 5 mg by mouth daily. Takes only M-W-F-Sun   Yes [provider]  atorvastatin (LIPITOR) 40 MG tablet Take 20 mg by mouth daily.    Yes [provider]  b complex-vitamin c-folic acid (NEPHRO-VITE) 0.8 MG TABS tablet Take 1 tablet by mouth at bedtime.   Yes [provider]  betamethasone dipropionate (DIPROLENE) 0.05 % ointment Apply topically 2 (two) times daily.   Yes [provider]  bisacodyl (DULCOLAX) 10 MG suppository Place 10 mg rectally daily as needed for moderate constipation.   Yes [provider]  calcium acetate (PHOSLO) 667 MG capsule Take 1,334 mg by mouth 3 (three) times daily with meals.    Yes [provider]  Carboxymethylcellul-Glycerin (REFRESH OPTIVE SENSITIVE OP) Place 1 drop into both eyes every 4 (four) hours.   Yes [provider]  carvedilol (COREG) 25 MG tablet Take 25 mg by mouth 2 (two) times daily with a meal.    Yes [provider]  cetirizine (ZYRTEC) 10 MG tablet Take 10 mg by mouth daily.   Yes [provider]  cinacalcet (SENSIPAR) 90 MG tablet Take 90 mg by mouth daily.   Yes [provider]  clobetasol ointment (TEMOVATE) 0.05 % Apply 1  application topically 2 (two) times daily.   Yes [provider]  cycloSPORINE (RESTASIS) 0.05 % ophthalmic emulsion Place 1 drop into both eyes 2 (two) times daily.   Yes [provider]  diphenhydrAMINE (BENADRYL) 25 mg capsule Take 25 mg by mouth 2 (two) times daily as needed.   Yes [provider]  docusate sodium (COLACE) 100 MG capsule Take 100 mg by mouth daily.   Yes [provider]  doxycycline (VIBRAMYCIN) 100 MG capsule Take 100 mg by mouth 2 (two) times daily.   Yes [provider]  gabapentin (NEURONTIN) 300 MG capsule Take 100 mg by mouth at bedtime.   Yes [provider]  hydrALAZINE (APRESOLINE) 10 MG tablet  Take 10 mg by mouth every 4 (four) hours as needed (systolic BP >295JOAC).    Yes [provider]  lactulose (CHRONULAC) 10 GM/15ML solution Take by mouth 3 (three) times daily.   Yes [provider]  latanoprost (XALATAN) 0.005 % ophthalmic solution Place 1 drop into both eyes at bedtime.   Yes [provider]  methocarbamol (ROBAXIN) 500 MG tablet Take 500 mg by mouth 3 (three) times daily.    Yes [provider]  mupirocin cream (BACTROBAN) 2 % Apply 1 application topically 2 (two) times daily. 09/30/16  Yes Frederich Cha, MD  naproxen (NAPROSYN) 500 MG tablet Take 500 mg by mouth 2 (two) times daily with a meal.   Yes [provider]  omeprazole (PRILOSEC) 20 MG capsule Take 20 mg by mouth 2 (two) times daily before a meal.   Yes [provider]  oxycodone (OXY-IR) 5 MG capsule Take 5-10 mg by mouth every 4 (four) hours as needed for pain.    Yes [provider]  polyvinyl alcohol (LIQUIFILM TEARS) 1.4 % ophthalmic solution Place 1 drop into both eyes 4 (four) times daily as needed for dry eyes.   Yes [provider]  Psyllium (KONSYL-D PO) Take 5 mLs by mouth 2 (two) times daily.   Yes [provider]  senna-docusate (SENOKOT-S) 8.6-50 MG tablet Take 1 tablet by mouth 2 (two) times daily as needed (to prevent constipation; scheduled until bowel movement).    Yes [provider]  sertraline (ZOLOFT) 50 MG tablet Take 150 mg by mouth at bedtime.   Yes [provider]  simethicone (MYLICON) 80 MG chewable tablet Chew 80 mg by mouth 3 (three) times daily.   Yes [provider]  timolol (BETIMOL) 0.5 % ophthalmic solution Place 1 drop into both eyes daily.   Yes [provider]  traZODone (DESYREL) 100 MG tablet Take 100 mg by mouth at bedtime.   Yes [provider]  mupirocin ointment (BACTROBAN) 2 % Apply 1 application topically 3 (three) times daily. 12/16/16   Lorin Picket, PA-C    Family History Family History  Problem Relation Age of Onset  . Cancer Mother   . COPD Mother   . Heart disease Father   . Parkinson's disease Father     Social History Social History   Tobacco Use  . Smoking status: Never Smoker  . Smokeless tobacco: Never Used  Substance Use Topics  . Alcohol use: No  . Drug use: No     Allergies   Albumin (human); Aloe; Iodinated diagnostic agents; Iron dextran; Metoprolol tartrate; Penicillins; Quinine sulfate [quinine]; Rabeprazole; Sulfa antibiotics; and Trimethoprim   Review of Systems Review of Systems  Constitutional: Positive for activity change. Negative for chills, fatigue and  fever.  Musculoskeletal: Positive for arthralgias and myalgias.  Skin: Positive for color change and wound.  All other systems reviewed and are negative.    Physical Exam Triage Vital Signs ED Triage Vitals  Enc Vitals Group     BP 12/16/16 0851 113/77     Pulse Rate 12/16/16 0851 72     Resp 12/16/16 0851 16     Temp 12/16/16 0851 98.2 F (36.8 C)     Temp Source 12/16/16 0851 Oral     SpO2 12/16/16 0851 97 %     Weight 12/16/16 0851 162 lb (73.5 kg)     Height 12/16/16 0851 5\' 10"  (1.778 m)     Head Circumference --      Peak Flow --      Pain Score 12/16/16 0852 4     Pain Loc --      Pain Edu? --      Excl. in [place]? --    No data found.  Updated Vital Signs BP 113/77 (BP Location: Right Arm)   Pulse 72   Temp 98.2 F (36.8 C) (Oral)   Resp 16   Ht 5\' 10"  (1.778 m)   Wt 162 lb (73.5 kg)   SpO2 97%   BMI 23.24 kg/m   Visual Acuity Right Eye Distance:   Left Eye Distance:   Bilateral Distance:    Right Eye Near:   Left Eye Near:    Bilateral Near:     Physical Exam  Constitutional: He is oriented to person, place, and time. He appears well-developed and well-nourished. No distress.  HENT:  Head: Normocephalic.  Eyes: Pupils are equal, round, and reactive to light.  Neck: Normal range of motion.    Pulmonary/Chest: Effort normal and breath sounds normal.  Musculoskeletal: He exhibits tenderness.  Neurological: He is alert and oriented to person, place, and time.  Skin: Skin is warm and dry. He is not diaphoretic.  Arm fistula has a very good thrill.  There is a skin tear along the ulna proximally clean oozing area of serosanguineous liquid.  A small amount of drainage present.  It was covered with a Telfa pad and Coban.  There is no deformity seen of the elbow.  Examination of the chest that shows no contusions.  There is no deformities present.  Daughter was concerned over the prominence of the xiphoid process but there does not appear to be any injury to this area.  Psychiatric: He has a normal mood and affect. His behavior is normal. Judgment and thought content normal.  Nursing note and vitals reviewed.    UC Treatments / Results  Labs (all labs ordered are listed, but only abnormal results are displayed) Labs Reviewed - No data to display  EKG  EKG Interpretation None       Radiology Dg Ribs Unilateral W/chest Left  Result Date: 12/16/2016 CLINICAL DATA:  Pain following fall EXAM: LEFT RIBS AND CHEST - 3+ VIEW COMPARISON:  Chest radiograph July 26, 2016 and chest, abdomen, and pelvis CT July 26, 2016 FINDINGS: Frontal chest as well as oblique and cone-down rib images obtained. There is no edema or consolidation. The heart size and pulmonary vascularity are normal. No adenopathy. There is aortic atherosclerosis. There is postoperative change in the lower cervical spine. Bones appear osteoporotic. No rib fracture evident. No pneumothorax or pleural effusion. There are surgical clips in the upper abdomen as well as multiple foci of vascular calcification. Calcification in the left pelvis likely represents  calcification and failed transplant kidney. This finding was also present on prior CT. IMPRESSION: No demonstrable fracture. Bones are osteoporotic. No edema or consolidation.  Electronically Signed   By: Lowella Grip III M.D.   On: 12/16/2016 10:27   Dg Elbow Complete Left  Result Date: 12/16/2016 CLINICAL DATA:  Pain following fall. Chronic renal failure on dialysis EXAM: LEFT ELBOW - COMPLETE 3+ VIEW COMPARISON:  November 12, 2016. FINDINGS: Frontal, lateral, and bilateral oblique views were obtained. Bones are diffusely osteoporotic. No fracture or dislocation is evident. No joint effusion. There is a prominent spur arising from the olecranon process of the proximal ulna. There is generalized soft tissue swelling. There is extensive vascular calcification. There is calcification in the soft tissues of the left forearm proximally, likely calcification in old dialysis fistula. IMPRESSION: Soft tissue swelling. Chronic calcification in a prior dialysis fistula in the volar proximal forearm region. Multiple foci of atherosclerotic calcification in the radius and ulnar arteries. Bones are osteoporotic. No fracture or dislocation evident. No appreciable joint space narrowing. There is a spur arising from the olecranon region of the proximal ulna, stable. Electronically Signed   By: Lowella Grip III M.D.   On: 12/16/2016 10:22    Procedures Procedures (including critical care time)  Medications Ordered in UC Medications - No data to display   Initial Impression / Assessment and Plan / UC Course  I have reviewed the triage vital signs and the nursing notes.  Pertinent labs & imaging results that were available during my care of the patient were reviewed by me and considered in my medical decision making (see chart for details).     Plan: 1. Test/x-ray results and diagnosis reviewed with patient 2. rx as per orders; risks, benefits, potential side effects reviewed with patient 3. Recommend supportive treatment with coughing and deep breathing hourly to prevent pneumonitis.  Use Bactroban ointment to the skin tear until healed.  Recommend following up with  primary care physician if not improving. 4. F/u prn if symptoms worsen or don't improve   Final Clinical Impressions(s) / UC Diagnoses   Final diagnoses:  Fall, initial encounter  Contusion of ribs, left, initial encounter  Contusion of left elbow and forearm, initial encounter  Skin tear of elbow without complication, left, initial encounter    ED Discharge Orders        Ordered    mupirocin ointment (BACTROBAN) 2 %  3 times daily     12/16/16 1048       Controlled Substance Prescriptions [place] Controlled Substance Registry consulted? Not Applicable   Lorin Picket, PA-C 12/16/16 1824

## 2016-12-17 DIAGNOSIS — G8929 Other chronic pain: Secondary | ICD-10-CM | POA: Diagnosis not present

## 2016-12-17 DIAGNOSIS — N186 End stage renal disease: Secondary | ICD-10-CM | POA: Diagnosis not present

## 2016-12-17 DIAGNOSIS — G4733 Obstructive sleep apnea (adult) (pediatric): Secondary | ICD-10-CM | POA: Diagnosis not present

## 2016-12-17 DIAGNOSIS — I120 Hypertensive chronic kidney disease with stage 5 chronic kidney disease or end stage renal disease: Secondary | ICD-10-CM | POA: Diagnosis not present

## 2016-12-17 DIAGNOSIS — R269 Unspecified abnormalities of gait and mobility: Secondary | ICD-10-CM | POA: Diagnosis not present

## 2016-12-17 DIAGNOSIS — R531 Weakness: Secondary | ICD-10-CM | POA: Diagnosis not present

## 2016-12-20 DIAGNOSIS — R269 Unspecified abnormalities of gait and mobility: Secondary | ICD-10-CM | POA: Diagnosis not present

## 2016-12-20 DIAGNOSIS — I120 Hypertensive chronic kidney disease with stage 5 chronic kidney disease or end stage renal disease: Secondary | ICD-10-CM | POA: Diagnosis not present

## 2016-12-20 DIAGNOSIS — N186 End stage renal disease: Secondary | ICD-10-CM | POA: Diagnosis not present

## 2016-12-20 DIAGNOSIS — G8929 Other chronic pain: Secondary | ICD-10-CM | POA: Diagnosis not present

## 2016-12-20 DIAGNOSIS — G4733 Obstructive sleep apnea (adult) (pediatric): Secondary | ICD-10-CM | POA: Diagnosis not present

## 2016-12-20 DIAGNOSIS — R531 Weakness: Secondary | ICD-10-CM | POA: Diagnosis not present

## 2016-12-22 DIAGNOSIS — G4733 Obstructive sleep apnea (adult) (pediatric): Secondary | ICD-10-CM | POA: Diagnosis not present

## 2016-12-22 DIAGNOSIS — I120 Hypertensive chronic kidney disease with stage 5 chronic kidney disease or end stage renal disease: Secondary | ICD-10-CM | POA: Diagnosis not present

## 2016-12-22 DIAGNOSIS — N186 End stage renal disease: Secondary | ICD-10-CM | POA: Diagnosis not present

## 2016-12-22 DIAGNOSIS — G8929 Other chronic pain: Secondary | ICD-10-CM | POA: Diagnosis not present

## 2016-12-22 DIAGNOSIS — R531 Weakness: Secondary | ICD-10-CM | POA: Diagnosis not present

## 2016-12-22 DIAGNOSIS — R269 Unspecified abnormalities of gait and mobility: Secondary | ICD-10-CM | POA: Diagnosis not present

## 2016-12-24 DIAGNOSIS — R531 Weakness: Secondary | ICD-10-CM | POA: Diagnosis not present

## 2016-12-24 DIAGNOSIS — G8929 Other chronic pain: Secondary | ICD-10-CM | POA: Diagnosis not present

## 2016-12-24 DIAGNOSIS — G4733 Obstructive sleep apnea (adult) (pediatric): Secondary | ICD-10-CM | POA: Diagnosis not present

## 2016-12-24 DIAGNOSIS — R269 Unspecified abnormalities of gait and mobility: Secondary | ICD-10-CM | POA: Diagnosis not present

## 2016-12-24 DIAGNOSIS — N186 End stage renal disease: Secondary | ICD-10-CM | POA: Diagnosis not present

## 2016-12-24 DIAGNOSIS — I120 Hypertensive chronic kidney disease with stage 5 chronic kidney disease or end stage renal disease: Secondary | ICD-10-CM | POA: Diagnosis not present

## 2016-12-29 DIAGNOSIS — I120 Hypertensive chronic kidney disease with stage 5 chronic kidney disease or end stage renal disease: Secondary | ICD-10-CM | POA: Diagnosis not present

## 2016-12-29 DIAGNOSIS — R531 Weakness: Secondary | ICD-10-CM | POA: Diagnosis not present

## 2016-12-29 DIAGNOSIS — G4733 Obstructive sleep apnea (adult) (pediatric): Secondary | ICD-10-CM | POA: Diagnosis not present

## 2016-12-29 DIAGNOSIS — N186 End stage renal disease: Secondary | ICD-10-CM | POA: Diagnosis not present

## 2016-12-29 DIAGNOSIS — G8929 Other chronic pain: Secondary | ICD-10-CM | POA: Diagnosis not present

## 2016-12-29 DIAGNOSIS — R269 Unspecified abnormalities of gait and mobility: Secondary | ICD-10-CM | POA: Diagnosis not present

## 2016-12-31 DIAGNOSIS — N186 End stage renal disease: Secondary | ICD-10-CM | POA: Diagnosis not present

## 2016-12-31 DIAGNOSIS — G4733 Obstructive sleep apnea (adult) (pediatric): Secondary | ICD-10-CM | POA: Diagnosis not present

## 2016-12-31 DIAGNOSIS — G8929 Other chronic pain: Secondary | ICD-10-CM | POA: Diagnosis not present

## 2016-12-31 DIAGNOSIS — R531 Weakness: Secondary | ICD-10-CM | POA: Diagnosis not present

## 2016-12-31 DIAGNOSIS — I120 Hypertensive chronic kidney disease with stage 5 chronic kidney disease or end stage renal disease: Secondary | ICD-10-CM | POA: Diagnosis not present

## 2016-12-31 DIAGNOSIS — R269 Unspecified abnormalities of gait and mobility: Secondary | ICD-10-CM | POA: Diagnosis not present

## 2017-01-03 DIAGNOSIS — Z992 Dependence on renal dialysis: Secondary | ICD-10-CM | POA: Diagnosis not present

## 2017-01-03 DIAGNOSIS — N186 End stage renal disease: Secondary | ICD-10-CM | POA: Diagnosis not present

## 2017-01-26 DIAGNOSIS — M48062 Spinal stenosis, lumbar region with neurogenic claudication: Secondary | ICD-10-CM | POA: Diagnosis not present

## 2017-01-26 DIAGNOSIS — K219 Gastro-esophageal reflux disease without esophagitis: Secondary | ICD-10-CM | POA: Diagnosis not present

## 2017-01-26 DIAGNOSIS — G4733 Obstructive sleep apnea (adult) (pediatric): Secondary | ICD-10-CM | POA: Diagnosis not present

## 2017-01-26 DIAGNOSIS — F4322 Adjustment disorder with anxiety: Secondary | ICD-10-CM | POA: Diagnosis not present

## 2017-01-26 DIAGNOSIS — Z85528 Personal history of other malignant neoplasm of kidney: Secondary | ICD-10-CM | POA: Diagnosis not present

## 2017-01-26 DIAGNOSIS — M25552 Pain in left hip: Secondary | ICD-10-CM | POA: Diagnosis not present

## 2017-01-26 DIAGNOSIS — N186 End stage renal disease: Secondary | ICD-10-CM | POA: Diagnosis not present

## 2017-01-26 DIAGNOSIS — I120 Hypertensive chronic kidney disease with stage 5 chronic kidney disease or end stage renal disease: Secondary | ICD-10-CM | POA: Diagnosis not present

## 2017-01-26 DIAGNOSIS — Z8673 Personal history of transient ischemic attack (TIA), and cerebral infarction without residual deficits: Secondary | ICD-10-CM | POA: Diagnosis not present

## 2017-01-26 DIAGNOSIS — F329 Major depressive disorder, single episode, unspecified: Secondary | ICD-10-CM | POA: Diagnosis not present

## 2017-01-26 DIAGNOSIS — M5030 Other cervical disc degeneration, unspecified cervical region: Secondary | ICD-10-CM | POA: Diagnosis not present

## 2017-01-26 DIAGNOSIS — Z992 Dependence on renal dialysis: Secondary | ICD-10-CM | POA: Diagnosis not present

## 2017-01-26 DIAGNOSIS — M8580 Other specified disorders of bone density and structure, unspecified site: Secondary | ICD-10-CM | POA: Diagnosis not present

## 2017-01-26 DIAGNOSIS — F410 Panic disorder [episodic paroxysmal anxiety] without agoraphobia: Secondary | ICD-10-CM | POA: Diagnosis not present

## 2017-01-26 DIAGNOSIS — K589 Irritable bowel syndrome without diarrhea: Secondary | ICD-10-CM | POA: Diagnosis not present

## 2017-01-26 DIAGNOSIS — M4712 Other spondylosis with myelopathy, cervical region: Secondary | ICD-10-CM | POA: Diagnosis not present

## 2017-01-28 DIAGNOSIS — M25552 Pain in left hip: Secondary | ICD-10-CM | POA: Diagnosis not present

## 2017-01-28 DIAGNOSIS — M48062 Spinal stenosis, lumbar region with neurogenic claudication: Secondary | ICD-10-CM | POA: Diagnosis not present

## 2017-01-28 DIAGNOSIS — N186 End stage renal disease: Secondary | ICD-10-CM | POA: Diagnosis not present

## 2017-01-28 DIAGNOSIS — M5030 Other cervical disc degeneration, unspecified cervical region: Secondary | ICD-10-CM | POA: Diagnosis not present

## 2017-01-28 DIAGNOSIS — I120 Hypertensive chronic kidney disease with stage 5 chronic kidney disease or end stage renal disease: Secondary | ICD-10-CM | POA: Diagnosis not present

## 2017-01-28 DIAGNOSIS — M4712 Other spondylosis with myelopathy, cervical region: Secondary | ICD-10-CM | POA: Diagnosis not present

## 2017-02-01 DIAGNOSIS — M4712 Other spondylosis with myelopathy, cervical region: Secondary | ICD-10-CM | POA: Diagnosis not present

## 2017-02-01 DIAGNOSIS — N186 End stage renal disease: Secondary | ICD-10-CM | POA: Diagnosis not present

## 2017-02-01 DIAGNOSIS — M25552 Pain in left hip: Secondary | ICD-10-CM | POA: Diagnosis not present

## 2017-02-01 DIAGNOSIS — M5030 Other cervical disc degeneration, unspecified cervical region: Secondary | ICD-10-CM | POA: Diagnosis not present

## 2017-02-01 DIAGNOSIS — M48062 Spinal stenosis, lumbar region with neurogenic claudication: Secondary | ICD-10-CM | POA: Diagnosis not present

## 2017-02-01 DIAGNOSIS — I120 Hypertensive chronic kidney disease with stage 5 chronic kidney disease or end stage renal disease: Secondary | ICD-10-CM | POA: Diagnosis not present

## 2017-02-03 DIAGNOSIS — M48062 Spinal stenosis, lumbar region with neurogenic claudication: Secondary | ICD-10-CM | POA: Diagnosis not present

## 2017-02-03 DIAGNOSIS — M4712 Other spondylosis with myelopathy, cervical region: Secondary | ICD-10-CM | POA: Diagnosis not present

## 2017-02-03 DIAGNOSIS — M5030 Other cervical disc degeneration, unspecified cervical region: Secondary | ICD-10-CM | POA: Diagnosis not present

## 2017-02-03 DIAGNOSIS — N186 End stage renal disease: Secondary | ICD-10-CM | POA: Diagnosis not present

## 2017-02-03 DIAGNOSIS — M25552 Pain in left hip: Secondary | ICD-10-CM | POA: Diagnosis not present

## 2017-02-03 DIAGNOSIS — Z992 Dependence on renal dialysis: Secondary | ICD-10-CM | POA: Diagnosis not present

## 2017-02-03 DIAGNOSIS — I120 Hypertensive chronic kidney disease with stage 5 chronic kidney disease or end stage renal disease: Secondary | ICD-10-CM | POA: Diagnosis not present

## 2017-02-07 DIAGNOSIS — N186 End stage renal disease: Secondary | ICD-10-CM | POA: Diagnosis not present

## 2017-02-07 DIAGNOSIS — M48062 Spinal stenosis, lumbar region with neurogenic claudication: Secondary | ICD-10-CM | POA: Diagnosis not present

## 2017-02-07 DIAGNOSIS — I120 Hypertensive chronic kidney disease with stage 5 chronic kidney disease or end stage renal disease: Secondary | ICD-10-CM | POA: Diagnosis not present

## 2017-02-07 DIAGNOSIS — M5030 Other cervical disc degeneration, unspecified cervical region: Secondary | ICD-10-CM | POA: Diagnosis not present

## 2017-02-07 DIAGNOSIS — M25552 Pain in left hip: Secondary | ICD-10-CM | POA: Diagnosis not present

## 2017-02-07 DIAGNOSIS — M4712 Other spondylosis with myelopathy, cervical region: Secondary | ICD-10-CM | POA: Diagnosis not present

## 2017-02-10 DIAGNOSIS — N186 End stage renal disease: Secondary | ICD-10-CM | POA: Diagnosis not present

## 2017-02-10 DIAGNOSIS — I120 Hypertensive chronic kidney disease with stage 5 chronic kidney disease or end stage renal disease: Secondary | ICD-10-CM | POA: Diagnosis not present

## 2017-02-10 DIAGNOSIS — M5030 Other cervical disc degeneration, unspecified cervical region: Secondary | ICD-10-CM | POA: Diagnosis not present

## 2017-02-10 DIAGNOSIS — M4712 Other spondylosis with myelopathy, cervical region: Secondary | ICD-10-CM | POA: Diagnosis not present

## 2017-02-10 DIAGNOSIS — M48062 Spinal stenosis, lumbar region with neurogenic claudication: Secondary | ICD-10-CM | POA: Diagnosis not present

## 2017-02-10 DIAGNOSIS — M25552 Pain in left hip: Secondary | ICD-10-CM | POA: Diagnosis not present

## 2017-02-12 DIAGNOSIS — M5030 Other cervical disc degeneration, unspecified cervical region: Secondary | ICD-10-CM | POA: Diagnosis not present

## 2017-02-12 DIAGNOSIS — M25552 Pain in left hip: Secondary | ICD-10-CM | POA: Diagnosis not present

## 2017-02-12 DIAGNOSIS — M48062 Spinal stenosis, lumbar region with neurogenic claudication: Secondary | ICD-10-CM | POA: Diagnosis not present

## 2017-02-12 DIAGNOSIS — I120 Hypertensive chronic kidney disease with stage 5 chronic kidney disease or end stage renal disease: Secondary | ICD-10-CM | POA: Diagnosis not present

## 2017-02-12 DIAGNOSIS — N186 End stage renal disease: Secondary | ICD-10-CM | POA: Diagnosis not present

## 2017-02-12 DIAGNOSIS — M4712 Other spondylosis with myelopathy, cervical region: Secondary | ICD-10-CM | POA: Diagnosis not present

## 2017-02-14 DIAGNOSIS — M48062 Spinal stenosis, lumbar region with neurogenic claudication: Secondary | ICD-10-CM | POA: Diagnosis not present

## 2017-02-14 DIAGNOSIS — N186 End stage renal disease: Secondary | ICD-10-CM | POA: Diagnosis not present

## 2017-02-14 DIAGNOSIS — M5030 Other cervical disc degeneration, unspecified cervical region: Secondary | ICD-10-CM | POA: Diagnosis not present

## 2017-02-14 DIAGNOSIS — M4712 Other spondylosis with myelopathy, cervical region: Secondary | ICD-10-CM | POA: Diagnosis not present

## 2017-02-14 DIAGNOSIS — M25552 Pain in left hip: Secondary | ICD-10-CM | POA: Diagnosis not present

## 2017-02-14 DIAGNOSIS — I120 Hypertensive chronic kidney disease with stage 5 chronic kidney disease or end stage renal disease: Secondary | ICD-10-CM | POA: Diagnosis not present

## 2017-02-15 DIAGNOSIS — N186 End stage renal disease: Secondary | ICD-10-CM | POA: Diagnosis not present

## 2017-02-15 DIAGNOSIS — M25552 Pain in left hip: Secondary | ICD-10-CM | POA: Diagnosis not present

## 2017-02-15 DIAGNOSIS — M5030 Other cervical disc degeneration, unspecified cervical region: Secondary | ICD-10-CM | POA: Diagnosis not present

## 2017-02-15 DIAGNOSIS — M48062 Spinal stenosis, lumbar region with neurogenic claudication: Secondary | ICD-10-CM | POA: Diagnosis not present

## 2017-02-15 DIAGNOSIS — I120 Hypertensive chronic kidney disease with stage 5 chronic kidney disease or end stage renal disease: Secondary | ICD-10-CM | POA: Diagnosis not present

## 2017-02-15 DIAGNOSIS — M4712 Other spondylosis with myelopathy, cervical region: Secondary | ICD-10-CM | POA: Diagnosis not present

## 2017-02-17 DIAGNOSIS — M48062 Spinal stenosis, lumbar region with neurogenic claudication: Secondary | ICD-10-CM | POA: Diagnosis not present

## 2017-02-17 DIAGNOSIS — M5030 Other cervical disc degeneration, unspecified cervical region: Secondary | ICD-10-CM | POA: Diagnosis not present

## 2017-02-17 DIAGNOSIS — M25552 Pain in left hip: Secondary | ICD-10-CM | POA: Diagnosis not present

## 2017-02-17 DIAGNOSIS — N186 End stage renal disease: Secondary | ICD-10-CM | POA: Diagnosis not present

## 2017-02-17 DIAGNOSIS — I120 Hypertensive chronic kidney disease with stage 5 chronic kidney disease or end stage renal disease: Secondary | ICD-10-CM | POA: Diagnosis not present

## 2017-02-17 DIAGNOSIS — M4712 Other spondylosis with myelopathy, cervical region: Secondary | ICD-10-CM | POA: Diagnosis not present

## 2017-02-22 DIAGNOSIS — N186 End stage renal disease: Secondary | ICD-10-CM | POA: Diagnosis not present

## 2017-02-22 DIAGNOSIS — M5030 Other cervical disc degeneration, unspecified cervical region: Secondary | ICD-10-CM | POA: Diagnosis not present

## 2017-02-22 DIAGNOSIS — M4712 Other spondylosis with myelopathy, cervical region: Secondary | ICD-10-CM | POA: Diagnosis not present

## 2017-02-22 DIAGNOSIS — M25552 Pain in left hip: Secondary | ICD-10-CM | POA: Diagnosis not present

## 2017-02-22 DIAGNOSIS — I120 Hypertensive chronic kidney disease with stage 5 chronic kidney disease or end stage renal disease: Secondary | ICD-10-CM | POA: Diagnosis not present

## 2017-02-22 DIAGNOSIS — M48062 Spinal stenosis, lumbar region with neurogenic claudication: Secondary | ICD-10-CM | POA: Diagnosis not present

## 2017-02-24 DIAGNOSIS — M25552 Pain in left hip: Secondary | ICD-10-CM | POA: Diagnosis not present

## 2017-02-24 DIAGNOSIS — I120 Hypertensive chronic kidney disease with stage 5 chronic kidney disease or end stage renal disease: Secondary | ICD-10-CM | POA: Diagnosis not present

## 2017-02-24 DIAGNOSIS — M4712 Other spondylosis with myelopathy, cervical region: Secondary | ICD-10-CM | POA: Diagnosis not present

## 2017-02-24 DIAGNOSIS — M48062 Spinal stenosis, lumbar region with neurogenic claudication: Secondary | ICD-10-CM | POA: Diagnosis not present

## 2017-02-24 DIAGNOSIS — N186 End stage renal disease: Secondary | ICD-10-CM | POA: Diagnosis not present

## 2017-02-24 DIAGNOSIS — M5030 Other cervical disc degeneration, unspecified cervical region: Secondary | ICD-10-CM | POA: Diagnosis not present

## 2017-03-01 DIAGNOSIS — M4712 Other spondylosis with myelopathy, cervical region: Secondary | ICD-10-CM | POA: Diagnosis not present

## 2017-03-01 DIAGNOSIS — I120 Hypertensive chronic kidney disease with stage 5 chronic kidney disease or end stage renal disease: Secondary | ICD-10-CM | POA: Diagnosis not present

## 2017-03-01 DIAGNOSIS — M5030 Other cervical disc degeneration, unspecified cervical region: Secondary | ICD-10-CM | POA: Diagnosis not present

## 2017-03-01 DIAGNOSIS — M48062 Spinal stenosis, lumbar region with neurogenic claudication: Secondary | ICD-10-CM | POA: Diagnosis not present

## 2017-03-01 DIAGNOSIS — M25552 Pain in left hip: Secondary | ICD-10-CM | POA: Diagnosis not present

## 2017-03-01 DIAGNOSIS — N186 End stage renal disease: Secondary | ICD-10-CM | POA: Diagnosis not present

## 2017-03-02 DIAGNOSIS — M25552 Pain in left hip: Secondary | ICD-10-CM | POA: Diagnosis not present

## 2017-03-02 DIAGNOSIS — I120 Hypertensive chronic kidney disease with stage 5 chronic kidney disease or end stage renal disease: Secondary | ICD-10-CM | POA: Diagnosis not present

## 2017-03-02 DIAGNOSIS — M4712 Other spondylosis with myelopathy, cervical region: Secondary | ICD-10-CM | POA: Diagnosis not present

## 2017-03-02 DIAGNOSIS — N186 End stage renal disease: Secondary | ICD-10-CM | POA: Diagnosis not present

## 2017-03-02 DIAGNOSIS — M5030 Other cervical disc degeneration, unspecified cervical region: Secondary | ICD-10-CM | POA: Diagnosis not present

## 2017-03-02 DIAGNOSIS — M48062 Spinal stenosis, lumbar region with neurogenic claudication: Secondary | ICD-10-CM | POA: Diagnosis not present

## 2017-03-03 DIAGNOSIS — I120 Hypertensive chronic kidney disease with stage 5 chronic kidney disease or end stage renal disease: Secondary | ICD-10-CM | POA: Diagnosis not present

## 2017-03-03 DIAGNOSIS — M5030 Other cervical disc degeneration, unspecified cervical region: Secondary | ICD-10-CM | POA: Diagnosis not present

## 2017-03-03 DIAGNOSIS — M25552 Pain in left hip: Secondary | ICD-10-CM | POA: Diagnosis not present

## 2017-03-03 DIAGNOSIS — M4712 Other spondylosis with myelopathy, cervical region: Secondary | ICD-10-CM | POA: Diagnosis not present

## 2017-03-03 DIAGNOSIS — N186 End stage renal disease: Secondary | ICD-10-CM | POA: Diagnosis not present

## 2017-03-03 DIAGNOSIS — M48062 Spinal stenosis, lumbar region with neurogenic claudication: Secondary | ICD-10-CM | POA: Diagnosis not present

## 2017-03-03 DIAGNOSIS — Z992 Dependence on renal dialysis: Secondary | ICD-10-CM | POA: Diagnosis not present

## 2017-03-08 DIAGNOSIS — M48062 Spinal stenosis, lumbar region with neurogenic claudication: Secondary | ICD-10-CM | POA: Diagnosis not present

## 2017-03-08 DIAGNOSIS — M5030 Other cervical disc degeneration, unspecified cervical region: Secondary | ICD-10-CM | POA: Diagnosis not present

## 2017-03-08 DIAGNOSIS — M25552 Pain in left hip: Secondary | ICD-10-CM | POA: Diagnosis not present

## 2017-03-08 DIAGNOSIS — N186 End stage renal disease: Secondary | ICD-10-CM | POA: Diagnosis not present

## 2017-03-08 DIAGNOSIS — I120 Hypertensive chronic kidney disease with stage 5 chronic kidney disease or end stage renal disease: Secondary | ICD-10-CM | POA: Diagnosis not present

## 2017-03-08 DIAGNOSIS — M4712 Other spondylosis with myelopathy, cervical region: Secondary | ICD-10-CM | POA: Diagnosis not present

## 2017-03-10 DIAGNOSIS — M25552 Pain in left hip: Secondary | ICD-10-CM | POA: Diagnosis not present

## 2017-03-10 DIAGNOSIS — M5030 Other cervical disc degeneration, unspecified cervical region: Secondary | ICD-10-CM | POA: Diagnosis not present

## 2017-03-10 DIAGNOSIS — N186 End stage renal disease: Secondary | ICD-10-CM | POA: Diagnosis not present

## 2017-03-10 DIAGNOSIS — M4712 Other spondylosis with myelopathy, cervical region: Secondary | ICD-10-CM | POA: Diagnosis not present

## 2017-03-10 DIAGNOSIS — I120 Hypertensive chronic kidney disease with stage 5 chronic kidney disease or end stage renal disease: Secondary | ICD-10-CM | POA: Diagnosis not present

## 2017-03-10 DIAGNOSIS — M48062 Spinal stenosis, lumbar region with neurogenic claudication: Secondary | ICD-10-CM | POA: Diagnosis not present

## 2017-03-15 DIAGNOSIS — I120 Hypertensive chronic kidney disease with stage 5 chronic kidney disease or end stage renal disease: Secondary | ICD-10-CM | POA: Diagnosis not present

## 2017-03-15 DIAGNOSIS — M5030 Other cervical disc degeneration, unspecified cervical region: Secondary | ICD-10-CM | POA: Diagnosis not present

## 2017-03-15 DIAGNOSIS — M25552 Pain in left hip: Secondary | ICD-10-CM | POA: Diagnosis not present

## 2017-03-15 DIAGNOSIS — M48062 Spinal stenosis, lumbar region with neurogenic claudication: Secondary | ICD-10-CM | POA: Diagnosis not present

## 2017-03-15 DIAGNOSIS — M4712 Other spondylosis with myelopathy, cervical region: Secondary | ICD-10-CM | POA: Diagnosis not present

## 2017-03-15 DIAGNOSIS — N186 End stage renal disease: Secondary | ICD-10-CM | POA: Diagnosis not present

## 2017-03-18 DIAGNOSIS — N186 End stage renal disease: Secondary | ICD-10-CM | POA: Diagnosis not present

## 2017-03-18 DIAGNOSIS — I120 Hypertensive chronic kidney disease with stage 5 chronic kidney disease or end stage renal disease: Secondary | ICD-10-CM | POA: Diagnosis not present

## 2017-03-18 DIAGNOSIS — M5030 Other cervical disc degeneration, unspecified cervical region: Secondary | ICD-10-CM | POA: Diagnosis not present

## 2017-03-18 DIAGNOSIS — M25552 Pain in left hip: Secondary | ICD-10-CM | POA: Diagnosis not present

## 2017-03-18 DIAGNOSIS — M48062 Spinal stenosis, lumbar region with neurogenic claudication: Secondary | ICD-10-CM | POA: Diagnosis not present

## 2017-03-18 DIAGNOSIS — M4712 Other spondylosis with myelopathy, cervical region: Secondary | ICD-10-CM | POA: Diagnosis not present

## 2017-03-22 DIAGNOSIS — M48062 Spinal stenosis, lumbar region with neurogenic claudication: Secondary | ICD-10-CM | POA: Diagnosis not present

## 2017-03-22 DIAGNOSIS — I120 Hypertensive chronic kidney disease with stage 5 chronic kidney disease or end stage renal disease: Secondary | ICD-10-CM | POA: Diagnosis not present

## 2017-03-22 DIAGNOSIS — N186 End stage renal disease: Secondary | ICD-10-CM | POA: Diagnosis not present

## 2017-03-22 DIAGNOSIS — M25552 Pain in left hip: Secondary | ICD-10-CM | POA: Diagnosis not present

## 2017-03-22 DIAGNOSIS — M4712 Other spondylosis with myelopathy, cervical region: Secondary | ICD-10-CM | POA: Diagnosis not present

## 2017-03-22 DIAGNOSIS — M5030 Other cervical disc degeneration, unspecified cervical region: Secondary | ICD-10-CM | POA: Diagnosis not present

## 2017-03-24 DIAGNOSIS — M25552 Pain in left hip: Secondary | ICD-10-CM | POA: Diagnosis not present

## 2017-03-24 DIAGNOSIS — M5030 Other cervical disc degeneration, unspecified cervical region: Secondary | ICD-10-CM | POA: Diagnosis not present

## 2017-03-24 DIAGNOSIS — I120 Hypertensive chronic kidney disease with stage 5 chronic kidney disease or end stage renal disease: Secondary | ICD-10-CM | POA: Diagnosis not present

## 2017-03-24 DIAGNOSIS — M48062 Spinal stenosis, lumbar region with neurogenic claudication: Secondary | ICD-10-CM | POA: Diagnosis not present

## 2017-03-24 DIAGNOSIS — M4712 Other spondylosis with myelopathy, cervical region: Secondary | ICD-10-CM | POA: Diagnosis not present

## 2017-03-24 DIAGNOSIS — N186 End stage renal disease: Secondary | ICD-10-CM | POA: Diagnosis not present

## 2017-03-27 DIAGNOSIS — K219 Gastro-esophageal reflux disease without esophagitis: Secondary | ICD-10-CM | POA: Diagnosis not present

## 2017-03-27 DIAGNOSIS — F410 Panic disorder [episodic paroxysmal anxiety] without agoraphobia: Secondary | ICD-10-CM | POA: Diagnosis not present

## 2017-03-27 DIAGNOSIS — Z992 Dependence on renal dialysis: Secondary | ICD-10-CM | POA: Diagnosis not present

## 2017-03-27 DIAGNOSIS — M48062 Spinal stenosis, lumbar region with neurogenic claudication: Secondary | ICD-10-CM | POA: Diagnosis not present

## 2017-03-27 DIAGNOSIS — F329 Major depressive disorder, single episode, unspecified: Secondary | ICD-10-CM | POA: Diagnosis not present

## 2017-03-27 DIAGNOSIS — N186 End stage renal disease: Secondary | ICD-10-CM | POA: Diagnosis not present

## 2017-03-27 DIAGNOSIS — M4712 Other spondylosis with myelopathy, cervical region: Secondary | ICD-10-CM | POA: Diagnosis not present

## 2017-03-27 DIAGNOSIS — G4733 Obstructive sleep apnea (adult) (pediatric): Secondary | ICD-10-CM | POA: Diagnosis not present

## 2017-03-27 DIAGNOSIS — Z8673 Personal history of transient ischemic attack (TIA), and cerebral infarction without residual deficits: Secondary | ICD-10-CM | POA: Diagnosis not present

## 2017-03-27 DIAGNOSIS — I120 Hypertensive chronic kidney disease with stage 5 chronic kidney disease or end stage renal disease: Secondary | ICD-10-CM | POA: Diagnosis not present

## 2017-03-27 DIAGNOSIS — M8580 Other specified disorders of bone density and structure, unspecified site: Secondary | ICD-10-CM | POA: Diagnosis not present

## 2017-03-27 DIAGNOSIS — K589 Irritable bowel syndrome without diarrhea: Secondary | ICD-10-CM | POA: Diagnosis not present

## 2017-03-27 DIAGNOSIS — Z85528 Personal history of other malignant neoplasm of kidney: Secondary | ICD-10-CM | POA: Diagnosis not present

## 2017-03-27 DIAGNOSIS — F4322 Adjustment disorder with anxiety: Secondary | ICD-10-CM | POA: Diagnosis not present

## 2017-03-27 DIAGNOSIS — M5030 Other cervical disc degeneration, unspecified cervical region: Secondary | ICD-10-CM | POA: Diagnosis not present

## 2017-03-29 DIAGNOSIS — I120 Hypertensive chronic kidney disease with stage 5 chronic kidney disease or end stage renal disease: Secondary | ICD-10-CM | POA: Diagnosis not present

## 2017-03-29 DIAGNOSIS — M5030 Other cervical disc degeneration, unspecified cervical region: Secondary | ICD-10-CM | POA: Diagnosis not present

## 2017-03-29 DIAGNOSIS — M4712 Other spondylosis with myelopathy, cervical region: Secondary | ICD-10-CM | POA: Diagnosis not present

## 2017-03-29 DIAGNOSIS — F410 Panic disorder [episodic paroxysmal anxiety] without agoraphobia: Secondary | ICD-10-CM | POA: Diagnosis not present

## 2017-03-29 DIAGNOSIS — M48062 Spinal stenosis, lumbar region with neurogenic claudication: Secondary | ICD-10-CM | POA: Diagnosis not present

## 2017-03-29 DIAGNOSIS — N186 End stage renal disease: Secondary | ICD-10-CM | POA: Diagnosis not present

## 2017-03-31 DIAGNOSIS — N186 End stage renal disease: Secondary | ICD-10-CM | POA: Diagnosis not present

## 2017-03-31 DIAGNOSIS — M5030 Other cervical disc degeneration, unspecified cervical region: Secondary | ICD-10-CM | POA: Diagnosis not present

## 2017-03-31 DIAGNOSIS — M4712 Other spondylosis with myelopathy, cervical region: Secondary | ICD-10-CM | POA: Diagnosis not present

## 2017-03-31 DIAGNOSIS — I120 Hypertensive chronic kidney disease with stage 5 chronic kidney disease or end stage renal disease: Secondary | ICD-10-CM | POA: Diagnosis not present

## 2017-03-31 DIAGNOSIS — F410 Panic disorder [episodic paroxysmal anxiety] without agoraphobia: Secondary | ICD-10-CM | POA: Diagnosis not present

## 2017-03-31 DIAGNOSIS — M48062 Spinal stenosis, lumbar region with neurogenic claudication: Secondary | ICD-10-CM | POA: Diagnosis not present

## 2017-04-03 DIAGNOSIS — Z992 Dependence on renal dialysis: Secondary | ICD-10-CM | POA: Diagnosis not present

## 2017-04-03 DIAGNOSIS — N186 End stage renal disease: Secondary | ICD-10-CM | POA: Diagnosis not present

## 2017-04-05 DIAGNOSIS — I120 Hypertensive chronic kidney disease with stage 5 chronic kidney disease or end stage renal disease: Secondary | ICD-10-CM | POA: Diagnosis not present

## 2017-04-05 DIAGNOSIS — M5030 Other cervical disc degeneration, unspecified cervical region: Secondary | ICD-10-CM | POA: Diagnosis not present

## 2017-04-05 DIAGNOSIS — F410 Panic disorder [episodic paroxysmal anxiety] without agoraphobia: Secondary | ICD-10-CM | POA: Diagnosis not present

## 2017-04-05 DIAGNOSIS — N186 End stage renal disease: Secondary | ICD-10-CM | POA: Diagnosis not present

## 2017-04-05 DIAGNOSIS — M4712 Other spondylosis with myelopathy, cervical region: Secondary | ICD-10-CM | POA: Diagnosis not present

## 2017-04-05 DIAGNOSIS — M48062 Spinal stenosis, lumbar region with neurogenic claudication: Secondary | ICD-10-CM | POA: Diagnosis not present

## 2017-04-07 DIAGNOSIS — I120 Hypertensive chronic kidney disease with stage 5 chronic kidney disease or end stage renal disease: Secondary | ICD-10-CM | POA: Diagnosis not present

## 2017-04-07 DIAGNOSIS — M5030 Other cervical disc degeneration, unspecified cervical region: Secondary | ICD-10-CM | POA: Diagnosis not present

## 2017-04-07 DIAGNOSIS — M48062 Spinal stenosis, lumbar region with neurogenic claudication: Secondary | ICD-10-CM | POA: Diagnosis not present

## 2017-04-07 DIAGNOSIS — M4712 Other spondylosis with myelopathy, cervical region: Secondary | ICD-10-CM | POA: Diagnosis not present

## 2017-04-07 DIAGNOSIS — F410 Panic disorder [episodic paroxysmal anxiety] without agoraphobia: Secondary | ICD-10-CM | POA: Diagnosis not present

## 2017-04-07 DIAGNOSIS — N186 End stage renal disease: Secondary | ICD-10-CM | POA: Diagnosis not present

## 2017-04-09 DIAGNOSIS — M4712 Other spondylosis with myelopathy, cervical region: Secondary | ICD-10-CM | POA: Diagnosis not present

## 2017-04-09 DIAGNOSIS — I120 Hypertensive chronic kidney disease with stage 5 chronic kidney disease or end stage renal disease: Secondary | ICD-10-CM | POA: Diagnosis not present

## 2017-04-09 DIAGNOSIS — M48062 Spinal stenosis, lumbar region with neurogenic claudication: Secondary | ICD-10-CM | POA: Diagnosis not present

## 2017-04-09 DIAGNOSIS — F410 Panic disorder [episodic paroxysmal anxiety] without agoraphobia: Secondary | ICD-10-CM | POA: Diagnosis not present

## 2017-04-09 DIAGNOSIS — M5030 Other cervical disc degeneration, unspecified cervical region: Secondary | ICD-10-CM | POA: Diagnosis not present

## 2017-04-09 DIAGNOSIS — N186 End stage renal disease: Secondary | ICD-10-CM | POA: Diagnosis not present

## 2017-04-12 DIAGNOSIS — M5030 Other cervical disc degeneration, unspecified cervical region: Secondary | ICD-10-CM | POA: Diagnosis not present

## 2017-04-12 DIAGNOSIS — M4712 Other spondylosis with myelopathy, cervical region: Secondary | ICD-10-CM | POA: Diagnosis not present

## 2017-04-12 DIAGNOSIS — I120 Hypertensive chronic kidney disease with stage 5 chronic kidney disease or end stage renal disease: Secondary | ICD-10-CM | POA: Diagnosis not present

## 2017-04-12 DIAGNOSIS — N186 End stage renal disease: Secondary | ICD-10-CM | POA: Diagnosis not present

## 2017-04-12 DIAGNOSIS — M48062 Spinal stenosis, lumbar region with neurogenic claudication: Secondary | ICD-10-CM | POA: Diagnosis not present

## 2017-04-12 DIAGNOSIS — F410 Panic disorder [episodic paroxysmal anxiety] without agoraphobia: Secondary | ICD-10-CM | POA: Diagnosis not present

## 2017-04-14 DIAGNOSIS — N186 End stage renal disease: Secondary | ICD-10-CM | POA: Diagnosis not present

## 2017-04-14 DIAGNOSIS — F410 Panic disorder [episodic paroxysmal anxiety] without agoraphobia: Secondary | ICD-10-CM | POA: Diagnosis not present

## 2017-04-14 DIAGNOSIS — M5030 Other cervical disc degeneration, unspecified cervical region: Secondary | ICD-10-CM | POA: Diagnosis not present

## 2017-04-14 DIAGNOSIS — I120 Hypertensive chronic kidney disease with stage 5 chronic kidney disease or end stage renal disease: Secondary | ICD-10-CM | POA: Diagnosis not present

## 2017-04-14 DIAGNOSIS — M48062 Spinal stenosis, lumbar region with neurogenic claudication: Secondary | ICD-10-CM | POA: Diagnosis not present

## 2017-04-14 DIAGNOSIS — M4712 Other spondylosis with myelopathy, cervical region: Secondary | ICD-10-CM | POA: Diagnosis not present

## 2017-04-19 DIAGNOSIS — M48062 Spinal stenosis, lumbar region with neurogenic claudication: Secondary | ICD-10-CM | POA: Diagnosis not present

## 2017-04-19 DIAGNOSIS — N186 End stage renal disease: Secondary | ICD-10-CM | POA: Diagnosis not present

## 2017-04-19 DIAGNOSIS — F410 Panic disorder [episodic paroxysmal anxiety] without agoraphobia: Secondary | ICD-10-CM | POA: Diagnosis not present

## 2017-04-19 DIAGNOSIS — M4712 Other spondylosis with myelopathy, cervical region: Secondary | ICD-10-CM | POA: Diagnosis not present

## 2017-04-19 DIAGNOSIS — M5030 Other cervical disc degeneration, unspecified cervical region: Secondary | ICD-10-CM | POA: Diagnosis not present

## 2017-04-19 DIAGNOSIS — I120 Hypertensive chronic kidney disease with stage 5 chronic kidney disease or end stage renal disease: Secondary | ICD-10-CM | POA: Diagnosis not present

## 2017-04-21 DIAGNOSIS — F410 Panic disorder [episodic paroxysmal anxiety] without agoraphobia: Secondary | ICD-10-CM | POA: Diagnosis not present

## 2017-04-21 DIAGNOSIS — I120 Hypertensive chronic kidney disease with stage 5 chronic kidney disease or end stage renal disease: Secondary | ICD-10-CM | POA: Diagnosis not present

## 2017-04-21 DIAGNOSIS — M4712 Other spondylosis with myelopathy, cervical region: Secondary | ICD-10-CM | POA: Diagnosis not present

## 2017-04-21 DIAGNOSIS — N186 End stage renal disease: Secondary | ICD-10-CM | POA: Diagnosis not present

## 2017-04-21 DIAGNOSIS — M5030 Other cervical disc degeneration, unspecified cervical region: Secondary | ICD-10-CM | POA: Diagnosis not present

## 2017-04-21 DIAGNOSIS — M48062 Spinal stenosis, lumbar region with neurogenic claudication: Secondary | ICD-10-CM | POA: Diagnosis not present

## 2017-04-22 DIAGNOSIS — I120 Hypertensive chronic kidney disease with stage 5 chronic kidney disease or end stage renal disease: Secondary | ICD-10-CM | POA: Diagnosis not present

## 2017-04-22 DIAGNOSIS — F410 Panic disorder [episodic paroxysmal anxiety] without agoraphobia: Secondary | ICD-10-CM | POA: Diagnosis not present

## 2017-04-22 DIAGNOSIS — M4712 Other spondylosis with myelopathy, cervical region: Secondary | ICD-10-CM | POA: Diagnosis not present

## 2017-04-22 DIAGNOSIS — N186 End stage renal disease: Secondary | ICD-10-CM | POA: Diagnosis not present

## 2017-04-22 DIAGNOSIS — M48062 Spinal stenosis, lumbar region with neurogenic claudication: Secondary | ICD-10-CM | POA: Diagnosis not present

## 2017-04-22 DIAGNOSIS — M5030 Other cervical disc degeneration, unspecified cervical region: Secondary | ICD-10-CM | POA: Diagnosis not present

## 2017-04-26 DIAGNOSIS — M4712 Other spondylosis with myelopathy, cervical region: Secondary | ICD-10-CM | POA: Diagnosis not present

## 2017-04-26 DIAGNOSIS — M5030 Other cervical disc degeneration, unspecified cervical region: Secondary | ICD-10-CM | POA: Diagnosis not present

## 2017-04-26 DIAGNOSIS — N186 End stage renal disease: Secondary | ICD-10-CM | POA: Diagnosis not present

## 2017-04-26 DIAGNOSIS — I120 Hypertensive chronic kidney disease with stage 5 chronic kidney disease or end stage renal disease: Secondary | ICD-10-CM | POA: Diagnosis not present

## 2017-04-26 DIAGNOSIS — M48062 Spinal stenosis, lumbar region with neurogenic claudication: Secondary | ICD-10-CM | POA: Diagnosis not present

## 2017-04-26 DIAGNOSIS — F410 Panic disorder [episodic paroxysmal anxiety] without agoraphobia: Secondary | ICD-10-CM | POA: Diagnosis not present

## 2017-04-28 DIAGNOSIS — M48062 Spinal stenosis, lumbar region with neurogenic claudication: Secondary | ICD-10-CM | POA: Diagnosis not present

## 2017-04-28 DIAGNOSIS — M4712 Other spondylosis with myelopathy, cervical region: Secondary | ICD-10-CM | POA: Diagnosis not present

## 2017-04-28 DIAGNOSIS — F410 Panic disorder [episodic paroxysmal anxiety] without agoraphobia: Secondary | ICD-10-CM | POA: Diagnosis not present

## 2017-04-28 DIAGNOSIS — N186 End stage renal disease: Secondary | ICD-10-CM | POA: Diagnosis not present

## 2017-04-28 DIAGNOSIS — M5030 Other cervical disc degeneration, unspecified cervical region: Secondary | ICD-10-CM | POA: Diagnosis not present

## 2017-04-28 DIAGNOSIS — I120 Hypertensive chronic kidney disease with stage 5 chronic kidney disease or end stage renal disease: Secondary | ICD-10-CM | POA: Diagnosis not present

## 2017-05-03 DIAGNOSIS — M4712 Other spondylosis with myelopathy, cervical region: Secondary | ICD-10-CM | POA: Diagnosis not present

## 2017-05-03 DIAGNOSIS — M48062 Spinal stenosis, lumbar region with neurogenic claudication: Secondary | ICD-10-CM | POA: Diagnosis not present

## 2017-05-03 DIAGNOSIS — F410 Panic disorder [episodic paroxysmal anxiety] without agoraphobia: Secondary | ICD-10-CM | POA: Diagnosis not present

## 2017-05-03 DIAGNOSIS — M5030 Other cervical disc degeneration, unspecified cervical region: Secondary | ICD-10-CM | POA: Diagnosis not present

## 2017-05-03 DIAGNOSIS — I120 Hypertensive chronic kidney disease with stage 5 chronic kidney disease or end stage renal disease: Secondary | ICD-10-CM | POA: Diagnosis not present

## 2017-05-03 DIAGNOSIS — Z992 Dependence on renal dialysis: Secondary | ICD-10-CM | POA: Diagnosis not present

## 2017-05-03 DIAGNOSIS — N186 End stage renal disease: Secondary | ICD-10-CM | POA: Diagnosis not present

## 2017-05-05 DIAGNOSIS — F410 Panic disorder [episodic paroxysmal anxiety] without agoraphobia: Secondary | ICD-10-CM | POA: Diagnosis not present

## 2017-05-05 DIAGNOSIS — M4712 Other spondylosis with myelopathy, cervical region: Secondary | ICD-10-CM | POA: Diagnosis not present

## 2017-05-05 DIAGNOSIS — M5030 Other cervical disc degeneration, unspecified cervical region: Secondary | ICD-10-CM | POA: Diagnosis not present

## 2017-05-05 DIAGNOSIS — N186 End stage renal disease: Secondary | ICD-10-CM | POA: Diagnosis not present

## 2017-05-05 DIAGNOSIS — M48062 Spinal stenosis, lumbar region with neurogenic claudication: Secondary | ICD-10-CM | POA: Diagnosis not present

## 2017-05-05 DIAGNOSIS — I120 Hypertensive chronic kidney disease with stage 5 chronic kidney disease or end stage renal disease: Secondary | ICD-10-CM | POA: Diagnosis not present

## 2017-05-10 DIAGNOSIS — M5030 Other cervical disc degeneration, unspecified cervical region: Secondary | ICD-10-CM | POA: Diagnosis not present

## 2017-05-10 DIAGNOSIS — M4712 Other spondylosis with myelopathy, cervical region: Secondary | ICD-10-CM | POA: Diagnosis not present

## 2017-05-10 DIAGNOSIS — M48062 Spinal stenosis, lumbar region with neurogenic claudication: Secondary | ICD-10-CM | POA: Diagnosis not present

## 2017-05-10 DIAGNOSIS — N186 End stage renal disease: Secondary | ICD-10-CM | POA: Diagnosis not present

## 2017-05-10 DIAGNOSIS — I120 Hypertensive chronic kidney disease with stage 5 chronic kidney disease or end stage renal disease: Secondary | ICD-10-CM | POA: Diagnosis not present

## 2017-05-10 DIAGNOSIS — F410 Panic disorder [episodic paroxysmal anxiety] without agoraphobia: Secondary | ICD-10-CM | POA: Diagnosis not present

## 2017-05-12 DIAGNOSIS — N186 End stage renal disease: Secondary | ICD-10-CM | POA: Diagnosis not present

## 2017-05-12 DIAGNOSIS — I120 Hypertensive chronic kidney disease with stage 5 chronic kidney disease or end stage renal disease: Secondary | ICD-10-CM | POA: Diagnosis not present

## 2017-05-12 DIAGNOSIS — M48062 Spinal stenosis, lumbar region with neurogenic claudication: Secondary | ICD-10-CM | POA: Diagnosis not present

## 2017-05-12 DIAGNOSIS — M5030 Other cervical disc degeneration, unspecified cervical region: Secondary | ICD-10-CM | POA: Diagnosis not present

## 2017-05-12 DIAGNOSIS — F410 Panic disorder [episodic paroxysmal anxiety] without agoraphobia: Secondary | ICD-10-CM | POA: Diagnosis not present

## 2017-05-12 DIAGNOSIS — M4712 Other spondylosis with myelopathy, cervical region: Secondary | ICD-10-CM | POA: Diagnosis not present

## 2017-05-17 DIAGNOSIS — N186 End stage renal disease: Secondary | ICD-10-CM | POA: Diagnosis not present

## 2017-05-17 DIAGNOSIS — M4712 Other spondylosis with myelopathy, cervical region: Secondary | ICD-10-CM | POA: Diagnosis not present

## 2017-05-17 DIAGNOSIS — M48062 Spinal stenosis, lumbar region with neurogenic claudication: Secondary | ICD-10-CM | POA: Diagnosis not present

## 2017-05-17 DIAGNOSIS — F410 Panic disorder [episodic paroxysmal anxiety] without agoraphobia: Secondary | ICD-10-CM | POA: Diagnosis not present

## 2017-05-17 DIAGNOSIS — I120 Hypertensive chronic kidney disease with stage 5 chronic kidney disease or end stage renal disease: Secondary | ICD-10-CM | POA: Diagnosis not present

## 2017-05-17 DIAGNOSIS — M5030 Other cervical disc degeneration, unspecified cervical region: Secondary | ICD-10-CM | POA: Diagnosis not present

## 2017-05-18 DIAGNOSIS — I120 Hypertensive chronic kidney disease with stage 5 chronic kidney disease or end stage renal disease: Secondary | ICD-10-CM | POA: Diagnosis not present

## 2017-05-18 DIAGNOSIS — F410 Panic disorder [episodic paroxysmal anxiety] without agoraphobia: Secondary | ICD-10-CM | POA: Diagnosis not present

## 2017-05-18 DIAGNOSIS — M48062 Spinal stenosis, lumbar region with neurogenic claudication: Secondary | ICD-10-CM | POA: Diagnosis not present

## 2017-05-18 DIAGNOSIS — N186 End stage renal disease: Secondary | ICD-10-CM | POA: Diagnosis not present

## 2017-05-18 DIAGNOSIS — M4712 Other spondylosis with myelopathy, cervical region: Secondary | ICD-10-CM | POA: Diagnosis not present

## 2017-05-18 DIAGNOSIS — M5030 Other cervical disc degeneration, unspecified cervical region: Secondary | ICD-10-CM | POA: Diagnosis not present

## 2017-05-21 DIAGNOSIS — M48062 Spinal stenosis, lumbar region with neurogenic claudication: Secondary | ICD-10-CM | POA: Diagnosis not present

## 2017-05-21 DIAGNOSIS — M5030 Other cervical disc degeneration, unspecified cervical region: Secondary | ICD-10-CM | POA: Diagnosis not present

## 2017-05-21 DIAGNOSIS — F410 Panic disorder [episodic paroxysmal anxiety] without agoraphobia: Secondary | ICD-10-CM | POA: Diagnosis not present

## 2017-05-21 DIAGNOSIS — N186 End stage renal disease: Secondary | ICD-10-CM | POA: Diagnosis not present

## 2017-05-21 DIAGNOSIS — I120 Hypertensive chronic kidney disease with stage 5 chronic kidney disease or end stage renal disease: Secondary | ICD-10-CM | POA: Diagnosis not present

## 2017-05-21 DIAGNOSIS — M4712 Other spondylosis with myelopathy, cervical region: Secondary | ICD-10-CM | POA: Diagnosis not present

## 2017-05-24 DIAGNOSIS — M5030 Other cervical disc degeneration, unspecified cervical region: Secondary | ICD-10-CM | POA: Diagnosis not present

## 2017-05-24 DIAGNOSIS — F410 Panic disorder [episodic paroxysmal anxiety] without agoraphobia: Secondary | ICD-10-CM | POA: Diagnosis not present

## 2017-05-24 DIAGNOSIS — I120 Hypertensive chronic kidney disease with stage 5 chronic kidney disease or end stage renal disease: Secondary | ICD-10-CM | POA: Diagnosis not present

## 2017-05-24 DIAGNOSIS — M48062 Spinal stenosis, lumbar region with neurogenic claudication: Secondary | ICD-10-CM | POA: Diagnosis not present

## 2017-05-24 DIAGNOSIS — M4712 Other spondylosis with myelopathy, cervical region: Secondary | ICD-10-CM | POA: Diagnosis not present

## 2017-05-24 DIAGNOSIS — N186 End stage renal disease: Secondary | ICD-10-CM | POA: Diagnosis not present

## 2017-05-26 DIAGNOSIS — G4733 Obstructive sleep apnea (adult) (pediatric): Secondary | ICD-10-CM | POA: Diagnosis not present

## 2017-05-26 DIAGNOSIS — M4712 Other spondylosis with myelopathy, cervical region: Secondary | ICD-10-CM | POA: Diagnosis not present

## 2017-05-26 DIAGNOSIS — F410 Panic disorder [episodic paroxysmal anxiety] without agoraphobia: Secondary | ICD-10-CM | POA: Diagnosis not present

## 2017-05-26 DIAGNOSIS — I120 Hypertensive chronic kidney disease with stage 5 chronic kidney disease or end stage renal disease: Secondary | ICD-10-CM | POA: Diagnosis not present

## 2017-05-26 DIAGNOSIS — F4322 Adjustment disorder with anxiety: Secondary | ICD-10-CM | POA: Diagnosis not present

## 2017-05-26 DIAGNOSIS — K589 Irritable bowel syndrome without diarrhea: Secondary | ICD-10-CM | POA: Diagnosis not present

## 2017-05-26 DIAGNOSIS — M5030 Other cervical disc degeneration, unspecified cervical region: Secondary | ICD-10-CM | POA: Diagnosis not present

## 2017-05-26 DIAGNOSIS — M48062 Spinal stenosis, lumbar region with neurogenic claudication: Secondary | ICD-10-CM | POA: Diagnosis not present

## 2017-05-26 DIAGNOSIS — F329 Major depressive disorder, single episode, unspecified: Secondary | ICD-10-CM | POA: Diagnosis not present

## 2017-05-26 DIAGNOSIS — Z8673 Personal history of transient ischemic attack (TIA), and cerebral infarction without residual deficits: Secondary | ICD-10-CM | POA: Diagnosis not present

## 2017-05-26 DIAGNOSIS — K219 Gastro-esophageal reflux disease without esophagitis: Secondary | ICD-10-CM | POA: Diagnosis not present

## 2017-05-26 DIAGNOSIS — Z85528 Personal history of other malignant neoplasm of kidney: Secondary | ICD-10-CM | POA: Diagnosis not present

## 2017-05-26 DIAGNOSIS — N186 End stage renal disease: Secondary | ICD-10-CM | POA: Diagnosis not present

## 2017-05-26 DIAGNOSIS — Z992 Dependence on renal dialysis: Secondary | ICD-10-CM | POA: Diagnosis not present

## 2017-05-26 DIAGNOSIS — M8580 Other specified disorders of bone density and structure, unspecified site: Secondary | ICD-10-CM | POA: Diagnosis not present

## 2017-05-26 DIAGNOSIS — Z9181 History of falling: Secondary | ICD-10-CM | POA: Diagnosis not present

## 2017-06-02 DIAGNOSIS — M4712 Other spondylosis with myelopathy, cervical region: Secondary | ICD-10-CM | POA: Diagnosis not present

## 2017-06-02 DIAGNOSIS — M5030 Other cervical disc degeneration, unspecified cervical region: Secondary | ICD-10-CM | POA: Diagnosis not present

## 2017-06-02 DIAGNOSIS — M48062 Spinal stenosis, lumbar region with neurogenic claudication: Secondary | ICD-10-CM | POA: Diagnosis not present

## 2017-06-02 DIAGNOSIS — N186 End stage renal disease: Secondary | ICD-10-CM | POA: Diagnosis not present

## 2017-06-02 DIAGNOSIS — F410 Panic disorder [episodic paroxysmal anxiety] without agoraphobia: Secondary | ICD-10-CM | POA: Diagnosis not present

## 2017-06-02 DIAGNOSIS — I120 Hypertensive chronic kidney disease with stage 5 chronic kidney disease or end stage renal disease: Secondary | ICD-10-CM | POA: Diagnosis not present

## 2017-06-03 DIAGNOSIS — F410 Panic disorder [episodic paroxysmal anxiety] without agoraphobia: Secondary | ICD-10-CM | POA: Diagnosis not present

## 2017-06-03 DIAGNOSIS — Z992 Dependence on renal dialysis: Secondary | ICD-10-CM | POA: Diagnosis not present

## 2017-06-03 DIAGNOSIS — I120 Hypertensive chronic kidney disease with stage 5 chronic kidney disease or end stage renal disease: Secondary | ICD-10-CM | POA: Diagnosis not present

## 2017-06-03 DIAGNOSIS — M5030 Other cervical disc degeneration, unspecified cervical region: Secondary | ICD-10-CM | POA: Diagnosis not present

## 2017-06-03 DIAGNOSIS — N186 End stage renal disease: Secondary | ICD-10-CM | POA: Diagnosis not present

## 2017-06-03 DIAGNOSIS — M4712 Other spondylosis with myelopathy, cervical region: Secondary | ICD-10-CM | POA: Diagnosis not present

## 2017-06-03 DIAGNOSIS — M48062 Spinal stenosis, lumbar region with neurogenic claudication: Secondary | ICD-10-CM | POA: Diagnosis not present

## 2017-06-07 DIAGNOSIS — N186 End stage renal disease: Secondary | ICD-10-CM | POA: Diagnosis not present

## 2017-06-07 DIAGNOSIS — M4712 Other spondylosis with myelopathy, cervical region: Secondary | ICD-10-CM | POA: Diagnosis not present

## 2017-06-07 DIAGNOSIS — M48062 Spinal stenosis, lumbar region with neurogenic claudication: Secondary | ICD-10-CM | POA: Diagnosis not present

## 2017-06-07 DIAGNOSIS — F410 Panic disorder [episodic paroxysmal anxiety] without agoraphobia: Secondary | ICD-10-CM | POA: Diagnosis not present

## 2017-06-07 DIAGNOSIS — I120 Hypertensive chronic kidney disease with stage 5 chronic kidney disease or end stage renal disease: Secondary | ICD-10-CM | POA: Diagnosis not present

## 2017-06-07 DIAGNOSIS — M5030 Other cervical disc degeneration, unspecified cervical region: Secondary | ICD-10-CM | POA: Diagnosis not present

## 2017-06-09 DIAGNOSIS — M48062 Spinal stenosis, lumbar region with neurogenic claudication: Secondary | ICD-10-CM | POA: Diagnosis not present

## 2017-06-09 DIAGNOSIS — M5030 Other cervical disc degeneration, unspecified cervical region: Secondary | ICD-10-CM | POA: Diagnosis not present

## 2017-06-09 DIAGNOSIS — I120 Hypertensive chronic kidney disease with stage 5 chronic kidney disease or end stage renal disease: Secondary | ICD-10-CM | POA: Diagnosis not present

## 2017-06-09 DIAGNOSIS — F410 Panic disorder [episodic paroxysmal anxiety] without agoraphobia: Secondary | ICD-10-CM | POA: Diagnosis not present

## 2017-06-09 DIAGNOSIS — M4712 Other spondylosis with myelopathy, cervical region: Secondary | ICD-10-CM | POA: Diagnosis not present

## 2017-06-09 DIAGNOSIS — N186 End stage renal disease: Secondary | ICD-10-CM | POA: Diagnosis not present

## 2017-06-14 DIAGNOSIS — I120 Hypertensive chronic kidney disease with stage 5 chronic kidney disease or end stage renal disease: Secondary | ICD-10-CM | POA: Diagnosis not present

## 2017-06-14 DIAGNOSIS — M4712 Other spondylosis with myelopathy, cervical region: Secondary | ICD-10-CM | POA: Diagnosis not present

## 2017-06-14 DIAGNOSIS — N186 End stage renal disease: Secondary | ICD-10-CM | POA: Diagnosis not present

## 2017-06-14 DIAGNOSIS — M48062 Spinal stenosis, lumbar region with neurogenic claudication: Secondary | ICD-10-CM | POA: Diagnosis not present

## 2017-06-14 DIAGNOSIS — F410 Panic disorder [episodic paroxysmal anxiety] without agoraphobia: Secondary | ICD-10-CM | POA: Diagnosis not present

## 2017-06-14 DIAGNOSIS — M5030 Other cervical disc degeneration, unspecified cervical region: Secondary | ICD-10-CM | POA: Diagnosis not present

## 2017-06-16 DIAGNOSIS — M48062 Spinal stenosis, lumbar region with neurogenic claudication: Secondary | ICD-10-CM | POA: Diagnosis not present

## 2017-06-16 DIAGNOSIS — I120 Hypertensive chronic kidney disease with stage 5 chronic kidney disease or end stage renal disease: Secondary | ICD-10-CM | POA: Diagnosis not present

## 2017-06-16 DIAGNOSIS — N186 End stage renal disease: Secondary | ICD-10-CM | POA: Diagnosis not present

## 2017-06-16 DIAGNOSIS — M5030 Other cervical disc degeneration, unspecified cervical region: Secondary | ICD-10-CM | POA: Diagnosis not present

## 2017-06-16 DIAGNOSIS — F410 Panic disorder [episodic paroxysmal anxiety] without agoraphobia: Secondary | ICD-10-CM | POA: Diagnosis not present

## 2017-06-16 DIAGNOSIS — M4712 Other spondylosis with myelopathy, cervical region: Secondary | ICD-10-CM | POA: Diagnosis not present

## 2017-06-21 DIAGNOSIS — N186 End stage renal disease: Secondary | ICD-10-CM | POA: Diagnosis not present

## 2017-06-21 DIAGNOSIS — H25812 Combined forms of age-related cataract, left eye: Secondary | ICD-10-CM | POA: Diagnosis not present

## 2017-06-21 DIAGNOSIS — M4712 Other spondylosis with myelopathy, cervical region: Secondary | ICD-10-CM | POA: Diagnosis not present

## 2017-06-21 DIAGNOSIS — M48062 Spinal stenosis, lumbar region with neurogenic claudication: Secondary | ICD-10-CM | POA: Diagnosis not present

## 2017-06-21 DIAGNOSIS — Z961 Presence of intraocular lens: Secondary | ICD-10-CM | POA: Diagnosis not present

## 2017-06-21 DIAGNOSIS — F410 Panic disorder [episodic paroxysmal anxiety] without agoraphobia: Secondary | ICD-10-CM | POA: Diagnosis not present

## 2017-06-21 DIAGNOSIS — I120 Hypertensive chronic kidney disease with stage 5 chronic kidney disease or end stage renal disease: Secondary | ICD-10-CM | POA: Diagnosis not present

## 2017-06-21 DIAGNOSIS — M5030 Other cervical disc degeneration, unspecified cervical region: Secondary | ICD-10-CM | POA: Diagnosis not present

## 2017-06-23 DIAGNOSIS — F410 Panic disorder [episodic paroxysmal anxiety] without agoraphobia: Secondary | ICD-10-CM | POA: Diagnosis not present

## 2017-06-23 DIAGNOSIS — N186 End stage renal disease: Secondary | ICD-10-CM | POA: Diagnosis not present

## 2017-06-23 DIAGNOSIS — I120 Hypertensive chronic kidney disease with stage 5 chronic kidney disease or end stage renal disease: Secondary | ICD-10-CM | POA: Diagnosis not present

## 2017-06-23 DIAGNOSIS — M5030 Other cervical disc degeneration, unspecified cervical region: Secondary | ICD-10-CM | POA: Diagnosis not present

## 2017-06-23 DIAGNOSIS — M4712 Other spondylosis with myelopathy, cervical region: Secondary | ICD-10-CM | POA: Diagnosis not present

## 2017-06-23 DIAGNOSIS — M48062 Spinal stenosis, lumbar region with neurogenic claudication: Secondary | ICD-10-CM | POA: Diagnosis not present

## 2017-06-28 DIAGNOSIS — M4712 Other spondylosis with myelopathy, cervical region: Secondary | ICD-10-CM | POA: Diagnosis not present

## 2017-06-28 DIAGNOSIS — I120 Hypertensive chronic kidney disease with stage 5 chronic kidney disease or end stage renal disease: Secondary | ICD-10-CM | POA: Diagnosis not present

## 2017-06-28 DIAGNOSIS — F410 Panic disorder [episodic paroxysmal anxiety] without agoraphobia: Secondary | ICD-10-CM | POA: Diagnosis not present

## 2017-06-28 DIAGNOSIS — M5030 Other cervical disc degeneration, unspecified cervical region: Secondary | ICD-10-CM | POA: Diagnosis not present

## 2017-06-28 DIAGNOSIS — M48062 Spinal stenosis, lumbar region with neurogenic claudication: Secondary | ICD-10-CM | POA: Diagnosis not present

## 2017-06-28 DIAGNOSIS — N186 End stage renal disease: Secondary | ICD-10-CM | POA: Diagnosis not present

## 2017-06-30 DIAGNOSIS — N186 End stage renal disease: Secondary | ICD-10-CM | POA: Diagnosis not present

## 2017-06-30 DIAGNOSIS — Z7682 Awaiting organ transplant status: Secondary | ICD-10-CM | POA: Diagnosis not present

## 2017-06-30 DIAGNOSIS — I120 Hypertensive chronic kidney disease with stage 5 chronic kidney disease or end stage renal disease: Secondary | ICD-10-CM | POA: Diagnosis not present

## 2017-06-30 DIAGNOSIS — Z01818 Encounter for other preprocedural examination: Secondary | ICD-10-CM | POA: Diagnosis not present

## 2017-06-30 DIAGNOSIS — I499 Cardiac arrhythmia, unspecified: Secondary | ICD-10-CM | POA: Diagnosis not present

## 2017-06-30 DIAGNOSIS — I10 Essential (primary) hypertension: Secondary | ICD-10-CM | POA: Diagnosis not present

## 2017-07-02 DIAGNOSIS — F410 Panic disorder [episodic paroxysmal anxiety] without agoraphobia: Secondary | ICD-10-CM | POA: Diagnosis not present

## 2017-07-02 DIAGNOSIS — M48062 Spinal stenosis, lumbar region with neurogenic claudication: Secondary | ICD-10-CM | POA: Diagnosis not present

## 2017-07-02 DIAGNOSIS — M4712 Other spondylosis with myelopathy, cervical region: Secondary | ICD-10-CM | POA: Diagnosis not present

## 2017-07-02 DIAGNOSIS — N186 End stage renal disease: Secondary | ICD-10-CM | POA: Diagnosis not present

## 2017-07-02 DIAGNOSIS — I120 Hypertensive chronic kidney disease with stage 5 chronic kidney disease or end stage renal disease: Secondary | ICD-10-CM | POA: Diagnosis not present

## 2017-07-02 DIAGNOSIS — M5030 Other cervical disc degeneration, unspecified cervical region: Secondary | ICD-10-CM | POA: Diagnosis not present

## 2017-07-03 DIAGNOSIS — Z992 Dependence on renal dialysis: Secondary | ICD-10-CM | POA: Diagnosis not present

## 2017-07-03 DIAGNOSIS — N186 End stage renal disease: Secondary | ICD-10-CM | POA: Diagnosis not present

## 2017-07-04 DIAGNOSIS — R9431 Abnormal electrocardiogram [ECG] [EKG]: Secondary | ICD-10-CM | POA: Diagnosis not present

## 2017-07-04 DIAGNOSIS — H2589 Other age-related cataract: Secondary | ICD-10-CM | POA: Diagnosis not present

## 2017-07-04 DIAGNOSIS — I10 Essential (primary) hypertension: Secondary | ICD-10-CM | POA: Diagnosis not present

## 2017-07-05 DIAGNOSIS — N186 End stage renal disease: Secondary | ICD-10-CM | POA: Diagnosis not present

## 2017-07-05 DIAGNOSIS — F410 Panic disorder [episodic paroxysmal anxiety] without agoraphobia: Secondary | ICD-10-CM | POA: Diagnosis not present

## 2017-07-05 DIAGNOSIS — M4712 Other spondylosis with myelopathy, cervical region: Secondary | ICD-10-CM | POA: Diagnosis not present

## 2017-07-05 DIAGNOSIS — I120 Hypertensive chronic kidney disease with stage 5 chronic kidney disease or end stage renal disease: Secondary | ICD-10-CM | POA: Diagnosis not present

## 2017-07-05 DIAGNOSIS — M48062 Spinal stenosis, lumbar region with neurogenic claudication: Secondary | ICD-10-CM | POA: Diagnosis not present

## 2017-07-05 DIAGNOSIS — M5030 Other cervical disc degeneration, unspecified cervical region: Secondary | ICD-10-CM | POA: Diagnosis not present

## 2017-07-09 DIAGNOSIS — I120 Hypertensive chronic kidney disease with stage 5 chronic kidney disease or end stage renal disease: Secondary | ICD-10-CM | POA: Diagnosis not present

## 2017-07-09 DIAGNOSIS — M48062 Spinal stenosis, lumbar region with neurogenic claudication: Secondary | ICD-10-CM | POA: Diagnosis not present

## 2017-07-09 DIAGNOSIS — M5030 Other cervical disc degeneration, unspecified cervical region: Secondary | ICD-10-CM | POA: Diagnosis not present

## 2017-07-09 DIAGNOSIS — M4712 Other spondylosis with myelopathy, cervical region: Secondary | ICD-10-CM | POA: Diagnosis not present

## 2017-07-09 DIAGNOSIS — N186 End stage renal disease: Secondary | ICD-10-CM | POA: Diagnosis not present

## 2017-07-09 DIAGNOSIS — F410 Panic disorder [episodic paroxysmal anxiety] without agoraphobia: Secondary | ICD-10-CM | POA: Diagnosis not present

## 2017-07-12 DIAGNOSIS — M4712 Other spondylosis with myelopathy, cervical region: Secondary | ICD-10-CM | POA: Diagnosis not present

## 2017-07-12 DIAGNOSIS — N186 End stage renal disease: Secondary | ICD-10-CM | POA: Diagnosis not present

## 2017-07-12 DIAGNOSIS — M5030 Other cervical disc degeneration, unspecified cervical region: Secondary | ICD-10-CM | POA: Diagnosis not present

## 2017-07-12 DIAGNOSIS — M48062 Spinal stenosis, lumbar region with neurogenic claudication: Secondary | ICD-10-CM | POA: Diagnosis not present

## 2017-07-12 DIAGNOSIS — I120 Hypertensive chronic kidney disease with stage 5 chronic kidney disease or end stage renal disease: Secondary | ICD-10-CM | POA: Diagnosis not present

## 2017-07-12 DIAGNOSIS — F410 Panic disorder [episodic paroxysmal anxiety] without agoraphobia: Secondary | ICD-10-CM | POA: Diagnosis not present

## 2017-07-23 DIAGNOSIS — N186 End stage renal disease: Secondary | ICD-10-CM | POA: Diagnosis not present

## 2017-07-23 DIAGNOSIS — M5030 Other cervical disc degeneration, unspecified cervical region: Secondary | ICD-10-CM | POA: Diagnosis not present

## 2017-07-23 DIAGNOSIS — I120 Hypertensive chronic kidney disease with stage 5 chronic kidney disease or end stage renal disease: Secondary | ICD-10-CM | POA: Diagnosis not present

## 2017-07-23 DIAGNOSIS — F410 Panic disorder [episodic paroxysmal anxiety] without agoraphobia: Secondary | ICD-10-CM | POA: Diagnosis not present

## 2017-07-23 DIAGNOSIS — M48062 Spinal stenosis, lumbar region with neurogenic claudication: Secondary | ICD-10-CM | POA: Diagnosis not present

## 2017-07-23 DIAGNOSIS — M4712 Other spondylosis with myelopathy, cervical region: Secondary | ICD-10-CM | POA: Diagnosis not present

## 2017-07-25 DIAGNOSIS — M48062 Spinal stenosis, lumbar region with neurogenic claudication: Secondary | ICD-10-CM | POA: Diagnosis not present

## 2017-07-25 DIAGNOSIS — K589 Irritable bowel syndrome without diarrhea: Secondary | ICD-10-CM | POA: Diagnosis not present

## 2017-07-25 DIAGNOSIS — M5030 Other cervical disc degeneration, unspecified cervical region: Secondary | ICD-10-CM | POA: Diagnosis not present

## 2017-07-25 DIAGNOSIS — J189 Pneumonia, unspecified organism: Secondary | ICD-10-CM | POA: Diagnosis not present

## 2017-07-25 DIAGNOSIS — R7881 Bacteremia: Secondary | ICD-10-CM | POA: Diagnosis not present

## 2017-07-25 DIAGNOSIS — Z9181 History of falling: Secondary | ICD-10-CM | POA: Diagnosis not present

## 2017-07-25 DIAGNOSIS — B955 Unspecified streptococcus as the cause of diseases classified elsewhere: Secondary | ICD-10-CM | POA: Diagnosis not present

## 2017-07-25 DIAGNOSIS — D509 Iron deficiency anemia, unspecified: Secondary | ICD-10-CM | POA: Diagnosis not present

## 2017-07-25 DIAGNOSIS — G4733 Obstructive sleep apnea (adult) (pediatric): Secondary | ICD-10-CM | POA: Diagnosis not present

## 2017-07-25 DIAGNOSIS — Z85528 Personal history of other malignant neoplasm of kidney: Secondary | ICD-10-CM | POA: Diagnosis not present

## 2017-07-25 DIAGNOSIS — N186 End stage renal disease: Secondary | ICD-10-CM | POA: Diagnosis not present

## 2017-07-25 DIAGNOSIS — K219 Gastro-esophageal reflux disease without esophagitis: Secondary | ICD-10-CM | POA: Diagnosis not present

## 2017-07-25 DIAGNOSIS — Z8673 Personal history of transient ischemic attack (TIA), and cerebral infarction without residual deficits: Secondary | ICD-10-CM | POA: Diagnosis not present

## 2017-07-25 DIAGNOSIS — F410 Panic disorder [episodic paroxysmal anxiety] without agoraphobia: Secondary | ICD-10-CM | POA: Diagnosis not present

## 2017-07-25 DIAGNOSIS — M4712 Other spondylosis with myelopathy, cervical region: Secondary | ICD-10-CM | POA: Diagnosis not present

## 2017-07-25 DIAGNOSIS — M8580 Other specified disorders of bone density and structure, unspecified site: Secondary | ICD-10-CM | POA: Diagnosis not present

## 2017-07-25 DIAGNOSIS — F329 Major depressive disorder, single episode, unspecified: Secondary | ICD-10-CM | POA: Diagnosis not present

## 2017-07-25 DIAGNOSIS — Z992 Dependence on renal dialysis: Secondary | ICD-10-CM | POA: Diagnosis not present

## 2017-07-25 DIAGNOSIS — F4322 Adjustment disorder with anxiety: Secondary | ICD-10-CM | POA: Diagnosis not present

## 2017-07-25 DIAGNOSIS — I120 Hypertensive chronic kidney disease with stage 5 chronic kidney disease or end stage renal disease: Secondary | ICD-10-CM | POA: Diagnosis not present

## 2017-07-26 DIAGNOSIS — M48062 Spinal stenosis, lumbar region with neurogenic claudication: Secondary | ICD-10-CM | POA: Diagnosis not present

## 2017-07-26 DIAGNOSIS — N186 End stage renal disease: Secondary | ICD-10-CM | POA: Diagnosis not present

## 2017-07-26 DIAGNOSIS — I120 Hypertensive chronic kidney disease with stage 5 chronic kidney disease or end stage renal disease: Secondary | ICD-10-CM | POA: Diagnosis not present

## 2017-07-26 DIAGNOSIS — J189 Pneumonia, unspecified organism: Secondary | ICD-10-CM | POA: Diagnosis not present

## 2017-07-26 DIAGNOSIS — B955 Unspecified streptococcus as the cause of diseases classified elsewhere: Secondary | ICD-10-CM | POA: Diagnosis not present

## 2017-07-26 DIAGNOSIS — R7881 Bacteremia: Secondary | ICD-10-CM | POA: Diagnosis not present

## 2017-07-27 DIAGNOSIS — N186 End stage renal disease: Secondary | ICD-10-CM | POA: Diagnosis not present

## 2017-07-27 DIAGNOSIS — M48062 Spinal stenosis, lumbar region with neurogenic claudication: Secondary | ICD-10-CM | POA: Diagnosis not present

## 2017-07-27 DIAGNOSIS — J189 Pneumonia, unspecified organism: Secondary | ICD-10-CM | POA: Diagnosis not present

## 2017-07-27 DIAGNOSIS — R7881 Bacteremia: Secondary | ICD-10-CM | POA: Diagnosis not present

## 2017-07-27 DIAGNOSIS — I120 Hypertensive chronic kidney disease with stage 5 chronic kidney disease or end stage renal disease: Secondary | ICD-10-CM | POA: Diagnosis not present

## 2017-07-27 DIAGNOSIS — B955 Unspecified streptococcus as the cause of diseases classified elsewhere: Secondary | ICD-10-CM | POA: Diagnosis not present

## 2017-07-28 DIAGNOSIS — N186 End stage renal disease: Secondary | ICD-10-CM | POA: Diagnosis not present

## 2017-07-28 DIAGNOSIS — B955 Unspecified streptococcus as the cause of diseases classified elsewhere: Secondary | ICD-10-CM | POA: Diagnosis not present

## 2017-07-28 DIAGNOSIS — R7881 Bacteremia: Secondary | ICD-10-CM | POA: Diagnosis not present

## 2017-07-28 DIAGNOSIS — I120 Hypertensive chronic kidney disease with stage 5 chronic kidney disease or end stage renal disease: Secondary | ICD-10-CM | POA: Diagnosis not present

## 2017-07-28 DIAGNOSIS — J189 Pneumonia, unspecified organism: Secondary | ICD-10-CM | POA: Diagnosis not present

## 2017-07-28 DIAGNOSIS — M48062 Spinal stenosis, lumbar region with neurogenic claudication: Secondary | ICD-10-CM | POA: Diagnosis not present

## 2017-08-02 DIAGNOSIS — M48062 Spinal stenosis, lumbar region with neurogenic claudication: Secondary | ICD-10-CM | POA: Diagnosis not present

## 2017-08-02 DIAGNOSIS — J189 Pneumonia, unspecified organism: Secondary | ICD-10-CM | POA: Diagnosis not present

## 2017-08-02 DIAGNOSIS — B955 Unspecified streptococcus as the cause of diseases classified elsewhere: Secondary | ICD-10-CM | POA: Diagnosis not present

## 2017-08-02 DIAGNOSIS — N186 End stage renal disease: Secondary | ICD-10-CM | POA: Diagnosis not present

## 2017-08-02 DIAGNOSIS — I120 Hypertensive chronic kidney disease with stage 5 chronic kidney disease or end stage renal disease: Secondary | ICD-10-CM | POA: Diagnosis not present

## 2017-08-02 DIAGNOSIS — R7881 Bacteremia: Secondary | ICD-10-CM | POA: Diagnosis not present

## 2017-08-03 DIAGNOSIS — N186 End stage renal disease: Secondary | ICD-10-CM | POA: Diagnosis not present

## 2017-08-03 DIAGNOSIS — Z992 Dependence on renal dialysis: Secondary | ICD-10-CM | POA: Diagnosis not present

## 2017-08-04 DIAGNOSIS — B955 Unspecified streptococcus as the cause of diseases classified elsewhere: Secondary | ICD-10-CM | POA: Diagnosis not present

## 2017-08-04 DIAGNOSIS — J189 Pneumonia, unspecified organism: Secondary | ICD-10-CM | POA: Diagnosis not present

## 2017-08-04 DIAGNOSIS — M48062 Spinal stenosis, lumbar region with neurogenic claudication: Secondary | ICD-10-CM | POA: Diagnosis not present

## 2017-08-04 DIAGNOSIS — I120 Hypertensive chronic kidney disease with stage 5 chronic kidney disease or end stage renal disease: Secondary | ICD-10-CM | POA: Diagnosis not present

## 2017-08-04 DIAGNOSIS — R7881 Bacteremia: Secondary | ICD-10-CM | POA: Diagnosis not present

## 2017-08-04 DIAGNOSIS — N186 End stage renal disease: Secondary | ICD-10-CM | POA: Diagnosis not present

## 2017-08-06 DIAGNOSIS — B955 Unspecified streptococcus as the cause of diseases classified elsewhere: Secondary | ICD-10-CM | POA: Diagnosis not present

## 2017-08-06 DIAGNOSIS — I120 Hypertensive chronic kidney disease with stage 5 chronic kidney disease or end stage renal disease: Secondary | ICD-10-CM | POA: Diagnosis not present

## 2017-08-06 DIAGNOSIS — N186 End stage renal disease: Secondary | ICD-10-CM | POA: Diagnosis not present

## 2017-08-06 DIAGNOSIS — M48062 Spinal stenosis, lumbar region with neurogenic claudication: Secondary | ICD-10-CM | POA: Diagnosis not present

## 2017-08-06 DIAGNOSIS — R7881 Bacteremia: Secondary | ICD-10-CM | POA: Diagnosis not present

## 2017-08-06 DIAGNOSIS — J189 Pneumonia, unspecified organism: Secondary | ICD-10-CM | POA: Diagnosis not present

## 2017-08-09 DIAGNOSIS — I120 Hypertensive chronic kidney disease with stage 5 chronic kidney disease or end stage renal disease: Secondary | ICD-10-CM | POA: Diagnosis not present

## 2017-08-09 DIAGNOSIS — B955 Unspecified streptococcus as the cause of diseases classified elsewhere: Secondary | ICD-10-CM | POA: Diagnosis not present

## 2017-08-09 DIAGNOSIS — M48062 Spinal stenosis, lumbar region with neurogenic claudication: Secondary | ICD-10-CM | POA: Diagnosis not present

## 2017-08-09 DIAGNOSIS — N186 End stage renal disease: Secondary | ICD-10-CM | POA: Diagnosis not present

## 2017-08-09 DIAGNOSIS — J189 Pneumonia, unspecified organism: Secondary | ICD-10-CM | POA: Diagnosis not present

## 2017-08-09 DIAGNOSIS — R7881 Bacteremia: Secondary | ICD-10-CM | POA: Diagnosis not present

## 2017-08-11 DIAGNOSIS — J189 Pneumonia, unspecified organism: Secondary | ICD-10-CM | POA: Diagnosis not present

## 2017-08-11 DIAGNOSIS — R7881 Bacteremia: Secondary | ICD-10-CM | POA: Diagnosis not present

## 2017-08-11 DIAGNOSIS — N186 End stage renal disease: Secondary | ICD-10-CM | POA: Diagnosis not present

## 2017-08-11 DIAGNOSIS — B955 Unspecified streptococcus as the cause of diseases classified elsewhere: Secondary | ICD-10-CM | POA: Diagnosis not present

## 2017-08-11 DIAGNOSIS — I120 Hypertensive chronic kidney disease with stage 5 chronic kidney disease or end stage renal disease: Secondary | ICD-10-CM | POA: Diagnosis not present

## 2017-08-11 DIAGNOSIS — M48062 Spinal stenosis, lumbar region with neurogenic claudication: Secondary | ICD-10-CM | POA: Diagnosis not present

## 2017-08-16 DIAGNOSIS — J189 Pneumonia, unspecified organism: Secondary | ICD-10-CM | POA: Diagnosis not present

## 2017-08-16 DIAGNOSIS — N186 End stage renal disease: Secondary | ICD-10-CM | POA: Diagnosis not present

## 2017-08-16 DIAGNOSIS — R7881 Bacteremia: Secondary | ICD-10-CM | POA: Diagnosis not present

## 2017-08-16 DIAGNOSIS — I120 Hypertensive chronic kidney disease with stage 5 chronic kidney disease or end stage renal disease: Secondary | ICD-10-CM | POA: Diagnosis not present

## 2017-08-16 DIAGNOSIS — M48062 Spinal stenosis, lumbar region with neurogenic claudication: Secondary | ICD-10-CM | POA: Diagnosis not present

## 2017-08-16 DIAGNOSIS — B955 Unspecified streptococcus as the cause of diseases classified elsewhere: Secondary | ICD-10-CM | POA: Diagnosis not present

## 2017-08-20 DIAGNOSIS — J189 Pneumonia, unspecified organism: Secondary | ICD-10-CM | POA: Diagnosis not present

## 2017-08-20 DIAGNOSIS — I120 Hypertensive chronic kidney disease with stage 5 chronic kidney disease or end stage renal disease: Secondary | ICD-10-CM | POA: Diagnosis not present

## 2017-08-20 DIAGNOSIS — R7881 Bacteremia: Secondary | ICD-10-CM | POA: Diagnosis not present

## 2017-08-20 DIAGNOSIS — N186 End stage renal disease: Secondary | ICD-10-CM | POA: Diagnosis not present

## 2017-08-20 DIAGNOSIS — B955 Unspecified streptococcus as the cause of diseases classified elsewhere: Secondary | ICD-10-CM | POA: Diagnosis not present

## 2017-08-20 DIAGNOSIS — M48062 Spinal stenosis, lumbar region with neurogenic claudication: Secondary | ICD-10-CM | POA: Diagnosis not present

## 2017-08-23 DIAGNOSIS — N186 End stage renal disease: Secondary | ICD-10-CM | POA: Diagnosis not present

## 2017-08-23 DIAGNOSIS — R7881 Bacteremia: Secondary | ICD-10-CM | POA: Diagnosis not present

## 2017-08-23 DIAGNOSIS — M48062 Spinal stenosis, lumbar region with neurogenic claudication: Secondary | ICD-10-CM | POA: Diagnosis not present

## 2017-08-23 DIAGNOSIS — I120 Hypertensive chronic kidney disease with stage 5 chronic kidney disease or end stage renal disease: Secondary | ICD-10-CM | POA: Diagnosis not present

## 2017-08-23 DIAGNOSIS — J189 Pneumonia, unspecified organism: Secondary | ICD-10-CM | POA: Diagnosis not present

## 2017-08-23 DIAGNOSIS — B955 Unspecified streptococcus as the cause of diseases classified elsewhere: Secondary | ICD-10-CM | POA: Diagnosis not present

## 2017-08-25 DIAGNOSIS — J189 Pneumonia, unspecified organism: Secondary | ICD-10-CM | POA: Diagnosis not present

## 2017-08-25 DIAGNOSIS — R7881 Bacteremia: Secondary | ICD-10-CM | POA: Diagnosis not present

## 2017-08-25 DIAGNOSIS — I120 Hypertensive chronic kidney disease with stage 5 chronic kidney disease or end stage renal disease: Secondary | ICD-10-CM | POA: Diagnosis not present

## 2017-08-25 DIAGNOSIS — M48062 Spinal stenosis, lumbar region with neurogenic claudication: Secondary | ICD-10-CM | POA: Diagnosis not present

## 2017-08-25 DIAGNOSIS — N186 End stage renal disease: Secondary | ICD-10-CM | POA: Diagnosis not present

## 2017-08-25 DIAGNOSIS — B955 Unspecified streptococcus as the cause of diseases classified elsewhere: Secondary | ICD-10-CM | POA: Diagnosis not present

## 2017-08-30 DIAGNOSIS — M48062 Spinal stenosis, lumbar region with neurogenic claudication: Secondary | ICD-10-CM | POA: Diagnosis not present

## 2017-08-30 DIAGNOSIS — N186 End stage renal disease: Secondary | ICD-10-CM | POA: Diagnosis not present

## 2017-08-30 DIAGNOSIS — J189 Pneumonia, unspecified organism: Secondary | ICD-10-CM | POA: Diagnosis not present

## 2017-08-30 DIAGNOSIS — R7881 Bacteremia: Secondary | ICD-10-CM | POA: Diagnosis not present

## 2017-08-30 DIAGNOSIS — I120 Hypertensive chronic kidney disease with stage 5 chronic kidney disease or end stage renal disease: Secondary | ICD-10-CM | POA: Diagnosis not present

## 2017-08-30 DIAGNOSIS — B955 Unspecified streptococcus as the cause of diseases classified elsewhere: Secondary | ICD-10-CM | POA: Diagnosis not present

## 2017-09-01 DIAGNOSIS — B955 Unspecified streptococcus as the cause of diseases classified elsewhere: Secondary | ICD-10-CM | POA: Diagnosis not present

## 2017-09-01 DIAGNOSIS — R7881 Bacteremia: Secondary | ICD-10-CM | POA: Diagnosis not present

## 2017-09-01 DIAGNOSIS — N186 End stage renal disease: Secondary | ICD-10-CM | POA: Diagnosis not present

## 2017-09-01 DIAGNOSIS — I120 Hypertensive chronic kidney disease with stage 5 chronic kidney disease or end stage renal disease: Secondary | ICD-10-CM | POA: Diagnosis not present

## 2017-09-01 DIAGNOSIS — J189 Pneumonia, unspecified organism: Secondary | ICD-10-CM | POA: Diagnosis not present

## 2017-09-01 DIAGNOSIS — M48062 Spinal stenosis, lumbar region with neurogenic claudication: Secondary | ICD-10-CM | POA: Diagnosis not present

## 2017-09-03 DIAGNOSIS — N186 End stage renal disease: Secondary | ICD-10-CM | POA: Diagnosis not present

## 2017-09-03 DIAGNOSIS — Z992 Dependence on renal dialysis: Secondary | ICD-10-CM | POA: Diagnosis not present

## 2017-09-06 DIAGNOSIS — J189 Pneumonia, unspecified organism: Secondary | ICD-10-CM | POA: Diagnosis not present

## 2017-09-06 DIAGNOSIS — M48062 Spinal stenosis, lumbar region with neurogenic claudication: Secondary | ICD-10-CM | POA: Diagnosis not present

## 2017-09-06 DIAGNOSIS — R7881 Bacteremia: Secondary | ICD-10-CM | POA: Diagnosis not present

## 2017-09-06 DIAGNOSIS — I120 Hypertensive chronic kidney disease with stage 5 chronic kidney disease or end stage renal disease: Secondary | ICD-10-CM | POA: Diagnosis not present

## 2017-09-06 DIAGNOSIS — N186 End stage renal disease: Secondary | ICD-10-CM | POA: Diagnosis not present

## 2017-09-06 DIAGNOSIS — B955 Unspecified streptococcus as the cause of diseases classified elsewhere: Secondary | ICD-10-CM | POA: Diagnosis not present

## 2017-09-10 ENCOUNTER — Other Ambulatory Visit: Payer: Self-pay

## 2017-09-10 ENCOUNTER — Encounter: Payer: Self-pay | Admitting: Gynecology

## 2017-09-10 ENCOUNTER — Ambulatory Visit
Admission: EM | Admit: 2017-09-10 | Discharge: 2017-09-10 | Disposition: A | Discharge status: Home / Self Care | Payer: Medicare Other | Attending: Family Medicine | Admitting: Family Medicine

## 2017-09-10 DIAGNOSIS — H1132 Conjunctival hemorrhage, left eye: Secondary | ICD-10-CM

## 2017-09-10 NOTE — ED Provider Notes (Signed)
[facility]    CSN: [id number] Arrival date & time: 09/10/17  1301  History   Chief Complaint Chief Complaint  Patient presents with  . Eye Problem   HPI  62 year old male presents with the above complaint.  Patient reports that he developed left eye redness yesterday while he was at dialysis.  Reports some eye pressure and dizziness.  BP borderline low today.  Has had recent cataract surgery.  No visual disturbance.  No drainage.  No known inciting factor.  No known exacerbating relieving factors.  No other associated symptoms.  No other complaints.   PMH, Surgical Hx, Family Hx, Social History reviewed as below.  Past Medical History:  Diagnosis Date  . Bullous dermatosis   . Dialysis patient ([place])   . Dysphagia   . FSGS (focal segmental glomerulosclerosis)   . Hyperlipidemia   . Spinal stenosis, cervical region     Patient Active Problem List   Diagnosis Date Noted  . Acalculous cholecystitis   . Leukocytosis 07/12/2016  . Hypotension 07/12/2016    Past Surgical History:  Procedure Laterality Date  . CARPAL TUNNEL RELEASE    . SPINAL FUSION  2018  . spleen  2015   removed    Home Medications    Prior to Admission medications   Medication Sig Start Date End Date Taking? Authorizing Provider  acetaminophen (TYLENOL) 325 MG tablet Take 650 mg by mouth every 8 (eight) hours as needed (pain or fever).    Yes [provider]  amLODipine (NORVASC) 5 MG tablet Take 5 mg by mouth daily. Takes only M-W-F-Sun   Yes [provider]  atorvastatin (LIPITOR) 40 MG tablet Take 20 mg by mouth daily.    Yes [provider]  b complex-vitamin c-folic acid (NEPHRO-VITE) 0.8 MG TABS tablet Take 1 tablet by mouth at bedtime.   Yes [provider]  betamethasone dipropionate (DIPROLENE) 0.05 % ointment Apply topically 2 (two) times daily.   Yes [provider]  camphor-menthol Timoteo Ace) lotion Apply 1 application topically as  needed for itching.   Yes [provider]  Carboxymethylcellul-Glycerin (REFRESH OPTIVE SENSITIVE OP) Place 1 drop into both eyes every 4 (four) hours.   Yes [provider]  cetirizine (ZYRTEC) 10 MG tablet Take 10 mg by mouth daily.   Yes [provider]  cinacalcet (SENSIPAR) 90 MG tablet Take 90 mg by mouth daily.   Yes [provider]  cycloSPORINE (RESTASIS) 0.05 % ophthalmic emulsion Place 1 drop into both eyes 2 (two) times daily.   Yes [provider]  diphenhydrAMINE (BENADRYL) 25 mg capsule Take 25 mg by mouth 2 (two) times daily as needed.   Yes [provider]  docusate sodium (COLACE) 100 MG capsule Take 100 mg by mouth daily.   Yes [provider]  dorzolamidel-timolol (COSOPT) 22.3-6.8 MG/ML SOLN ophthalmic solution 1 drop 2 (two) times daily.   Yes [provider]  doxycycline (VIBRAMYCIN) 100 MG capsule Take 100 mg by mouth 2 (two) times daily.   Yes [provider]  fluorometholone (EFLONE) 0.1 % ophthalmic suspension 1 drop 4 (four) times daily.   Yes [provider]  gabapentin (NEURONTIN) 300 MG capsule Take 100 mg by mouth at bedtime.   Yes [provider]  hydrALAZINE (APRESOLINE) 10 MG tablet Take 10 mg by mouth every 4 (four) hours as needed (systolic BP >373SKAJ).    Yes [provider]  hydrOXYzine (VISTARIL) 25 MG capsule Take 25 mg by  mouth 3 (three) times daily as needed.   Yes [provider]  ketoconazole (NIZORAL) 2 % shampoo Apply 1 application topically 2 (two) times a week.   Yes [provider]  ketorolac (ACULAR) 0.4 % SOLN 1 drop 4 (four) times daily.   Yes [provider]  latanoprost (XALATAN) 0.005 % ophthalmic solution Place 1 drop into both eyes at bedtime.   Yes [provider]  losartan (COZAAR) 50 MG tablet Take 50 mg by mouth daily.   Yes [provider]  Melatonin 5 MG CAPS Take by mouth.   Yes  [provider]  methocarbamol (ROBAXIN) 500 MG tablet Take 500 mg by mouth 3 (three) times daily.    Yes [provider]  mirtazapine (REMERON) 15 MG tablet Take 15 mg by mouth at bedtime.   Yes [provider]  mupirocin ointment (BACTROBAN) 2 % Apply 1 application topically 3 (three) times daily. 12/16/16  Yes Lorin Picket, PA-C  naloxone [facility]) nasal spray 4 mg/0.1 mL Place 1 spray into the nose.   Yes [provider]  ofloxacin (OCUFLOX) 0.3 % ophthalmic solution 1 drop 4 (four) times daily.   Yes [provider]  omeprazole (PRILOSEC) 20 MG capsule Take 20 mg by mouth 2 (two) times daily before a meal.   Yes [provider]  oxycodone (OXY-IR) 5 MG capsule Take 5-10 mg by mouth every 4 (four) hours as needed for pain.    Yes [provider]  prednisoLONE acetate (PRED FORTE) 1 % ophthalmic suspension 1 drop 4 (four) times daily.   Yes [provider]  predniSONE (DELTASONE) 50 MG tablet Take 50 mg by mouth daily with breakfast.   Yes [provider]  sertraline (ZOLOFT) 50 MG tablet Take 150 mg by mouth at bedtime.   Yes [provider]  sevelamer carbonate (RENVELA) 0.8 g PACK packet Take 0.8 g by mouth 3 (three) times daily with meals.   Yes [provider]  simethicone (MYLICON) 80 MG chewable tablet Chew 80 mg by mouth 3 (three) times daily.   Yes [provider]  terbinafine (LAMISIL) 1 % cream Apply 1 application topically 2 (two) times daily.   Yes [provider]  timolol (BETIMOL) 0.5 % ophthalmic solution Place 1 drop into both eyes daily.   Yes [provider]  vancomycin IVPB Inject into the vein.   Yes [provider]  bisacodyl (DULCOLAX) 10 MG suppository Place 10 mg rectally daily as needed for moderate constipation.    [provider]  calcium acetate (PHOSLO) 667 MG capsule Take 1,334 mg by mouth 3 (three) times daily with  meals.     [provider]  carvedilol (COREG) 25 MG tablet Take 25 mg by mouth 2 (two) times daily with a meal.     [provider]  clobetasol ointment (TEMOVATE) 9.37 % Apply 1 application topically 2 (two) times daily.    [provider]  lactulose (CHRONULAC) 10 GM/15ML solution Take by mouth 3 (three) times daily.    [provider]  mupirocin cream (BACTROBAN) 2 % Apply 1 application topically 2 (two) times daily. 09/30/16   Frederich Cha, MD  naproxen (NAPROSYN) 500 MG tablet Take 500 mg by mouth 2 (two) times daily with a meal.    [provider]  polyvinyl alcohol (LIQUIFILM TEARS) 1.4 % ophthalmic solution Place 1 drop into both eyes 4 (four) times daily as needed for dry eyes.  [provider]  Psyllium (KONSYL-D PO) Take 5 mLs by mouth 2 (two) times daily.    [provider]  senna-docusate (SENOKOT-S) 8.6-50 MG tablet Take 1 tablet by mouth 2 (two) times daily as needed (to prevent constipation; scheduled until bowel movement).     [provider]  traZODone (DESYREL) 100 MG tablet Take 100 mg by mouth at bedtime.    [provider]    Family History Family History  Problem Relation Age of Onset  . Cancer Mother   . COPD Mother   . Heart disease Father   . Parkinson's disease Father    Social History Social History   Tobacco Use  . Smoking status: Never Smoker  . Smokeless tobacco: Never Used  Substance Use Topics  . Alcohol use: No  . Drug use: No    Allergies   Albumin (human); Aloe; Iodinated diagnostic agents; Iron dextran; Metoprolol tartrate; Penicillins; Quinine sulfate [quinine]; Rabeprazole; Sulfa antibiotics; and Trimethoprim   Review of Systems Review of Systems  Eyes: Positive for redness. Negative for visual disturbance.  Neurological: Positive for dizziness.   Physical Exam Triage Vital Signs ED Triage Vitals  Enc Vitals Group     BP 09/10/17 1326 110/80     Pulse  Rate 09/10/17 1326 70     Resp 09/10/17 1326 16     Temp 09/10/17 1326 98.5 F (36.9 C)     Temp Source 09/10/17 1326 Oral     SpO2 09/10/17 1326 99 %     Weight 09/10/17 1328 160 lb (72.6 kg)     Height 09/10/17 1328 5\' 8"  (1.727 m)     Head Circumference --      Peak Flow --      Pain Score 09/10/17 1326 0     Pain Loc --      Pain Edu? --      Excl. in [place]? --    Updated Vital Signs BP 110/80 (BP Location: Left Arm)   Pulse 70   Temp 98.5 F (36.9 C) (Oral)   Resp 16   Ht 5\' 8"  (1.727 m)   Wt 72.6 kg   SpO2 99%   BMI 24.33 kg/m   Visual Acuity Right Eye Distance:   Left Eye Distance:   Bilateral Distance:    Right Eye Near:   Left Eye Near:    Bilateral Near:     Physical Exam  Constitutional: He is oriented to person, place, and time. He appears well-developed. No distress.  HENT:  Head: Normocephalic and atraumatic.  Eyes:  Left eye redness medially.  Appears to be consistent with a subconjunctival hemorrhage.  No drainage.  Cardiovascular: Normal rate and regular rhythm.  Pulmonary/Chest: Effort normal. No respiratory distress.  Neurological: He is alert and oriented to person, place, and time.  Psychiatric: He has a normal mood and affect. His behavior is normal.  Nursing note and vitals reviewed.  UC Treatments / Results  Labs (all labs ordered are listed, but only abnormal results are displayed) Labs Reviewed - No data to display  EKG None  Radiology No results found.  Procedures Procedures (including critical care time)  Medications Ordered in UC Medications - No data to display  Initial Impression / Assessment and Plan / UC Course  I have reviewed the triage vital signs and the nursing notes.  Pertinent labs & imaging results that were available during my care of the patient were reviewed by me and considered in my medical  decision making (see chart for details).    62 year old male presents with a conjunctival hemorrhage.  Reassurance  provided.  Supportive care.  If any additional symptoms develop or he has visual disturbance he should call his ophthalmologist.  Final Clinical Impressions(s) / UC Diagnoses   Final diagnoses:  Subconjunctival hemorrhage of left eye     Discharge Instructions     Inform eye doctor on Monday.  If vision changes or other symptoms arise, please call the on call the neurologist.  Continue his regular eyedrops.  No need to worry at this time.  Take care  Dr. Lacinda Axon  Dr   ED Prescriptions    None     Controlled Substance Prescriptions [place] Controlled Substance Registry consulted? Not Applicable   Coral Spikes, DO 09/10/17 1437

## 2017-09-10 NOTE — ED Triage Notes (Signed)
Per daughter , dad with left eye redness / blood shot/pressure x yesterday while he was at his dialysis appointment. Per patient s/p cataract surgery in his left eye x 3 months ago.

## 2017-09-10 NOTE — Discharge Instructions (Addendum)
Inform eye doctor on Monday.  If vision changes or other symptoms arise, please call the on call the neurologist.  Continue his regular eyedrops.  No need to worry at this time.  Take care  Dr. Lacinda Axon  Dr

## 2017-09-13 DIAGNOSIS — J189 Pneumonia, unspecified organism: Secondary | ICD-10-CM | POA: Diagnosis not present

## 2017-09-13 DIAGNOSIS — M48062 Spinal stenosis, lumbar region with neurogenic claudication: Secondary | ICD-10-CM | POA: Diagnosis not present

## 2017-09-13 DIAGNOSIS — N186 End stage renal disease: Secondary | ICD-10-CM | POA: Diagnosis not present

## 2017-09-13 DIAGNOSIS — R7881 Bacteremia: Secondary | ICD-10-CM | POA: Diagnosis not present

## 2017-09-13 DIAGNOSIS — B955 Unspecified streptococcus as the cause of diseases classified elsewhere: Secondary | ICD-10-CM | POA: Diagnosis not present

## 2017-09-13 DIAGNOSIS — I120 Hypertensive chronic kidney disease with stage 5 chronic kidney disease or end stage renal disease: Secondary | ICD-10-CM | POA: Diagnosis not present

## 2017-09-15 DIAGNOSIS — I120 Hypertensive chronic kidney disease with stage 5 chronic kidney disease or end stage renal disease: Secondary | ICD-10-CM | POA: Diagnosis not present

## 2017-09-15 DIAGNOSIS — J189 Pneumonia, unspecified organism: Secondary | ICD-10-CM | POA: Diagnosis not present

## 2017-09-15 DIAGNOSIS — N186 End stage renal disease: Secondary | ICD-10-CM | POA: Diagnosis not present

## 2017-09-15 DIAGNOSIS — M48062 Spinal stenosis, lumbar region with neurogenic claudication: Secondary | ICD-10-CM | POA: Diagnosis not present

## 2017-09-15 DIAGNOSIS — B955 Unspecified streptococcus as the cause of diseases classified elsewhere: Secondary | ICD-10-CM | POA: Diagnosis not present

## 2017-09-15 DIAGNOSIS — R7881 Bacteremia: Secondary | ICD-10-CM | POA: Diagnosis not present

## 2017-09-20 DIAGNOSIS — N186 End stage renal disease: Secondary | ICD-10-CM | POA: Diagnosis not present

## 2017-09-20 DIAGNOSIS — J189 Pneumonia, unspecified organism: Secondary | ICD-10-CM | POA: Diagnosis not present

## 2017-09-20 DIAGNOSIS — M48062 Spinal stenosis, lumbar region with neurogenic claudication: Secondary | ICD-10-CM | POA: Diagnosis not present

## 2017-09-20 DIAGNOSIS — R7881 Bacteremia: Secondary | ICD-10-CM | POA: Diagnosis not present

## 2017-09-20 DIAGNOSIS — B955 Unspecified streptococcus as the cause of diseases classified elsewhere: Secondary | ICD-10-CM | POA: Diagnosis not present

## 2017-09-20 DIAGNOSIS — I120 Hypertensive chronic kidney disease with stage 5 chronic kidney disease or end stage renal disease: Secondary | ICD-10-CM | POA: Diagnosis not present

## 2017-10-03 DIAGNOSIS — N186 End stage renal disease: Secondary | ICD-10-CM | POA: Diagnosis not present

## 2017-10-03 DIAGNOSIS — Z992 Dependence on renal dialysis: Secondary | ICD-10-CM | POA: Diagnosis not present

## 2017-10-11 DIAGNOSIS — M6281 Muscle weakness (generalized): Secondary | ICD-10-CM | POA: Diagnosis not present

## 2017-10-18 DIAGNOSIS — M5030 Other cervical disc degeneration, unspecified cervical region: Secondary | ICD-10-CM | POA: Diagnosis not present

## 2017-10-18 DIAGNOSIS — G8929 Other chronic pain: Secondary | ICD-10-CM | POA: Diagnosis not present

## 2017-10-18 DIAGNOSIS — Z8673 Personal history of transient ischemic attack (TIA), and cerebral infarction without residual deficits: Secondary | ICD-10-CM | POA: Diagnosis not present

## 2017-10-18 DIAGNOSIS — F329 Major depressive disorder, single episode, unspecified: Secondary | ICD-10-CM | POA: Diagnosis not present

## 2017-10-18 DIAGNOSIS — E211 Secondary hyperparathyroidism, not elsewhere classified: Secondary | ICD-10-CM | POA: Diagnosis not present

## 2017-10-18 DIAGNOSIS — H269 Unspecified cataract: Secondary | ICD-10-CM | POA: Diagnosis not present

## 2017-10-18 DIAGNOSIS — M4712 Other spondylosis with myelopathy, cervical region: Secondary | ICD-10-CM | POA: Diagnosis not present

## 2017-10-18 DIAGNOSIS — N186 End stage renal disease: Secondary | ICD-10-CM | POA: Diagnosis not present

## 2017-10-18 DIAGNOSIS — N250 Renal osteodystrophy: Secondary | ICD-10-CM | POA: Diagnosis not present

## 2017-10-18 DIAGNOSIS — G4733 Obstructive sleep apnea (adult) (pediatric): Secondary | ICD-10-CM | POA: Diagnosis not present

## 2017-10-18 DIAGNOSIS — Z940 Kidney transplant status: Secondary | ICD-10-CM | POA: Diagnosis not present

## 2017-10-18 DIAGNOSIS — G629 Polyneuropathy, unspecified: Secondary | ICD-10-CM | POA: Diagnosis not present

## 2017-10-18 DIAGNOSIS — F4325 Adjustment disorder with mixed disturbance of emotions and conduct: Secondary | ICD-10-CM | POA: Diagnosis not present

## 2017-10-18 DIAGNOSIS — Z992 Dependence on renal dialysis: Secondary | ICD-10-CM | POA: Diagnosis not present

## 2017-10-18 DIAGNOSIS — M48062 Spinal stenosis, lumbar region with neurogenic claudication: Secondary | ICD-10-CM | POA: Diagnosis not present

## 2017-10-18 DIAGNOSIS — I120 Hypertensive chronic kidney disease with stage 5 chronic kidney disease or end stage renal disease: Secondary | ICD-10-CM | POA: Diagnosis not present

## 2017-10-18 DIAGNOSIS — D509 Iron deficiency anemia, unspecified: Secondary | ICD-10-CM | POA: Diagnosis not present

## 2017-10-21 ENCOUNTER — Other Ambulatory Visit
Admission: RE | Admit: 2017-10-21 | Discharge: 2017-10-21 | Disposition: A | Discharge status: Home / Self Care | Payer: Medicare Other | Source: Other Acute Inpatient Hospital | Attending: Nephrology | Admitting: Nephrology

## 2017-10-21 DIAGNOSIS — N186 End stage renal disease: Secondary | ICD-10-CM | POA: Diagnosis not present

## 2017-10-21 LAB — PHOSPHORUS: Phosphorus: 2.5 mg/dL (ref 2.5–4.6)

## 2017-10-25 DIAGNOSIS — N186 End stage renal disease: Secondary | ICD-10-CM | POA: Diagnosis not present

## 2017-10-25 DIAGNOSIS — M48062 Spinal stenosis, lumbar region with neurogenic claudication: Secondary | ICD-10-CM | POA: Diagnosis not present

## 2017-10-25 DIAGNOSIS — G8929 Other chronic pain: Secondary | ICD-10-CM | POA: Diagnosis not present

## 2017-10-25 DIAGNOSIS — I120 Hypertensive chronic kidney disease with stage 5 chronic kidney disease or end stage renal disease: Secondary | ICD-10-CM | POA: Diagnosis not present

## 2017-10-25 DIAGNOSIS — M4712 Other spondylosis with myelopathy, cervical region: Secondary | ICD-10-CM | POA: Diagnosis not present

## 2017-10-25 DIAGNOSIS — M5030 Other cervical disc degeneration, unspecified cervical region: Secondary | ICD-10-CM | POA: Diagnosis not present

## 2017-10-28 ENCOUNTER — Other Ambulatory Visit
Admission: RE | Admit: 2017-10-28 | Discharge: 2017-10-28 | Disposition: A | Discharge status: Home / Self Care | Payer: Medicare Other | Source: Ambulatory Visit | Attending: Nephrology | Admitting: Nephrology

## 2017-10-28 DIAGNOSIS — N186 End stage renal disease: Secondary | ICD-10-CM | POA: Diagnosis not present

## 2017-10-28 LAB — CBC WITH DIFFERENTIAL/PLATELET
Abs Immature Granulocytes: 0.02 10*3/uL (ref 0.00–0.07)
Basophils Absolute: 0.1 10*3/uL (ref 0.0–0.1)
Basophils Relative: 1 %
Eosinophils Absolute: 0.3 10*3/uL (ref 0.0–0.5)
Eosinophils Relative: 4 %
HEMATOCRIT: 31 % — AB (ref 39.0–52.0)
Hemoglobin: 10.1 g/dL — ABNORMAL LOW (ref 13.0–17.0)
Immature Granulocytes: 0 %
LYMPHS ABS: 3.4 10*3/uL (ref 0.7–4.0)
LYMPHS PCT: 40 %
MCH: 33.4 pg (ref 26.0–34.0)
MCHC: 32.6 g/dL (ref 30.0–36.0)
MCV: 102.6 fL — ABNORMAL HIGH (ref 80.0–100.0)
MONO ABS: 1.1 10*3/uL — AB (ref 0.1–1.0)
MONOS PCT: 13 %
Neutro Abs: 3.5 10*3/uL (ref 1.7–7.7)
Neutrophils Relative %: 42 %
Platelets: 189 10*3/uL (ref 150–400)
RBC: 3.02 MIL/uL — ABNORMAL LOW (ref 4.22–5.81)
RDW: 15.1 % (ref 11.5–15.5)
WBC: 8.3 10*3/uL (ref 4.0–10.5)
nRBC: 0 % (ref 0.0–0.2)

## 2017-10-28 LAB — RENAL FUNCTION PANEL
ANION GAP: 13 (ref 5–15)
Albumin: 3.4 g/dL — ABNORMAL LOW (ref 3.5–5.0)
BUN: 63 mg/dL — ABNORMAL HIGH (ref 8–23)
CO2: 25 mmol/L (ref 22–32)
Calcium: 8.6 mg/dL — ABNORMAL LOW (ref 8.9–10.3)
Chloride: 97 mmol/L — ABNORMAL LOW (ref 98–111)
Creatinine, Ser: 5.64 mg/dL — ABNORMAL HIGH (ref 0.61–1.24)
GFR calc Af Amer: 11 mL/min — ABNORMAL LOW (ref >60)
GFR calc non Af Amer: 10 mL/min — ABNORMAL LOW (ref >60)
GLUCOSE: 126 mg/dL — AB (ref 70–99)
PHOSPHORUS: 4.7 mg/dL — AB (ref 2.5–4.6)
POTASSIUM: 4.7 mmol/L (ref 3.5–5.1)
Sodium: 135 mmol/L (ref 135–145)

## 2017-11-03 DIAGNOSIS — G8929 Other chronic pain: Secondary | ICD-10-CM | POA: Diagnosis not present

## 2017-11-03 DIAGNOSIS — I120 Hypertensive chronic kidney disease with stage 5 chronic kidney disease or end stage renal disease: Secondary | ICD-10-CM | POA: Diagnosis not present

## 2017-11-03 DIAGNOSIS — M5030 Other cervical disc degeneration, unspecified cervical region: Secondary | ICD-10-CM | POA: Diagnosis not present

## 2017-11-03 DIAGNOSIS — N186 End stage renal disease: Secondary | ICD-10-CM | POA: Diagnosis not present

## 2017-11-03 DIAGNOSIS — M4712 Other spondylosis with myelopathy, cervical region: Secondary | ICD-10-CM | POA: Diagnosis not present

## 2017-11-03 DIAGNOSIS — M48062 Spinal stenosis, lumbar region with neurogenic claudication: Secondary | ICD-10-CM | POA: Diagnosis not present

## 2017-11-08 DIAGNOSIS — M48062 Spinal stenosis, lumbar region with neurogenic claudication: Secondary | ICD-10-CM | POA: Diagnosis not present

## 2017-11-08 DIAGNOSIS — N186 End stage renal disease: Secondary | ICD-10-CM | POA: Diagnosis not present

## 2017-11-08 DIAGNOSIS — G8929 Other chronic pain: Secondary | ICD-10-CM | POA: Diagnosis not present

## 2017-11-08 DIAGNOSIS — M4712 Other spondylosis with myelopathy, cervical region: Secondary | ICD-10-CM | POA: Diagnosis not present

## 2017-11-08 DIAGNOSIS — M5030 Other cervical disc degeneration, unspecified cervical region: Secondary | ICD-10-CM | POA: Diagnosis not present

## 2017-11-08 DIAGNOSIS — I120 Hypertensive chronic kidney disease with stage 5 chronic kidney disease or end stage renal disease: Secondary | ICD-10-CM | POA: Diagnosis not present

## 2017-11-10 DIAGNOSIS — M5030 Other cervical disc degeneration, unspecified cervical region: Secondary | ICD-10-CM | POA: Diagnosis not present

## 2017-11-10 DIAGNOSIS — M4712 Other spondylosis with myelopathy, cervical region: Secondary | ICD-10-CM | POA: Diagnosis not present

## 2017-11-10 DIAGNOSIS — I120 Hypertensive chronic kidney disease with stage 5 chronic kidney disease or end stage renal disease: Secondary | ICD-10-CM | POA: Diagnosis not present

## 2017-11-10 DIAGNOSIS — N186 End stage renal disease: Secondary | ICD-10-CM | POA: Diagnosis not present

## 2017-11-10 DIAGNOSIS — M48062 Spinal stenosis, lumbar region with neurogenic claudication: Secondary | ICD-10-CM | POA: Diagnosis not present

## 2017-11-10 DIAGNOSIS — G8929 Other chronic pain: Secondary | ICD-10-CM | POA: Diagnosis not present

## 2017-11-15 DIAGNOSIS — M4712 Other spondylosis with myelopathy, cervical region: Secondary | ICD-10-CM | POA: Diagnosis not present

## 2017-11-15 DIAGNOSIS — I120 Hypertensive chronic kidney disease with stage 5 chronic kidney disease or end stage renal disease: Secondary | ICD-10-CM | POA: Diagnosis not present

## 2017-11-15 DIAGNOSIS — N186 End stage renal disease: Secondary | ICD-10-CM | POA: Diagnosis not present

## 2017-11-15 DIAGNOSIS — M5030 Other cervical disc degeneration, unspecified cervical region: Secondary | ICD-10-CM | POA: Diagnosis not present

## 2017-11-15 DIAGNOSIS — M48062 Spinal stenosis, lumbar region with neurogenic claudication: Secondary | ICD-10-CM | POA: Diagnosis not present

## 2017-11-15 DIAGNOSIS — G8929 Other chronic pain: Secondary | ICD-10-CM | POA: Diagnosis not present

## 2017-11-17 DIAGNOSIS — G8929 Other chronic pain: Secondary | ICD-10-CM | POA: Diagnosis not present

## 2017-11-17 DIAGNOSIS — N186 End stage renal disease: Secondary | ICD-10-CM | POA: Diagnosis not present

## 2017-11-17 DIAGNOSIS — M5030 Other cervical disc degeneration, unspecified cervical region: Secondary | ICD-10-CM | POA: Diagnosis not present

## 2017-11-17 DIAGNOSIS — I120 Hypertensive chronic kidney disease with stage 5 chronic kidney disease or end stage renal disease: Secondary | ICD-10-CM | POA: Diagnosis not present

## 2017-11-17 DIAGNOSIS — M4712 Other spondylosis with myelopathy, cervical region: Secondary | ICD-10-CM | POA: Diagnosis not present

## 2017-11-17 DIAGNOSIS — M48062 Spinal stenosis, lumbar region with neurogenic claudication: Secondary | ICD-10-CM | POA: Diagnosis not present

## 2017-11-22 ENCOUNTER — Other Ambulatory Visit: Payer: Self-pay

## 2017-11-22 ENCOUNTER — Emergency Department: Payer: Medicare Other

## 2017-11-22 ENCOUNTER — Encounter: Payer: Self-pay | Admitting: Emergency Medicine

## 2017-11-22 ENCOUNTER — Inpatient Hospital Stay
Admission: EM | Admit: 2017-11-22 | Discharge: 2017-11-25 | DRG: 604 | Disposition: A | Discharge status: Home / Self Care | Payer: Medicare Other | Attending: Internal Medicine | Admitting: Internal Medicine

## 2017-11-22 DIAGNOSIS — D631 Anemia in chronic kidney disease: Secondary | ICD-10-CM | POA: Diagnosis present

## 2017-11-22 DIAGNOSIS — S299XXA Unspecified injury of thorax, initial encounter: Secondary | ICD-10-CM | POA: Diagnosis not present

## 2017-11-22 DIAGNOSIS — N2581 Secondary hyperparathyroidism of renal origin: Secondary | ICD-10-CM | POA: Diagnosis not present

## 2017-11-22 DIAGNOSIS — S40021A Contusion of right upper arm, initial encounter: Principal | ICD-10-CM | POA: Diagnosis present

## 2017-11-22 DIAGNOSIS — S199XXA Unspecified injury of neck, initial encounter: Secondary | ICD-10-CM | POA: Diagnosis not present

## 2017-11-22 DIAGNOSIS — I10 Essential (primary) hypertension: Secondary | ICD-10-CM | POA: Diagnosis not present

## 2017-11-22 DIAGNOSIS — N186 End stage renal disease: Secondary | ICD-10-CM

## 2017-11-22 DIAGNOSIS — S8991XA Unspecified injury of right lower leg, initial encounter: Secondary | ICD-10-CM | POA: Diagnosis not present

## 2017-11-22 DIAGNOSIS — T80818A Extravasation of other vesicant agent, initial encounter: Secondary | ICD-10-CM | POA: Diagnosis not present

## 2017-11-22 DIAGNOSIS — L139 Bullous disorder, unspecified: Secondary | ICD-10-CM | POA: Diagnosis present

## 2017-11-22 DIAGNOSIS — L8990 Pressure ulcer of unspecified site, unspecified stage: Secondary | ICD-10-CM

## 2017-11-22 DIAGNOSIS — W19XXXA Unspecified fall, initial encounter: Secondary | ICD-10-CM

## 2017-11-22 DIAGNOSIS — S40211A Abrasion of right shoulder, initial encounter: Secondary | ICD-10-CM | POA: Diagnosis not present

## 2017-11-22 DIAGNOSIS — E785 Hyperlipidemia, unspecified: Secondary | ICD-10-CM | POA: Diagnosis present

## 2017-11-22 DIAGNOSIS — S4991XA Unspecified injury of right shoulder and upper arm, initial encounter: Secondary | ICD-10-CM | POA: Diagnosis not present

## 2017-11-22 DIAGNOSIS — M546 Pain in thoracic spine: Secondary | ICD-10-CM | POA: Diagnosis not present

## 2017-11-22 DIAGNOSIS — M25561 Pain in right knee: Secondary | ICD-10-CM | POA: Diagnosis present

## 2017-11-22 DIAGNOSIS — G8929 Other chronic pain: Secondary | ICD-10-CM | POA: Diagnosis present

## 2017-11-22 DIAGNOSIS — M542 Cervicalgia: Secondary | ICD-10-CM | POA: Diagnosis not present

## 2017-11-22 DIAGNOSIS — M4802 Spinal stenosis, cervical region: Secondary | ICD-10-CM | POA: Diagnosis present

## 2017-11-22 DIAGNOSIS — Z880 Allergy status to penicillin: Secondary | ICD-10-CM

## 2017-11-22 DIAGNOSIS — Y9289 Other specified places as the place of occurrence of the external cause: Secondary | ICD-10-CM

## 2017-11-22 DIAGNOSIS — K219 Gastro-esophageal reflux disease without esophagitis: Secondary | ICD-10-CM | POA: Diagnosis present

## 2017-11-22 DIAGNOSIS — M25511 Pain in right shoulder: Secondary | ICD-10-CM | POA: Diagnosis not present

## 2017-11-22 DIAGNOSIS — R0781 Pleurodynia: Secondary | ICD-10-CM | POA: Diagnosis not present

## 2017-11-22 DIAGNOSIS — Z888 Allergy status to other drugs, medicaments and biological substances status: Secondary | ICD-10-CM

## 2017-11-22 DIAGNOSIS — T148XXA Other injury of unspecified body region, initial encounter: Secondary | ICD-10-CM | POA: Diagnosis present

## 2017-11-22 DIAGNOSIS — Z7952 Long term (current) use of systemic steroids: Secondary | ICD-10-CM

## 2017-11-22 DIAGNOSIS — W07XXXA Fall from chair, initial encounter: Secondary | ICD-10-CM | POA: Diagnosis not present

## 2017-11-22 DIAGNOSIS — Z882 Allergy status to sulfonamides status: Secondary | ICD-10-CM

## 2017-11-22 DIAGNOSIS — M7989 Other specified soft tissue disorders: Secondary | ICD-10-CM | POA: Diagnosis not present

## 2017-11-22 DIAGNOSIS — Z91041 Radiographic dye allergy status: Secondary | ICD-10-CM

## 2017-11-22 DIAGNOSIS — N269 Renal sclerosis, unspecified: Secondary | ICD-10-CM | POA: Diagnosis present

## 2017-11-22 DIAGNOSIS — Z992 Dependence on renal dialysis: Secondary | ICD-10-CM

## 2017-11-22 DIAGNOSIS — Z79899 Other long term (current) drug therapy: Secondary | ICD-10-CM

## 2017-11-22 HISTORY — DX: Disorder of kidney and ureter, unspecified: N28.9

## 2017-11-22 LAB — CBC WITH DIFFERENTIAL/PLATELET
ABS IMMATURE GRANULOCYTES: 0.01 10*3/uL (ref 0.00–0.07)
BASOS PCT: 1 %
Basophils Absolute: 0.1 10*3/uL (ref 0.0–0.1)
Eosinophils Absolute: 0.3 10*3/uL (ref 0.0–0.5)
Eosinophils Relative: 3 %
HCT: 31.3 % — ABNORMAL LOW (ref 39.0–52.0)
HEMOGLOBIN: 10.2 g/dL — AB (ref 13.0–17.0)
IMMATURE GRANULOCYTES: 0 %
Lymphocytes Relative: 50 %
Lymphs Abs: 3.9 10*3/uL (ref 0.7–4.0)
MCH: 33.2 pg (ref 26.0–34.0)
MCHC: 32.6 g/dL (ref 30.0–36.0)
MCV: 102 fL — AB (ref 80.0–100.0)
MONO ABS: 1.3 10*3/uL — AB (ref 0.1–1.0)
MONOS PCT: 16 %
Neutro Abs: 2.4 10*3/uL (ref 1.7–7.7)
Neutrophils Relative %: 30 %
PLATELETS: 200 10*3/uL (ref 150–400)
RBC: 3.07 MIL/uL — ABNORMAL LOW (ref 4.22–5.81)
RDW: 14.7 % (ref 11.5–15.5)
WBC: 7.9 10*3/uL (ref 4.0–10.5)
nRBC: 0 % (ref 0.0–0.2)

## 2017-11-22 LAB — BASIC METABOLIC PANEL
ANION GAP: 11 (ref 5–15)
BUN: 47 mg/dL — AB (ref 8–23)
CHLORIDE: 96 mmol/L — AB (ref 98–111)
CO2: 29 mmol/L (ref 22–32)
Calcium: 8.8 mg/dL — ABNORMAL LOW (ref 8.9–10.3)
Creatinine, Ser: 4.45 mg/dL — ABNORMAL HIGH (ref 0.61–1.24)
GFR calc Af Amer: 15 mL/min — ABNORMAL LOW (ref >60)
GFR, EST NON AFRICAN AMERICAN: 13 mL/min — AB (ref >60)
GLUCOSE: 122 mg/dL — AB (ref 70–99)
POTASSIUM: 4.1 mmol/L (ref 3.5–5.1)
SODIUM: 136 mmol/L (ref 135–145)

## 2017-11-22 LAB — TYPE AND SCREEN
ABO/RH(D): A POS
Antibody Screen: NEGATIVE

## 2017-11-22 MED ORDER — MORPHINE SULFATE (PF) 4 MG/ML IV SOLN
4.0000 mg | Freq: Once | INTRAVENOUS | Status: AC
  Administered 2017-11-22: 4 mg via INTRAVENOUS

## 2017-11-22 MED ORDER — MORPHINE SULFATE (PF) 4 MG/ML IV SOLN
INTRAVENOUS | Status: AC
  Administered 2017-11-22: 4 mg via INTRAVENOUS
  Filled 2017-11-22: qty 1

## 2017-11-22 MED ORDER — FENTANYL CITRATE (PF) 100 MCG/2ML IJ SOLN
50.0000 ug | Freq: Once | INTRAMUSCULAR | Status: AC
  Administered 2017-11-22: 50 ug via INTRAVENOUS

## 2017-11-22 MED ORDER — HYDROMORPHONE HCL 1 MG/ML IJ SOLN
0.5000 mg | Freq: Once | INTRAMUSCULAR | Status: AC
  Administered 2017-11-22: 0.5 mg via INTRAVENOUS
  Filled 2017-11-22: qty 1

## 2017-11-22 MED ORDER — MORPHINE SULFATE (PF) 4 MG/ML IV SOLN
4.0000 mg | Freq: Once | INTRAVENOUS | Status: AC
  Administered 2017-11-22: 4 mg via INTRAVENOUS
  Filled 2017-11-22: qty 1

## 2017-11-22 MED ORDER — FENTANYL CITRATE (PF) 100 MCG/2ML IJ SOLN
INTRAMUSCULAR | Status: AC
  Administered 2017-11-22: 50 ug via INTRAVENOUS
  Filled 2017-11-22: qty 2

## 2017-11-22 MED ORDER — LIDOCAINE HCL (PF) 1 % IJ SOLN
INTRAMUSCULAR | Status: AC
  Administered 2017-11-22: 22:00:00
  Filled 2017-11-22: qty 5

## 2017-11-22 MED ORDER — DIPHENHYDRAMINE HCL 50 MG/ML IJ SOLN
50.0000 mg | Freq: Once | INTRAMUSCULAR | Status: AC
  Administered 2017-11-22: 50 mg via INTRAVENOUS
  Filled 2017-11-22: qty 1

## 2017-11-22 MED ORDER — HYDRALAZINE HCL 20 MG/ML IJ SOLN
20.0000 mg | Freq: Once | INTRAMUSCULAR | Status: AC
  Administered 2017-11-22: 20 mg via INTRAVENOUS
  Filled 2017-11-22: qty 1

## 2017-11-22 MED ORDER — IOPAMIDOL (ISOVUE-370) INJECTION 76%
100.0000 mL | Freq: Once | INTRAVENOUS | Status: AC | PRN
  Administered 2017-11-22: 100 mL via INTRAVENOUS

## 2017-11-22 MED ORDER — HYDROCORTISONE NA SUCCINATE PF 100 MG IJ SOLR
200.0000 mg | Freq: Once | INTRAMUSCULAR | Status: AC
  Administered 2017-11-22: 200 mg via INTRAVENOUS
  Filled 2017-11-22: qty 4

## 2017-11-22 MED ORDER — FENTANYL CITRATE (PF) 100 MCG/2ML IJ SOLN
50.0000 ug | Freq: Once | INTRAMUSCULAR | Status: AC
  Administered 2017-11-22: 50 ug via INTRAVENOUS
  Filled 2017-11-22: qty 2

## 2017-11-22 MED ORDER — HYDRALAZINE HCL 20 MG/ML IJ SOLN
10.0000 mg | Freq: Once | INTRAMUSCULAR | Status: AC
  Administered 2017-11-22: 10 mg via INTRAVENOUS
  Filled 2017-11-22: qty 1

## 2017-11-22 MED ORDER — LIDOCAINE HCL (PF) 1 % IJ SOLN
INTRAMUSCULAR | Status: AC
  Administered 2017-11-22: 5 mL
  Filled 2017-11-22: qty 5

## 2017-11-22 MED ORDER — ONDANSETRON HCL 4 MG/2ML IJ SOLN
4.0000 mg | Freq: Once | INTRAMUSCULAR | Status: AC
  Administered 2017-11-22: 4 mg via INTRAVENOUS
  Filled 2017-11-22: qty 2

## 2017-11-22 MED ORDER — MORPHINE SULFATE (PF) 4 MG/ML IV SOLN
4.0000 mg | INTRAVENOUS | Status: DC | PRN
  Administered 2017-11-22: 4 mg via INTRAVENOUS
  Filled 2017-11-22: qty 1

## 2017-11-22 NOTE — ED Triage Notes (Signed)
Pt to ED from home via EMS c/o mechanical fall today, was going down ramp in wheelchair that hit the edge of a barrier and toppled the wheelchair.  Pt with large swelling to right shoulder and some swelling and abrasion to right knee.  Denies hitting head or LOC.  Hx HTN, CKD, dialysis on MWF.
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
## 2017-11-22 NOTE — ED Notes (Signed)
Patient transported to CT 

## 2017-11-22 NOTE — ED Provider Notes (Signed)
Patient received in sign-out from Dr. Reita Cliche.  Workup and evaluation pending the angiogram, orthopedic evaluation and reassessment.  Due to limited access right lower extremity IV was placed.  Currently awaiting CTA.  Patient has been evaluated by Dr. Rudene Christians of orthopedics at bedside.  Bedside ultrasound shows evidence of a hematoma.  I do not see any evidence of pulsatile expansion at this time.  We will give IV antihypertensive and anti-pain medication.  Due to difficulty with obtaining IV access, a 20G peripheral IV catheter was inserted using US guidance into the right thigh.  The site was prepped with chlorhexidine and allowed to dry.  The patient tolerated the procedure without any complications.  Linus Salmons Line Date/Time: 11/22/2017 9:48 PM Performed by: Merlyn Lot, MD Authorized by: Merlyn Lot, MD   Consent:    Consent obtained:  Verbal   Consent given by:  Patient   Risks discussed:  Arterial puncture, bleeding, infection, incorrect placement and nerve damage Pre-procedure details:    Hand hygiene: Hand hygiene performed prior to insertion     Sterile barrier technique: All elements of maximal sterile technique followed     Skin preparation:  2% chlorhexidine   Skin preparation agent: Skin preparation agent completely dried prior to procedure   Anesthesia (see MAR for exact dosages):    Anesthesia method:  Local infiltration   Local anesthetic:  Lidocaine 1% w/o epi Procedure details:    Location:  L femoral   Patient position:  Trendelenburg   Procedural supplies:  Triple lumen   Catheter size:  7 Fr   Landmarks identified: yes     Ultrasound guidance: yes     Sterile ultrasound techniques: Sterile gel and sterile probe covers were used     Number of attempts:  1   Successful placement: yes   Post-procedure details:    Post-procedure:  Dressing applied and line sutured   Assessment:  Blood return through all ports and free fluid flow   Patient tolerance of  procedure:  Tolerated well, no immediate complications    Clinical Course as of Nov 23 2326  Tue Nov 22, 2017  2150 CT without does show evidence of large hematoma.  Will need CT with IV contrast to evaluate for active extravasation.  Due to extravasation of contrast dye with previously inserted IV central line was placed after consenting patient at bedside.  No evidence of ischemic or soft tissue injury at area of contrast infiltration.   [PR]  2316 CT imaging does not show any evidence of active extra of does show large hematoma which will require drainage.   [PR]  2327 Again discussed case and results of CT imaging with Dr. Rudene Christians.  Recommends evaluation by IR for drainage of large hematoma with serial compartment measurements.  Patient still requiring multiple doses of IV pain medication therefore will discuss with hospitalist for admission for pain control.   [PR]    Clinical Course User Index [PR] Merlyn Lot, MD        Merlyn Lot, MD 11/22/17 2328

## 2017-11-22 NOTE — ED Notes (Signed)
Patient transported to X-ray 

## 2017-11-23 DIAGNOSIS — D631 Anemia in chronic kidney disease: Secondary | ICD-10-CM | POA: Diagnosis not present

## 2017-11-23 DIAGNOSIS — N2581 Secondary hyperparathyroidism of renal origin: Secondary | ICD-10-CM | POA: Diagnosis not present

## 2017-11-23 DIAGNOSIS — I10 Essential (primary) hypertension: Secondary | ICD-10-CM | POA: Diagnosis not present

## 2017-11-23 DIAGNOSIS — E785 Hyperlipidemia, unspecified: Secondary | ICD-10-CM | POA: Diagnosis not present

## 2017-11-23 DIAGNOSIS — L8990 Pressure ulcer of unspecified site, unspecified stage: Secondary | ICD-10-CM

## 2017-11-23 DIAGNOSIS — T148XXA Other injury of unspecified body region, initial encounter: Secondary | ICD-10-CM | POA: Diagnosis not present

## 2017-11-23 DIAGNOSIS — N186 End stage renal disease: Secondary | ICD-10-CM | POA: Diagnosis not present

## 2017-11-23 DIAGNOSIS — I120 Hypertensive chronic kidney disease with stage 5 chronic kidney disease or end stage renal disease: Secondary | ICD-10-CM | POA: Diagnosis not present

## 2017-11-23 DIAGNOSIS — S40011A Contusion of right shoulder, initial encounter: Secondary | ICD-10-CM | POA: Diagnosis not present

## 2017-11-23 LAB — CBC
HCT: 28.2 % — ABNORMAL LOW (ref 39.0–52.0)
Hemoglobin: 9.3 g/dL — ABNORMAL LOW (ref 13.0–17.0)
MCH: 33.2 pg (ref 26.0–34.0)
MCHC: 33 g/dL (ref 30.0–36.0)
MCV: 100.7 fL — ABNORMAL HIGH (ref 80.0–100.0)
Platelets: 203 10*3/uL (ref 150–400)
RBC: 2.8 MIL/uL — ABNORMAL LOW (ref 4.22–5.81)
RDW: 14.6 % (ref 11.5–15.5)
WBC: 6.9 10*3/uL (ref 4.0–10.5)
nRBC: 0 % (ref 0.0–0.2)

## 2017-11-23 LAB — LACTIC ACID, PLASMA
Lactic Acid, Venous: 0.7 mmol/L (ref 0.5–1.9)
Lactic Acid, Venous: 0.8 mmol/L (ref 0.5–1.9)

## 2017-11-23 LAB — BASIC METABOLIC PANEL
Anion gap: 13 (ref 5–15)
BUN: 57 mg/dL — AB (ref 8–23)
CO2: 28 mmol/L (ref 22–32)
Calcium: 8.7 mg/dL — ABNORMAL LOW (ref 8.9–10.3)
Chloride: 96 mmol/L — ABNORMAL LOW (ref 98–111)
Creatinine, Ser: 5.11 mg/dL — ABNORMAL HIGH (ref 0.61–1.24)
GFR calc Af Amer: 13 mL/min — ABNORMAL LOW (ref >60)
GFR, EST NON AFRICAN AMERICAN: 11 mL/min — AB (ref >60)
Glucose, Bld: 113 mg/dL — ABNORMAL HIGH (ref 70–99)
Potassium: 4.9 mmol/L (ref 3.5–5.1)
SODIUM: 137 mmol/L (ref 135–145)

## 2017-11-23 LAB — PHOSPHORUS: Phosphorus: 4.8 mg/dL — ABNORMAL HIGH (ref 2.5–4.6)

## 2017-11-23 MED ORDER — SERTRALINE HCL 50 MG PO TABS
150.0000 mg | ORAL_TABLET | Freq: Every day | ORAL | Status: DC
  Administered 2017-11-23 – 2017-11-24 (×2): 150 mg via ORAL
  Filled 2017-11-23 (×2): qty 3

## 2017-11-23 MED ORDER — HYDROXYZINE HCL 25 MG PO TABS
25.0000 mg | ORAL_TABLET | Freq: Three times a day (TID) | ORAL | Status: DC | PRN
  Filled 2017-11-23: qty 1

## 2017-11-23 MED ORDER — ONDANSETRON HCL 4 MG PO TABS: 4.0000 mg | ORAL_TABLET | Freq: Four times a day (QID) | ORAL | Status: DC | PRN

## 2017-11-23 MED ORDER — HYDROMORPHONE HCL 1 MG/ML IJ SOLN
0.5000 mg | INTRAMUSCULAR | Status: DC | PRN
  Administered 2017-11-24 (×2): 0.5 mg via INTRAVENOUS
  Filled 2017-11-23 (×2): qty 1

## 2017-11-23 MED ORDER — MIRTAZAPINE 15 MG PO TABS
15.0000 mg | ORAL_TABLET | Freq: Every day | ORAL | Status: DC
  Administered 2017-11-23 – 2017-11-24 (×2): 15 mg via ORAL
  Filled 2017-11-23 (×2): qty 1

## 2017-11-23 MED ORDER — TRAZODONE HCL 100 MG PO TABS
100.0000 mg | ORAL_TABLET | Freq: Every day | ORAL | Status: DC
  Administered 2017-11-23 – 2017-11-24 (×2): 100 mg via ORAL
  Filled 2017-11-23 (×2): qty 1

## 2017-11-23 MED ORDER — HYDRALAZINE HCL 20 MG/ML IJ SOLN: 10.0000 mg | INTRAMUSCULAR | Status: DC | PRN

## 2017-11-23 MED ORDER — ONDANSETRON HCL 4 MG/2ML IJ SOLN: 4.0000 mg | Freq: Four times a day (QID) | INTRAMUSCULAR | Status: DC | PRN

## 2017-11-23 MED ORDER — MORPHINE SULFATE (PF) 2 MG/ML IV SOLN: 1.0000 mg | INTRAVENOUS | Status: DC | PRN

## 2017-11-23 MED ORDER — ALPRAZOLAM 0.5 MG PO TABS
0.5000 mg | ORAL_TABLET | Freq: Four times a day (QID) | ORAL | Status: DC | PRN
  Administered 2017-11-23 – 2017-11-24 (×2): 0.5 mg via ORAL
  Filled 2017-11-23 (×2): qty 1

## 2017-11-23 MED ORDER — OXYCODONE HCL 5 MG PO CAPS: 5.0000 mg | ORAL_CAPSULE | ORAL | Status: DC | PRN

## 2017-11-23 MED ORDER — SEVELAMER CARBONATE 0.8 G PO PACK
0.8000 g | PACK | Freq: Three times a day (TID) | ORAL | Status: DC
  Administered 2017-11-23 – 2017-11-25 (×6): 0.8 g via ORAL
  Filled 2017-11-23 (×9): qty 1

## 2017-11-23 MED ORDER — ACETAMINOPHEN 650 MG RE SUPP: 650.0000 mg | Freq: Four times a day (QID) | RECTAL | Status: DC | PRN

## 2017-11-23 MED ORDER — OXYCODONE HCL 5 MG PO TABS
5.0000 mg | ORAL_TABLET | ORAL | Status: DC | PRN
  Administered 2017-11-23 – 2017-11-24 (×8): 10 mg via ORAL
  Administered 2017-11-25: 5 mg via ORAL
  Administered 2017-11-25: 10 mg via ORAL
  Filled 2017-11-23 (×10): qty 2
  Filled 2017-11-23: qty 1

## 2017-11-23 MED ORDER — CHLORHEXIDINE GLUCONATE CLOTH 2 % EX PADS
6.0000 | MEDICATED_PAD | Freq: Every day | CUTANEOUS | Status: DC
  Administered 2017-11-23: 6 via TOPICAL

## 2017-11-23 MED ORDER — ACETAMINOPHEN 325 MG PO TABS
650.0000 mg | ORAL_TABLET | Freq: Four times a day (QID) | ORAL | Status: DC | PRN
  Administered 2017-11-24: 650 mg via ORAL
  Filled 2017-11-23: qty 2

## 2017-11-23 MED ORDER — CYCLOBENZAPRINE HCL 10 MG PO TABS
5.0000 mg | ORAL_TABLET | Freq: Three times a day (TID) | ORAL | Status: DC | PRN
  Administered 2017-11-23 – 2017-11-24 (×2): 5 mg via ORAL
  Filled 2017-11-23 (×2): qty 1

## 2017-11-23 MED ORDER — ATORVASTATIN CALCIUM 20 MG PO TABS
20.0000 mg | ORAL_TABLET | Freq: Every day | ORAL | Status: DC
  Administered 2017-11-23 – 2017-11-24 (×2): 20 mg via ORAL
  Filled 2017-11-23 (×2): qty 1

## 2017-11-23 MED ORDER — CARVEDILOL 25 MG PO TABS
25.0000 mg | ORAL_TABLET | Freq: Two times a day (BID) | ORAL | Status: DC
  Administered 2017-11-23 – 2017-11-24 (×4): 25 mg via ORAL
  Filled 2017-11-23 (×4): qty 1

## 2017-11-23 MED ORDER — AMLODIPINE BESYLATE 5 MG PO TABS: 5.0000 mg | ORAL_TABLET | ORAL | Status: DC

## 2017-11-23 MED ORDER — CINACALCET HCL 30 MG PO TABS
90.0000 mg | ORAL_TABLET | Freq: Every day | ORAL | Status: DC
  Administered 2017-11-23 – 2017-11-25 (×3): 90 mg via ORAL
  Filled 2017-11-23 (×3): qty 3

## 2017-11-23 MED ORDER — CALCIUM ACETATE (PHOS BINDER) 667 MG PO CAPS
1334.0000 mg | ORAL_CAPSULE | Freq: Three times a day (TID) | ORAL | Status: DC
  Administered 2017-11-23 – 2017-11-25 (×6): 1334 mg via ORAL
  Filled 2017-11-23 (×9): qty 2

## 2017-11-23 MED ORDER — LOSARTAN POTASSIUM 50 MG PO TABS
50.0000 mg | ORAL_TABLET | Freq: Every day | ORAL | Status: DC
  Administered 2017-11-24: 50 mg via ORAL
  Filled 2017-11-23: qty 1

## 2017-11-23 MED ORDER — PANTOPRAZOLE SODIUM 20 MG PO TBEC
20.0000 mg | DELAYED_RELEASE_TABLET | Freq: Two times a day (BID) | ORAL | Status: DC
  Administered 2017-11-23 – 2017-11-25 (×5): 20 mg via ORAL
  Filled 2017-11-23 (×7): qty 1
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
## 2017-11-23 NOTE — Progress Notes (Signed)
HD Post Assessment    11/23/17 1550  Neurological  Level of Consciousness Alert  Orientation Level Oriented X4  Respiratory  Respiratory Pattern Regular;Unlabored;Symmetrical  Chest Assessment Chest expansion symmetrical  Bilateral Breath Sounds Clear  Cardiac  Pulse Regular  Heart Sounds S1, S2  Jugular Venous Distention (JVD) No  ECG Monitor Yes  Cardiac Rhythm SB  Vascular  R Radial Pulse +2  L Radial Pulse +2  R Dorsalis Pedis Pulse +2  L Dorsalis Pedis Pulse +2  Integumentary  Integumentary (WDL) X  Skin Color Appropriate for ethnicity  Skin Condition Dry;Flaky;Other (Comment) (Bullous Dermatosis)  Skin Integrity Abrasion;Ecchymosis;Other (Comment) (old healing blisters)  Musculoskeletal  Musculoskeletal (WDL) X (HD pt)  Generalized Weakness Yes  GU Assessment  Genitourinary (WDL) X  Genitourinary Symptoms Anuria;Other (Comment) (Hemodialysis MWF)  Psychosocial  Psychosocial (WDL) WDL

## 2017-11-23 NOTE — Care Management Obs Status (Signed)
[facility] NOTIFICATION   Patient Details  Name: William Carpenter MRN: [id number] Date of Birth: September 25, 1955   Medicare Observation Status Notification Given:  Yes Spoke with daughter/HCPOA Juliann Pulse by phone.   Marshell Garfinkel, RN 11/23/2017, 9:04 AM

## 2017-11-23 NOTE — Progress Notes (Signed)
HD Treatment Initiated    11/23/17 1200  Vital Signs  Pulse Rate 63  Pulse Rate Source Monitor  Resp 18  BP 136/63  BP Location Right Arm  BP Method Automatic  Patient Position (if appropriate) Lying  Oxygen Therapy  SpO2 100 %  O2 Device Room Air  During Hemodialysis Assessment  Blood Flow Rate (mL/min) 350 mL/min  Arterial Pressure (mmHg) -170 mmHg  Venous Pressure (mmHg) 160 mmHg  Transmembrane Pressure (mmHg) 60 mmHg  Ultrafiltration Rate (mL/min) 570 mL/min  Dialysate Flow Rate (mL/min) 600 ml/min  Conductivity: Machine  13.9  HD Safety Checks Performed Yes  Dialysis Fluid Bolus Normal Saline  Bolus Amount (mL) 250 mL  Intra-Hemodialysis Comments Tx initiated;Progressing as prescribed  Fistula / Graft Left Upper arm  No Placement Date or Time found.   Orientation: Left  Access Location: Upper arm  Status Accessed  Needle Size 16g

## 2017-11-23 NOTE — Progress Notes (Signed)
HD Treatment Complete    11/23/17 1540  Vital Signs  Pulse Rate 62  Pulse Rate Source Monitor  During Hemodialysis Assessment  Blood Flow Rate (mL/min) 350 mL/min  Arterial Pressure (mmHg) -210 mmHg  Venous Pressure (mmHg) 170 mmHg  Transmembrane Pressure (mmHg) 60 mmHg  Ultrafiltration Rate (mL/min) 570 mL/min  Dialysate Flow Rate (mL/min) 600 ml/min  Conductivity: Machine  13.9  HD Safety Checks Performed Yes  Intra-Hemodialysis Comments Tolerated well;Tx completed ([facility] 2014)  Fistula / Graft Left Upper arm  No Placement Date or Time found.   Orientation: Left  Access Location: Upper arm  Status Deaccessed
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
## 2017-11-23 NOTE — Progress Notes (Signed)
Pre HD Treatment    11/23/17 1145  Vital Signs  Temp 97.9 F (36.6 C)  Temp Source Oral  Pulse Rate (!) 59  Pulse Rate Source Monitor  Resp 19  BP 140/66  BP Location Right Arm  BP Method Automatic  Patient Position (if appropriate) Lying  Oxygen Therapy  SpO2 100 %  O2 Device Room Air  Pain Assessment  Pain Scale 0-10  Pain Score 0  Dialysis Weight  Weight 79.6 kg  Type of Weight Pre-Dialysis  Time-Out for Hemodialysis  What Procedure? HD  Pt Identifiers(min of two) First/Last Name;MRN/Account#;Pt's DOB(use if MRN/Acct# not available  Correct Site? Yes  Correct Side? Yes  Correct Procedure? Yes  Consents Verified? Yes  Rad Studies Available? N/A  Safety Precautions Reviewed? Yes  Engineer, civil (consulting) Number 4  Station Number 4  UF/Alarm Test Passed  Conductivity: Meter 14  Conductivity: Machine  13.9  pH 7  Reverse Osmosis Main  Normal Saline Lot Number [id number]  Dialyzer Lot Number [id number]  Disposable Set Lot Number [id number]  Machine Temperature 98.6 F (37 C)  Musician and Audible Yes  Blood Lines Intact and Secured Yes  Pre Treatment Patient Checks  Vascular access used during treatment Fistula  Hepatitis B Surface Antigen Results Negative  Date Hepatitis B Surface Antigen Drawn 11/16/17  Hepatitis B Surface Antibody  (<10)  Date Hepatitis B Surface Antibody Drawn 11/16/17  Hemodialysis Consent Verified Yes  Hemodialysis Standing Orders Initiated Yes  ECG (Telemetry) Monitor On Yes  Prime Ordered Normal Saline  Length of  DialysisTreatment -hour(s) 3.5 Hour(s)  Dialysis Treatment Comments Na 140  Dialyzer Elisio 17H NR  Dialysate 2K, 2.5 Ca  Dialysis Anticoagulant None  Dialysate Flow Ordered 600  Blood Flow Rate Ordered 350 mL/min (per Dr. Juleen China at the bedside for 16g needles)  Ultrafiltration Goal 1.5 Liters  Dialysis Blood Pressure Support Ordered Normal Saline  Education / Care Plan  Dialysis Education Provided Yes  Documented  Education in Care Plan Yes  Fistula / Graft Left Upper arm  No Placement Date or Time found.   Orientation: Left  Access Location: Upper arm  Site Condition No complications  Fistula / Graft Assessment Present;Thrill;Bruit  Drainage Description None
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
## 2017-11-23 NOTE — Progress Notes (Signed)
This patient has received 94 ml's of IV Isovue 370 (type of contrast) contrast extravasation into right anterior mid thigh (part of body) during a CTA Upper extremity exam. US guided 20g to right thigh.   The exam was performed on 11/22/17.  Site / affected area assessed by Dr. Merlyn Lot. (ER Physician) after Netta Corrigan and during ER visit.  Per Dr. Quentin Cornwall patient had minimal swelling, pain or redness on right thigh as of 9:30PM.  I went home and third shift techs scanned angio study with new IV.    Series of events: Patient had infiltration around 8:50PM.  Patient complained of some pain in thigh after injection.  Dr. Quentin Cornwall was informed. [facility] extav orders put into Epic.  Dr. Randel Pigg was informed ([place]) Dr. Randel Pigg called back and asked [place] to get on-call Radiologist involved and possibly have them come in to assess leg for possible Plastic Surgery consult.  Lacretia Leigh, 3rd shift CT Tech called on call Rad Dr. Kathlene Cote and explained situation. Dr. Kathlene Cote agreed Dr. Quentin Cornwall to check site often and assess per protocol.  Safety zone put in 11/22/17. Dr. Vernard Gambles evaluated patient 11/23/17.

## 2017-11-23 NOTE — Progress Notes (Signed)
Post HD Treatment  Pt tolerated treatment well. His net UF was 1514 mL and his BVP was 69.6. All blood returned to patient. Report called to Garlan Fair, RN.     11/23/17 1545  Hand-Off documentation  Report given to (Full Name) Garlan Fair, RN  Report received from (Full Name) Stephannie Peters, RN  Vital Signs  Temp 97.6 F (36.4 C)  Temp Source Oral  Pulse Rate 62  Pulse Rate Source Monitor  Resp 16  BP (!) 182/85  BP Location Right Arm  BP Method Automatic  Patient Position (if appropriate) Lying  Oxygen Therapy  SpO2 100 %  O2 Device Room Air  Pain Assessment  Pain Scale 0-10  Pain Score 4  Dialysis Weight  Weight 77.1 kg  Type of Weight Post-Dialysis  Post-Hemodialysis Assessment  Rinseback Volume (mL) 250 mL  KECN 233 V  Dialyzer Clearance Lightly streaked  Duration of HD Treatment -hour(s) 3.5 hour(s)  Hemodialysis Intake (mL) 500 mL  UF Total -Machine (mL) 2014 mL  Net UF (mL) 1514 mL  Tolerated HD Treatment Yes  Post-Hemodialysis Comments All blood rinsed back to patient  AVG/AVF Arterial Site Held (minutes) 10 minutes  AVG/AVF Venous Site Held (minutes) 10 minutes  Fistula / Graft Left Upper arm  No Placement Date or Time found.   Orientation: Left  Access Location: Upper arm  Site Condition No complications  Fistula / Graft Assessment Present;Thrill;Bruit  Drainage Description None

## 2017-11-23 NOTE — Progress Notes (Deleted)
Post HD Assessment    11/23/17 1351  Neurological  Level of Consciousness Alert  Orientation Level Oriented X4  Respiratory  Respiratory Pattern Regular;Unlabored;Symmetrical  Chest Assessment Chest expansion symmetrical  Bilateral Breath Sounds Clear  Cardiac  Pulse Regular  Heart Sounds S1, S2  Jugular Venous Distention (JVD) No  ECG Monitor Yes  Cardiac Rhythm SB  Vascular  R Radial Pulse +2  L Radial Pulse +2  R Dorsalis Pedis Pulse +2  L Dorsalis Pedis Pulse +2  Integumentary  Integumentary (WDL) X  Skin Color Appropriate for ethnicity  Skin Condition Dry;Flaky;Other (Comment) (Bullous Dermatosis)  Skin Integrity Abrasion;Ecchymosis;Other (Comment) (old healing blisters)  Musculoskeletal  Musculoskeletal (WDL) X (HD pt)  Generalized Weakness Yes  GU Assessment  Genitourinary (WDL) X  Genitourinary Symptoms Anuria;Other (Comment) (Hemodialysis MWF)  Psychosocial  Psychosocial (WDL) WDL

## 2017-11-23 NOTE — Care Management (Signed)
[facility] hemodialysis navigator notified of patient presentation.

## 2017-11-23 NOTE — Progress Notes (Signed)
HD Pre Assessment    11/23/17 1130  Neurological  Level of Consciousness Alert  Orientation Level Oriented X4  Respiratory  Respiratory Pattern Regular;Unlabored;Symmetrical  Chest Assessment Chest expansion symmetrical  Bilateral Breath Sounds Clear  Cough None  Cardiac  Pulse Regular  Heart Sounds S1, S2  Jugular Venous Distention (JVD) No  ECG Monitor Yes  Cardiac Rhythm SB  Vascular  R Radial Pulse +2  L Radial Pulse +2  R Dorsalis Pedis Pulse +2  L Dorsalis Pedis Pulse +2  Integumentary  Integumentary (WDL) X  Skin Color Appropriate for ethnicity  Skin Condition Dry;Flaky;Other (Comment) (Bullous Dermatosis)  Skin Integrity Abrasion;Ecchymosis;Other (Comment) (old healing blisters)  Musculoskeletal  Musculoskeletal (WDL) X (HD pt)  Generalized Weakness Yes  GU Assessment  Genitourinary (WDL) X  Genitourinary Symptoms Anuria;Other (Comment) (Hemodialysis MWF)  Psychosocial  Psychosocial (WDL) WDL

## 2017-11-23 NOTE — Care Management Note (Signed)
Case Management Note  Patient Details  Name: William Carpenter MRN: [id number] Date of Birth: 12/19/1955  Subjective/Objective:                  RNCM met with patient that shared his daughter William Carpenter is his HCPOA.  He states he lives with his wife but William Carpenter and her husband support them.  His PCP is with [facility]. William Carpenter drives him to appointments.  He is not on O2 at home; he "wears his wife's when he feels like he needs it".  He depends on a rollator for mobility.  William Carpenter states he is open to [employer] and see palliative at [place].  She would like for patient to be able to receive Palliative at home.  He goes on MWF to [facility] for hemodialysis.  [facility] [place] is on diversion and patient is under observations stay.  MOON letter explained to [facility] and briefly explained it to patient but he asked that I call her.  Action/Plan: Rocky Morel has been notified of this presentation. Palliative consult requested for goals of care. Estill Bamberg with Patient pathways notified of patient presentation.  RNCM will follow.  Expected Discharge Date:                  Expected Discharge Plan:     In-House Referral:     Discharge planning Services  CM Consult  Post Acute Care Choice:  Home Health, Resumption of Svcs/PTA Provider Choice offered to:  Patient, Adult Children  DME Arranged:    DME Agency:     HH Arranged:  RN, PT, OT, Nurse's Aide, Social Work [employer] Agency:  [place]  Status of Service:  In process, will continue to follow  If discussed at Long Length of Stay Meetings, dates discussed:    Additional Comments:  Marshell Garfinkel, RN 11/23/2017, 8:51 AM

## 2017-11-23 NOTE — Progress Notes (Signed)
[facility]  ROUNDING NOTE   Subjective:   [name] admitted to [facility] on 11/22/2017 for Fall [W19.XXXA] Traumatic hematoma of right upper arm, initial encounter [S40.021A]  Seen and examined on hemodialysis. Tolerating treatment well.   Using 16 ga needles    HEMODIALYSIS FLOWSHEET:  Blood Flow Rate (mL/min): 350 mL/min Arterial Pressure (mmHg): -210 mmHg Venous Pressure (mmHg): 170 mmHg Transmembrane Pressure (mmHg): 60 mmHg Ultrafiltration Rate (mL/min): 570 mL/min Dialysate Flow Rate (mL/min): 600 ml/min Conductivity: Machine : 13.9 Conductivity: Machine : 13.9 Dialysis Fluid Bolus: Normal Saline Bolus Amount (mL): 250 mL    Objective:  Vital signs in last 24 hours:  Temp:  [97.8 F (36.6 C)-98.4 F (36.9 C)] 97.9 F (36.6 C) (11/20 1145) Pulse Rate:  [50-102] 50 (11/20 1530) Resp:  [10-20] 14 (11/20 1515) BP: (128-222)/(59-96) 149/80 (11/20 1530) SpO2:  [95 %-100 %] 100 % (11/20 1515) Weight:  [77.1 kg-79.6 kg] 79.6 kg (11/20 1145)  Weight change:  Filed Weights   11/22/17 1203 11/23/17 0134 11/23/17 1145  Weight: 73 kg 77.1 kg 79.6 kg    Intake/Output: No intake/output data recorded.   Intake/Output this shift:  No intake/output data recorded.  Physical Exam: General: NAD,   Head: Normocephalic, atraumatic. Moist oral mucosal membranes  Eyes: Anicteric, PERRL  Neck: Supple, trachea midline  Lungs:  Clear to auscultation  Heart: Regular rate and rhythm  Abdomen:  Soft, nontender,   Extremities: no peripheral edema. Right shoulder hematoma with ice pack  Neurologic: Nonfocal, moving all four extremities  Skin: No lesions  Access: Left AVF    Basic Metabolic Panel: Recent Labs  Lab 11/22/17 1209 11/23/17 0246  NA 136 137  K 4.1 4.9  CL 96* 96*  CO2 29 28  GLUCOSE 122* 113*  BUN 47* 57*  CREATININE 4.45* 5.11*  CALCIUM 8.8* 8.7*  PHOS  --  4.8*    Liver Function Tests: No results for input(s): AST, ALT,  ALKPHOS, BILITOT, PROT, ALBUMIN in the last 168 hours. No results for input(s): LIPASE, AMYLASE in the last 168 hours. No results for input(s): AMMONIA in the last 168 hours.  CBC: Recent Labs  Lab 11/22/17 1209 11/23/17 0246  WBC 7.9 6.9  NEUTROABS 2.4  --   HGB 10.2* 9.3*  HCT 31.3* 28.2*  MCV 102.0* 100.7*  PLT 200 203    Cardiac Enzymes: No results for input(s): CKTOTAL, CKMB, CKMBINDEX, TROPONINI in the last 168 hours.  BNP: Invalid input(s): POCBNP  CBG: No results for input(s): GLUCAP in the last 168 hours.  Microbiology: Results for orders placed or performed during the hospital encounter of 07/12/16  Blood Culture (routine x 2)     Status: None   Collection Time: 07/12/16  6:29 PM  Result Value Ref Range Status   Specimen Description BLOOD BLOOD RIGHT FOREARM  Final   Special Requests   Final    BOTTLES DRAWN AEROBIC AND ANAEROBIC Blood Culture adequate volume   Culture NO GROWTH 5 DAYS  Final   Report Status 07/17/2016 FINAL  Final  Blood Culture (routine x 2)     Status: None   Collection Time: 07/12/16  6:29 PM  Result Value Ref Range Status   Specimen Description BLOOD RIGHT ANTECUBITAL  Final   Special Requests   Final    BOTTLES DRAWN AEROBIC AND ANAEROBIC Blood Culture adequate volume   Culture NO GROWTH 5 DAYS  Final   Report Status 07/17/2016 FINAL  Final  MRSA PCR Screening  Status: None   Collection Time: 07/13/16  3:32 AM  Result Value Ref Range Status   MRSA by PCR NEGATIVE NEGATIVE Final    Comment:        The GeneXpert MRSA Assay (FDA approved for NASAL specimens only), is one component of a comprehensive MRSA colonization surveillance program. It is not intended to diagnose MRSA infection nor to guide or monitor treatment for MRSA infections.   Aerobic Culture (superficial specimen)     Status: None   Collection Time: 07/16/16  3:00 PM  Result Value Ref Range Status   Specimen Description ARM  Final   Special Requests Normal   Final   Gram Stain   Final    RARE WBC PRESENT, PREDOMINANTLY MONONUCLEAR RARE GRAM POSITIVE COCCI IN PAIRS Performed at [facility], [address], [place], [place] [address]    Culture FEW METHICILLIN RESISTANT STAPHYLOCOCCUS AUREUS  Final   Report Status 07/19/2016 FINAL  Final   Organism ID, Bacteria METHICILLIN RESISTANT STAPHYLOCOCCUS AUREUS  Final      Susceptibility   Methicillin resistant staphylococcus aureus - MIC*    CIPROFLOXACIN >=8 RESISTANT Resistant     ERYTHROMYCIN >=8 RESISTANT Resistant     GENTAMICIN <=0.5 SENSITIVE Sensitive     OXACILLIN >=4 RESISTANT Resistant     TETRACYCLINE <=1 SENSITIVE Sensitive     VANCOMYCIN <=0.5 SENSITIVE Sensitive     TRIMETH/SULFA <=10 SENSITIVE Sensitive     CLINDAMYCIN <=0.25 SENSITIVE Sensitive     RIFAMPIN <=0.5 SENSITIVE Sensitive     Inducible Clindamycin NEGATIVE Sensitive     * FEW METHICILLIN RESISTANT STAPHYLOCOCCUS AUREUS    Coagulation Studies: No results for input(s): LABPROT, INR in the last 72 hours.  Urinalysis: No results for input(s): COLORURINE, LABSPEC, PHURINE, GLUCOSEU, HGBUR, BILIRUBINUR, KETONESUR, PROTEINUR, UROBILINOGEN, NITRITE, LEUKOCYTESUR in the last 72 hours.  Invalid input(s): APPERANCEUR    Imaging: Dg Chest 2 View  Result Date: 11/22/2017 CLINICAL DATA:  Right-sided rib pain after fall. EXAM: CHEST - 2 VIEW; RIGHT RIBS - 2 VIEW COMPARISON:  Chest x-ray dated December 16, 2016. CT chest dated July 26, 2016. FINDINGS: No fracture or other bone lesions are seen involving the ribs. There is no evidence of pneumothorax or pleural effusion. Mildly coarsened interstitial markings are similar to prior studies. The lungs are otherwise clear. Heart size and mediastinal contours are within normal limits. IMPRESSION: 1. No acute rib fracture. 2.  No active cardiopulmonary disease. Electronically Signed   By: Titus Dubin M.D.   On: 11/22/2017 13:48   Dg Ribs Unilateral Right  Result Date:  11/22/2017 CLINICAL DATA:  Right-sided rib pain after fall. EXAM: CHEST - 2 VIEW; RIGHT RIBS - 2 VIEW COMPARISON:  Chest x-ray dated December 16, 2016. CT chest dated July 26, 2016. FINDINGS: No fracture or other bone lesions are seen involving the ribs. There is no evidence of pneumothorax or pleural effusion. Mildly coarsened interstitial markings are similar to prior studies. The lungs are otherwise clear. Heart size and mediastinal contours are within normal limits. IMPRESSION: 1. No acute rib fracture. 2.  No active cardiopulmonary disease. Electronically Signed   By: Titus Dubin M.D.   On: 11/22/2017 13:48   Dg Thoracic Spine 2 View  Result Date: 11/22/2017 CLINICAL DATA:  Upper back pain after fall. EXAM: THORACIC SPINE 2 VIEWS COMPARISON:  CT chest dated July 26, 2016. FINDINGS: Twelve rib-bearing thoracic vertebral bodies. No acute fracture or subluxation. Vertebral body heights are preserved. Mild levocurvature of  the upper thoracic spine is unchanged. Sagittal alignment is normal. Intervertebral disc spaces are relatively maintained. Partially visualized C3-C6 ACDF with unchanged fractured screws at C3 and C6. IMPRESSION: 1.  No acute osseous abnormality. Electronically Signed   By: Titus Dubin M.D.   On: 11/22/2017 13:41   Dg Shoulder Right  Result Date: 11/22/2017 CLINICAL DATA:  Mechanical fall today, was going down a ramp in a wheelchair, wheelchair struck edge of a barrier and toppled, RIGHT shoulder swelling, abrasion and swelling RIGHT knee EXAM: RIGHT SHOULDER - 2+ VIEW COMPARISON:  None FINDINGS: Diffuse osseous demineralization. Degenerative changes at the RIGHT [facility] joint. No acute fracture, dislocation, or bone destruction. Visualized RIGHT ribs intact. IMPRESSION: No acute osseous abnormalities. Osseous demineralization with degenerative changes RIGHT AC joint. Electronically Signed   By: Lavonia Dana M.D.   On: 11/22/2017 12:48   Ct Cervical Spine Wo Contrast  Result  Date: 11/22/2017 CLINICAL DATA:  Neck pain after mechanical fall today. EXAM: CT CERVICAL SPINE WITHOUT CONTRAST TECHNIQUE: Multidetector CT imaging of the cervical spine was performed without intravenous contrast. Multiplanar CT image reconstructions were also generated. COMPARISON:  07/26/2016 FINDINGS: Alignment: Mild C3-4 retrolisthesis, chronic appearing Skull base and vertebrae: Negative for acute fracture. Diffusely sclerotic skeleton with patchy lucency. Subligamentous erosion of the subclavius attachment sites. There has been no progression since 07/26/2016, findings remain consistent with renal osteodystrophy. Soft tissues and spinal canal: No prevertebral fluid or swelling. No visible canal hematoma. There are prominent vessels in the bilateral supraclavicular fossae, suspect this is from dialysis access. Disc levels: C3-4, C4-5 and C5-6 ACDF. Suboptimal healing with C3 right-sided screw fracture, although arthrodesis has been achieved at the C3-4 level (see coronal reformats). There has also C6 screw fracture on the right, and loosening of the C6 screw on the left. Diffuse cage subsidence with abutment of the inferior plate against the C7 anterior superior corner, with spurring. No solid arthrodesis is seen at C5-6 or C4-5. Upper chest: Negative IMPRESSION: 1. Negative for acute fracture. 2. Renal osteodystrophy. 3. C3-4, C4-5, and C5-6 ACDF. 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 Hardware failure is also seen at C3, although there is solid arthrodesis at C3-4. Electronically Signed   By: Monte Fantasia M.D.   On: 11/22/2017 13:21   Ct Angio Up Extrem Right W &/or Wo Contrast  Result Date: 11/22/2017 CLINICAL DATA:  Swelling to the right shoulder after a fall today. EXAM: CT ANGIOGRAPHY UPPER RIGHT EXTREMITY TECHNIQUE: CT angiography of the right upper extremity is obtained with axial images from shoulder to wrist after contrast  administration. Additional sagittal and coronal multiplanar reformatted images are obtained. CONTRAST:  100 mL Isovue 370 COMPARISON:  CT right shoulder 11/22/2017 FINDINGS: Vascular: The visualized axillary, brachial, radial, and ulnar arteries are widely patent. Perforating muscular branches are also patent. No evidence of active contrast extravasation. Soft tissues: Mixed density expansion of the deltoid and triceps muscles consistent with intramuscular hematoma. Similar appearance to previous CT study. See additional report. Bones: No acute fracture or dislocation is demonstrated in the shoulder, humerus, radius, or ulna. Visualized ribs appear intact. There is gas in the glenohumeral joint which could result from vacuum phenomenon and degenerative change or may be iatrogenic. No other soft tissue gas collections identified. Imaging incidentally includes the right groin region. In the right groin and extending inferiorly along the anterior right leg, there is evidence of contrast extravasation into the soft tissues, suggesting a  contrast injection into an IV in the right leg. Extravasated contrast tracks along the anterior musculature and measures about 2.2 x 6.1 cm transversely and at least 21 cm longitudinally. There appears to be an ice pack over the site. I called the CT tech, who indicated that the patient was being monitored in the ED. Review of the chart indicates orders for postprocedure site assessment, extremity elevation, and ice application order by Dr. Quentin Cornwall. Review of the MIP images confirms the above findings. IMPRESSION: Intramuscular hematoma involving the deltoid and triceps muscles. No evidence of active contrast extravasation. No acute fracture or dislocation. Examination was complicated by contrast extravasation into the right leg. Patient is monitored in the ED. Electronically Signed   By: Lucienne Capers M.D.   On: 11/22/2017 23:12   Ct Shoulder Right Wo Contrast  Result Date:  11/22/2017 CLINICAL DATA:  Patient fell a couple of feet off a scooter landing on right side. Right shoulder pain. EXAM: CT OF THE UPPER RIGHT EXTREMITY WITHOUT CONTRAST TECHNIQUE: Multidetector CT imaging of the upper right extremity was performed according to the standard protocol. COMPARISON:  None. FINDINGS: Bones/Joint/Cartilage Diffuse sclerotic appearance of the included cervical and upper thoracic spine compatible with chronic renal osteodystrophy. Osteoarthritis of the [facility] and glenohumeral joints. No acute displaced fracture or joint dislocation. Subcortical cystic change of the superolateral humeral head. Subchondral cystic change of the glenoid., distal clavicle and acromion consistent with osteoarthritis. No acute fracture of the proximal humerus, scapula, or clavicle. ACDF of the lower cervical spine. Ligaments Suboptimally assessed by CT. Muscles and Tendons Swelling of the deltoid muscle with heterogeneous intramuscular hematoma noted within. This measures approximately 7.4 x 5.8 x 10.1 cm. Subtle enlargement of the included triceps is also noted consistent with intramuscular hematoma. Assessment of the rotator cuff tendons is limited CT technique. Soft tissues Mild soft tissue edema about the right shoulder consistent with posttraumatic change. IMPRESSION: 1. Swelling of the deltoid muscle with intramuscular hematoma measuring 7.4 x 5.8 x 10.1 cm. Subtle enlargement of the included triceps tendons consistent with intramuscular hematoma. 2. No acute fracture or joint dislocation. 3. Stigmata of chronic renal osteodystrophy. Electronically Signed   By: Ashley Royalty M.D.   On: 11/22/2017 21:29   Dg Knee Complete 4 Views Right  Result Date: 11/22/2017 CLINICAL DATA:  Mechanical fall at home today, was going down a ramp in a wheelchair, struck edge of a barrier and toppled from wheelchair, swelling RIGHT shoulder, swelling and abrasion RIGHT knee, initial encounter EXAM: RIGHT KNEE - COMPLETE 4+ VIEW  COMPARISON:  None FINDINGS: Diffuse osseous demineralization. Joint spaces preserved. No acute fracture, dislocation, or bone destruction. Extensive scattered atherosclerotic calcifications. No knee joint effusion. IMPRESSION: Osseous demineralization without acute bony abnormalities. Electronically Signed   By: Lavonia Dana M.D.   On: 11/22/2017 12:49     Medications:    . amLODipine  5 mg Oral Once per day on Sun Mon Wed Fri  . atorvastatin  20 mg Oral Daily  . calcium acetate  1,334 mg Oral TID WC  . carvedilol  25 mg Oral BID WC  . Chlorhexidine Gluconate Cloth  6 each Topical Q0600  . cinacalcet  90 mg Oral Q breakfast  . losartan  50 mg Oral Daily  . mirtazapine  15 mg Oral QHS  . pantoprazole  20 mg Oral BID  . sertraline  150 mg Oral QHS  . sevelamer carbonate  0.8 g Oral TID WC  . traZODone  100 mg Oral  QHS   acetaminophen **OR** acetaminophen, cyclobenzaprine, hydrALAZINE, HYDROmorphone (DILAUDID) injection, hydrOXYzine, ondansetron **OR** ondansetron (ZOFRAN) IV, oxyCODONE  Assessment/ Plan:  [name] is a 62 y.o. 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   Admitted for right shoulder trauma.   MWF [place] left AVF   1. End stage renal disease on hemodialysis MWF Seen and examined on hemodialysis. Tolerating treatment well. UF goal 1.5.  - requesting records from [facility]  2. Anemia of chronic kidney disease: hemoglobin 9.3 Patient did not receive EPO as we were not able to confirm his outpatient regimen.  3. Hypertension: blood pressure at goal. Current regimen of amlodipine, carvedilol Hydralazine PRN  4. Secondary Hyperparathyroidism:  - sevelamer and calcium acetate with meals - cinacalcet.     LOS: 0 Asma Boldon 11/20/20194:01 PM

## 2017-11-23 NOTE — Progress Notes (Signed)
Patient ID: William Carpenter, male   DOB: Jan 12, 1955, 62 y.o.   MRN: [id number]      Chief Complaint: Patient was seen in consultation today for f/u contrast extravasation  Referring Physician(s): Randel Pigg  Supervising Physician: Arne Cleveland  Patient Status: [facility] - In-pt  History of Present Illness: William Carpenter is a 62 y.o. male admitted through the ED last night secondary to mechanical fall and shoulder trauma.  Because of expanding right upper extremity hematoma, CTA was requested.  Patient had extravasation at the site of her right femoral peripheral IV which been placed.  The ED physician was notified of the extravasation, and the extravasation site was treated conservatively with icing and elevation as able.  A central line was placed, and the CTA was able to be completed.  He did well overnight.  He is having no pain at the extravasation site.  No right lower extremity numbness, tingling, pain, or weakness.  Past Medical History:  Diagnosis Date  . Bullous dermatosis   . Dialysis patient ([place])   . Dysphagia   . FSGS (focal segmental glomerulosclerosis)   . Hyperlipidemia   . Renal insufficiency   . Spinal stenosis, cervical region     Past Surgical History:  Procedure Laterality Date  . CARPAL TUNNEL RELEASE    . SPINAL FUSION  2018  . spleen  2015   removed    Allergies: Albumin (human); Aloe; Iodinated diagnostic agents; Iron dextran; Metoprolol tartrate; Penicillins; Quinine sulfate [quinine]; Rabeprazole; Sulfa antibiotics; Tape; and Trimethoprim  Medications: Prior to Admission medications   Medication Sig Start Date End Date Taking? Authorizing Provider  acetaminophen (TYLENOL) 325 MG tablet Take 650 mg by mouth every 8 (eight) hours as needed (pain or fever).     [provider]  amLODipine (NORVASC) 5 MG tablet Take 5 mg by mouth daily. Takes only M-W-F-Sun    [provider]  atorvastatin (LIPITOR) 40 MG tablet Take 20 mg by mouth  daily.     [provider]  b complex-vitamin c-folic acid (NEPHRO-VITE) 0.8 MG TABS tablet Take 1 tablet by mouth at bedtime.    [provider]  betamethasone dipropionate (DIPROLENE) 0.05 % ointment Apply topically 2 (two) times daily.    [provider]  bisacodyl (DULCOLAX) 10 MG suppository Place 10 mg rectally daily as needed for moderate constipation.    [provider]  calcium acetate (PHOSLO) 667 MG capsule Take 1,334 mg by mouth 3 (three) times daily with meals.     [provider]  camphor-menthol Timoteo Ace) lotion Apply 1 application topically as needed for itching.    [provider]  Carboxymethylcellul-Glycerin (REFRESH OPTIVE SENSITIVE OP) Place 1 drop into both eyes every 4 (four) hours.    [provider]  carvedilol (COREG) 25 MG tablet Take 25 mg by mouth 2 (two) times daily with a meal.     [provider]  cetirizine (ZYRTEC) 10 MG tablet Take 10 mg by mouth daily.    [provider]  cinacalcet (SENSIPAR) 90 MG tablet Take 90 mg by mouth daily.    [provider]  clobetasol ointment (TEMOVATE) 1.63 % Apply 1 application topically 2 (two) times daily.    [provider]  cycloSPORINE (RESTASIS) 0.05 % ophthalmic emulsion Place 1 drop into both eyes 2 (two) times daily.    [provider]  diphenhydrAMINE (BENADRYL) 25 mg capsule Take 25 mg by mouth 2 (two) times daily as needed.  [provider]  docusate sodium (COLACE) 100 MG capsule Take 100 mg by mouth daily.    [provider]  dorzolamidel-timolol (COSOPT) 22.3-6.8 MG/ML SOLN ophthalmic solution 1 drop 2 (two) times daily.    [provider]  doxycycline (VIBRAMYCIN) 100 MG capsule Take 100 mg by mouth 2 (two) times daily.    [provider]  fluorometholone (EFLONE) 0.1 % ophthalmic suspension 1 drop 4 (four) times daily.    [provider]  gabapentin (NEURONTIN) 300  MG capsule Take 100 mg by mouth at bedtime.    [provider]  hydrALAZINE (APRESOLINE) 10 MG tablet Take 10 mg by mouth every 4 (four) hours as needed (systolic BP >[id number]).     [provider]  hydrOXYzine (VISTARIL) 25 MG capsule Take 25 mg by mouth 3 (three) times daily as needed.    [provider]  ketoconazole (NIZORAL) 2 % shampoo Apply 1 application topically 2 (two) times a week.    [provider]  ketorolac (ACULAR) 0.4 % SOLN 1 drop 4 (four) times daily.    [provider]  lactulose (CHRONULAC) 10 GM/15ML solution Take by mouth 3 (three) times daily.    [provider]  latanoprost (XALATAN) 0.005 % ophthalmic solution Place 1 drop into both eyes at bedtime.    [provider]  losartan (COZAAR) 50 MG tablet Take 50 mg by mouth daily.    [provider]  Melatonin 5 MG CAPS Take by mouth.    [provider]  methocarbamol (ROBAXIN) 500 MG tablet Take 500 mg by mouth 3 (three) times daily.     [provider]  mirtazapine (REMERON) 15 MG tablet Take 15 mg by mouth at bedtime.    [provider]  mupirocin cream (BACTROBAN) 2 % Apply 1 application topically 2 (two) times daily. 09/30/16   Frederich Cha, MD  mupirocin ointment (BACTROBAN) 2 % Apply 1 application topically 3 (three) times daily. 12/16/16   Lorin Picket, PA-C  naloxone [facility]) nasal spray 4 mg/0.1 mL Place 1 spray into the nose.    [provider]  naproxen (NAPROSYN) 500 MG tablet Take 500 mg by mouth 2 (two) times daily with a meal.    [provider]  ofloxacin (OCUFLOX) 0.3 % ophthalmic solution 1 drop 4 (four) times daily.    [provider]  omeprazole (PRILOSEC) 20 MG capsule Take 20 mg by mouth 2 (two) times daily before a meal.    [provider]  oxycodone (OXY-IR) 5 MG capsule Take 5-10 mg by mouth every 4 (four) hours as needed for pain.     [provider]    polyvinyl alcohol (LIQUIFILM TEARS) 1.4 % ophthalmic solution Place 1 drop into both eyes 4 (four) times daily as needed for dry eyes.    [provider]  prednisoLONE acetate (PRED FORTE) 1 % ophthalmic suspension 1 drop 4 (four) times daily.    [provider]  predniSONE (DELTASONE) 50 MG tablet Take 50 mg by mouth daily with breakfast.    [provider]  Psyllium (KONSYL-D PO) Take 5 mLs by mouth 2 (two) times daily.    [provider]  senna-docusate (SENOKOT-S) 8.6-50 MG tablet Take 1 tablet by mouth 2 (two) times daily as needed (to prevent constipation; scheduled until bowel movement).     [provider]  sertraline (ZOLOFT) 50 MG tablet Take 150 mg by mouth at bedtime.    [provider]  sevelamer carbonate (RENVELA) 0.8 g PACK packet Take 0.8 g by mouth 3 (three) times daily with meals.    [provider]  simethicone (MYLICON) 80 MG chewable tablet Chew 80 mg by mouth 3 (three) times daily.    [provider]  terbinafine (LAMISIL) 1 % cream Apply 1 application topically 2 (two) times daily.    [provider]  timolol (BETIMOL) 0.5 % ophthalmic solution Place 1 drop into both eyes daily.    [provider]  traZODone (DESYREL) 100 MG tablet Take 100 mg by mouth at bedtime.    [provider]  vancomycin IVPB Inject into the vein.    [provider]     Family History  Problem Relation Age of Onset  . Cancer Mother   . COPD Mother   . Heart disease Father   . Parkinson's disease Father     Social History   Socioeconomic History  . Marital status: Married    Spouse name: Not on file  . Number of children: Not on file  . Years of education: Not on file  . Highest education level: Not on file  Occupational History  . Not on file  Social Needs  . Financial resource strain: Not on file  . Food insecurity:    Worry: Not on file    Inability: Not on file  .  Transportation needs:    Medical: Not on file    Non-medical: Not on file  Tobacco Use  . Smoking status: Never Smoker  . Smokeless tobacco: Never Used  Substance and Sexual Activity  . Alcohol use: No  . Drug use: No  . Sexual activity: Not on file  Lifestyle  . Physical activity:    Days per week: Not on file    Minutes per session: Not on file  . Stress: Not on file  Relationships  . Social connections:    Talks on phone: Not on file    Gets together: Not on file    Attends religious service: Not on file    Active member of club or organization: Not on file    Attends meetings of clubs or organizations: Not on file    Relationship status: Not on file  Other Topics Concern  . Not on file  Social History Narrative  . Not on file       Review of Systems: A 12 point ROS discussed and pertinent positives are indicated in the HPI above.  All other systems are negative.  Review of Systems  Vital Signs: BP (!) 143/65 (BP Location: Right Arm)   Pulse 72   Temp 97.8 F (36.6 C) (Oral)   Resp 18   Ht 5\' 9"  (1.753 m)   Wt 77.1 kg   SpO2 100%   BMI 25.10 kg/m   Physical Exam Constitutional: Oriented to person, place, and time. Well-developed and well-nourished. No distress. Eating breakfast. Last Weight  Most recent update: 11/23/2017  1:36 AM   Weight  77.1 kg (169 lb 15.6 oz)           HENT:  Head: Normocephalic and atraumatic.  Eyes: Conjunctivae and EOM are normal. Right eye exhibits no discharge. Left eye exhibits no discharge. No scleral icterus.  Neck: No JVD present.  Pulmonary/Chest: Effort normal. No stridor. No respiratory distress. Ice pack over R shoulder. Abdomen: soft, non distended Neurological:  alert and oriented to person, place, and time.  Skin: Skin is warm and dry.  not diaphoretic.  Psychiatric:   normal mood and affect.   behavior is normal. Judgment and thought content normal.  RLE: no skin changes, swelling, bruising, or tenderness in R  groin and thight region of previous extravasation  Imaging: Dg Chest 2 View  Result Date: 11/22/2017 CLINICAL DATA:  Right-sided rib pain after fall. EXAM: CHEST - 2 VIEW; RIGHT RIBS - 2 VIEW COMPARISON:  Chest x-ray dated December 16, 2016. CT chest dated July 26, 2016. FINDINGS: No fracture or other bone lesions are seen involving the ribs. There is no evidence of pneumothorax or pleural effusion. Mildly coarsened interstitial markings are similar to prior studies. The lungs are otherwise clear. Heart size and mediastinal contours are within normal limits. IMPRESSION: 1. No acute rib fracture. 2.  No active cardiopulmonary disease. Electronically Signed   By: Titus Dubin M.D.   On: 11/22/2017 13:48   Dg Ribs Unilateral Right  Result Date: 11/22/2017 CLINICAL DATA:  Right-sided rib pain after fall. EXAM: CHEST - 2 VIEW; RIGHT RIBS - 2 VIEW COMPARISON:  Chest x-ray dated December 16, 2016. CT chest dated July 26, 2016. FINDINGS: No fracture or other bone lesions are seen involving the ribs. There is no evidence of pneumothorax or pleural effusion. Mildly coarsened interstitial markings are similar to prior studies. The lungs are otherwise clear. Heart size and mediastinal contours are within normal limits. IMPRESSION: 1. No acute rib fracture. 2.  No active cardiopulmonary disease. Electronically Signed   By: Titus Dubin M.D.   On: 11/22/2017 13:48   Dg Thoracic Spine 2 View  Result Date: 11/22/2017 CLINICAL DATA:  Upper back pain after fall. EXAM: THORACIC SPINE 2 VIEWS COMPARISON:  CT chest dated July 26, 2016. FINDINGS: Twelve rib-bearing thoracic vertebral bodies. No acute fracture or subluxation. Vertebral body heights are preserved. Mild levocurvature of the upper thoracic spine is unchanged. Sagittal alignment is normal. Intervertebral disc spaces are relatively maintained. Partially visualized C3-C6 ACDF with unchanged fractured screws at C3 and C6. IMPRESSION: 1.  No acute osseous  abnormality. Electronically Signed   By: Titus Dubin M.D.   On: 11/22/2017 13:41   Dg Shoulder Right  Result Date: 11/22/2017 CLINICAL DATA:  Mechanical fall today, was going down a ramp in a wheelchair, wheelchair struck edge of a barrier and toppled, RIGHT shoulder swelling, abrasion and swelling RIGHT knee EXAM: RIGHT SHOULDER - 2+ VIEW COMPARISON:  None FINDINGS: Diffuse osseous demineralization. Degenerative changes at the RIGHT [place] joint. No acute fracture, dislocation, or bone destruction. Visualized RIGHT ribs intact. IMPRESSION: No acute osseous abnormalities. Osseous demineralization with degenerative changes RIGHT AC joint. Electronically Signed   By: Lavonia Dana M.D.   On: 11/22/2017 12:48   Ct Cervical Spine Wo Contrast  Result Date: 11/22/2017 CLINICAL DATA:  Neck pain after mechanical fall today. EXAM: CT CERVICAL SPINE WITHOUT CONTRAST TECHNIQUE: Multidetector CT imaging of the cervical spine was performed without intravenous contrast. Multiplanar CT image reconstructions were also generated. COMPARISON:  07/26/2016 FINDINGS: Alignment: Mild C3-4 retrolisthesis, chronic appearing Skull base and vertebrae: Negative for acute fracture. Diffusely sclerotic skeleton with patchy lucency. Subligamentous erosion of the subclavius attachment sites. There has been no progression since 07/26/2016, findings remain consistent with renal osteodystrophy. Soft tissues and spinal canal: No prevertebral fluid or swelling. No visible canal hematoma. There are prominent vessels in the bilateral supraclavicular fossae, suspect this is from dialysis access. Disc levels: C3-4, C4-5 and C5-6 ACDF. Suboptimal healing with C3 right-sided screw fracture, although arthrodesis has been achieved at the C3-4 level (  see coronal reformats). There has also C6 screw fracture on the right, and loosening of the C6 screw on the left. Diffuse cage subsidence with abutment of the inferior plate against the C7 anterior  superior corner, with spurring. No solid arthrodesis is seen at C5-6 or C4-5. Upper chest: Negative IMPRESSION: 1. Negative for acute fracture. 2. Renal osteodystrophy. 3. C3-4, C4-5, and C5-6 ACDF. 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 Hardware failure is also seen at C3, although there is solid arthrodesis at C3-4. Electronically Signed   By: Monte Fantasia M.D.   On: 11/22/2017 13:21   Ct Angio Up Extrem Right W &/or Wo Contrast  Result Date: 11/22/2017 CLINICAL DATA:  Swelling to the right shoulder after a fall today. EXAM: CT ANGIOGRAPHY UPPER RIGHT EXTREMITY TECHNIQUE: CT angiography of the right upper extremity is obtained with axial images from shoulder to wrist after contrast administration. Additional sagittal and coronal multiplanar reformatted images are obtained. CONTRAST:  100 mL Isovue 370 COMPARISON:  CT right shoulder 11/22/2017 FINDINGS: Vascular: The visualized axillary, brachial, radial, and ulnar arteries are widely patent. Perforating muscular branches are also patent. No evidence of active contrast extravasation. Soft tissues: Mixed density expansion of the deltoid and triceps muscles consistent with intramuscular hematoma. Similar appearance to previous CT study. See additional report. Bones: No acute fracture or dislocation is demonstrated in the shoulder, humerus, radius, or ulna. Visualized ribs appear intact. There is gas in the glenohumeral joint which could result from vacuum phenomenon and degenerative change or may be iatrogenic. No other soft tissue gas collections identified. Imaging incidentally includes the right groin region. In the right groin and extending inferiorly along the anterior right leg, there is evidence of contrast extravasation into the soft tissues, suggesting a contrast injection into an IV in the right leg. Extravasated contrast tracks along the anterior musculature and measures about 2.2  x 6.1 cm transversely and at least 21 cm longitudinally. There appears to be an ice pack over the site. I called the CT tech, who indicated that the patient was being monitored in the ED. Review of the chart indicates orders for postprocedure site assessment, extremity elevation, and ice application order by Dr. Quentin Cornwall. Review of the MIP images confirms the above findings. IMPRESSION: Intramuscular hematoma involving the deltoid and triceps muscles. No evidence of active contrast extravasation. No acute fracture or dislocation. Examination was complicated by contrast extravasation into the right leg. Patient is monitored in the ED. Electronically Signed   By: Lucienne Capers M.D.   On: 11/22/2017 23:12   Ct Shoulder Right Wo Contrast  Result Date: 11/22/2017 CLINICAL DATA:  Patient fell a couple of feet off a scooter landing on right side. Right shoulder pain. EXAM: CT OF THE UPPER RIGHT EXTREMITY WITHOUT CONTRAST TECHNIQUE: Multidetector CT imaging of the upper right extremity was performed according to the standard protocol. COMPARISON:  None. FINDINGS: Bones/Joint/Cartilage Diffuse sclerotic appearance of the included cervical and upper thoracic spine compatible with chronic renal osteodystrophy. Osteoarthritis of the [facility] and glenohumeral joints. No acute displaced fracture or joint dislocation. Subcortical cystic change of the superolateral humeral head. Subchondral cystic change of the glenoid., distal clavicle and acromion consistent with osteoarthritis. No acute fracture of the proximal humerus, scapula, or clavicle. ACDF of the lower cervical spine. Ligaments Suboptimally assessed by CT. Muscles and Tendons Swelling of the deltoid muscle with heterogeneous intramuscular hematoma noted within. This measures approximately 7.4 x 5.8 x 10.1  cm. Subtle enlargement of the included triceps is also noted consistent with intramuscular hematoma. Assessment of the rotator cuff tendons is limited CT technique.  Soft tissues Mild soft tissue edema about the right shoulder consistent with posttraumatic change. IMPRESSION: 1. Swelling of the deltoid muscle with intramuscular hematoma measuring 7.4 x 5.8 x 10.1 cm. Subtle enlargement of the included triceps tendons consistent with intramuscular hematoma. 2. No acute fracture or joint dislocation. 3. Stigmata of chronic renal osteodystrophy. Electronically Signed   By: Ashley Royalty M.D.   On: 11/22/2017 21:29   Dg Knee Complete 4 Views Right  Result Date: 11/22/2017 CLINICAL DATA:  Mechanical fall at home today, was going down a ramp in a wheelchair, struck edge of a barrier and toppled from wheelchair, swelling RIGHT shoulder, swelling and abrasion RIGHT knee, initial encounter EXAM: RIGHT KNEE - COMPLETE 4+ VIEW COMPARISON:  None FINDINGS: Diffuse osseous demineralization. Joint spaces preserved. No acute fracture, dislocation, or bone destruction. Extensive scattered atherosclerotic calcifications. No knee joint effusion. IMPRESSION: Osseous demineralization without acute bony abnormalities. Electronically Signed   By: Lavonia Dana M.D.   On: 11/22/2017 12:49    Labs:  CBC: Recent Labs    10/28/17 1125 11/22/17 1209 11/23/17 0246  WBC 8.3 7.9 6.9  HGB 10.1* 10.2* 9.3*  HCT 31.0* 31.3* 28.2*  PLT 189 200 203    COAGS: No results for input(s): INR, APTT in the last 8760 hours.  BMP: Recent Labs    10/28/17 1125 11/22/17 1209 11/23/17 0246  NA 135 136 137  K 4.7 4.1 4.9  CL 97* 96* 96*  CO2 25 29 28   GLUCOSE 126* 122* 113*  BUN 63* 47* 57*  CALCIUM 8.6* 8.8* 8.7*  CREATININE 5.64* 4.45* 5.11*  GFRNONAA 10* 13* 11*  GFRAA 11* 15* 13*    LIVER FUNCTION TESTS: Recent Labs    10/28/17 1125  ALBUMIN 3.4*    TUMOR MARKERS: No results for input(s): AFPTM, CEA, CA199, CHROMGRNA in the last 8760 hours.  Assessment and Plan:  My impression is that the patient is doing fine after right lower extremity IV contrast extravasation last  night.  There is no swelling at the site . No skin discoloration.  Distally neurovascular intact.  Essentially asymptomatic with respect] to the extravasation. Will sign off for now.  Please let us know [phone] if there are any future questions or problems about this episode, or if new symptoms  Arise that  could be related.   Thank you for this interesting consult.  I greatly enjoyed meeting William Carpenter and look forward to participating in their care.  A copy of this report was sent to the requesting provider on this date.  Electronically Signed: Rickard Rhymes, MD 11/23/2017, 10:02 AM   I spent a total of 20 Minutes    in face to face in clinical consultation, greater than 50% of which was counseling/coordinating care for right lower extremity contrast extravasation.

## 2017-11-24 DIAGNOSIS — Z515 Encounter for palliative care: Secondary | ICD-10-CM

## 2017-11-24 DIAGNOSIS — S40021A Contusion of right upper arm, initial encounter: Secondary | ICD-10-CM | POA: Diagnosis not present

## 2017-11-24 DIAGNOSIS — T148XXA Other injury of unspecified body region, initial encounter: Secondary | ICD-10-CM | POA: Diagnosis not present

## 2017-11-24 DIAGNOSIS — D631 Anemia in chronic kidney disease: Secondary | ICD-10-CM | POA: Diagnosis not present

## 2017-11-24 DIAGNOSIS — Z7189 Other specified counseling: Secondary | ICD-10-CM

## 2017-11-24 DIAGNOSIS — N2581 Secondary hyperparathyroidism of renal origin: Secondary | ICD-10-CM | POA: Diagnosis not present

## 2017-11-24 DIAGNOSIS — I120 Hypertensive chronic kidney disease with stage 5 chronic kidney disease or end stage renal disease: Secondary | ICD-10-CM | POA: Diagnosis not present

## 2017-11-24 DIAGNOSIS — E785 Hyperlipidemia, unspecified: Secondary | ICD-10-CM | POA: Diagnosis not present

## 2017-11-24 DIAGNOSIS — N186 End stage renal disease: Secondary | ICD-10-CM | POA: Diagnosis not present

## 2017-11-24 DIAGNOSIS — Z992 Dependence on renal dialysis: Secondary | ICD-10-CM | POA: Diagnosis not present

## 2017-11-24 DIAGNOSIS — I10 Essential (primary) hypertension: Secondary | ICD-10-CM | POA: Diagnosis not present

## 2017-11-24 LAB — HIV ANTIBODY (ROUTINE TESTING W REFLEX): HIV SCREEN 4TH GENERATION: NONREACTIVE

## 2017-11-24 NOTE — Progress Notes (Signed)
Hematoma seems smaller, cold therapy seems to help. Will need PT to work on ROM wthen pain is better.
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
## 2017-11-24 NOTE — Care Management (Signed)
Case discussed with utilization review team and physician advisor.

## 2017-11-25 DIAGNOSIS — Z79899 Other long term (current) drug therapy: Secondary | ICD-10-CM | POA: Diagnosis not present

## 2017-11-25 DIAGNOSIS — Z992 Dependence on renal dialysis: Secondary | ICD-10-CM | POA: Diagnosis not present

## 2017-11-25 DIAGNOSIS — Z888 Allergy status to other drugs, medicaments and biological substances status: Secondary | ICD-10-CM | POA: Diagnosis not present

## 2017-11-25 DIAGNOSIS — T148XXA Other injury of unspecified body region, initial encounter: Secondary | ICD-10-CM | POA: Diagnosis not present

## 2017-11-25 DIAGNOSIS — M25561 Pain in right knee: Secondary | ICD-10-CM | POA: Diagnosis present

## 2017-11-25 DIAGNOSIS — I120 Hypertensive chronic kidney disease with stage 5 chronic kidney disease or end stage renal disease: Secondary | ICD-10-CM | POA: Diagnosis not present

## 2017-11-25 DIAGNOSIS — M25511 Pain in right shoulder: Secondary | ICD-10-CM | POA: Diagnosis not present

## 2017-11-25 DIAGNOSIS — T80818A Extravasation of other vesicant agent, initial encounter: Secondary | ICD-10-CM | POA: Diagnosis not present

## 2017-11-25 DIAGNOSIS — Y9289 Other specified places as the place of occurrence of the external cause: Secondary | ICD-10-CM | POA: Diagnosis not present

## 2017-11-25 DIAGNOSIS — M542 Cervicalgia: Secondary | ICD-10-CM | POA: Diagnosis present

## 2017-11-25 DIAGNOSIS — N186 End stage renal disease: Secondary | ICD-10-CM | POA: Diagnosis present

## 2017-11-25 DIAGNOSIS — M4802 Spinal stenosis, cervical region: Secondary | ICD-10-CM | POA: Diagnosis present

## 2017-11-25 DIAGNOSIS — G8929 Other chronic pain: Secondary | ICD-10-CM | POA: Diagnosis present

## 2017-11-25 DIAGNOSIS — E785 Hyperlipidemia, unspecified: Secondary | ICD-10-CM | POA: Diagnosis present

## 2017-11-25 DIAGNOSIS — D631 Anemia in chronic kidney disease: Secondary | ICD-10-CM | POA: Diagnosis present

## 2017-11-25 DIAGNOSIS — K219 Gastro-esophageal reflux disease without esophagitis: Secondary | ICD-10-CM | POA: Diagnosis present

## 2017-11-25 DIAGNOSIS — S40021A Contusion of right upper arm, initial encounter: Secondary | ICD-10-CM | POA: Diagnosis present

## 2017-11-25 DIAGNOSIS — L139 Bullous disorder, unspecified: Secondary | ICD-10-CM | POA: Diagnosis present

## 2017-11-25 DIAGNOSIS — N2581 Secondary hyperparathyroidism of renal origin: Secondary | ICD-10-CM | POA: Diagnosis present

## 2017-11-25 DIAGNOSIS — Z7952 Long term (current) use of systemic steroids: Secondary | ICD-10-CM | POA: Diagnosis not present

## 2017-11-25 DIAGNOSIS — Z91041 Radiographic dye allergy status: Secondary | ICD-10-CM | POA: Diagnosis not present

## 2017-11-25 DIAGNOSIS — Z880 Allergy status to penicillin: Secondary | ICD-10-CM | POA: Diagnosis not present

## 2017-11-25 DIAGNOSIS — Z882 Allergy status to sulfonamides status: Secondary | ICD-10-CM | POA: Diagnosis not present

## 2017-11-25 DIAGNOSIS — N269 Renal sclerosis, unspecified: Secondary | ICD-10-CM | POA: Diagnosis present

## 2017-11-25 DIAGNOSIS — I10 Essential (primary) hypertension: Secondary | ICD-10-CM | POA: Diagnosis not present

## 2017-11-25 LAB — RENAL FUNCTION PANEL
ANION GAP: 14 (ref 5–15)
Albumin: 3.2 g/dL — ABNORMAL LOW (ref 3.5–5.0)
BUN: 66 mg/dL — ABNORMAL HIGH (ref 8–23)
CHLORIDE: 96 mmol/L — AB (ref 98–111)
CO2: 27 mmol/L (ref 22–32)
Calcium: 8.4 mg/dL — ABNORMAL LOW (ref 8.9–10.3)
Creatinine, Ser: 5.88 mg/dL — ABNORMAL HIGH (ref 0.61–1.24)
GFR calc Af Amer: 11 mL/min — ABNORMAL LOW (ref >60)
GFR, EST NON AFRICAN AMERICAN: 9 mL/min — AB (ref >60)
GLUCOSE: 99 mg/dL (ref 70–99)
Phosphorus: 4.7 mg/dL — ABNORMAL HIGH (ref 2.5–4.6)
Potassium: 4.3 mmol/L (ref 3.5–5.1)
Sodium: 137 mmol/L (ref 135–145)

## 2017-11-25 LAB — CBC
HEMATOCRIT: 26.9 % — AB (ref 39.0–52.0)
Hemoglobin: 8.6 g/dL — ABNORMAL LOW (ref 13.0–17.0)
MCH: 33.3 pg (ref 26.0–34.0)
MCHC: 32 g/dL (ref 30.0–36.0)
MCV: 104.3 fL — AB (ref 80.0–100.0)
Platelets: 188 10*3/uL (ref 150–400)
RBC: 2.58 MIL/uL — ABNORMAL LOW (ref 4.22–5.81)
RDW: 14.8 % (ref 11.5–15.5)
WBC: 9.4 10*3/uL (ref 4.0–10.5)
nRBC: 0 % (ref 0.0–0.2)

## 2017-11-25 NOTE — Progress Notes (Signed)
This note also relates to the following rows which could not be included: Pulse Rate - Cannot attach notes to unvalidated device data Resp - Cannot attach notes to unvalidated device data  Hd started  
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
## 2017-11-25 NOTE — Progress Notes (Signed)
[facility]  ROUNDING NOTE   Subjective:   Seen and examined on hemodialysis treatment.     HEMODIALYSIS FLOWSHEET:  Blood Flow Rate (mL/min): 300 mL/min Arterial Pressure (mmHg): -120 mmHg Venous Pressure (mmHg): 120 mmHg Transmembrane Pressure (mmHg): 60 mmHg Ultrafiltration Rate (mL/min): 180 mL/min Dialysate Flow Rate (mL/min): 600 ml/min Conductivity: Machine : 14 Conductivity: Machine : 14 Dialysis Fluid Bolus: Normal Saline Bolus Amount (mL): 250 mL    Objective:  Vital signs in last 24 hours:  Temp:  [98.3 F (36.8 C)-98.7 F (37.1 C)] 98.7 F (37.1 C) (11/22 1135) Pulse Rate:  [62-82] 79 (11/22 1315) Resp:  [13-20] 20 (11/22 1315) BP: (102-146)/(56-88) 115/71 (11/22 1315) SpO2:  [97 %-100 %] 100 % (11/22 1135)  Weight change:  Filed Weights   11/23/17 0134 11/23/17 1145 11/23/17 1545  Weight: 77.1 kg 79.6 kg 77.1 kg    Intake/Output: I/O last 3 completed shifts: In: 340 [P.O.:340] Out: 0    Intake/Output this shift:  Total I/O In: 120 [P.O.:120] Out: -   Physical Exam: General: NAD,   Head: Normocephalic, atraumatic. Moist oral mucosal membranes  Eyes: Anicteric, PERRL  Neck: Supple, trachea midline  Lungs:  Clear to auscultation  Heart: Regular rate and rhythm  Abdomen:  Soft, nontender,   Extremities: no peripheral edema. Right shoulder hematoma with ice pack  Neurologic: Nonfocal, moving all four extremities  Skin: No lesions  Access: Left AVF    Basic Metabolic Panel: Recent Labs  Lab 11/22/17 1209 11/23/17 0246 11/25/17 1147  NA 136 137 137  K 4.1 4.9 4.3  CL 96* 96* 96*  CO2 29 28 27   GLUCOSE 122* 113* 99  BUN 47* 57* 66*  CREATININE 4.45* 5.11* 5.88*  CALCIUM 8.8* 8.7* 8.4*  PHOS  --  4.8* 4.7*    Liver Function Tests: Recent Labs  Lab 11/25/17 1147  ALBUMIN 3.2*   No results for input(s): LIPASE, AMYLASE in the last 168 hours. No results for input(s): AMMONIA in the last 168 hours.  CBC: Recent  Labs  Lab 11/22/17 1209 11/23/17 0246 11/25/17 1147  WBC 7.9 6.9 9.4  NEUTROABS 2.4  --   --   HGB 10.2* 9.3* 8.6*  HCT 31.3* 28.2* 26.9*  MCV 102.0* 100.7* 104.3*  PLT 200 203 188    Cardiac Enzymes: No results for input(s): CKTOTAL, CKMB, CKMBINDEX, TROPONINI in the last 168 hours.  BNP: Invalid input(s): POCBNP  CBG: No results for input(s): GLUCAP in the last 168 hours.  Microbiology: Results for orders placed or performed during the hospital encounter of 07/12/16  Blood Culture (routine x 2)     Status: None   Collection Time: 07/12/16  6:29 PM  Result Value Ref Range Status   Specimen Description BLOOD BLOOD RIGHT FOREARM  Final   Special Requests   Final    BOTTLES DRAWN AEROBIC AND ANAEROBIC Blood Culture adequate volume   Culture NO GROWTH 5 DAYS  Final   Report Status 07/17/2016 FINAL  Final  Blood Culture (routine x 2)     Status: None   Collection Time: 07/12/16  6:29 PM  Result Value Ref Range Status   Specimen Description BLOOD RIGHT ANTECUBITAL  Final   Special Requests   Final    BOTTLES DRAWN AEROBIC AND ANAEROBIC Blood Culture adequate volume   Culture NO GROWTH 5 DAYS  Final   Report Status 07/17/2016 FINAL  Final  MRSA PCR Screening     Status: None   Collection Time:  07/13/16  3:32 AM  Result Value Ref Range Status   MRSA by PCR NEGATIVE NEGATIVE Final    Comment:        The GeneXpert MRSA Assay (FDA approved for NASAL specimens only), is one component of a comprehensive MRSA colonization surveillance program. It is not intended to diagnose MRSA infection nor to guide or monitor treatment for MRSA infections.   Aerobic Culture (superficial specimen)     Status: None   Collection Time: 07/16/16  3:00 PM  Result Value Ref Range Status   Specimen Description ARM  Final   Special Requests Normal  Final   Gram Stain   Final    RARE WBC PRESENT, PREDOMINANTLY MONONUCLEAR RARE GRAM POSITIVE COCCI IN PAIRS Performed at [facility], [address], [place], [place] [address]    Culture FEW METHICILLIN RESISTANT STAPHYLOCOCCUS AUREUS  Final   Report Status 07/19/2016 FINAL  Final   Organism ID, Bacteria METHICILLIN RESISTANT STAPHYLOCOCCUS AUREUS  Final      Susceptibility   Methicillin resistant staphylococcus aureus - MIC*    CIPROFLOXACIN >=8 RESISTANT Resistant     ERYTHROMYCIN >=8 RESISTANT Resistant     GENTAMICIN <=0.5 SENSITIVE Sensitive     OXACILLIN >=4 RESISTANT Resistant     TETRACYCLINE <=1 SENSITIVE Sensitive     VANCOMYCIN <=0.5 SENSITIVE Sensitive     TRIMETH/SULFA <=10 SENSITIVE Sensitive     CLINDAMYCIN <=0.25 SENSITIVE Sensitive     RIFAMPIN <=0.5 SENSITIVE Sensitive     Inducible Clindamycin NEGATIVE Sensitive     * FEW METHICILLIN RESISTANT STAPHYLOCOCCUS AUREUS    Coagulation Studies: No results for input(s): LABPROT, INR in the last 72 hours.  Urinalysis: No results for input(s): COLORURINE, LABSPEC, PHURINE, GLUCOSEU, HGBUR, BILIRUBINUR, KETONESUR, PROTEINUR, UROBILINOGEN, NITRITE, LEUKOCYTESUR in the last 72 hours.  Invalid input(s): APPERANCEUR    Imaging: No results found.   Medications:    . amLODipine  5 mg Oral Once per day on Sun Mon Wed Fri  . atorvastatin  20 mg Oral Daily  . calcium acetate  1,334 mg Oral TID WC  . carvedilol  25 mg Oral BID WC  . Chlorhexidine Gluconate Cloth  6 each Topical Q0600  . cinacalcet  90 mg Oral Q breakfast  . losartan  50 mg Oral Daily  . mirtazapine  15 mg Oral QHS  . pantoprazole  20 mg Oral BID  . sertraline  150 mg Oral QHS  . sevelamer carbonate  0.8 g Oral TID WC  . traZODone  100 mg Oral QHS   acetaminophen **OR** acetaminophen, ALPRAZolam, cyclobenzaprine, hydrALAZINE, HYDROmorphone (DILAUDID) injection, ondansetron **OR** ondansetron (ZOFRAN) IV, oxyCODONE  Assessment/ Plan:  William Carpenter is a 62 y.o. white male with end stage renal disease on hemodialysis secondary to FSGS, hypertension, autoimmune skin  condition.   Admitted for right shoulder trauma.   MWF [place] left AVF   1. End stage renal disease on hemodialysis MWF Tolerating treatment well.   2. Anemia of chronic kidney disease:   Patient did not receive EPO    3. Hypertension: blood pressure at goal. Current regimen of amlodipine, carvedilol Hydralazine PRN  4. Secondary Hyperparathyroidism:  - sevelamer and calcium acetate with meals - cinacalcet.    LOS: 0 Allanah Mcfarland 11/22/20191:30 PM

## 2017-11-25 NOTE — Care Management (Signed)
Patient for discharge home today with home health physical therapy with [facility].  Notified agency.  Notified Elvera Bicker with [facility] of discharge.  Spoke with patient's daughter Juliann Pulse and she will transport.  Patient has walker at home.

## 2017-11-25 NOTE — Progress Notes (Signed)
Discharge summary reviewed with verbal understanding. Escorted to personal vehicle.

## 2017-11-25 NOTE — Discharge Instructions (Signed)
Resume your HD as before

## 2017-12-03 DIAGNOSIS — M5030 Other cervical disc degeneration, unspecified cervical region: Secondary | ICD-10-CM | POA: Diagnosis not present

## 2017-12-03 DIAGNOSIS — G8929 Other chronic pain: Secondary | ICD-10-CM | POA: Diagnosis not present

## 2017-12-03 DIAGNOSIS — M4712 Other spondylosis with myelopathy, cervical region: Secondary | ICD-10-CM | POA: Diagnosis not present

## 2017-12-03 DIAGNOSIS — I120 Hypertensive chronic kidney disease with stage 5 chronic kidney disease or end stage renal disease: Secondary | ICD-10-CM | POA: Diagnosis not present

## 2017-12-03 DIAGNOSIS — M48062 Spinal stenosis, lumbar region with neurogenic claudication: Secondary | ICD-10-CM | POA: Diagnosis not present

## 2017-12-03 DIAGNOSIS — N186 End stage renal disease: Secondary | ICD-10-CM | POA: Diagnosis not present

## 2017-12-06 DIAGNOSIS — M5030 Other cervical disc degeneration, unspecified cervical region: Secondary | ICD-10-CM | POA: Diagnosis not present

## 2017-12-06 DIAGNOSIS — I120 Hypertensive chronic kidney disease with stage 5 chronic kidney disease or end stage renal disease: Secondary | ICD-10-CM | POA: Diagnosis not present

## 2017-12-06 DIAGNOSIS — N186 End stage renal disease: Secondary | ICD-10-CM | POA: Diagnosis not present

## 2017-12-06 DIAGNOSIS — G8929 Other chronic pain: Secondary | ICD-10-CM | POA: Diagnosis not present

## 2017-12-06 DIAGNOSIS — M48062 Spinal stenosis, lumbar region with neurogenic claudication: Secondary | ICD-10-CM | POA: Diagnosis not present

## 2017-12-06 DIAGNOSIS — M4712 Other spondylosis with myelopathy, cervical region: Secondary | ICD-10-CM | POA: Diagnosis not present

## 2017-12-08 DIAGNOSIS — M48062 Spinal stenosis, lumbar region with neurogenic claudication: Secondary | ICD-10-CM | POA: Diagnosis not present

## 2017-12-08 DIAGNOSIS — M4712 Other spondylosis with myelopathy, cervical region: Secondary | ICD-10-CM | POA: Diagnosis not present

## 2017-12-08 DIAGNOSIS — M5030 Other cervical disc degeneration, unspecified cervical region: Secondary | ICD-10-CM | POA: Diagnosis not present

## 2017-12-08 DIAGNOSIS — G8929 Other chronic pain: Secondary | ICD-10-CM | POA: Diagnosis not present

## 2017-12-08 DIAGNOSIS — I120 Hypertensive chronic kidney disease with stage 5 chronic kidney disease or end stage renal disease: Secondary | ICD-10-CM | POA: Diagnosis not present

## 2017-12-08 DIAGNOSIS — N186 End stage renal disease: Secondary | ICD-10-CM | POA: Diagnosis not present

## 2017-12-12 IMAGING — CR DG WRIST COMPLETE 3+V*L*
4 series · 4 of 4 positions shown · non-contrast
Comparison: None.

CLINICAL DATA: Fall 5 days ago.  Dialysis patient

EXAM:
LEFT WRIST - COMPLETE 3+ VIEW

[wrist pa]
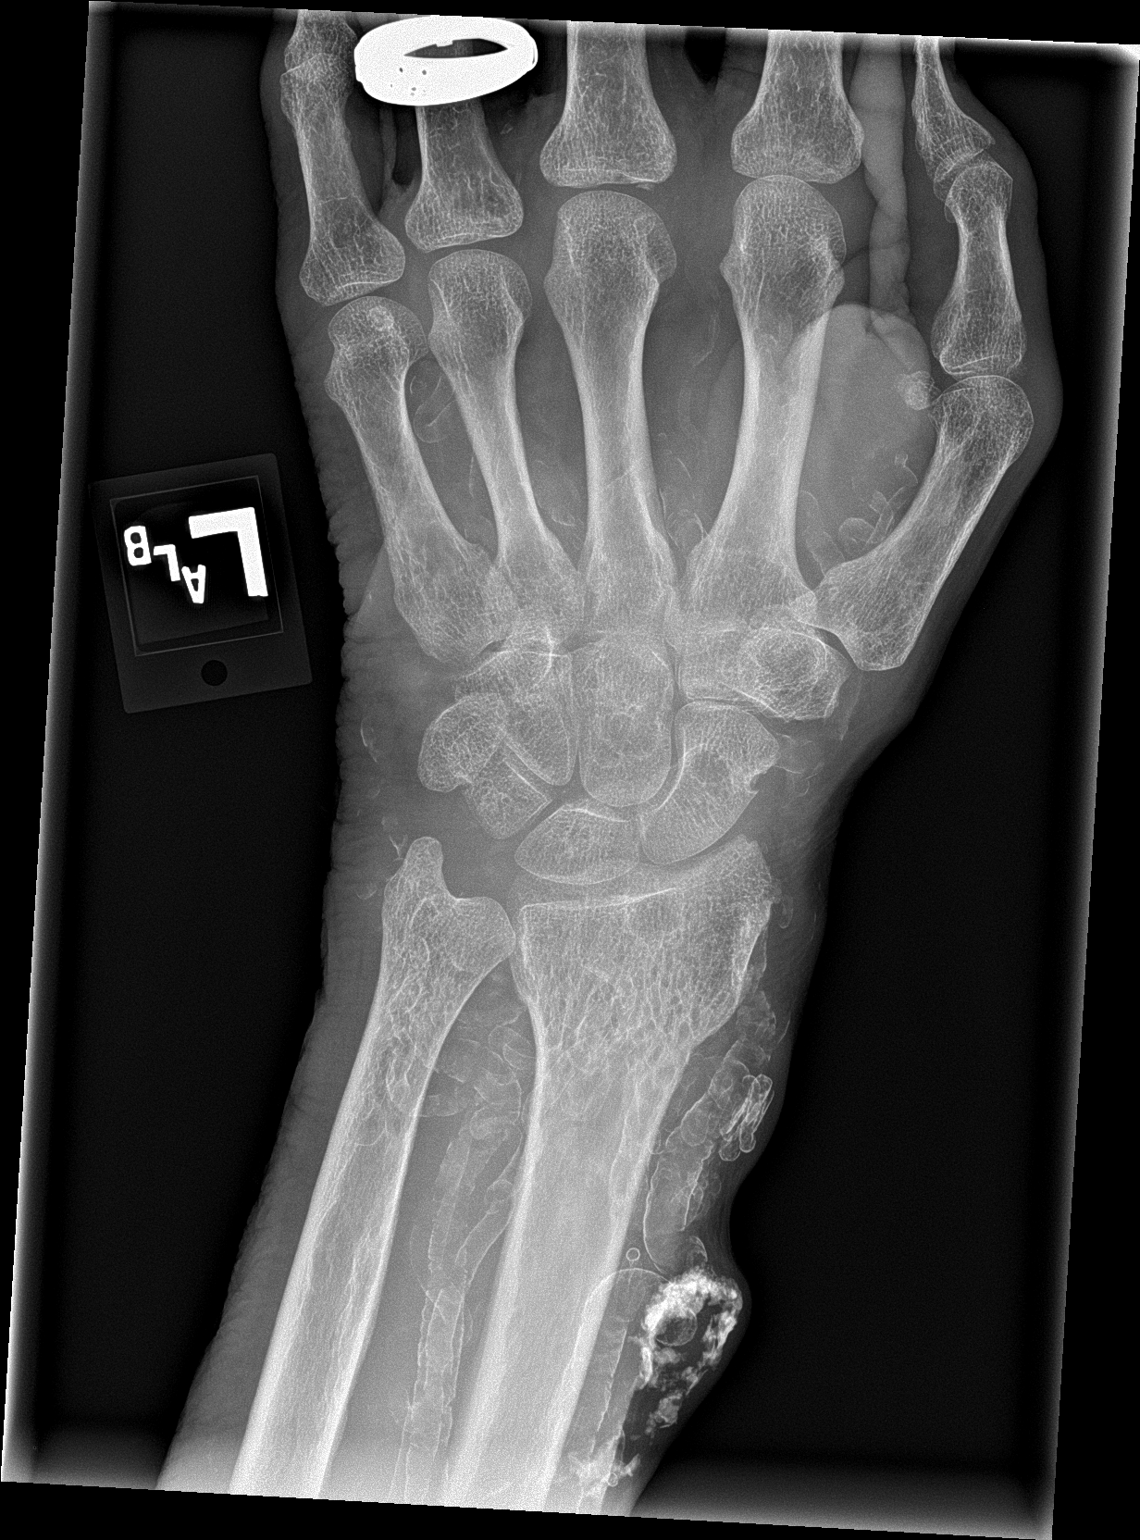

[wrist obl]
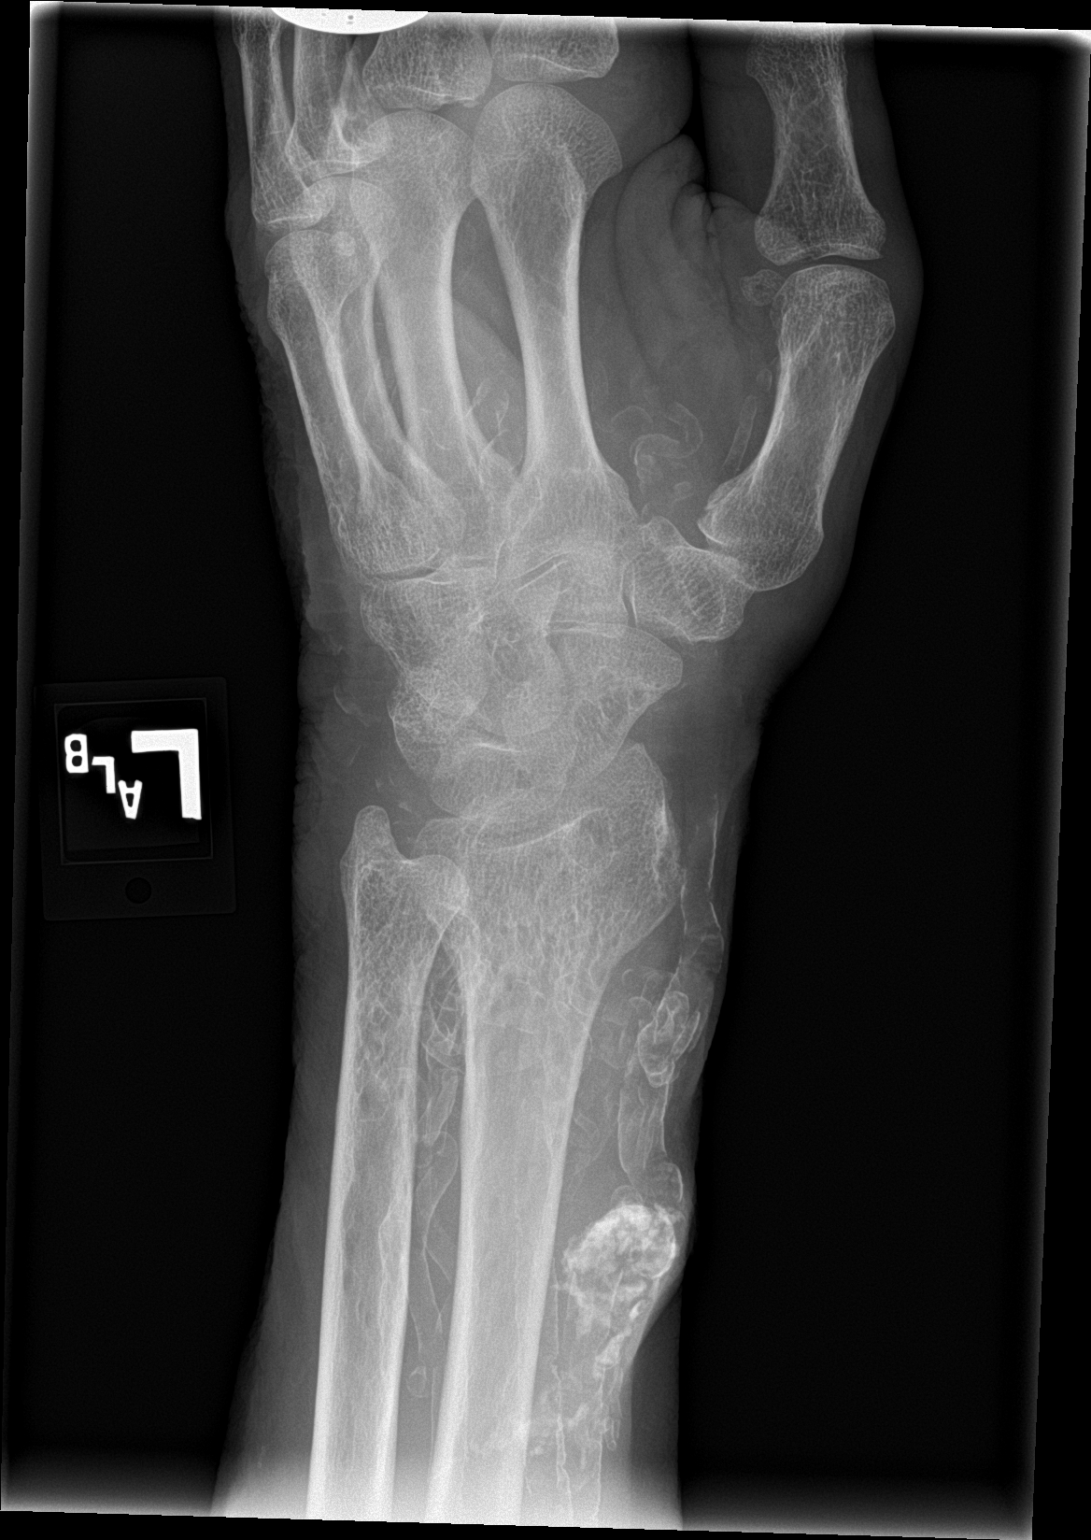

[wrist lat]
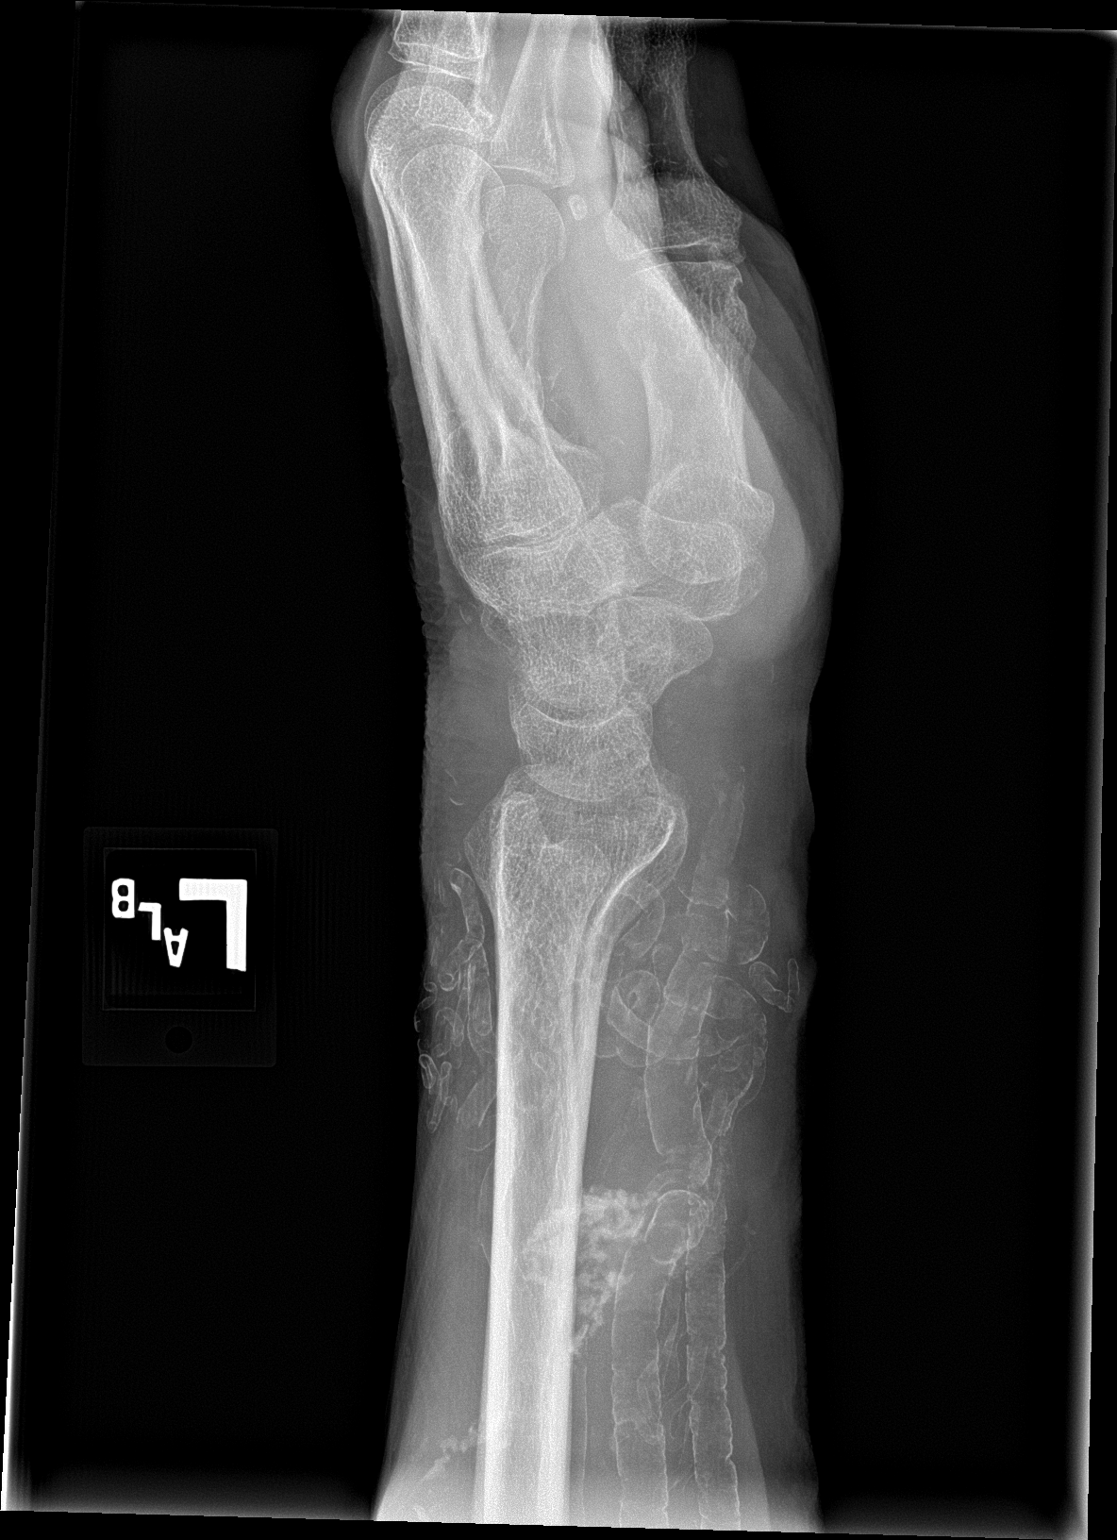

[wrist navicular]
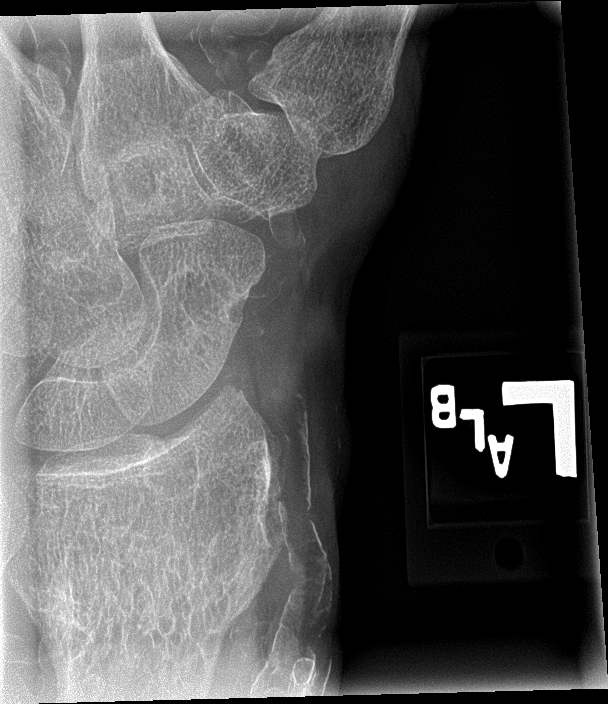

[4 of 4 positions shown; findings below may reference images not displayed]

FINDINGS: Normal alignment.  No fracture.

Extensive vascular calcification related to dialysis fistula. Soft
tissue calcification medially consistent with chronic hematoma.
IMPRESSION: Negative for fracture

## 2017-12-12 IMAGING — CR DG ELBOW COMPLETE 3+V*L*
5 series · 7 of 7 positions shown · non-contrast
Comparison: None.

CLINICAL DATA: Fall 5 days ago.  Bruising.  Dialysis patient

EXAM:
LEFT ELBOW - COMPLETE 3+ VIEW

[elbow ap]
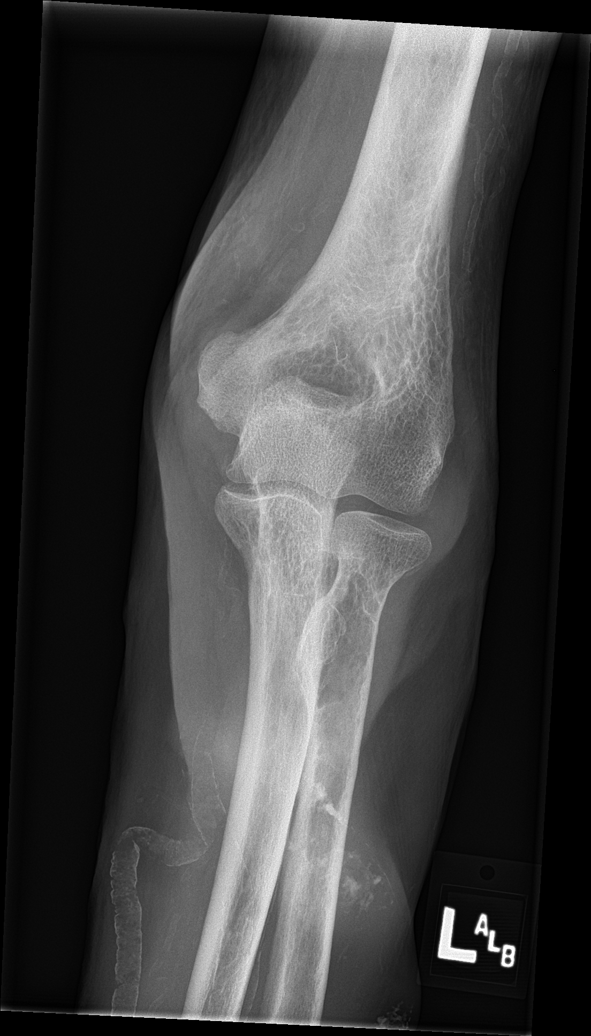

[Series 2: elbow obl · 0.14mm/px · 2 of 2 slices shown (1 of 2)]
[im 1/2]
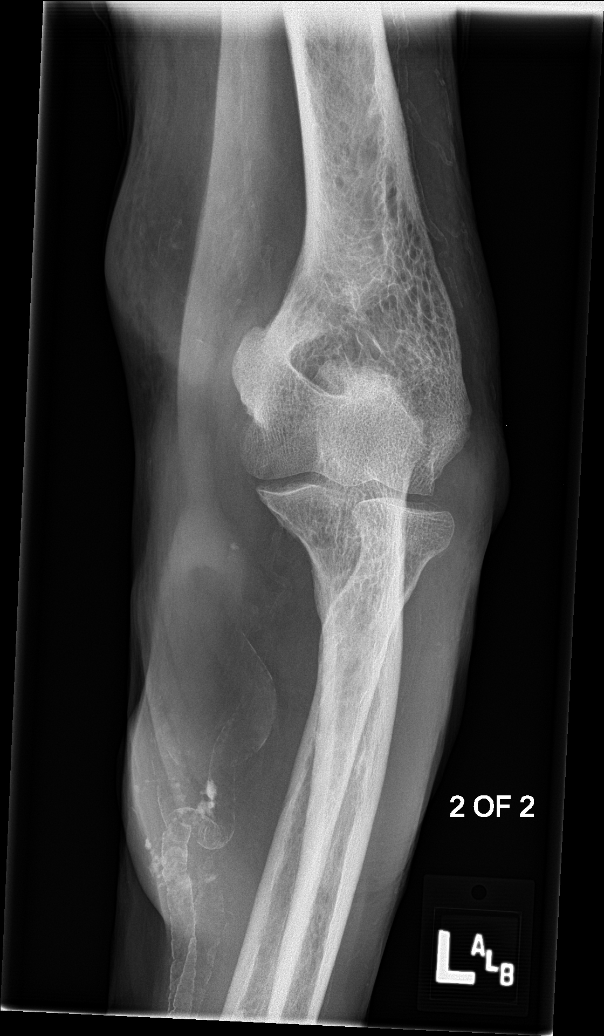
[im 2/2]
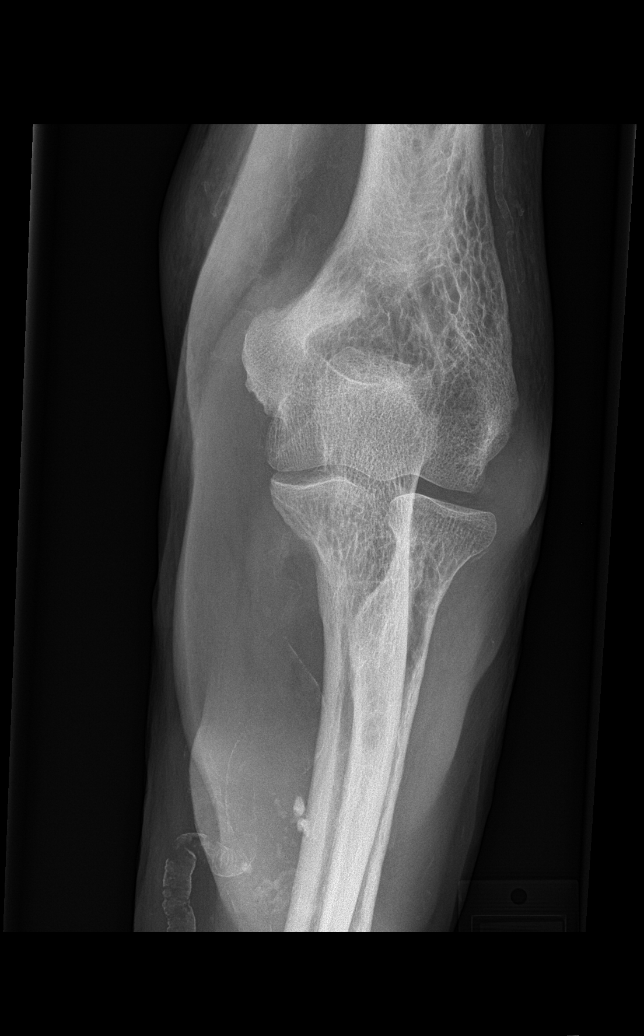

[elbow obl (2 of 2)]
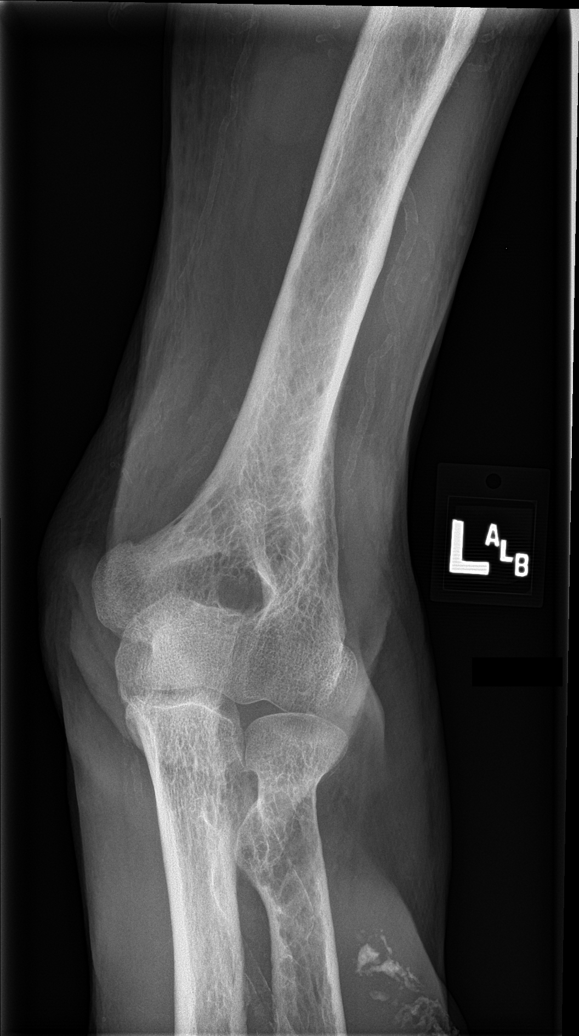

[elbow lat (1 of 2)]
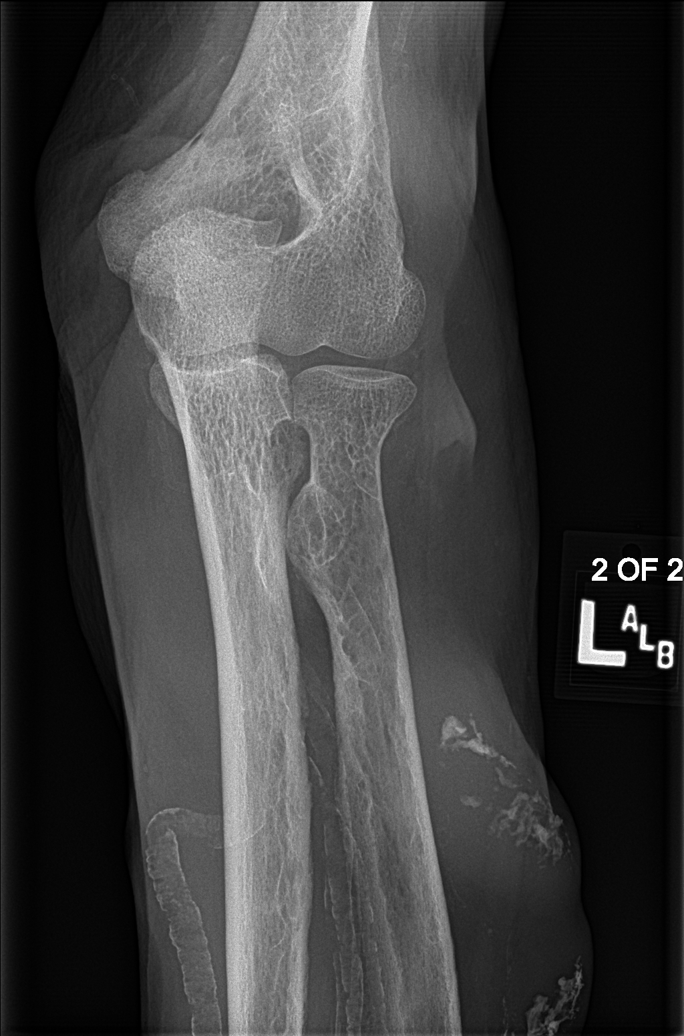

[Series 5: elbow lat · 0.14mm/px · 2 of 2 slices shown (2 of 2)]
[im 1/2]
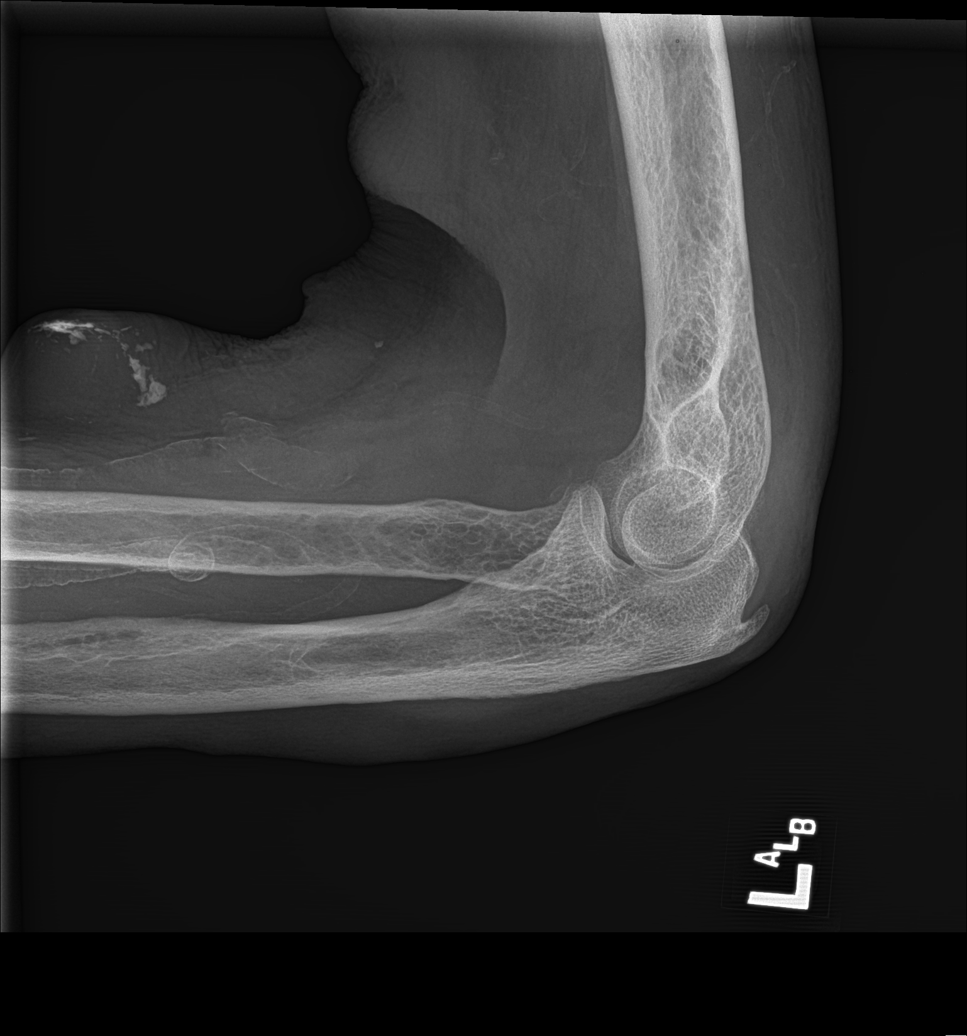
[im 2/2]
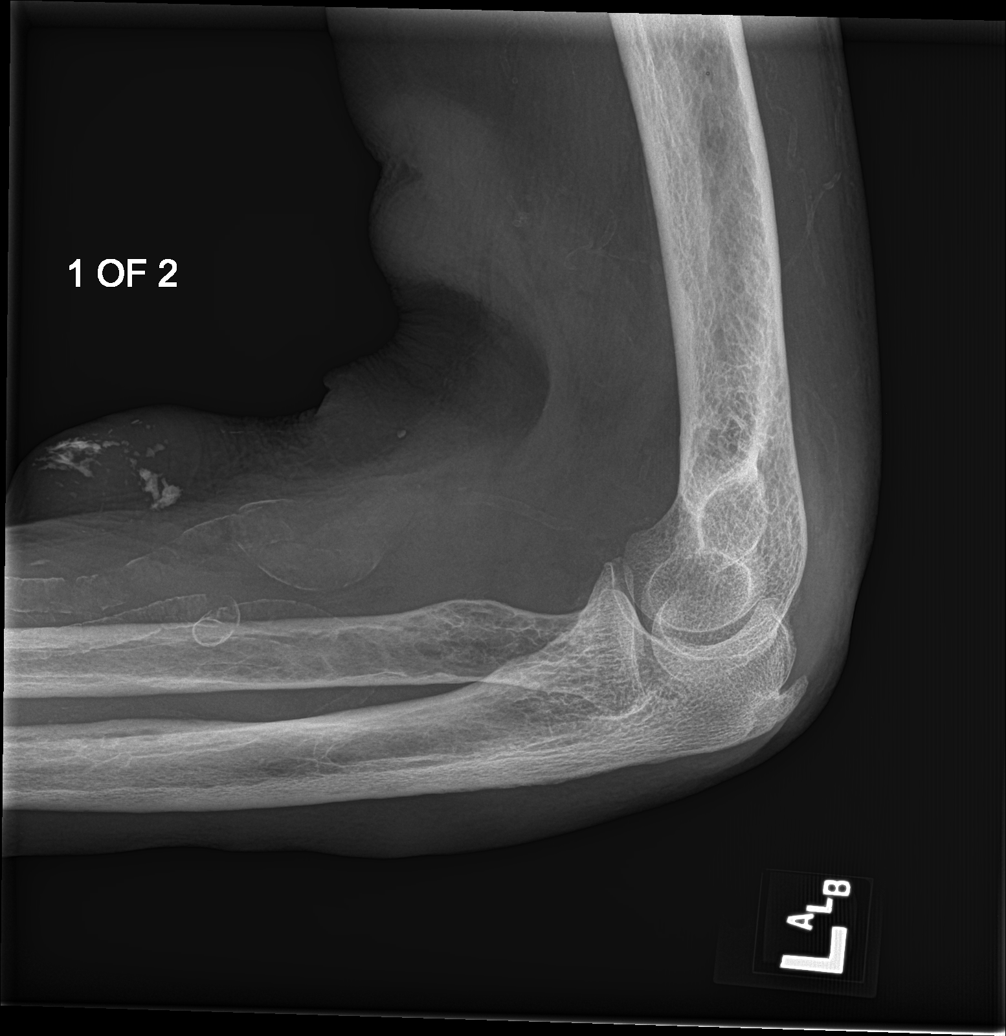

[7 of 7 positions shown; findings below may reference images not displayed]

FINDINGS: Normal alignment. No fracture. Calcaneal spurring. Joint space
normal.

Calcified hematoma in the forearm, chronic. Arterial calcification
and fistula calcification.
IMPRESSION: Negative for fracture.

## 2017-12-13 DIAGNOSIS — M48062 Spinal stenosis, lumbar region with neurogenic claudication: Secondary | ICD-10-CM | POA: Diagnosis not present

## 2017-12-13 DIAGNOSIS — M4712 Other spondylosis with myelopathy, cervical region: Secondary | ICD-10-CM | POA: Diagnosis not present

## 2017-12-13 DIAGNOSIS — N186 End stage renal disease: Secondary | ICD-10-CM | POA: Diagnosis not present

## 2017-12-13 DIAGNOSIS — M5030 Other cervical disc degeneration, unspecified cervical region: Secondary | ICD-10-CM | POA: Diagnosis not present

## 2017-12-13 DIAGNOSIS — G8929 Other chronic pain: Secondary | ICD-10-CM | POA: Diagnosis not present

## 2017-12-13 DIAGNOSIS — I120 Hypertensive chronic kidney disease with stage 5 chronic kidney disease or end stage renal disease: Secondary | ICD-10-CM | POA: Diagnosis not present

## 2017-12-17 DIAGNOSIS — Z940 Kidney transplant status: Secondary | ICD-10-CM | POA: Diagnosis not present

## 2017-12-17 DIAGNOSIS — H269 Unspecified cataract: Secondary | ICD-10-CM | POA: Diagnosis not present

## 2017-12-17 DIAGNOSIS — Z992 Dependence on renal dialysis: Secondary | ICD-10-CM | POA: Diagnosis not present

## 2017-12-17 DIAGNOSIS — M4712 Other spondylosis with myelopathy, cervical region: Secondary | ICD-10-CM | POA: Diagnosis not present

## 2017-12-17 DIAGNOSIS — N186 End stage renal disease: Secondary | ICD-10-CM | POA: Diagnosis not present

## 2017-12-17 DIAGNOSIS — M5030 Other cervical disc degeneration, unspecified cervical region: Secondary | ICD-10-CM | POA: Diagnosis not present

## 2017-12-17 DIAGNOSIS — G629 Polyneuropathy, unspecified: Secondary | ICD-10-CM | POA: Diagnosis not present

## 2017-12-17 DIAGNOSIS — M48062 Spinal stenosis, lumbar region with neurogenic claudication: Secondary | ICD-10-CM | POA: Diagnosis not present

## 2017-12-17 DIAGNOSIS — N250 Renal osteodystrophy: Secondary | ICD-10-CM | POA: Diagnosis not present

## 2017-12-17 DIAGNOSIS — G4733 Obstructive sleep apnea (adult) (pediatric): Secondary | ICD-10-CM | POA: Diagnosis not present

## 2017-12-17 DIAGNOSIS — F4325 Adjustment disorder with mixed disturbance of emotions and conduct: Secondary | ICD-10-CM | POA: Diagnosis not present

## 2017-12-17 DIAGNOSIS — F329 Major depressive disorder, single episode, unspecified: Secondary | ICD-10-CM | POA: Diagnosis not present

## 2017-12-17 DIAGNOSIS — D509 Iron deficiency anemia, unspecified: Secondary | ICD-10-CM | POA: Diagnosis not present

## 2017-12-17 DIAGNOSIS — Z8673 Personal history of transient ischemic attack (TIA), and cerebral infarction without residual deficits: Secondary | ICD-10-CM | POA: Diagnosis not present

## 2017-12-17 DIAGNOSIS — E211 Secondary hyperparathyroidism, not elsewhere classified: Secondary | ICD-10-CM | POA: Diagnosis not present

## 2017-12-17 DIAGNOSIS — I120 Hypertensive chronic kidney disease with stage 5 chronic kidney disease or end stage renal disease: Secondary | ICD-10-CM | POA: Diagnosis not present

## 2017-12-17 DIAGNOSIS — G8929 Other chronic pain: Secondary | ICD-10-CM | POA: Diagnosis not present

## 2017-12-22 DIAGNOSIS — G8929 Other chronic pain: Secondary | ICD-10-CM | POA: Diagnosis not present

## 2017-12-22 DIAGNOSIS — M48062 Spinal stenosis, lumbar region with neurogenic claudication: Secondary | ICD-10-CM | POA: Diagnosis not present

## 2017-12-22 DIAGNOSIS — N186 End stage renal disease: Secondary | ICD-10-CM | POA: Diagnosis not present

## 2017-12-22 DIAGNOSIS — M4712 Other spondylosis with myelopathy, cervical region: Secondary | ICD-10-CM | POA: Diagnosis not present

## 2017-12-22 DIAGNOSIS — I120 Hypertensive chronic kidney disease with stage 5 chronic kidney disease or end stage renal disease: Secondary | ICD-10-CM | POA: Diagnosis not present

## 2017-12-22 DIAGNOSIS — M5030 Other cervical disc degeneration, unspecified cervical region: Secondary | ICD-10-CM | POA: Diagnosis not present

## 2017-12-29 DIAGNOSIS — M48062 Spinal stenosis, lumbar region with neurogenic claudication: Secondary | ICD-10-CM | POA: Diagnosis not present

## 2017-12-29 DIAGNOSIS — G8929 Other chronic pain: Secondary | ICD-10-CM | POA: Diagnosis not present

## 2017-12-29 DIAGNOSIS — M5030 Other cervical disc degeneration, unspecified cervical region: Secondary | ICD-10-CM | POA: Diagnosis not present

## 2017-12-29 DIAGNOSIS — N186 End stage renal disease: Secondary | ICD-10-CM | POA: Diagnosis not present

## 2017-12-29 DIAGNOSIS — I120 Hypertensive chronic kidney disease with stage 5 chronic kidney disease or end stage renal disease: Secondary | ICD-10-CM | POA: Diagnosis not present

## 2017-12-29 DIAGNOSIS — M4712 Other spondylosis with myelopathy, cervical region: Secondary | ICD-10-CM | POA: Diagnosis not present

## 2018-01-04 DIAGNOSIS — M48062 Spinal stenosis, lumbar region with neurogenic claudication: Secondary | ICD-10-CM | POA: Diagnosis not present

## 2018-01-04 DIAGNOSIS — M4712 Other spondylosis with myelopathy, cervical region: Secondary | ICD-10-CM | POA: Diagnosis not present

## 2018-01-04 DIAGNOSIS — N186 End stage renal disease: Secondary | ICD-10-CM | POA: Diagnosis not present

## 2018-01-04 DIAGNOSIS — G8929 Other chronic pain: Secondary | ICD-10-CM | POA: Diagnosis not present

## 2018-01-04 DIAGNOSIS — I120 Hypertensive chronic kidney disease with stage 5 chronic kidney disease or end stage renal disease: Secondary | ICD-10-CM | POA: Diagnosis not present

## 2018-01-04 DIAGNOSIS — M5030 Other cervical disc degeneration, unspecified cervical region: Secondary | ICD-10-CM | POA: Diagnosis not present

## 2018-01-05 DIAGNOSIS — M5030 Other cervical disc degeneration, unspecified cervical region: Secondary | ICD-10-CM | POA: Diagnosis not present

## 2018-01-05 DIAGNOSIS — G8929 Other chronic pain: Secondary | ICD-10-CM | POA: Diagnosis not present

## 2018-01-05 DIAGNOSIS — M4712 Other spondylosis with myelopathy, cervical region: Secondary | ICD-10-CM | POA: Diagnosis not present

## 2018-01-05 DIAGNOSIS — N186 End stage renal disease: Secondary | ICD-10-CM | POA: Diagnosis not present

## 2018-01-05 DIAGNOSIS — I120 Hypertensive chronic kidney disease with stage 5 chronic kidney disease or end stage renal disease: Secondary | ICD-10-CM | POA: Diagnosis not present

## 2018-01-05 DIAGNOSIS — M48062 Spinal stenosis, lumbar region with neurogenic claudication: Secondary | ICD-10-CM | POA: Diagnosis not present

## 2018-01-10 DIAGNOSIS — M48062 Spinal stenosis, lumbar region with neurogenic claudication: Secondary | ICD-10-CM | POA: Diagnosis not present

## 2018-01-10 DIAGNOSIS — I120 Hypertensive chronic kidney disease with stage 5 chronic kidney disease or end stage renal disease: Secondary | ICD-10-CM | POA: Diagnosis not present

## 2018-01-10 DIAGNOSIS — G8929 Other chronic pain: Secondary | ICD-10-CM | POA: Diagnosis not present

## 2018-01-10 DIAGNOSIS — N186 End stage renal disease: Secondary | ICD-10-CM | POA: Diagnosis not present

## 2018-01-10 DIAGNOSIS — M5030 Other cervical disc degeneration, unspecified cervical region: Secondary | ICD-10-CM | POA: Diagnosis not present

## 2018-01-10 DIAGNOSIS — M4712 Other spondylosis with myelopathy, cervical region: Secondary | ICD-10-CM | POA: Diagnosis not present

## 2018-01-12 DIAGNOSIS — M48062 Spinal stenosis, lumbar region with neurogenic claudication: Secondary | ICD-10-CM | POA: Diagnosis not present

## 2018-01-12 DIAGNOSIS — M4712 Other spondylosis with myelopathy, cervical region: Secondary | ICD-10-CM | POA: Diagnosis not present

## 2018-01-12 DIAGNOSIS — G8929 Other chronic pain: Secondary | ICD-10-CM | POA: Diagnosis not present

## 2018-01-12 DIAGNOSIS — M5030 Other cervical disc degeneration, unspecified cervical region: Secondary | ICD-10-CM | POA: Diagnosis not present

## 2018-01-12 DIAGNOSIS — I120 Hypertensive chronic kidney disease with stage 5 chronic kidney disease or end stage renal disease: Secondary | ICD-10-CM | POA: Diagnosis not present

## 2018-01-12 DIAGNOSIS — N186 End stage renal disease: Secondary | ICD-10-CM | POA: Diagnosis not present

## 2018-01-24 DIAGNOSIS — N186 End stage renal disease: Secondary | ICD-10-CM | POA: Diagnosis not present

## 2018-01-24 DIAGNOSIS — M4712 Other spondylosis with myelopathy, cervical region: Secondary | ICD-10-CM | POA: Diagnosis not present

## 2018-01-24 DIAGNOSIS — G8929 Other chronic pain: Secondary | ICD-10-CM | POA: Diagnosis not present

## 2018-01-24 DIAGNOSIS — I120 Hypertensive chronic kidney disease with stage 5 chronic kidney disease or end stage renal disease: Secondary | ICD-10-CM | POA: Diagnosis not present

## 2018-01-24 DIAGNOSIS — M5030 Other cervical disc degeneration, unspecified cervical region: Secondary | ICD-10-CM | POA: Diagnosis not present

## 2018-01-24 DIAGNOSIS — M48062 Spinal stenosis, lumbar region with neurogenic claudication: Secondary | ICD-10-CM | POA: Diagnosis not present

## 2018-01-26 DIAGNOSIS — M5030 Other cervical disc degeneration, unspecified cervical region: Secondary | ICD-10-CM | POA: Diagnosis not present

## 2018-01-26 DIAGNOSIS — I120 Hypertensive chronic kidney disease with stage 5 chronic kidney disease or end stage renal disease: Secondary | ICD-10-CM | POA: Diagnosis not present

## 2018-01-26 DIAGNOSIS — G8929 Other chronic pain: Secondary | ICD-10-CM | POA: Diagnosis not present

## 2018-01-26 DIAGNOSIS — M48062 Spinal stenosis, lumbar region with neurogenic claudication: Secondary | ICD-10-CM | POA: Diagnosis not present

## 2018-01-26 DIAGNOSIS — M4712 Other spondylosis with myelopathy, cervical region: Secondary | ICD-10-CM | POA: Diagnosis not present

## 2018-01-26 DIAGNOSIS — N186 End stage renal disease: Secondary | ICD-10-CM | POA: Diagnosis not present

## 2018-01-31 DIAGNOSIS — M4712 Other spondylosis with myelopathy, cervical region: Secondary | ICD-10-CM | POA: Diagnosis not present

## 2018-01-31 DIAGNOSIS — I120 Hypertensive chronic kidney disease with stage 5 chronic kidney disease or end stage renal disease: Secondary | ICD-10-CM | POA: Diagnosis not present

## 2018-01-31 DIAGNOSIS — N186 End stage renal disease: Secondary | ICD-10-CM | POA: Diagnosis not present

## 2018-01-31 DIAGNOSIS — G8929 Other chronic pain: Secondary | ICD-10-CM | POA: Diagnosis not present

## 2018-01-31 DIAGNOSIS — M48062 Spinal stenosis, lumbar region with neurogenic claudication: Secondary | ICD-10-CM | POA: Diagnosis not present

## 2018-01-31 DIAGNOSIS — M5030 Other cervical disc degeneration, unspecified cervical region: Secondary | ICD-10-CM | POA: Diagnosis not present

## 2018-02-02 DIAGNOSIS — M5030 Other cervical disc degeneration, unspecified cervical region: Secondary | ICD-10-CM | POA: Diagnosis not present

## 2018-02-02 DIAGNOSIS — M48062 Spinal stenosis, lumbar region with neurogenic claudication: Secondary | ICD-10-CM | POA: Diagnosis not present

## 2018-02-02 DIAGNOSIS — G8929 Other chronic pain: Secondary | ICD-10-CM | POA: Diagnosis not present

## 2018-02-02 DIAGNOSIS — I120 Hypertensive chronic kidney disease with stage 5 chronic kidney disease or end stage renal disease: Secondary | ICD-10-CM | POA: Diagnosis not present

## 2018-02-02 DIAGNOSIS — N186 End stage renal disease: Secondary | ICD-10-CM | POA: Diagnosis not present

## 2018-02-02 DIAGNOSIS — M4712 Other spondylosis with myelopathy, cervical region: Secondary | ICD-10-CM | POA: Diagnosis not present

## 2018-02-09 DIAGNOSIS — M5030 Other cervical disc degeneration, unspecified cervical region: Secondary | ICD-10-CM | POA: Diagnosis not present

## 2018-02-09 DIAGNOSIS — N186 End stage renal disease: Secondary | ICD-10-CM | POA: Diagnosis not present

## 2018-02-09 DIAGNOSIS — M48062 Spinal stenosis, lumbar region with neurogenic claudication: Secondary | ICD-10-CM | POA: Diagnosis not present

## 2018-02-09 DIAGNOSIS — I120 Hypertensive chronic kidney disease with stage 5 chronic kidney disease or end stage renal disease: Secondary | ICD-10-CM | POA: Diagnosis not present

## 2018-02-09 DIAGNOSIS — G8929 Other chronic pain: Secondary | ICD-10-CM | POA: Diagnosis not present

## 2018-02-09 DIAGNOSIS — M4712 Other spondylosis with myelopathy, cervical region: Secondary | ICD-10-CM | POA: Diagnosis not present

## 2018-02-14 DIAGNOSIS — M48062 Spinal stenosis, lumbar region with neurogenic claudication: Secondary | ICD-10-CM | POA: Diagnosis not present

## 2018-02-14 DIAGNOSIS — I120 Hypertensive chronic kidney disease with stage 5 chronic kidney disease or end stage renal disease: Secondary | ICD-10-CM | POA: Diagnosis not present

## 2018-02-14 DIAGNOSIS — G8929 Other chronic pain: Secondary | ICD-10-CM | POA: Diagnosis not present

## 2018-02-14 DIAGNOSIS — M5030 Other cervical disc degeneration, unspecified cervical region: Secondary | ICD-10-CM | POA: Diagnosis not present

## 2018-02-14 DIAGNOSIS — N186 End stage renal disease: Secondary | ICD-10-CM | POA: Diagnosis not present

## 2018-02-14 DIAGNOSIS — M4712 Other spondylosis with myelopathy, cervical region: Secondary | ICD-10-CM | POA: Diagnosis not present

## 2018-07-20 DIAGNOSIS — R0602 Shortness of breath: Secondary | ICD-10-CM | POA: Diagnosis not present

## 2018-07-20 DIAGNOSIS — R0902 Hypoxemia: Secondary | ICD-10-CM | POA: Diagnosis not present

## 2018-07-20 DIAGNOSIS — R42 Dizziness and giddiness: Secondary | ICD-10-CM | POA: Diagnosis not present

## 2018-07-20 DIAGNOSIS — I959 Hypotension, unspecified: Secondary | ICD-10-CM | POA: Diagnosis not present

## 2018-07-20 DIAGNOSIS — R069 Unspecified abnormalities of breathing: Secondary | ICD-10-CM | POA: Diagnosis not present

## 2018-10-30 ENCOUNTER — Inpatient Hospital Stay (HOSPITAL_COMMUNITY)
Admit: 2018-10-30 | Discharge: 2018-10-30 | Disposition: A | Discharge status: Home / Self Care | Payer: Medicare Other | Attending: Internal Medicine | Admitting: Internal Medicine

## 2018-10-30 ENCOUNTER — Other Ambulatory Visit: Payer: Self-pay

## 2018-10-30 ENCOUNTER — Emergency Department: Payer: Medicare Other

## 2018-10-30 ENCOUNTER — Inpatient Hospital Stay
Admission: EM | Admit: 2018-10-30 | Discharge: 2018-11-07 | DRG: 246 | Disposition: A | Discharge status: Home Health | Payer: Medicare Other | Attending: Internal Medicine | Admitting: Internal Medicine

## 2018-10-30 ENCOUNTER — Encounter
Admission: EM | Disposition: A | Discharge status: Home Health | Payer: Self-pay | Source: Home / Self Care | Attending: Internal Medicine

## 2018-10-30 ENCOUNTER — Encounter: Payer: Self-pay | Admitting: Internal Medicine

## 2018-10-30 DIAGNOSIS — Z888 Allergy status to other drugs, medicaments and biological substances status: Secondary | ICD-10-CM | POA: Diagnosis not present

## 2018-10-30 DIAGNOSIS — I953 Hypotension of hemodialysis: Secondary | ICD-10-CM | POA: Diagnosis not present

## 2018-10-30 DIAGNOSIS — Z820 Family history of epilepsy and other diseases of the nervous system: Secondary | ICD-10-CM | POA: Diagnosis not present

## 2018-10-30 DIAGNOSIS — Z91041 Radiographic dye allergy status: Secondary | ICD-10-CM

## 2018-10-30 DIAGNOSIS — R0602 Shortness of breath: Secondary | ICD-10-CM | POA: Diagnosis not present

## 2018-10-30 DIAGNOSIS — K921 Melena: Secondary | ICD-10-CM | POA: Diagnosis not present

## 2018-10-30 DIAGNOSIS — R079 Chest pain, unspecified: Secondary | ICD-10-CM | POA: Diagnosis not present

## 2018-10-30 DIAGNOSIS — Z809 Family history of malignant neoplasm, unspecified: Secondary | ICD-10-CM

## 2018-10-30 DIAGNOSIS — Z825 Family history of asthma and other chronic lower respiratory diseases: Secondary | ICD-10-CM | POA: Diagnosis not present

## 2018-10-30 DIAGNOSIS — Z882 Allergy status to sulfonamides status: Secondary | ICD-10-CM | POA: Diagnosis not present

## 2018-10-30 DIAGNOSIS — K922 Gastrointestinal hemorrhage, unspecified: Secondary | ICD-10-CM | POA: Diagnosis not present

## 2018-10-30 DIAGNOSIS — D631 Anemia in chronic kidney disease: Secondary | ICD-10-CM | POA: Diagnosis present

## 2018-10-30 DIAGNOSIS — R509 Fever, unspecified: Secondary | ICD-10-CM | POA: Diagnosis not present

## 2018-10-30 DIAGNOSIS — Z992 Dependence on renal dialysis: Secondary | ICD-10-CM

## 2018-10-30 DIAGNOSIS — N186 End stage renal disease: Secondary | ICD-10-CM

## 2018-10-30 DIAGNOSIS — H53489 Generalized contraction of visual field, unspecified eye: Secondary | ICD-10-CM | POA: Diagnosis present

## 2018-10-30 DIAGNOSIS — I161 Hypertensive emergency: Secondary | ICD-10-CM | POA: Diagnosis present

## 2018-10-30 DIAGNOSIS — I10 Essential (primary) hypertension: Secondary | ICD-10-CM | POA: Diagnosis not present

## 2018-10-30 DIAGNOSIS — F419 Anxiety disorder, unspecified: Secondary | ICD-10-CM | POA: Diagnosis not present

## 2018-10-30 DIAGNOSIS — I132 Hypertensive heart and chronic kidney disease with heart failure and with stage 5 chronic kidney disease, or end stage renal disease: Secondary | ICD-10-CM | POA: Diagnosis present

## 2018-10-30 DIAGNOSIS — I2510 Atherosclerotic heart disease of native coronary artery without angina pectoris: Secondary | ICD-10-CM | POA: Diagnosis not present

## 2018-10-30 DIAGNOSIS — Z20828 Contact with and (suspected) exposure to other viral communicable diseases: Secondary | ICD-10-CM | POA: Diagnosis present

## 2018-10-30 DIAGNOSIS — I4891 Unspecified atrial fibrillation: Secondary | ICD-10-CM | POA: Diagnosis not present

## 2018-10-30 DIAGNOSIS — Z8249 Family history of ischemic heart disease and other diseases of the circulatory system: Secondary | ICD-10-CM | POA: Diagnosis not present

## 2018-10-30 DIAGNOSIS — Z79899 Other long term (current) drug therapy: Secondary | ICD-10-CM

## 2018-10-30 DIAGNOSIS — J189 Pneumonia, unspecified organism: Secondary | ICD-10-CM | POA: Diagnosis not present

## 2018-10-30 DIAGNOSIS — I255 Ischemic cardiomyopathy: Secondary | ICD-10-CM

## 2018-10-30 DIAGNOSIS — I480 Paroxysmal atrial fibrillation: Secondary | ICD-10-CM | POA: Diagnosis present

## 2018-10-30 DIAGNOSIS — R0789 Other chest pain: Secondary | ICD-10-CM | POA: Diagnosis not present

## 2018-10-30 DIAGNOSIS — E785 Hyperlipidemia, unspecified: Secondary | ICD-10-CM | POA: Diagnosis not present

## 2018-10-30 DIAGNOSIS — A419 Sepsis, unspecified organism: Secondary | ICD-10-CM | POA: Diagnosis not present

## 2018-10-30 DIAGNOSIS — K5730 Diverticulosis of large intestine without perforation or abscess without bleeding: Secondary | ICD-10-CM | POA: Diagnosis not present

## 2018-10-30 DIAGNOSIS — I5020 Unspecified systolic (congestive) heart failure: Secondary | ICD-10-CM | POA: Diagnosis present

## 2018-10-30 DIAGNOSIS — R58 Hemorrhage, not elsewhere classified: Secondary | ICD-10-CM | POA: Diagnosis not present

## 2018-10-30 DIAGNOSIS — Z981 Arthrodesis status: Secondary | ICD-10-CM | POA: Diagnosis not present

## 2018-10-30 DIAGNOSIS — Z79891 Long term (current) use of opiate analgesic: Secondary | ICD-10-CM

## 2018-10-30 DIAGNOSIS — I213 ST elevation (STEMI) myocardial infarction of unspecified site: Secondary | ICD-10-CM

## 2018-10-30 DIAGNOSIS — E875 Hyperkalemia: Secondary | ICD-10-CM | POA: Diagnosis not present

## 2018-10-30 DIAGNOSIS — Z91048 Other nonmedicinal substance allergy status: Secondary | ICD-10-CM | POA: Diagnosis not present

## 2018-10-30 DIAGNOSIS — I25110 Atherosclerotic heart disease of native coronary artery with unstable angina pectoris: Secondary | ICD-10-CM | POA: Diagnosis present

## 2018-10-30 DIAGNOSIS — I959 Hypotension, unspecified: Secondary | ICD-10-CM

## 2018-10-30 DIAGNOSIS — I2109 ST elevation (STEMI) myocardial infarction involving other coronary artery of anterior wall: Secondary | ICD-10-CM | POA: Diagnosis not present

## 2018-10-30 DIAGNOSIS — Z880 Allergy status to penicillin: Secondary | ICD-10-CM

## 2018-10-30 DIAGNOSIS — G894 Chronic pain syndrome: Secondary | ICD-10-CM | POA: Diagnosis present

## 2018-10-30 DIAGNOSIS — Z905 Acquired absence of kidney: Secondary | ICD-10-CM

## 2018-10-30 DIAGNOSIS — I472 Ventricular tachycardia: Secondary | ICD-10-CM | POA: Diagnosis not present

## 2018-10-30 DIAGNOSIS — Z85528 Personal history of other malignant neoplasm of kidney: Secondary | ICD-10-CM

## 2018-10-30 DIAGNOSIS — I493 Ventricular premature depolarization: Secondary | ICD-10-CM | POA: Diagnosis present

## 2018-10-30 DIAGNOSIS — N2581 Secondary hyperparathyroidism of renal origin: Secondary | ICD-10-CM | POA: Diagnosis not present

## 2018-10-30 DIAGNOSIS — G629 Polyneuropathy, unspecified: Secondary | ICD-10-CM | POA: Diagnosis present

## 2018-10-30 DIAGNOSIS — I2102 ST elevation (STEMI) myocardial infarction involving left anterior descending coronary artery: Secondary | ICD-10-CM | POA: Diagnosis not present

## 2018-10-30 DIAGNOSIS — D539 Nutritional anemia, unspecified: Secondary | ICD-10-CM | POA: Diagnosis present

## 2018-10-30 DIAGNOSIS — Z79890 Hormone replacement therapy (postmenopausal): Secondary | ICD-10-CM

## 2018-10-30 HISTORY — PX: LEFT HEART CATH AND CORONARY ANGIOGRAPHY: CATH118249

## 2018-10-30 HISTORY — PX: CORONARY/GRAFT ACUTE MI REVASCULARIZATION: CATH118305

## 2018-10-30 LAB — CBC WITH DIFFERENTIAL/PLATELET
Abs Immature Granulocytes: 0.03 10*3/uL (ref 0.00–0.07)
Basophils Absolute: 0.1 10*3/uL (ref 0.0–0.1)
Basophils Relative: 1 %
Eosinophils Absolute: 0.2 10*3/uL (ref 0.0–0.5)
Eosinophils Relative: 2 %
HCT: 42.3 % (ref 39.0–52.0)
Hemoglobin: 13.5 g/dL (ref 13.0–17.0)
Immature Granulocytes: 0 %
Lymphocytes Relative: 27 %
Lymphs Abs: 3.4 10*3/uL (ref 0.7–4.0)
MCH: 34.3 pg — ABNORMAL HIGH (ref 26.0–34.0)
MCHC: 31.9 g/dL (ref 30.0–36.0)
MCV: 107.4 fL — ABNORMAL HIGH (ref 80.0–100.0)
Monocytes Absolute: 1.2 10*3/uL — ABNORMAL HIGH (ref 0.1–1.0)
Monocytes Relative: 9 %
Neutro Abs: 7.8 10*3/uL — ABNORMAL HIGH (ref 1.7–7.7)
Neutrophils Relative %: 61 %
Platelets: 269 10*3/uL (ref 150–400)
RBC: 3.94 MIL/uL — ABNORMAL LOW (ref 4.22–5.81)
RDW: 14 % (ref 11.5–15.5)
WBC: 12.7 10*3/uL — ABNORMAL HIGH (ref 4.0–10.5)
nRBC: 0 % (ref 0.0–0.2)

## 2018-10-30 LAB — CBC
HCT: 40.1 % (ref 39.0–52.0)
Hemoglobin: 12.8 g/dL — ABNORMAL LOW (ref 13.0–17.0)
MCH: 34 pg (ref 26.0–34.0)
MCHC: 31.9 g/dL (ref 30.0–36.0)
MCV: 106.4 fL — ABNORMAL HIGH (ref 80.0–100.0)
Platelets: 259 10*3/uL (ref 150–400)
RBC: 3.77 MIL/uL — ABNORMAL LOW (ref 4.22–5.81)
RDW: 13.9 % (ref 11.5–15.5)
WBC: 14.2 10*3/uL — ABNORMAL HIGH (ref 4.0–10.5)
nRBC: 0 % (ref 0.0–0.2)

## 2018-10-30 LAB — POCT ACTIVATED CLOTTING TIME
Activated Clotting Time: 180 seconds
Activated Clotting Time: 230 seconds
Activated Clotting Time: 241 seconds
Activated Clotting Time: 158 seconds

## 2018-10-30 LAB — LIPASE, BLOOD: Lipase: 53 U/L — ABNORMAL HIGH (ref 11–51)

## 2018-10-30 LAB — COMPREHENSIVE METABOLIC PANEL
ALT: 18 U/L (ref 0–44)
AST: 26 U/L (ref 15–41)
Albumin: 3.8 g/dL (ref 3.5–5.0)
Alkaline Phosphatase: 212 U/L — ABNORMAL HIGH (ref 38–126)
Anion gap: 16 — ABNORMAL HIGH (ref 5–15)
BUN: 60 mg/dL — ABNORMAL HIGH (ref 8–23)
CO2: 25 mmol/L (ref 22–32)
Calcium: 8.6 mg/dL — ABNORMAL LOW (ref 8.9–10.3)
Chloride: 94 mmol/L — ABNORMAL LOW (ref 98–111)
Creatinine, Ser: 7.25 mg/dL — ABNORMAL HIGH (ref 0.61–1.24)
GFR calc Af Amer: 8 mL/min — ABNORMAL LOW (ref >60)
GFR calc non Af Amer: 7 mL/min — ABNORMAL LOW (ref >60)
Glucose, Bld: 104 mg/dL — ABNORMAL HIGH (ref 70–99)
Potassium: 4.9 mmol/L (ref 3.5–5.1)
Sodium: 135 mmol/L (ref 135–145)
Total Bilirubin: 1.7 mg/dL — ABNORMAL HIGH (ref 0.3–1.2)
Total Protein: 9.3 g/dL — ABNORMAL HIGH (ref 6.5–8.1)

## 2018-10-30 LAB — TROPONIN I (HIGH SENSITIVITY)
Troponin I (High Sensitivity): 132 ng/L (ref <18)
Troponin I (High Sensitivity): 129 ng/L (ref <18)
Troponin I (High Sensitivity): 161 ng/L (ref <18)
Troponin I (High Sensitivity): 318 ng/L (ref <18)

## 2018-10-30 LAB — APTT
aPTT: 34 seconds (ref 24–36)
aPTT: >160 seconds (ref 24–36)

## 2018-10-30 LAB — GLUCOSE, CAPILLARY: Glucose-Capillary: 67 mg/dL — ABNORMAL LOW (ref 70–99)

## 2018-10-30 LAB — BASIC METABOLIC PANEL
Anion gap: 15 (ref 5–15)
BUN: 63 mg/dL — ABNORMAL HIGH (ref 8–23)
CO2: 24 mmol/L (ref 22–32)
Calcium: 8.1 mg/dL — ABNORMAL LOW (ref 8.9–10.3)
Chloride: 97 mmol/L — ABNORMAL LOW (ref 98–111)
Creatinine, Ser: 7.35 mg/dL — ABNORMAL HIGH (ref 0.61–1.24)
GFR calc Af Amer: 8 mL/min — ABNORMAL LOW (ref >60)
GFR calc non Af Amer: 7 mL/min — ABNORMAL LOW (ref >60)
Glucose, Bld: 88 mg/dL (ref 70–99)
Potassium: 4.8 mmol/L (ref 3.5–5.1)
Sodium: 136 mmol/L (ref 135–145)

## 2018-10-30 LAB — LIPID PANEL
Cholesterol: 207 mg/dL — ABNORMAL HIGH (ref 0–200)
HDL: 40 mg/dL — ABNORMAL LOW (ref >40)
LDL Cholesterol: 121 mg/dL — ABNORMAL HIGH (ref 0–99)
Total CHOL/HDL Ratio: 5.2 RATIO
Triglycerides: 229 mg/dL — ABNORMAL HIGH (ref <150)
VLDL: 46 mg/dL — ABNORMAL HIGH (ref 0–40)

## 2018-10-30 LAB — PHOSPHORUS: Phosphorus: 2.5 mg/dL (ref 2.5–4.6)

## 2018-10-30 LAB — PROTIME-INR
INR: 1.1 (ref 0.8–1.2)
Prothrombin Time: 13.6 seconds (ref 11.4–15.2)

## 2018-10-30 LAB — SARS CORONAVIRUS 2 BY RT PCR (HOSPITAL ORDER, PERFORMED IN ~~LOC~~ HOSPITAL LAB): SARS Coronavirus 2: NEGATIVE

## 2018-10-30 LAB — ECHOCARDIOGRAM COMPLETE
Height: 69 in
Weight: 3104.08 oz

## 2018-10-30 LAB — LACTIC ACID, PLASMA: Lactic Acid, Venous: 1.2 mmol/L (ref 0.5–1.9)

## 2018-10-30 LAB — MRSA PCR SCREENING: MRSA by PCR: NEGATIVE

## 2018-10-30 LAB — PROCALCITONIN
Procalcitonin: 0.9 ng/mL
Procalcitonin: 1.73 ng/mL

## 2018-10-30 LAB — HEPATITIS B SURFACE ANTIGEN: Hepatitis B Surface Ag: NONREACTIVE

## 2018-10-30 SURGERY — CORONARY/GRAFT ACUTE MI REVASCULARIZATION
Anesthesia: Moderate Sedation

## 2018-10-30 MED ORDER — NITROGLYCERIN 0.4 MG SL SUBL: 0.4000 mg | SUBLINGUAL_TABLET | SUBLINGUAL | Status: DC | PRN

## 2018-10-30 MED ORDER — TICAGRELOR 90 MG PO TABS
ORAL_TABLET | ORAL | Status: AC
  Filled 2018-10-30: qty 2

## 2018-10-30 MED ORDER — METHYLPREDNISOLONE SODIUM SUCC 125 MG IJ SOLR
125.0000 mg | Freq: Once | INTRAMUSCULAR | Status: AC
  Administered 2018-10-31: 125 mg via INTRAVENOUS
  Filled 2018-10-30: qty 2

## 2018-10-30 MED ORDER — METHYLPREDNISOLONE SODIUM SUCC 125 MG IJ SOLR
INTRAMUSCULAR | Status: AC
  Administered 2018-10-30: 03:00:00
  Filled 2018-10-30: qty 2

## 2018-10-30 MED ORDER — LORATADINE 10 MG PO TABS
10.0000 mg | ORAL_TABLET | Freq: Every day | ORAL | Status: DC
  Administered 2018-10-30 – 2018-11-07 (×9): 10 mg via ORAL
  Filled 2018-10-30 (×9): qty 1

## 2018-10-30 MED ORDER — METHOCARBAMOL 500 MG PO TABS
500.0000 mg | ORAL_TABLET | Freq: Two times a day (BID) | ORAL | Status: DC | PRN
  Filled 2018-10-30: qty 1

## 2018-10-30 MED ORDER — PANTOPRAZOLE SODIUM 40 MG PO TBEC
40.0000 mg | DELAYED_RELEASE_TABLET | Freq: Every day | ORAL | Status: DC
  Administered 2018-10-30 – 2018-11-07 (×9): 40 mg via ORAL
  Filled 2018-10-30 (×9): qty 1

## 2018-10-30 MED ORDER — CHLORHEXIDINE GLUCONATE CLOTH 2 % EX PADS: 6.0000 | MEDICATED_PAD | Freq: Every day | CUTANEOUS | Status: DC

## 2018-10-30 MED ORDER — MELATONIN 5 MG PO TABS
5.0000 mg | ORAL_TABLET | Freq: Every evening | ORAL | Status: DC | PRN
  Administered 2018-11-05: 5 mg via ORAL
  Filled 2018-10-30 (×2): qty 1

## 2018-10-30 MED ORDER — HEPARIN SODIUM (PORCINE) 1000 UNIT/ML DIALYSIS: 500.0000 [IU]/h | INTRAMUSCULAR | Status: DC | PRN

## 2018-10-30 MED ORDER — SODIUM CHLORIDE 0.9 % IV SOLN: 250.0000 mL | INTRAVENOUS | Status: DC | PRN

## 2018-10-30 MED ORDER — VANCOMYCIN HCL 10 G IV SOLR
2000.0000 mg | Freq: Once | INTRAVENOUS | Status: AC
  Administered 2018-10-30: 2000 mg via INTRAVENOUS
  Filled 2018-10-30: qty 2000

## 2018-10-30 MED ORDER — VANCOMYCIN HCL IN DEXTROSE 1-5 GM/200ML-% IV SOLN: 1000.0000 mg | Freq: Once | INTRAVENOUS | Status: DC

## 2018-10-30 MED ORDER — CHLORHEXIDINE GLUCONATE CLOTH 2 % EX PADS
6.0000 | MEDICATED_PAD | Freq: Every day | CUTANEOUS | Status: DC
  Administered 2018-10-31 – 2018-11-07 (×6): 6 via TOPICAL

## 2018-10-30 MED ORDER — ASPIRIN 81 MG PO CHEW
81.0000 mg | CHEWABLE_TABLET | Freq: Every day | ORAL | Status: DC
  Administered 2018-10-30 – 2018-11-07 (×9): 81 mg via ORAL
  Filled 2018-10-30 (×10): qty 1

## 2018-10-30 MED ORDER — SEVELAMER CARBONATE 0.8 G PO PACK
0.8000 g | PACK | Freq: Three times a day (TID) | ORAL | Status: DC
  Administered 2018-10-30 – 2018-11-07 (×19): 0.8 g via ORAL
  Filled 2018-10-30 (×29): qty 1

## 2018-10-30 MED ORDER — ATORVASTATIN CALCIUM 20 MG PO TABS
80.0000 mg | ORAL_TABLET | Freq: Every day | ORAL | Status: DC
  Administered 2018-10-30 – 2018-11-06 (×8): 80 mg via ORAL
  Filled 2018-10-30 (×8): qty 4

## 2018-10-30 MED ORDER — HEPARIN (PORCINE) IN NACL 1000-0.9 UT/500ML-% IV SOLN
INTRAVENOUS | Status: DC | PRN
  Administered 2018-10-30 (×2): 500 mL

## 2018-10-30 MED ORDER — CINACALCET HCL 30 MG PO TABS
90.0000 mg | ORAL_TABLET | Freq: Every day | ORAL | Status: DC
  Administered 2018-10-31 – 2018-11-07 (×7): 90 mg via ORAL
  Filled 2018-10-30 (×10): qty 3

## 2018-10-30 MED ORDER — IOHEXOL 300 MG/ML  SOLN
INTRAMUSCULAR | Status: DC | PRN
  Administered 2018-10-30: 05:00:00 190 mL

## 2018-10-30 MED ORDER — ARTIFICIAL TEARS OPHTHALMIC OINT
TOPICAL_OINTMENT | Freq: Two times a day (BID) | OPHTHALMIC | Status: DC
  Administered 2018-10-30 – 2018-10-31 (×2): 1 via OPHTHALMIC
  Administered 2018-10-31: 09:00:00 via OPHTHALMIC
  Administered 2018-11-01: 1 via OPHTHALMIC
  Administered 2018-11-02 – 2018-11-03 (×4): via OPHTHALMIC
  Filled 2018-10-30: qty 3.5

## 2018-10-30 MED ORDER — ACETAMINOPHEN 325 MG PO TABS: 650.0000 mg | ORAL_TABLET | ORAL | Status: DC | PRN

## 2018-10-30 MED ORDER — HYPROMELLOSE (GONIOSCOPIC) 2.5 % OP SOLN
1.0000 [drp] | OPHTHALMIC | Status: DC
  Administered 2018-10-30 – 2018-11-03 (×7): 1 [drp] via OPHTHALMIC
  Filled 2018-10-30 (×9): qty 15

## 2018-10-30 MED ORDER — HEPARIN (PORCINE) 25000 UT/250ML-% IV SOLN
INTRAVENOUS | Status: AC
  Filled 2018-10-30: qty 250

## 2018-10-30 MED ORDER — DOCUSATE SODIUM 100 MG PO CAPS
100.0000 mg | ORAL_CAPSULE | Freq: Every day | ORAL | Status: DC
  Administered 2018-10-31 – 2018-11-07 (×8): 100 mg via ORAL
  Filled 2018-10-30 (×8): qty 1

## 2018-10-30 MED ORDER — CANGRELOR BOLUS VIA INFUSION
INTRAVENOUS | Status: DC | PRN
  Administered 2018-10-30: 04:00:00 2640 ug via INTRAVENOUS

## 2018-10-30 MED ORDER — ASPIRIN 81 MG PO CHEW
324.0000 mg | CHEWABLE_TABLET | Freq: Once | ORAL | Status: AC
  Administered 2018-10-30: 324 mg via ORAL
  Filled 2018-10-30: qty 4

## 2018-10-30 MED ORDER — MORPHINE SULFATE (PF) 4 MG/ML IV SOLN
4.0000 mg | Freq: Once | INTRAVENOUS | Status: AC
  Administered 2018-10-30: 4 mg via INTRAVENOUS
  Filled 2018-10-30: qty 1

## 2018-10-30 MED ORDER — SODIUM CHLORIDE 0.9 % IV SOLN
INTRAVENOUS | Status: AC | PRN
  Administered 2018-10-30: 04:00:00 4 ug/kg/min via INTRAVENOUS

## 2018-10-30 MED ORDER — SERTRALINE HCL 50 MG PO TABS
150.0000 mg | ORAL_TABLET | Freq: Every day | ORAL | Status: DC
  Administered 2018-10-30 – 2018-11-06 (×8): 150 mg via ORAL
  Filled 2018-10-30 (×8): qty 3

## 2018-10-30 MED ORDER — OXYCODONE HCL 5 MG PO TABS: 5.0000 mg | ORAL_TABLET | ORAL | Status: DC | PRN

## 2018-10-30 MED ORDER — HEPARIN SODIUM (PORCINE) 1000 UNIT/ML IJ SOLN
INTRAMUSCULAR | Status: DC | PRN
  Administered 2018-10-30 (×2): 3000 [IU] via INTRAVENOUS
  Administered 2018-10-30: 4000 [IU] via INTRAVENOUS
  Administered 2018-10-30: 2000 [IU] via INTRAVENOUS

## 2018-10-30 MED ORDER — ONDANSETRON HCL 4 MG/2ML IJ SOLN
4.0000 mg | Freq: Four times a day (QID) | INTRAMUSCULAR | Status: DC | PRN
  Administered 2018-10-31 (×2): 4 mg via INTRAVENOUS
  Filled 2018-10-30 (×2): qty 2

## 2018-10-30 MED ORDER — LATANOPROST 0.005 % OP SOLN
1.0000 [drp] | Freq: Every day | OPHTHALMIC | Status: DC
  Administered 2018-10-30 – 2018-11-06 (×8): 1 [drp] via OPHTHALMIC
  Filled 2018-10-30 (×4): qty 2.5

## 2018-10-30 MED ORDER — PERFLUTREN LIPID MICROSPHERE
1.0000 mL | INTRAVENOUS | Status: AC | PRN
  Administered 2018-10-30: 2 mL via INTRAVENOUS
  Filled 2018-10-30: qty 10

## 2018-10-30 MED ORDER — HEPARIN SODIUM (PORCINE) 5000 UNIT/ML IJ SOLN
5000.0000 [IU] | Freq: Three times a day (TID) | INTRAMUSCULAR | Status: DC
  Administered 2018-10-30 – 2018-10-31 (×4): 5000 [IU] via SUBCUTANEOUS
  Filled 2018-10-30 (×4): qty 1

## 2018-10-30 MED ORDER — DEXTROSE 50 % IV SOLN
1.0000 | Freq: Once | INTRAVENOUS | Status: AC
  Administered 2018-10-30: 50 mL via INTRAVENOUS

## 2018-10-30 MED ORDER — DIPHENHYDRAMINE HCL 50 MG/ML IJ SOLN: 50.0000 mg | Freq: Once | INTRAMUSCULAR | Status: DC

## 2018-10-30 MED ORDER — SODIUM CHLORIDE 0.9 % IV SOLN
1.0000 g | Freq: Two times a day (BID) | INTRAVENOUS | Status: DC
  Administered 2018-10-30: 1 g via INTRAVENOUS
  Filled 2018-10-30 (×2): qty 1

## 2018-10-30 MED ORDER — MIDODRINE HCL 5 MG PO TABS
5.0000 mg | ORAL_TABLET | Freq: Three times a day (TID) | ORAL | Status: DC | PRN
  Administered 2018-10-30 – 2018-11-03 (×2): 5 mg via ORAL
  Filled 2018-10-30: qty 1

## 2018-10-30 MED ORDER — SODIUM CHLORIDE 0.9 % IV SOLN: 500.0000 mg | INTRAVENOUS | Status: DC

## 2018-10-30 MED ORDER — RENA-VITE PO TABS
1.0000 | ORAL_TABLET | Freq: Every day | ORAL | Status: DC
  Administered 2018-10-30 – 2018-11-06 (×8): 1 via ORAL
  Filled 2018-10-30 (×9): qty 1

## 2018-10-30 MED ORDER — CANGRELOR TETRASODIUM 50 MG IV SOLR
INTRAVENOUS | Status: AC
  Filled 2018-10-30: qty 50

## 2018-10-30 MED ORDER — SODIUM CHLORIDE 0.9% FLUSH: 3.0000 mL | INTRAVENOUS | Status: DC | PRN

## 2018-10-30 MED ORDER — SODIUM CHLORIDE 0.9 % IV SOLN
INTRAVENOUS | Status: DC
  Administered 2018-10-30: 03:00:00 via INTRAVENOUS

## 2018-10-30 MED ORDER — SODIUM CHLORIDE 0.9% FLUSH: 3.0000 mL | Freq: Two times a day (BID) | INTRAVENOUS | Status: DC

## 2018-10-30 MED ORDER — VERAPAMIL HCL 2.5 MG/ML IV SOLN
INTRAVENOUS | Status: AC
  Filled 2018-10-30: qty 2

## 2018-10-30 MED ORDER — NITROGLYCERIN 0.4 MG SL SUBL
0.4000 mg | SUBLINGUAL_TABLET | SUBLINGUAL | Status: DC | PRN
  Administered 2018-10-30 (×2): 0.4 mg via SUBLINGUAL
  Filled 2018-10-30: qty 1

## 2018-10-30 MED ORDER — HYDRALAZINE HCL 20 MG/ML IJ SOLN: 10.0000 mg | INTRAMUSCULAR | Status: AC | PRN

## 2018-10-30 MED ORDER — MORPHINE SULFATE (PF) 2 MG/ML IV SOLN
2.0000 mg | INTRAVENOUS | Status: DC | PRN
  Administered 2018-11-01: 2 mg via INTRAVENOUS
  Filled 2018-10-30: qty 1

## 2018-10-30 MED ORDER — CALCIUM ACETATE (PHOS BINDER) 667 MG PO CAPS
1334.0000 mg | ORAL_CAPSULE | Freq: Three times a day (TID) | ORAL | Status: DC
  Administered 2018-10-30 – 2018-11-07 (×19): 1334 mg via ORAL
  Filled 2018-10-30 (×29): qty 2

## 2018-10-30 MED ORDER — HEPARIN SODIUM (PORCINE) 1000 UNIT/ML IJ SOLN
INTRAMUSCULAR | Status: AC
  Filled 2018-10-30: qty 1

## 2018-10-30 MED ORDER — CYCLOSPORINE 0.05 % OP EMUL
1.0000 [drp] | Freq: Two times a day (BID) | OPHTHALMIC | Status: DC
  Administered 2018-10-30 – 2018-11-07 (×15): 1 [drp] via OPHTHALMIC
  Filled 2018-10-30 (×11): qty 30

## 2018-10-30 MED ORDER — LABETALOL HCL 5 MG/ML IV SOLN: 10.0000 mg | INTRAVENOUS | Status: AC | PRN

## 2018-10-30 MED ORDER — ASPIRIN EC 81 MG PO TBEC: 81.0000 mg | DELAYED_RELEASE_TABLET | Freq: Every day | ORAL | Status: DC

## 2018-10-30 MED ORDER — MIRTAZAPINE 15 MG PO TABS
15.0000 mg | ORAL_TABLET | Freq: Every day | ORAL | Status: DC
  Administered 2018-10-30 – 2018-11-06 (×8): 15 mg via ORAL
  Filled 2018-10-30 (×8): qty 1

## 2018-10-30 MED ORDER — TICAGRELOR 90 MG PO TABS
90.0000 mg | ORAL_TABLET | Freq: Two times a day (BID) | ORAL | Status: DC
  Administered 2018-10-30 – 2018-11-07 (×16): 90 mg via ORAL
  Filled 2018-10-30 (×16): qty 1

## 2018-10-30 MED ORDER — NITROGLYCERIN 1 MG/10 ML FOR IR/CATH LAB
INTRA_ARTERIAL | Status: AC
  Filled 2018-10-30: qty 10

## 2018-10-30 MED ORDER — SENNOSIDES-DOCUSATE SODIUM 8.6-50 MG PO TABS: 1.0000 | ORAL_TABLET | Freq: Two times a day (BID) | ORAL | Status: DC | PRN

## 2018-10-30 MED ORDER — HEPARIN SODIUM (PORCINE) 5000 UNIT/ML IJ SOLN
4000.0000 [IU] | Freq: Once | INTRAMUSCULAR | Status: AC
  Administered 2018-10-30: 4000 [IU] via INTRAVENOUS
  Filled 2018-10-30: qty 1

## 2018-10-30 MED ORDER — METRONIDAZOLE IN NACL 5-0.79 MG/ML-% IV SOLN: 500.0000 mg | Freq: Three times a day (TID) | INTRAVENOUS | Status: DC

## 2018-10-30 MED ORDER — DEXTROSE 50 % IV SOLN
INTRAVENOUS | Status: AC
  Filled 2018-10-30: qty 50

## 2018-10-30 MED ORDER — DIPHENHYDRAMINE HCL 50 MG/ML IJ SOLN
INTRAMUSCULAR | Status: AC
  Administered 2018-10-30: 50 mg
  Filled 2018-10-30: qty 1

## 2018-10-30 MED ORDER — HEPARIN SODIUM (PORCINE) 1000 UNIT/ML DIALYSIS: 1000.0000 [IU] | INTRAMUSCULAR | Status: DC | PRN

## 2018-10-30 MED ORDER — DORZOLAMIDE HCL-TIMOLOL MAL 2-0.5 % OP SOLN
1.0000 [drp] | Freq: Two times a day (BID) | OPHTHALMIC | Status: DC
  Administered 2018-10-30 – 2018-11-07 (×17): 1 [drp] via OPHTHALMIC
  Filled 2018-10-30 (×3): qty 10

## 2018-10-30 MED ORDER — ONDANSETRON HCL 4 MG/2ML IJ SOLN: 4.0000 mg | Freq: Four times a day (QID) | INTRAMUSCULAR | Status: DC | PRN

## 2018-10-30 MED ORDER — SODIUM CHLORIDE 0.9 % IV SOLN
4.0000 ug/kg/min | INTRAVENOUS | Status: AC
  Filled 2018-10-30: qty 50

## 2018-10-30 MED ORDER — TICAGRELOR 90 MG PO TABS
ORAL_TABLET | ORAL | Status: DC | PRN
  Administered 2018-10-30: 180 mg via ORAL

## 2018-10-30 SURGICAL SUPPLY — 16 items
BALLN EUPHORA RX 2.0X12 (BALLOONS) ×3
BALLN ~~LOC~~ EUPHORA RX 3.5X20 (BALLOONS) ×3
BALLOON EUPHORA RX 2.0X12 (BALLOONS) ×1 IMPLANT
BALLOON ~~LOC~~ EUPHORA RX 3.5X20 (BALLOONS) ×1 IMPLANT
CANNULA 5F STIFF (CANNULA) ×3 IMPLANT
CATH INFINITI 5FR ANG PIGTAIL (CATHETERS) ×3 IMPLANT
CATH INFINITI JR4 5F (CATHETERS) ×3 IMPLANT
CATH VISTA GUIDE 6FR XB3.5 (CATHETERS) ×3 IMPLANT
DEVICE INFLAT 30 PLUS (MISCELLANEOUS) ×3 IMPLANT
KIT MANI 3VAL PERCEP (MISCELLANEOUS) ×3 IMPLANT
PACK CARDIAC CATH (CUSTOM PROCEDURE TRAY) ×3 IMPLANT
SHEATH AVANTI 6FR X 11CM (SHEATH) ×3 IMPLANT
STENT SYNERGY DES 3.5X16 (Permanent Stent) ×3 IMPLANT
STENT SYNERGY DES 3X28 (Permanent Stent) ×3 IMPLANT
WIRE GUIDERIGHT .035X150 (WIRE) ×3 IMPLANT
WIRE RUNTHROUGH .014X180CM (WIRE) ×3 IMPLANT

## 2018-10-30 NOTE — ED Notes (Signed)
Code STEMI called by Charna Archer MD

## 2018-10-30 NOTE — H&P (Signed)
[facility] [place] NAME: William Carpenter    MR#:  [id number]  DATE OF BIRTH:  December 21, 1955  DATE OF ADMISSION:  10/30/2018  PRIMARY CARE PHYSICIAN: Raelyn Mora, MD   REQUESTING/REFERRING PHYSICIAN: End Harrell Gave, MD  CHIEF COMPLAINT:  No chief complaint on file.  Chest pain  HISTORY OF PRESENT ILLNESS:  63 y.o. male with pertinent past medical history of ESRD on HD (MWF), hypertension, hyperlipidemia, renal cell carcinoma, chronic pain syndrome and FSGS presenting to the ED with chief complaints of chest pain and fever.  Patient states he went to bed last night at around 11 and woke up with neck pain and left arm spasm, he reported neck pain is been ongoing since afternoon and evening prior to going to bed.  He states that he started having chest pain describing as "vice like" with associated shortness of breath and dry cough.  Denies palpitation, diaphoresis, nausea or vomiting, abdominal pain, or diarrhea. Patient states been feeling hot and cold intermittently but given new onset of chest pain he decided to call EMS.  On EMS arrival patient was noted to have fever of 102 and was placed on 2 L nasal cannula.   On arrival to the ED, he was afebrile with blood pressure 224/114 mm Hg and pulse rate 81 beats/min. There were no focal neurological deficits; he was alert and oriented x4, but noted to be in significant pain.  Initial ECG showed anterior ST segment elevation.  Initial labs revealed troponin I 32, lactic 1.2, BUN 60, creatinine 7.25, alk phos 212, bilirubin 1.7, WBC 12.7, lipase 53.  Given ongoing chest pain patient received aspirin, nitroglycerin, and IV heparin with minimal improvement in his pain.  Given EKG findings and ongoing chest pain, code STEMI was initiated and patient evaluated by on-call cardiologist who recommended patient be taken to Cath Lab for emergent left heart catheterization with possible PCI.  PAST MEDICAL  HISTORY:   Past Medical History:  Diagnosis Date  . Bullous dermatosis   . Dialysis patient ([facility])   . Dysphagia   . FSGS (focal segmental glomerulosclerosis)   . Hyperlipidemia   . Renal insufficiency   . Spinal stenosis, cervical region     PAST SURGICAL HISTORY:   Past Surgical History:  Procedure Laterality Date  . CARPAL TUNNEL RELEASE    . SPINAL FUSION  2018  . spleen  2015   removed    SOCIAL HISTORY:   Social History   Tobacco Use  . Smoking status: Never Smoker  . Smokeless tobacco: Never Used  Substance Use Topics  . Alcohol use: No    FAMILY HISTORY:   Family History  Problem Relation Age of Onset  . Cancer Mother   . COPD Mother   . Heart disease Father   . Parkinson's disease Father     DRUG ALLERGIES:   Allergies  Allergen Reactions  . Albumin (Human) Hives  . Aloe   . Iodinated Diagnostic Agents   . Iron Dextran   . Metoprolol Tartrate   . Penicillins Swelling  . Quinine Sulfate [Quinine]   . Rabeprazole   . Sulfa Antibiotics Other (See Comments)  . Tape Other (See Comments)    Removes skin.  . Trimethoprim     REVIEW OF SYSTEMS:   Review of Systems  Constitutional: Positive for chills and fever. Negative for malaise/fatigue and weight loss.  HENT: Negative for congestion, hearing loss and sore throat.   Eyes:  Negative for blurred vision and double vision.  Respiratory: Positive for cough, sputum production and shortness of breath. Negative for wheezing.   Cardiovascular: Positive for chest pain. Negative for palpitations, orthopnea and leg swelling.  Gastrointestinal: Negative for abdominal pain, diarrhea, nausea and vomiting.  Genitourinary: Negative for dysuria and urgency.  Musculoskeletal: Positive for back pain, joint pain and neck pain. Negative for myalgias.  Skin: Negative for rash.  Neurological: Negative for dizziness, sensory change, speech change, focal weakness and headaches.  Psychiatric/Behavioral: Negative for  depression.   MEDICATIONS AT HOME:   Prior to Admission medications   Medication Sig Start Date End Date Taking? Authorizing Provider  acetaminophen (TYLENOL) 325 MG tablet Take 650 mg by mouth every 8 (eight) hours as needed (pain or fever).     [provider]  amLODipine (NORVASC) 5 MG tablet Take 5 mg by mouth daily.     [provider]  atorvastatin (LIPITOR) 40 MG tablet Take 20 mg by mouth daily.     [provider]  b complex-vitamin c-folic acid (NEPHRO-VITE) 0.8 MG TABS tablet Take 1 tablet by mouth at bedtime.    [provider]  betamethasone dipropionate (DIPROLENE) 0.05 % ointment Apply topically 2 (two) times daily.    [provider]  bisacodyl (DULCOLAX) 10 MG suppository Place 10 mg rectally daily as needed for moderate constipation.    [provider]  calcium acetate (PHOSLO) 667 MG capsule Take 1,334 mg by mouth 3 (three) times daily with meals.     [provider]  Carboxymethylcellul-Glycerin (REFRESH OPTIVE SENSITIVE OP) Place 1 drop into both eyes every 4 (four) hours.    [provider]  carvedilol (COREG) 25 MG tablet Take 25 mg by mouth 2 (two) times daily with a meal.     [provider]  cetirizine (ZYRTEC) 10 MG tablet Take 10 mg by mouth daily.    [provider]  cinacalcet (SENSIPAR) 90 MG tablet Take 90 mg by mouth daily.    [provider]  clobetasol ointment (TEMOVATE) 1.50 % Apply 1 application topically 2 (two) times daily.    [provider]  docusate sodium (COLACE) 100 MG capsule Take 100 mg by mouth daily.    [provider]  dorzolamidel-timolol (COSOPT) 22.3-6.8 MG/ML SOLN ophthalmic solution 1 drop 2 (two) times daily.    [provider]  hydrALAZINE (APRESOLINE) 10 MG tablet Take 10 mg by mouth every 4 (four) hours as needed (systolic BP >[id number]).     [provider]  hydrOXYzine (VISTARIL) 25 MG capsule Take 25 mg  by mouth 3 (three) times daily as needed.    [provider]  ketoconazole (NIZORAL) 2 % shampoo Apply 1 application topically 2 (two) times a week.    [provider]  lactulose (CHRONULAC) 10 GM/15ML solution Take by mouth 3 (three) times daily.    [provider]  latanoprost (XALATAN) 0.005 % ophthalmic solution Place 1 drop into both eyes at bedtime.    [provider]  losartan (COZAAR) 50 MG tablet Take 50 mg by mouth daily. Take one Tuesday, Thursday, Saturday, Sunday    [provider]  Melatonin 5 MG CAPS Take by mouth.    [provider]  methocarbamol (ROBAXIN) 500 MG tablet Take 500 mg by mouth 2 (two) times daily.     [provider]  mirtazapine (REMERON) 15 MG tablet Take 15 mg by mouth at bedtime.    [provider]  mupirocin cream (BACTROBAN) 2 % Apply 1 application topically 2 (two) times daily. 09/30/16   Frederich Cha, MD  naloxone [facility]) nasal spray 4 mg/0.1 mL Place 1 spray into the nose.    [provider]  omeprazole (PRILOSEC) 20 MG capsule Take 20 mg by mouth 2 (two) times daily before a meal.    [provider]  oxycodone (OXY-IR) 5 MG capsule Take 5-10 mg by mouth every 4 (four) hours as needed for pain.     [provider]  polyvinyl alcohol (LIQUIFILM TEARS) 1.4 % ophthalmic solution Place 1 drop into both eyes 4 (four) times daily as needed for dry eyes.    [provider]  Psyllium (KONSYL-D PO) Take 5 mLs by mouth 2 (two) times daily.    [provider]  senna-docusate (SENOKOT-S) 8.6-50 MG tablet Take 1 tablet by mouth 2 (two) times daily as needed (to prevent constipation; scheduled until bowel movement).     [provider]  sertraline (ZOLOFT) 50 MG tablet Take 150 mg by mouth at bedtime.    [provider]  sevelamer carbonate (RENVELA) 0.8 g PACK packet Take 0.8 g by mouth 3 (three) times daily with meals.    [provider]  terbinafine (LAMISIL) 1 % cream Apply 1 application topically 2 (two) times daily.    [provider]      VITAL SIGNS:  Blood pressure 97/72, pulse 70, temperature 98.3 F (36.8 C), temperature source Oral, resp. rate 19, height 5' 9"  (1.753 m), weight 88 kg, SpO2 (!) 88 %.  PHYSICAL EXAMINATION:   Physical Exam  GENERAL:  63 y.o.-year-old patient lying in the bed with no acute distress.  EYES: Pupils equal, round, reactive to light and accommodation. No scleral icterus. Extraocular muscles intact.  HEENT: Head atraumatic, normocephalic. Oropharynx and nasopharynx clear.  NECK:  Supple, no jugular venous distention. No thyroid enlargement, no tenderness.  LUNGS: Normal breath sounds bilaterally, no wheezing, rales,rhonchi or crepitation. No use of accessory muscles of respiration.  CARDIOVASCULAR: S1, S2 normal. No murmurs, rubs, or gallops.  ABDOMEN: Soft, nontender, nondistended. Bowel sounds present. No organomegaly or mass.  EXTREMITIES: No pedal edema, cyanosis, or clubbing.  Right femoral artery sheath in place site clean and dry NEUROLOGIC: Cranial nerves II through XII are intact. Muscle strength 5/5 in all extremities. Sensation intact. Gait not checked.  PSYCHIATRIC: The patient is alert and oriented x 3.  SKIN: No obvious rash, lesion, or ulcer.   DATA REVIEWED:  LABORATORY PANEL:   CBC Recent Labs  Lab 10/30/18 0510  WBC 14.2*  HGB 12.8*  HCT 40.1  PLT 259   ------------------------------------------------------------------------------------------------------------------  Chemistries  Recent Labs  Lab 10/30/18 0216 10/30/18 0510  NA 135 136  K 4.9 4.8  CL 94* 97*  CO2 25 24  GLUCOSE 104* 88  BUN 60* 63*  CREATININE 7.25* 7.35*  CALCIUM 8.6* 8.1*  AST 26  --   ALT 18  --   ALKPHOS 212*  --   BILITOT 1.7*  --     ------------------------------------------------------------------------------------------------------------------  Cardiac Enzymes No results for input(s): TROPONINI in the last 168 hours. ------------------------------------------------------------------------------------------------------------------  RADIOLOGY:  Dg Chest Port 1 View  Result Date: 10/30/2018 CLINICAL DATA:  63 year old male with fever. EXAM: PORTABLE CHEST 1 VIEW COMPARISON:  Chest radiograph dated 11/22/2017. FINDINGS: There is shallow inspiration with left lung base atelectasis or infiltrate. Left peripheral and subpleural streaky densities may represent atelectatic changes or developing infiltrate. There is an increased  density in the right hilar region which may represent confluence of prominent hilar vessels and related to underlying pulmonary hypertension. An infiltrate or mass is not excluded. Clinical correlation and follow-up to resolution recommended. There is atherosclerotic calcification of the aortic arch. Increased tissue density surrounding the aortic arch may represent atelectatic changes in the adjacent lung. If there is clinical concern for acute aortic pathology further evaluation with CT with IV contrast is recommended. No large pleural effusion or pneumothorax. Stable cardiomegaly. No acute osseous pathology. Cervical ACDF. IMPRESSION: 1. Left lung base streaky densities may represent atelectasis or infiltrate. 2. Increased density in the right hilar region may represent dilated hilar vessels. An infiltrate or mass is not excluded. Clinical correlation and follow-up to resolution recommended. 3. Crescentic density adjacent to the aortic arch may represent atelectasis in the adjacent lung. If there is clinical concern for acute aortic pathology, further evaluation with CT with IV contrast is recommended. 4. Stable cardiomegaly. Electronically Signed   By: Anner Crete M.D.   On: 10/30/2018 02:44    EKG:   EKG: normal EKG, normal sinus rhythm, unchanged from previous tracings, ST elevation in V3 and V4.  IMPRESSION AND PLAN:   63 y.o. male with pertinent past medical history of ESRD on HD (MWF), hypertension, hyperlipidemia, renal cell carcinoma, chronic pain syndrome and FSGS presenting to the ED with chief complaints of chest pain and fever  1. STEMI / Unstable Angina (dynamic EKG changes with abnormal cardiac enzymes)  - s/p emergent left heart catheterization.  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  Treated with 2 overlapping drug-eluting stents - Admit to ICU for post cath care - Trend troponin - TTEcho to Assess LV Function  - Continue cangrelor for 2 hours from the time of ticagrelor administration at the end of PCI - DUAP with aspirin and Ticagrelor x 12 months - Right femoral artery sheath in place to be removed with manual compression once ACT is fallen below 175 seconds per cardiology - NTG + morphine PRN chest pain -Hold if hypotensive - Cardiology input appreciated  2. Sepsis -patient presenting with fever and leukocytosis.  Recent treatment for pneumonia - Chest x-ray shows questionable left lung base streaky densities concerning for atelectasis versus infiltrates - Trend Lactic acid - Covid negative - Check procalcitonin - UA/Urine cultures pending - Blood cultures pending - Start Empiric abx with meropenem as patient is allergic to penicillin  3. Ischemic cardiomyopathy   Last Echo showed moderately to severely reduced LVEF - Echo as above - Hold carvedilol, losartan, and hydralazine on hold in the setting of hypertension, may need to restart as BP allows - Low salt diet  - Check daily weight - Strict I&Os - Cardiology following  4. ESRD on HD (MWF) - Last HD on Friday, need HD during this hospitalization - BUN/creatinine slightly elevated above baseline - Hold nephrotoxins - Nephrology consult placed  5. HLD  +  Goal LDL<70 - Atorvastatin 49m PO qhs  6. Hypertension-now hypotensive secondary to sepsis versus cardiogenic shock - Hold-antihypertensives  DVT prophylaxis-holding post procedure  All the records are reviewed and case discussed with ED provider. Management plans discussed with the patient, family and they are in agreement.  CODE STATUS: FULL  TOTAL TIME TAKING CARE OF THIS PATIENT: 60 minutes.    on 10/30/2018 at 5:57 AM   ERufina Falco DNP, FNP-BC [facility] Nurse Practitioner Between 7am to 6pm - Pager [phone] After  6pm go to [email or web address] - password [email or web address] [facility]  Office  [phone]  CC: Primary care physician; Raelyn Mora, MD

## 2018-10-30 NOTE — Progress Notes (Signed)
A Code Sepsis was called out to William Carpenter at [place] but has now turned into a Code STEMI, LA of 1.2

## 2018-10-30 NOTE — Progress Notes (Signed)
Pre HD Assessment    10/30/18 1410  Neurological  Level of Consciousness Alert  Orientation Level Oriented X4  Respiratory  Respiratory Pattern Regular;Unlabored  Chest Assessment Chest expansion symmetrical  Bilateral Breath Sounds Clear;Diminished  Cough None  Cardiac  Pulse Regular  Heart Sounds S1, S2  ECG Monitor Yes  Cardiac Rhythm SB  Vascular  R Radial Pulse +2  L Radial Pulse +2  Psychosocial  Psychosocial (WDL) WDL

## 2018-10-30 NOTE — Progress Notes (Signed)
CARE PLAN  Anterior STEMI: Patient's presentation is consistent with acute MI, likely due to plaque rupture and thrombus formation in the mid LAD.  The patient has severe multivessel CAD involving other territories as well.  He is status post primary PCI to the proximal and mid LAD with marked improvement in his chest pain.  Follow up Cardiology recs  HTN emergency-pain and BOP control   Corrin Parker, M.D.  [facility]  Medical Director [place] Director [facility] Cardio-Pulmonary Department

## 2018-10-30 NOTE — Progress Notes (Signed)
Pre HD Tx   10/30/18 1400  Hand-Off documentation  Report given to (Full Name) Beatris Ship, RN   Report received from (Full Name) Charyl Bigger, RN  Vital Signs  Temp 97.8 F (36.6 C)  Temp Source Axillary  Pulse Rate (!) 53  Resp 19  BP 98/67  BP Location Right Arm  BP Method Automatic  Patient Position (if appropriate) Lying  Oxygen Therapy  SpO2 93 %  O2 Device Room Air  Pulse Oximetry Type Continuous  Pain Assessment  Pain Scale 0-10  Pain Score 0  Dialysis Weight  Weight 88 kg  Type of Weight Pre-Dialysis  Time-Out for Hemodialysis  What Procedure? HD  Pt Identifiers(min of two) First/Last Name;MRN/Account#  Correct Site? Yes  Correct Side? Yes  Correct Procedure? Yes  Consents Verified? Yes  Rad Studies Available? N/A  Safety Precautions Reviewed? Yes  Engineer, civil (consulting) Number 3  Station Number  (Bedside ICU 14)  UF/Alarm Test Passed  Conductivity: Meter 13.8  Conductivity: Machine  13.8  pH 7.2  Reverse Osmosis Portable WRO 4  Normal Saline Lot Number [id number]  Dialyzer Lot Number [id number]  Disposable Set Lot Number [id number]  Machine Temperature 98.6 F (37 C)  Musician and Audible Yes  Blood Lines Intact and Secured Yes  Pre Treatment Patient Checks  Vascular access used during treatment Fistula  HD catheter dressing before treatment  (NA)  Hepatitis B Surface Antigen Results Pending  Isolation Initiated Yes  Hepatitis B Surface Antibody  (unknown )  Date Hepatitis B Surface Antibody Drawn 10/30/18  Hemodialysis Consent Verified Yes  Hemodialysis Standing Orders Initiated Yes  ECG (Telemetry) Monitor On Yes  Prime Ordered Normal Saline  Length of  DialysisTreatment -hour(s) 3.5 Hour(s)  Dialysis Treatment Comments Na 140  Dialyzer Elisio 17H NR  Dialysate 2K;2.5 Ca  Dialysis Anticoagulant None  Dialysate Flow Ordered 600  Blood Flow Rate Ordered 400 mL/min  Ultrafiltration Goal 1.5 Liters  Dialysis Blood Pressure Support  Ordered Normal Saline  Education / Care Plan  Dialysis Education Provided Yes  Documented Education in Care Plan Yes  Fistula / Graft Left Forearm Arteriovenous fistula  No Placement Date or Time found.   Placed prior to admission: Yes  Orientation: Left  Access Location: Forearm  Access Type: Arteriovenous fistula  Site Condition No complications  Fistula / Graft Assessment Present;Thrill;Bruit  Status Patent

## 2018-10-30 NOTE — Progress Notes (Signed)
HD Tx completed tolerated well, UF goal met.    10/30/18 1745  Vital Signs  Pulse Rate 62  Pulse Rate Source Monitor  Resp 18  BP 105/74  BP Location Right Arm  BP Method Automatic  Patient Position (if appropriate) Lying  Oxygen Therapy  SpO2 96 %  O2 Device Room Air  Pulse Oximetry Type Continuous  During Hemodialysis Assessment  HD Safety Checks Performed Yes  KECN 79.3 KECN  Dialysis Fluid Bolus Normal Saline  Bolus Amount (mL) 250 mL  Intra-Hemodialysis Comments Tx completed;Tolerated well

## 2018-10-30 NOTE — Progress Notes (Signed)
Post HD Assessment    10/30/18 1755  Neurological  Level of Consciousness Alert  Orientation Level Oriented X4  Respiratory  Respiratory Pattern Regular;Unlabored  Chest Assessment Chest expansion symmetrical  Bilateral Breath Sounds Clear;Diminished  Cough None  Cardiac  Pulse Regular  Heart Sounds S1, S2  ECG Monitor Yes  Cardiac Rhythm SB  Vascular  R Radial Pulse +2  L Radial Pulse +2  Edema Generalized  Psychosocial  Psychosocial (WDL) WDL

## 2018-10-30 NOTE — Progress Notes (Signed)
Post HD Tx    10/30/18 1750  Vital Signs  Temp 97.6 F (36.4 C)  Temp Source Oral  Pulse Rate 60  Pulse Rate Source Monitor  Resp 13  BP 109/79  BP Location Right Arm  BP Method Automatic  Patient Position (if appropriate) Lying  Oxygen Therapy  SpO2 98 %  O2 Device Room Air  Pulse Oximetry Type Continuous  Pain Assessment  Pain Scale 0-10  Pain Score 0  Dialysis Weight  Weight 86.5 kg  Type of Weight Post-Dialysis  Post-Hemodialysis Assessment  Rinseback Volume (mL) 250 mL  KECN 79.3 V  Dialyzer Clearance Lightly streaked  Duration of HD Treatment -hour(s) 3.5 hour(s)  Hemodialysis Intake (mL) 500 mL  UF Total -Machine (mL) 2005 mL  Net UF (mL) 1505 mL  Tolerated HD Treatment Yes  AVG/AVF Arterial Site Held (minutes) 10 minutes  AVG/AVF Venous Site Held (minutes) 10 minutes  Fistula / Graft Left Forearm Arteriovenous fistula  No Placement Date or Time found.   Placed prior to admission: Yes  Orientation: Left  Access Location: Forearm  Access Type: Arteriovenous fistula  Site Condition No complications  Fistula / Graft Assessment Present;Thrill;Bruit  Status Deaccessed  Drainage Description None

## 2018-10-30 NOTE — ED Provider Notes (Signed)
[facility] Emergency Department Provider Note   ____________________________________________   First MD Initiated Contact with Patient 10/30/18 0206     (approximate)  I have reviewed the triage vital signs and the nursing notes.   HISTORY  Chief Complaint Chest Pain   HPI William Carpenter is a 63 y.o. 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  Patient reports he developed acute severe left-sided chest pain that woke him from sleep around 1230 this morning.  He describes a severe squeezing pain that "feels like a vice".  This is been associated with some shortness of breath and is not exacerbated or alleviated by anything, remains severe upon arrival to the ED.  He states he has been coughing lately, recently completed a course of antibiotics for pneumonia, but now the cough seems worse.  Per EMS, patient had a fever of 102.0 and he was placed on 2 L nasal cannula for comfort.  Patient reports receiving dialysis 2 days ago and is due for dialysis later today.        Past Medical History:  Diagnosis Date   Bullous dermatosis    Dialysis patient [facility])    Dysphagia    FSGS (focal segmental glomerulosclerosis)    Hyperlipidemia    Renal insufficiency    Spinal stenosis, cervical region     Patient Active Problem List   Diagnosis Date Noted   STEMI (ST elevation myocardial infarction) ([place]) 10/30/2018   STEMI involving left anterior descending coronary artery ([place]) 10/30/2018   Pressure injury of skin 11/23/2017   ESRD on dialysis ([place]) 11/22/2017   HLD (hyperlipidemia) 11/22/2017   Intramuscular hematoma 11/22/2017   Acalculous cholecystitis    Leukocytosis 07/12/2016   Hypotension 07/12/2016    Past Surgical History:  Procedure Laterality Date   CARPAL TUNNEL RELEASE     SPINAL FUSION  2018   spleen  2015   removed    Prior to Admission medications    Medication Sig Start Date End Date Taking? Authorizing Provider  acetaminophen (TYLENOL) 325 MG tablet Take 650 mg by mouth every 8 (eight) hours as needed (pain or fever).     [provider]  amLODipine (NORVASC) 5 MG tablet Take 5 mg by mouth daily.     [provider]  atorvastatin (LIPITOR) 40 MG tablet Take 20 mg by mouth daily.     [provider]  b complex-vitamin c-folic acid (NEPHRO-VITE) 0.8 MG TABS tablet Take 1 tablet by mouth at bedtime.    [provider]  betamethasone dipropionate (DIPROLENE) 0.05 % ointment Apply topically 2 (two) times daily.    [provider]  bisacodyl (DULCOLAX) 10 MG suppository Place 10 mg rectally daily as needed for moderate constipation.    [provider]  calcium acetate (PHOSLO) 667 MG capsule Take 1,334 mg by mouth 3 (three) times daily with meals.     [provider]  Carboxymethylcellul-Glycerin (REFRESH OPTIVE SENSITIVE OP) Place 1 drop into both eyes every 4 (four) hours.    [provider]  carvedilol (COREG) 25 MG tablet Take 25 mg by mouth 2 (two) times daily with a meal.     [provider]  cetirizine (ZYRTEC) 10 MG tablet Take 10 mg by mouth daily.    [provider]  cinacalcet (SENSIPAR) 90 MG tablet Take 90 mg by mouth daily.    [provider]  clobetasol ointment (TEMOVATE)  8.31 % Apply 1 application topically 2 (two) times daily.    [provider]  docusate sodium (COLACE) 100 MG capsule Take 100 mg by mouth daily.    [provider]  dorzolamidel-timolol (COSOPT) 22.3-6.8 MG/ML SOLN ophthalmic solution 1 drop 2 (two) times daily.    [provider]  hydrALAZINE (APRESOLINE) 10 MG tablet Take 10 mg by mouth every 4 (four) hours as needed (systolic BP >517OHYW).     [provider]  hydrOXYzine (VISTARIL) 25 MG capsule Take 25 mg by mouth 3 (three) times daily as needed.    [provider]   ketoconazole (NIZORAL) 2 % shampoo Apply 1 application topically 2 (two) times a week.    [provider]  lactulose (CHRONULAC) 10 GM/15ML solution Take by mouth 3 (three) times daily.    [provider]  latanoprost (XALATAN) 0.005 % ophthalmic solution Place 1 drop into both eyes at bedtime.    [provider]  losartan (COZAAR) 50 MG tablet Take 50 mg by mouth daily. Take one Tuesday, Thursday, Saturday, Sunday    [provider]  Melatonin 5 MG CAPS Take by mouth.    [provider]  methocarbamol (ROBAXIN) 500 MG tablet Take 500 mg by mouth 2 (two) times daily.     [provider]  mirtazapine (REMERON) 15 MG tablet Take 15 mg by mouth at bedtime.    [provider]  mupirocin cream (BACTROBAN) 2 % Apply 1 application topically 2 (two) times daily. 09/30/16   Frederich Cha, MD  naloxone [facility]) nasal spray 4 mg/0.1 mL Place 1 spray into the nose.    [provider]  omeprazole (PRILOSEC) 20 MG capsule Take 20 mg by mouth 2 (two) times daily before a meal.    [provider]  oxycodone (OXY-IR) 5 MG capsule Take 5-10 mg by mouth every 4 (four) hours as needed for pain.     [provider]  polyvinyl alcohol (LIQUIFILM TEARS) 1.4 % ophthalmic solution Place 1 drop into both eyes 4 (four) times daily as needed for dry eyes.    [provider]  Psyllium (KONSYL-D PO) Take 5 mLs by mouth 2 (two) times daily.    [provider]  senna-docusate (SENOKOT-S) 8.6-50 MG tablet Take 1 tablet by mouth 2 (two) times daily as needed (to prevent constipation; scheduled until bowel movement).     [provider]  sertraline (ZOLOFT) 50 MG tablet Take 150 mg by mouth at bedtime.    [provider]  sevelamer carbonate (RENVELA) 0.8 g PACK packet Take 0.8 g by mouth 3 (three) times daily with meals.    [provider]  terbinafine (LAMISIL) 1 % cream Apply 1 application topically  2 (two) times daily.    [provider]    Allergies Albumin (human), Aloe, Iodinated diagnostic agents, Iron dextran, Metoprolol tartrate, Penicillins, Quinine sulfate [quinine], Rabeprazole, Sulfa antibiotics, Tape, and Trimethoprim  Family History  Problem Relation Age of Onset   Cancer Mother    COPD Mother    Heart disease Father    Parkinson's disease Father     Social History Social History   Tobacco Use   Smoking status: Never Smoker   Smokeless tobacco: Never Used  Substance Use Topics   Alcohol use: No   Drug use: No    Review of Systems  Constitutional: No fever/chills Eyes: No visual changes. ENT: No sore throat. Cardiovascular: Positive for chest pain. Respiratory: Positive for shortness  of breath. Gastrointestinal: No abdominal pain.  No nausea, no vomiting.  No diarrhea.  No constipation. Genitourinary: Negative for dysuria. Musculoskeletal: Negative for back pain. Skin: Negative for rash. Neurological: Negative for headaches, focal weakness or numbness.  ____________________________________________   PHYSICAL EXAM:  VITAL SIGNS: ED Triage Vitals  Enc Vitals Group     BP      Pulse      Resp      Temp      Temp src      SpO2      Weight      Height      Head Circumference      Peak Flow      Pain Score      Pain Loc      Pain Edu?      Excl. in [place]?     Constitutional: Alert and oriented.  Uncomfortable appearing. Eyes: Conjunctivae are normal. Head: Atraumatic. Nose: No congestion/rhinnorhea. Mouth/Throat: Mucous membranes are moist. Neck: Normal ROM Cardiovascular: Normal rate, regular rhythm. Grossly normal heart sounds.  Left upper extremity AV fistula with palpable thrill. Respiratory: Normal respiratory effort.  No retractions. Lungs CTAB. Gastrointestinal: Soft and nontender. No distention. Genitourinary: deferred Musculoskeletal: No lower extremity tenderness nor edema. Neurologic:  Normal speech and  language. No gross focal neurologic deficits are appreciated. Skin:  Skin is warm, dry and intact. No rash noted. Psychiatric: Mood and affect are normal. Speech and behavior are normal.  ____________________________________________   LABS (all labs ordered are listed, but only abnormal results are displayed)  Labs Reviewed  COMPREHENSIVE METABOLIC PANEL - Abnormal; Notable for the following components:      Result Value   Chloride 94 (*)    Glucose, Bld 104 (*)    BUN 60 (*)    Creatinine, Ser 7.25 (*)    Calcium 8.6 (*)    Total Protein 9.3 (*)    Alkaline Phosphatase 212 (*)    Total Bilirubin 1.7 (*)    GFR calc non Af Amer 7 (*)    GFR calc Af Amer 8 (*)    Anion gap 16 (*)    All other components within normal limits  CBC WITH DIFFERENTIAL/PLATELET - Abnormal; Notable for the following components:   WBC 12.7 (*)    RBC 3.94 (*)    MCV 107.4 (*)    MCH 34.3 (*)    Neutro Abs 7.8 (*)    Monocytes Absolute 1.2 (*)    All other components within normal limits  LIPASE, BLOOD - Abnormal; Notable for the following components:   Lipase 53 (*)    All other components within normal limits  LIPID PANEL - Abnormal; Notable for the following components:   Cholesterol 207 (*)    Triglycerides 229 (*)    HDL 40 (*)    VLDL 46 (*)    LDL Cholesterol 121 (*)    All other components within normal limits  GLUCOSE, CAPILLARY - Abnormal; Notable for the following components:   Glucose-Capillary 67 (*)    All other components within normal limits  TROPONIN I (HIGH SENSITIVITY) - Abnormal; Notable for the following components:   Troponin I (High Sensitivity) 132 (*)    All other components within normal limits  SARS CORONAVIRUS 2 BY RT PCR (HOSPITAL ORDER, [facility])  CULTURE, BLOOD (ROUTINE X 2)  CULTURE, BLOOD (ROUTINE X 2)  URINE CULTURE  LACTIC ACID, PLASMA  APTT  PROTIME-INR  PROCALCITONIN  LACTIC  ACID, PLASMA  URINALYSIS, ROUTINE W REFLEX  MICROSCOPIC  BASIC METABOLIC PANEL  CBC  POCT ACTIVATED CLOTTING TIME  POCT ACTIVATED CLOTTING TIME  POCT ACTIVATED CLOTTING TIME  TROPONIN I (HIGH SENSITIVITY)   ____________________________________________  EKG  ED ECG REPORT I, Blake Divine, the attending physician, personally viewed and interpreted this ECG.   Date: 10/30/2018  EKG Time: 2:19  Rate: 93  Rhythm: normal sinus rhythm  Axis: Normal  Intervals:none  ST&T Change: STE in V3 and V4   PROCEDURES  Procedure(s) performed (including Critical Care):  .Critical Care Performed by: Blake Divine, MD Authorized by: Blake Divine, MD   Critical care provider statement:    Critical care time (minutes):  45   Critical care time was exclusive of:  Separately billable procedures and treating other patients and teaching time   Critical care was necessary to treat or prevent imminent or life-threatening deterioration of the following conditions:  Cardiac failure   Critical care was time spent personally by me on the following activities:  Discussions with consultants, evaluation of patient's response to treatment, examination of patient, ordering and performing treatments and interventions, ordering and review of laboratory studies, ordering and review of radiographic studies, pulse oximetry, re-evaluation of patient's condition, obtaining history from patient or surrogate and review of old charts   I assumed direction of critical care for this patient from another provider in my specialty: no       ____________________________________________   INITIAL IMPRESSION / ASSESSMENT AND PLAN / ED COURSE       63 year old male with history of ESRD on HD presents to the ED with acute onset of viselike pain to his left chest around 1230 this morning.  He has ongoing severe pain upon arrival to the ED and initial EKG was concerning for ST elevation in V3 and V4 without reciprocal changes.  This was repeated due to concern  for contributing artifact, however continued to show similar ST elevation and code STEMI was initiated.  There is also concern for pneumonia and sepsis, given fever reported by EMS, however patient is afebrile here on multiple checks, including with rectal temp.  Chest x-ray shows questionable pneumonia, although would consider pneumonia less likely than ACS.  Mediastinum initially appeared within normal limits and patient was given heparin bolus.  Chest x-ray subsequently read by radiology with concern for crescentic opacification in the area of patient's aortic arch.  Would also consider acute aortic pathology less likely than ACS given patient's description of symptoms.  This was discussed with Dr. Saunders Revel of cardiology, who will plan on cardiac catheterization and if coronaries unremarkable, further investigation of aorta.  Patient unfortunately has allergy to contrast dye, states he has tolerated in the past with premedication.  Patient to be given dose of Solu-Medrol and Benadryl at the direction of Dr. Saunders Revel.  Patient taken to the Cath Lab, he had a slight drop in blood pressure following nitroglycerin, however this improved following IV fluid administration.      ____________________________________________   FINAL CLINICAL IMPRESSION(S) / ED DIAGNOSES  Final diagnoses:  ST elevation myocardial infarction (STEMI), unspecified artery ([place])  Chest pain of uncertain etiology     ED Discharge Orders         Ordered    AMB Referral to Cardiac Rehabilitation - Phase II     10/30/18 0452           Note:  This document was prepared using Dragon voice recognition software and may include unintentional dictation  errors.   Blake Divine, MD 10/30/18 [phone]

## 2018-10-30 NOTE — Progress Notes (Signed)
Pharmacy Antibiotic Note  William Carpenter is a 63 y.o. male admitted on 10/30/2018 with sepsis and STEMI taken to cath for PCI w/ leukocytosis w/ streaky densities s/t atelectasis vs. infiltrate.  Pharmacy has been consulted for vanc/meropenem dosing.  Plan: Will give patient vanc 2g IV load   Patient has a h/o FSGS w/ ESRD on dialysis, unsure of patient's dialysis schedule. Will place an order placeholder for vanc 1g IV prn qHD. Please follow-up on patient's hemodialysis schedule to adjust dosing and order pre-HD level.  Will start meropenem 1g IV q12h per CrCl 10 - < 25 ml/min, patient has an anaphylactic reaction to penicillins w/o h/o of cephalosporin administration.  Height: 5\' 9"  (175.3 cm) Weight: 194 lb 0.1 oz (88 kg) IBW/kg (Calculated) : 70.7  Temp (24hrs), Avg:98.6 F (37 C), Min:98.2 F (36.8 C), Max:99.2 F (37.3 C)  Recent Labs  Lab 10/30/18 0216 10/30/18 0510  WBC 12.7* 14.2*  CREATININE 7.25* 7.35*  LATICACIDVEN 1.2  --     Estimated Creatinine Clearance: 11.3 mL/min (A) (by C-G formula based on SCr of 7.35 mg/dL (H)).    Allergies  Allergen Reactions  . Albumin (Human) Hives  . Aloe   . Iodinated Diagnostic Agents   . Iron Dextran   . Metoprolol Tartrate   . Penicillins Swelling  . Quinine Sulfate [Quinine]   . Rabeprazole   . Sulfa Antibiotics Other (See Comments)  . Tape Other (See Comments)    Removes skin.  . Trimethoprim     Thank you for allowing pharmacy to be a part of this patient's care.  Tobie Lords, PharmD, BCPS Clinical Pharmacist 10/30/2018 6:21 AM

## 2018-10-30 NOTE — Consult Note (Signed)
Cardiology Consultation:   Patient ID: William Carpenter MRN: [id number]; DOB: 1955-09-02  Admit date: 10/30/2018 Date of Consult: 10/30/2018  Primary Care Provider: Raelyn Mora, MD Primary Cardiologist: New - Equan Cogbill Primary Electrophysiologist:  None    Patient Profile:   William Carpenter is a 63 y.o. 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 Charna Archer.  History of Present Illness:   William Carpenter reports having cramping in his neck yesterday afternoon and evening prior to going to bed.  He woke up in the early morning hours with acute onset of left-sided shoulder and chest pain.  He describes it as though his chest were in a vice with 9/10 in intensity.  He has also experienced some shortness of breath.  He has never experienced pain like this before.  He presented to the emergency department and was found to have anterior ST segment elevation.  He was given aspirin, nitroglycerin, and IV heparin with minimal improvement in his pain.  However, his blood pressure dropped after sublingual nitroglycerin.  William Carpenter was reportedly treated for pneumonia not long ago.  He denies fevers or chills, though EMS reportedly noted a temperature of 102 F.  In the ED, William Carpenter was afebrile.  Given EKG findings and ongoing chest pain, William Carpenter was referred for emergent left heart catheterization possible PCI.  He was found to have severe multivessel CAD with culprit lesion involving the proximal and mid LAD.  This was successfully treated with 2 overlapping drug-eluting stents.  His chest pain has improved significantly and is now 1/2 in intensity.  He has complained of low back and bilateral shoulder pain throughout the catheterization.  His daughter notes that William Carpenter has been struggling with chronic pain for some  time.  It has been difficult to treat due to labile blood pressure and frequent episodes of hypotension at home.  Heart Pathway Score:     Past Medical History:  Diagnosis Date   Bullous dermatosis    Dialysis patient ([place])    Dysphagia    FSGS (focal segmental glomerulosclerosis)    Hyperlipidemia    Renal insufficiency    Spinal stenosis, cervical region     Past Surgical History:  Procedure Laterality Date   CARPAL TUNNEL RELEASE     SPINAL FUSION  2018   spleen  2015   removed     Home Medications:  Prior to Admission medications   Medication Sig Start Date Kweli Grassel Date Taking? Authorizing Provider  acetaminophen (TYLENOL) 325 MG tablet Take 650 mg by mouth every 8 (eight) hours as needed (pain or fever).     [provider]  amLODipine (NORVASC) 5 MG tablet Take 5 mg by mouth daily.     [provider]  atorvastatin (LIPITOR) 40 MG tablet Take 20 mg by mouth daily.     [provider]  b complex-vitamin c-folic acid (NEPHRO-VITE) 0.8 MG TABS tablet Take 1 tablet by mouth at bedtime.    [provider]  betamethasone dipropionate (DIPROLENE) 0.05 % ointment Apply topically 2 (two) times daily.    [provider]  bisacodyl (DULCOLAX) 10 MG suppository Place 10 mg rectally daily as needed for moderate constipation.    [provider]  calcium acetate (PHOSLO) 667 MG capsule Take 1,334 mg by mouth 3 (three) times daily with meals.  [provider]  Carboxymethylcellul-Glycerin (REFRESH OPTIVE SENSITIVE OP) Place 1 drop into both eyes every 4 (four) hours.    [provider]  carvedilol (COREG) 25 MG tablet Take 25 mg by mouth 2 (two) times daily with a meal.     [provider]  cetirizine (ZYRTEC) 10 MG tablet Take 10 mg by mouth daily.    [provider]  cinacalcet (SENSIPAR) 90 MG tablet Take 90 mg by mouth daily.    [provider]  clobetasol ointment (TEMOVATE) 9.37  % Apply 1 application topically 2 (two) times daily.    [provider]  docusate sodium (COLACE) 100 MG capsule Take 100 mg by mouth daily.    [provider]  dorzolamidel-timolol (COSOPT) 22.3-6.8 MG/ML SOLN ophthalmic solution 1 drop 2 (two) times daily.    [provider]  hydrALAZINE (APRESOLINE) 10 MG tablet Take 10 mg by mouth every 4 (four) hours as needed (systolic BP >169CVEL).     [provider]  hydrOXYzine (VISTARIL) 25 MG capsule Take 25 mg by mouth 3 (three) times daily as needed.    [provider]  ketoconazole (NIZORAL) 2 % shampoo Apply 1 application topically 2 (two) times a week.    [provider]  lactulose (CHRONULAC) 10 GM/15ML solution Take by mouth 3 (three) times daily.    [provider]  latanoprost (XALATAN) 0.005 % ophthalmic solution Place 1 drop into both eyes at bedtime.    [provider]  losartan (COZAAR) 50 MG tablet Take 50 mg by mouth daily. Take one Tuesday, Thursday, Saturday, Sunday    [provider]  Melatonin 5 MG CAPS Take by mouth.    [provider]  methocarbamol (ROBAXIN) 500 MG tablet Take 500 mg by mouth 2 (two) times daily.     [provider]  mirtazapine (REMERON) 15 MG tablet Take 15 mg by mouth at bedtime.    [provider]  mupirocin cream (BACTROBAN) 2 % Apply 1 application topically 2 (two) times daily. 09/30/16   Frederich Cha, MD  naloxone [facility]) nasal spray 4 mg/0.1 mL Place 1 spray into the nose.    [provider]  omeprazole (PRILOSEC) 20 MG capsule Take 20 mg by mouth 2 (two) times daily before a meal.    [provider]  oxycodone (OXY-IR) 5 MG capsule Take 5-10 mg by mouth every 4 (four) hours as needed for pain.     [provider]  polyvinyl alcohol (LIQUIFILM TEARS) 1.4 % ophthalmic solution Place 1 drop into both eyes 4 (four) times daily as needed for dry eyes.    [provider]   Psyllium (KONSYL-D PO) Take 5 mLs by mouth 2 (two) times daily.    [provider]  senna-docusate (SENOKOT-S) 8.6-50 MG tablet Take 1 tablet by mouth 2 (two) times daily as needed (to prevent constipation; scheduled until bowel movement).     [provider]  sertraline (ZOLOFT) 50 MG tablet Take 150 mg by mouth at bedtime.    [provider]  sevelamer carbonate (RENVELA) 0.8 g PACK packet Take 0.8 g by mouth 3 (three) times daily with meals.    [provider]  terbinafine (LAMISIL) 1 % cream Apply 1 application topically 2 (two) times daily.    [provider]    Inpatient Medications: Scheduled Meds:  aspirin  81 mg Oral Daily   atorvastatin  80 mg Oral Daily   calcium acetate  1,334 mg  Oral TID WC   Chlorhexidine Gluconate Cloth  6 each Topical Daily   cinacalcet  90 mg Oral Daily   dextrose  1 ampule Intravenous Once   diphenhydrAMINE  50 mg Intravenous Once   docusate sodium  100 mg Oral Daily   dorzolamidel-timolol  1 drop Both Eyes BID   heparin  5,000 Units Subcutaneous Q8H   latanoprost  1 drop Both Eyes QHS   loratadine  10 mg Oral Daily   methylPREDNISolone (SOLU-MEDROL) injection  125 mg Intravenous Once   mirtazapine  15 mg Oral QHS   multivitamin  1 tablet Oral QHS   pantoprazole  40 mg Oral Daily   sertraline  150 mg Oral QHS   sevelamer carbonate  0.8 g Oral TID WC   ticagrelor  90 mg Oral BID   Continuous Infusions:  sodium chloride 10 mL/hr at 10/30/18 0253   heparin     PRN Meds: acetaminophen, acetaminophen, hydrALAZINE, labetalol, Melatonin, methocarbamol, nitroGLYCERIN, nitroGLYCERIN, ondansetron (ZOFRAN) IV, oxycodone, senna-docusate  Allergies:    Allergies  Allergen Reactions   Albumin (Human) Hives   Aloe    Iodinated Diagnostic Agents    Iron Dextran    Metoprolol Tartrate    Penicillins Swelling   Quinine Sulfate [Quinine]    Rabeprazole    Sulfa Antibiotics Other  (See Comments)   Tape Other (See Comments)    Removes skin.   Trimethoprim     Social History:   Social History   Socioeconomic History   Marital status: Married    Spouse name: Not on file   Number of children: Not on file   Years of education: Not on file   Highest education level: Not on file  Occupational History   Not on file  Social Needs   Financial resource strain: Not on file   Food insecurity    Worry: Not on file    Inability: Not on file   Transportation needs    Medical: Not on file    Non-medical: Not on file  Tobacco Use   Smoking status: Never Smoker   Smokeless tobacco: Never Used  Substance and Sexual Activity   Alcohol use: No   Drug use: No   Sexual activity: Not on file  Lifestyle   Physical activity    Days per week: Not on file    Minutes per session: Not on file   Stress: Not on file  Relationships   Social connections    Talks on phone: Not on file    Gets together: Not on file    Attends religious service: Not on file    Active member of club or organization: Not on file    Attends meetings of clubs or organizations: Not on file    Relationship status: Not on file   Intimate partner violence    Fear of current or ex partner: Not on file    Emotionally abused: Not on file    Physically abused: Not on file    Forced sexual activity: Not on file  Other Topics Concern   Not on file  Social History Narrative   Not on file    Family History:   Family History  Problem Relation Age of Onset   Cancer Mother    COPD Mother    Heart disease Father    Parkinson's disease Father      ROS:  Review of Systems  Unable to perform ROS: Acuity of condition    Physical Exam/Data:  Vitals:   10/30/18 0232 10/30/18 0325 10/30/18 0515 10/30/18 0519  BP:   97/72   Pulse:   70   Resp:   19   Temp: 99.2 F (37.3 C)   98.3 F (36.8 C)  TempSrc: Rectal   Oral  SpO2:  98% (!) 88%   Weight:      Height:       No  intake or output data in the 24 hours ending 10/30/18 0528 Last 3 Weights 10/30/2018 11/23/2017 11/23/2017  Weight (lbs) 194 lb 0.1 oz 169 lb 15.6 oz 175 lb 7.8 oz  Weight (kg) 88 kg 77.1 kg 79.6 kg     Body mass index is 28.65 kg/m.  General: Mildly uncomfortable man lying in bed. HEENT: normal Lymph: no adenopathy Neck: no JVD Endocrine:  No thryomegaly Vascular: No carotid bruits; 1+ radial and femoral pulses bilaterally.  Right femoral artery sheath secured in place. Cardiac: Distant heart sounds.  Regular rate and rhythm without murmurs or rubs. Lungs: Mildly diminished breath sounds throughout without wheezes or crackles. Abd: soft, nontender, no hepatomegaly  Ext: Trace to 1+ pretibial edema bilaterally. Musculoskeletal:  No deformities, BUE and BLE strength normal and equal Skin: warm and dry  Neuro:  CNs 2-12 intact, no focal abnormalities noted Psych:  Normal affect   EKG:  The EKG (post PCI) was personally reviewed and demonstrates: Normal sinus rhythm with PACs, septal infarct, and nonspecific ST segment changes.  Anterior ST elevation noted on EKG from earlier today has improved.  Relevant CV Studies: LHC/PCI (10/30/2018): 1. Severe three-vessel coronary artery disease with heavy calcification, including sequential 70% and 99% proximal and mid LAD stenoses (culprit lesions), long segment of irregular 50% distal LAD stenosis, 50% ostial LCx stenosis, severe disease involving diagonal branches and distal portion of OM2, and focal 90% stenosis in the proximal rPDA. 2. Moderately to severely reduced left ventricular systolic function with mid and apical anterior, apical, and apical inferior hypokinesis/akinesis.  LVEF approximately 35%. 3. Mildly elevated left ventricular filling pressure. 4. Successful PCI to proximal and mid LAD using overlapping Synergy 3.5 x 16 mm and 3.0 x 28 mm drug-eluting stents with less than 10% residual stenosis and TIMI-3 flow.  Laboratory  Data:  High Sensitivity Troponin:   Recent Labs  Lab 10/30/18 0216  TROPONINIHS 132*     Chemistry Recent Labs  Lab 10/30/18 0216  NA 135  K 4.9  CL 94*  CO2 25  GLUCOSE 104*  BUN 60*  CREATININE 7.25*  CALCIUM 8.6*  GFRNONAA 7*  GFRAA 8*  ANIONGAP 16*    Recent Labs  Lab 10/30/18 0216  PROT 9.3*  ALBUMIN 3.8  AST 26  ALT 18  ALKPHOS 212*  BILITOT 1.7*   Hematology Recent Labs  Lab 10/30/18 0216 10/30/18 0510  WBC 12.7* 14.2*  RBC 3.94* 3.77*  HGB 13.5 12.8*  HCT 42.3 40.1  MCV 107.4* 106.4*  MCH 34.3* 34.0  MCHC 31.9 31.9  RDW 14.0 13.9  PLT 269 259   BNPNo results for input(s): BNP, PROBNP in the last 168 hours.  DDimer No results for input(s): DDIMER in the last 168 hours.   Radiology/Studies:  Dg Chest Port 1 View  Result Date: 10/30/2018 CLINICAL DATA:  63 year old male with fever. EXAM: PORTABLE CHEST 1 VIEW COMPARISON:  Chest radiograph dated 11/22/2017. FINDINGS: There is shallow inspiration with left lung base atelectasis or infiltrate. Left peripheral and subpleural streaky densities may represent atelectatic changes or developing infiltrate. There is an  increased density in the right hilar region which may represent confluence of prominent hilar vessels and related to underlying pulmonary hypertension. An infiltrate or mass is not excluded. Clinical correlation and follow-up to resolution recommended. There is atherosclerotic calcification of the aortic arch. Increased tissue density surrounding the aortic arch may represent atelectatic changes in the adjacent lung. If there is clinical concern for acute aortic pathology further evaluation with CT with IV contrast is recommended. No large pleural effusion or pneumothorax. Stable cardiomegaly. No acute osseous pathology. Cervical ACDF. IMPRESSION: 1. Left lung base streaky densities may represent atelectasis or infiltrate. 2. Increased density in the right hilar region may represent dilated hilar  vessels. An infiltrate or mass is not excluded. Clinical correlation and follow-up to resolution recommended. 3. Crescentic density adjacent to the aortic arch may represent atelectasis in the adjacent lung. If there is clinical concern for acute aortic pathology, further evaluation with CT with IV contrast is recommended. 4. Stable cardiomegaly. Electronically Signed   By: Anner Crete M.D.   On: 10/30/2018 02:44    Assessment and Plan:   Anterior STEMI: Patient's presentation is consistent with acute MI, likely due to plaque rupture and thrombus formation in the mid LAD.  The patient has severe multivessel CAD involving other territories as well.  He is status post primary PCI to the proximal and mid LAD with marked improvement in his chest pain.  Admit to ICU for post STEMI monitoring.  Trend troponin until it has peaked, then stop.  Obtain transthoracic echocardiogram.  Continue cangrelor for 2 hours from the time of ticagrelor administration at the Glendola Friedhoff of PCI.  Dual antiplatelet therapy with aspirin and ticagrelor for at least 12 months, ideally longer given severe multivessel CAD.  Aggressive secondary prevention including high intensity statin therapy.  Recommend medical therapy of distal LAD, diagonal, OM 2, and RPDA disease.  If the patient has refractory chest pain, PCI to RPDA could be considered.  Cardiac rehab after discharge.  Right femoral artery sheath to be removed with manual compression once ACT has fallen below 175 seconds.  Ischemic cardiomyopathy: LVEF moderately to severely reduced by echocardiogram.  LVEDP only mildly elevated at the Carita Sollars of the procedure.  Patient is on carvedilol, hydralazine, and losartan at home, though his daughter notes episodic hypotension.  Attain transthoracic echocardiogram.  Limit fluid administration, given reduced LVEF and ESRD.  Carvedilol, losartan, and hydralazine on hold in the setting of hypotension.  These should be  restarted gradually as blood pressure allows.  Gethsemane Fischler-stage renal disease: Only mildly elevated left ventricular filling pressure.  Avoid IV fluids.  Consult nephrology.  Hypotension: Patient has a history of hypertension, though his daughter reports episodic hypotension that has limited ability to treat his pain.  Hold all antihypertensive medications.  Judicious use of opioids for pain control (chronic musculoskeletal pain), if blood pressure allows.  If patient has worsening hypotension, low-dose norepinephrine will need to be considered.    Hyperlipidemia: LDL suboptimally controlled (goal less than 70).  Increase atorvastatin to 80 mg daily.  For questions or updates, please contact [place] Please consult [email or web address] for contact info under   Signed, Nelva Bush, MD  10/30/2018 5:28 AM

## 2018-10-30 NOTE — Progress Notes (Signed)
[facility], [place] 10/30/18  Subjective:   LOS: 0 No intake/output data recorded. Patient known to our practice from previous admissions He presented to the emergency room for complaints of chest pain and fever. Diagnosed with STEMI. Underwent emergent left heart catheterization with possible PCI.  Found to have severe multivessel coronary disease with culprit lesion involving proximal and mid LAD which was successfully treated with 2 overlapping drug-eluting stents.  Chest pain significantly improved after the procedure. Nephrology consult requested for evaluation of hemodialysis post cath  Objective:  Vital signs in last 24 hours:  Temp:  [98.2 F (36.8 C)-99.2 F (37.3 C)] 98.7 F (37.1 C) (10/26 0802) Pulse Rate:  [63-96] 64 (10/26 0920) Resp:  [12-24] 18 (10/26 0920) BP: (88-159)/(66-126) 103/78 (10/26 0920) SpO2:  [78 %-99 %] 95 % (10/26 0920) Weight:  [88 kg] 88 kg (10/26 0210)  Weight change:  Filed Weights   10/30/18 0210  Weight: 88 kg    Intake/Output:   No intake or output data in the 24 hours ending 10/30/18 1323   Physical Exam: General:  No acute distress, laying in the bed  HEENT  anicteric, moist oral mucous membranes  Pulm/lungs  supple, no masses  CVS/Heart  regular rhythm with ectopic beats  Abdomen:   Soft, nontender  Extremities:  Trace to 1+ edema  Neurologic:  Alert, oriented  Skin:  No acute rashes  Access:  Left forearm AV fistula       Basic Metabolic Panel:  Recent Labs  Lab 10/30/18 0216 10/30/18 0510  NA 135 136  K 4.9 4.8  CL 94* 97*  CO2 25 24  GLUCOSE 104* 88  BUN 60* 63*  CREATININE 7.25* 7.35*  CALCIUM 8.6* 8.1*     CBC: Recent Labs  Lab 10/30/18 0216 10/30/18 0510  WBC 12.7* 14.2*  NEUTROABS 7.8*  --   HGB 13.5 12.8*  HCT 42.3 40.1  MCV 107.4* 106.4*  PLT 269 259     No results found for: HEPBSAG, HEPBSAB, HEPBIGM    Microbiology:  Recent Results (from the past 240  hour(s))  Blood Culture (routine x 2)     Status: None (Preliminary result)   Collection Time: 10/30/18  2:16 AM   Specimen: BLOOD  Result Value Ref Range Status   Specimen Description BLOOD RIGHT FOREARM  Final   Special Requests   Final    BOTTLES DRAWN AEROBIC AND ANAEROBIC Blood Culture adequate volume   Culture   Final    NO GROWTH < 12 HOURS Performed at [facility], [address] [place], [place] [address]    Report Status PENDING  Incomplete  Blood Culture (routine x 2)     Status: None (Preliminary result)   Collection Time: 10/30/18  2:16 AM   Specimen: BLOOD  Result Value Ref Range Status   Specimen Description BLOOD RIGHT ANTECUBITAL  Final   Special Requests   Final    BOTTLES DRAWN AEROBIC AND ANAEROBIC Blood Culture adequate volume   Culture   Final    NO GROWTH < 12 HOURS Performed at [facility], [address] [place], [place] [address]    Report Status PENDING  Incomplete  SARS Coronavirus 2 by RT PCR (hospital order, performed in [place] hospital lab) Nasopharyngeal Nasopharyngeal Swab     Status: None   Collection Time: 10/30/18  2:16 AM   Specimen: Nasopharyngeal Swab  Result Value Ref Range Status   SARS Coronavirus 2 NEGATIVE NEGATIVE  Final    Comment: (NOTE) If result is NEGATIVE SARS-CoV-2 target nucleic acids are NOT DETECTED. The SARS-CoV-2 RNA is generally detectable in upper and lower  respiratory specimens during the acute phase of infection. The lowest  concentration of SARS-CoV-2 viral copies this assay can detect is 250  copies / mL. A negative result does not preclude SARS-CoV-2 infection  and should not be used as the sole basis for treatment or other  patient management decisions.  A negative result may occur with  improper specimen collection / handling, submission of specimen other  than nasopharyngeal swab, presence of viral mutation(s) within the  areas targeted by this assay, and inadequate number of viral  copies  (<250 copies / mL). A negative result must be combined with clinical  observations, patient history, and epidemiological information. If result is POSITIVE SARS-CoV-2 target nucleic acids are DETECTED. The SARS-CoV-2 RNA is generally detectable in upper and lower  respiratory specimens dur ing the acute phase of infection.  Positive  results are indicative of active infection with SARS-CoV-2.  Clinical  correlation with patient history and other diagnostic information is  necessary to determine patient infection status.  Positive results do  not rule out bacterial infection or co-infection with other viruses. If result is PRESUMPTIVE POSTIVE SARS-CoV-2 nucleic acids MAY BE PRESENT.   A presumptive positive result was obtained on the submitted specimen  and confirmed on repeat testing.  While 2019 novel coronavirus  (SARS-CoV-2) nucleic acids may be present in the submitted sample  additional confirmatory testing may be necessary for epidemiological  and / or clinical management purposes  to differentiate between  SARS-CoV-2 and other Sarbecovirus currently known to infect humans.  If clinically indicated additional testing with an alternate test  methodology [phone]) is advised. The SARS-CoV-2 RNA is generally  detectable in upper and lower respiratory sp ecimens during the acute  phase of infection. The expected result is Negative. Fact Sheet for Patients:  StrictlyIdeas.no Fact Sheet for Healthcare Providers: BankingDealers.co.za This test is not yet approved or cleared by the [place] FDA and has been authorized for detection and/or diagnosis of SARS-CoV-2 by FDA under an Emergency Use Authorization (EUA).  This EUA will remain in effect (meaning this test can be used) for the duration of the COVID-19 declaration under Section 564(b)(1) of the Act, 21 U.S.C. section 360bbb-3(b)(1), unless the authorization is terminated  or revoked sooner. Performed at [facility], [address] [place], [place] [address]   MRSA PCR Screening     Status: None   Collection Time: 10/30/18  5:45 AM   Specimen: Nasopharyngeal  Result Value Ref Range Status   MRSA by PCR NEGATIVE NEGATIVE Final    Comment:        The GeneXpert MRSA Assay (FDA approved for NASAL specimens only), is one component of a comprehensive MRSA colonization surveillance program. It is not intended to diagnose MRSA infection nor to guide or monitor treatment for MRSA infections. Performed at [facility], [address] [place], [place] [address]     Coagulation Studies: Recent Labs    10/30/18 0216  LABPROT 13.6  INR 1.1    Urinalysis: No results for input(s): COLORURINE, LABSPEC, PHURINE, GLUCOSEU, HGBUR, BILIRUBINUR, KETONESUR, PROTEINUR, UROBILINOGEN, NITRITE, LEUKOCYTESUR in the last 72 hours.  Invalid input(s): APPERANCEUR    Imaging: Dg Chest Port 1 View  Result Date: 10/30/2018 CLINICAL DATA:  63 year old male with fever. EXAM: PORTABLE CHEST 1 VIEW COMPARISON:  Chest radiograph dated 11/22/2017.  FINDINGS: There is shallow inspiration with left lung base atelectasis or infiltrate. Left peripheral and subpleural streaky densities may represent atelectatic changes or developing infiltrate. There is an increased density in the right hilar region which may represent confluence of prominent hilar vessels and related to underlying pulmonary hypertension. An infiltrate or mass is not excluded. Clinical correlation and follow-up to resolution recommended. There is atherosclerotic calcification of the aortic arch. Increased tissue density surrounding the aortic arch may represent atelectatic changes in the adjacent lung. If there is clinical concern for acute aortic pathology further evaluation with CT with IV contrast is recommended. No large pleural effusion or pneumothorax. Stable cardiomegaly. No acute osseous  pathology. Cervical ACDF. IMPRESSION: 1. Left lung base streaky densities may represent atelectasis or infiltrate. 2. Increased density in the right hilar region may represent dilated hilar vessels. An infiltrate or mass is not excluded. Clinical correlation and follow-up to resolution recommended. 3. Crescentic density adjacent to the aortic arch may represent atelectasis in the adjacent lung. If there is clinical concern for acute aortic pathology, further evaluation with CT with IV contrast is recommended. 4. Stable cardiomegaly. Electronically Signed   By: Anner Crete M.D.   On: 10/30/2018 02:44     Medications:   . sodium chloride 10 mL/hr at 10/30/18 0253  . heparin     . aspirin  81 mg Oral Daily  . atorvastatin  80 mg Oral Daily  . calcium acetate  1,334 mg Oral TID WC  . Chlorhexidine Gluconate Cloth  6 each Topical Daily  . cinacalcet  90 mg Oral Q breakfast  . dextrose      . diphenhydrAMINE  50 mg Intravenous Once  . docusate sodium  100 mg Oral Daily  . dorzolamide-timolol  1 drop Both Eyes BID  . heparin  5,000 Units Subcutaneous Q8H  . latanoprost  1 drop Both Eyes QHS  . loratadine  10 mg Oral Daily  . methylPREDNISolone (SOLU-MEDROL) injection  125 mg Intravenous Once  . mirtazapine  15 mg Oral QHS  . multivitamin  1 tablet Oral QHS  . pantoprazole  40 mg Oral Daily  . sertraline  150 mg Oral QHS  . sevelamer carbonate  0.8 g Oral TID WC  . ticagrelor  90 mg Oral BID   acetaminophen, acetaminophen, Melatonin, methocarbamol, morphine injection, nitroGLYCERIN, ondansetron (ZOFRAN) IV, oxyCODONE, senna-docusate  Assessment/ Plan:  63 y.o. male with ESRD, dialysis for 28 years, during the patient, recent cervical decompression, autoimmune skin disease admitted for:  Principal Problem:   STEMI (ST elevation myocardial infarction) ([place]) Active Problems:   STEMI involving left anterior descending coronary artery ([place])  [facility] [place]/88 kg/ MWF/ 3.45 min.   #.  ESRD Patient is critically ill with recent STEMI s/p PCI.  He is being monitored in the ICU.  He is currently not on any pressors.  No chest pain at this time.  Blood pressure low normal.  Patient requesting midodrine for low blood pressure.  We will arrange care for hemodialysis today with gentle volume removal. UF goal 1.5 kg as tolerated   #. Anemia of CKD  Lab Results  Component Value Date   HGB 12.8 (L) 10/30/2018  Hold Epogen because hemoglobin above goal  #. SHPTH  No results found for: PTH Lab Results  Component Value Date   PHOS 4.7 (H) 11/25/2017  Monitor phosphorus during hospitalization  # STEMI Underwent emergent PCI 10/26.  Severe multivessel coronary disease with culprit lesion involving proximal and mid LAD  treated with 2 drug-eluting stents.  Currently being monitored in the ICU    LOS: 0 William Carpenter 10/26/20201:23 PM  [place], [place]

## 2018-10-30 NOTE — Progress Notes (Signed)
*  PRELIMINARY RESULTS* Echocardiogram 2D Echocardiogram has been performed.  William Carpenter 10/30/2018, 9:15 AM

## 2018-10-30 NOTE — Progress Notes (Signed)
MEDICATION RELATED CONSULT NOTE - INITIAL   Pharmacy Consult for Cangrelor Indication: STEMI s/p post-cath  Allergies  Allergen Reactions  . Albumin (Human) Hives  . Aloe   . Iodinated Diagnostic Agents   . Iron Dextran   . Metoprolol Tartrate   . Penicillins Swelling  . Quinine Sulfate [Quinine]   . Rabeprazole   . Sulfa Antibiotics Other (See Comments)  . Tape Other (See Comments)    Removes skin.  . Trimethoprim     Patient Measurements: Height: 5\' 9"  (175.3 cm) Weight: 194 lb 0.1 oz (88 kg) IBW/kg (Calculated) : 70.7  Vital Signs: Temp: 98.3 F (36.8 C) (10/26 0519) Temp Source: Oral (10/26 0519) BP: 97/72 (10/26 0515) Pulse Rate: 70 (10/26 0515) Intake/Output from previous day: No intake/output data recorded. Intake/Output from this shift: No intake/output data recorded.  Labs: Recent Labs    10/30/18 0216 10/30/18 0510  WBC 12.7* 14.2*  HGB 13.5 12.8*  HCT 42.3 40.1  PLT 269 259  APTT 34  --   CREATININE 7.25*  --   ALBUMIN 3.8  --   PROT 9.3*  --   AST 26  --   ALT 18  --   ALKPHOS 212*  --   BILITOT 1.7*  --    Estimated Creatinine Clearance: 11.4 mL/min (A) (by C-G formula based on SCr of 7.25 mg/dL (H)).   Microbiology: Recent Results (from the past 720 hour(s))  SARS Coronavirus 2 by RT PCR (hospital order, performed in [facility] hospital lab) Nasopharyngeal Nasopharyngeal Swab     Status: None   Collection Time: 10/30/18  2:16 AM   Specimen: Nasopharyngeal Swab  Result Value Ref Range Status   SARS Coronavirus 2 NEGATIVE NEGATIVE Final    Comment: (NOTE) If result is NEGATIVE SARS-CoV-2 target nucleic acids are NOT DETECTED. The SARS-CoV-2 RNA is generally detectable in upper and lower  respiratory specimens during the acute phase of infection. The lowest  concentration of SARS-CoV-2 viral copies this assay can detect is 250  copies / mL. A negative result does not preclude SARS-CoV-2 infection  and should not be used as the sole  basis for treatment or other  patient management decisions.  A negative result may occur with  improper specimen collection / handling, submission of specimen other  than nasopharyngeal swab, presence of viral mutation(s) within the  areas targeted by this assay, and inadequate number of viral copies  (<250 copies / mL). A negative result must be combined with clinical  observations, patient history, and epidemiological information. If result is POSITIVE SARS-CoV-2 target nucleic acids are DETECTED. The SARS-CoV-2 RNA is generally detectable in upper and lower  respiratory specimens dur ing the acute phase of infection.  Positive  results are indicative of active infection with SARS-CoV-2.  Clinical  correlation with patient history and other diagnostic information is  necessary to determine patient infection status.  Positive results do  not rule out bacterial infection or co-infection with other viruses. If result is PRESUMPTIVE POSTIVE SARS-CoV-2 nucleic acids MAY BE PRESENT.   A presumptive positive result was obtained on the submitted specimen  and confirmed on repeat testing.  While 2019 novel coronavirus  (SARS-CoV-2) nucleic acids may be present in the submitted sample  additional confirmatory testing may be necessary for epidemiological  and / or clinical management purposes  to differentiate between  SARS-CoV-2 and other Sarbecovirus currently known to infect humans.  If clinically indicated additional testing with an alternate test  methodology [phone]) is  advised. The SARS-CoV-2 RNA is generally  detectable in upper and lower respiratory sp ecimens during the acute  phase of infection. The expected result is Negative. Fact Sheet for Patients:  StrictlyIdeas.no Fact Sheet for Healthcare Providers: BankingDealers.co.za This test is not yet approved or cleared by the [place] FDA and has been authorized for detection  and/or diagnosis of SARS-CoV-2 by FDA under an Emergency Use Authorization (EUA).  This EUA will remain in effect (meaning this test can be used) for the duration of the COVID-19 declaration under Section 564(b)(1) of the Act, 21 U.S.C. section 360bbb-3(b)(1), unless the authorization is terminated or revoked sooner. Performed at [facility], [address], [place], [place] [address]     Medical History: Past Medical History:  Diagnosis Date  . Bullous dermatosis   . Dialysis patient ([place])   . Dysphagia   . FSGS (focal segmental glomerulosclerosis)   . Hyperlipidemia   . Renal insufficiency   . Spinal stenosis, cervical region     Medications:  Scheduled:  . aspirin  81 mg Oral Daily  . atorvastatin  80 mg Oral Daily  . calcium acetate  1,334 mg Oral TID WC  . Chlorhexidine Gluconate Cloth  6 each Topical Daily  . cinacalcet  90 mg Oral Daily  . dextrose  1 ampule Intravenous Once  . diphenhydrAMINE  50 mg Intravenous Once  . docusate sodium  100 mg Oral Daily  . dorzolamidel-timolol  1 drop Both Eyes BID  . heparin  5,000 Units Subcutaneous Q8H  . latanoprost  1 drop Both Eyes QHS  . loratadine  10 mg Oral Daily  . methylPREDNISolone (SOLU-MEDROL) injection  125 mg Intravenous Once  . mirtazapine  15 mg Oral QHS  . multivitamin  1 tablet Oral QHS  . pantoprazole  40 mg Oral Daily  . sertraline  150 mg Oral QHS  . sevelamer carbonate  0.8 g Oral TID WC  . ticagrelor  90 mg Oral BID    Assessment: Patient arrives to ED w/ CP w/ onset of severe left-sided CP that woke him from sleep w/ trop of 132 and EKG showing minimal ST elevation in inferior leads, patient was taken to cath for PCI and was started on cangrelor.  Goal of Therapy:  Resolution of chest pain and normalization of troponin  Plan:  Patient received cangrelor 30 mcg/kg IV bolus x 1  4 mcg/kg/min infusion started in cath. Ticagrelor 180 mg po load given at 0450 in cath. Cardiology would  like cangrelor to run for 2 hours post giving ticagrelor load. Cangrelor order 4 mcg/kg/min placed and stop time entered for 0729; however, spoke to [facility] and let her know to stop cangrelor at 0650 since that will be 2 hours post ticagrelor administration.  Tobie Lords, PharmD, BCPS Clinical Pharmacist 10/30/2018,5:30 AM

## 2018-10-30 NOTE — Progress Notes (Signed)
HD Tx started w/o complications    16/10/96 1415  Vital Signs  Pulse Rate (!) 56  Pulse Rate Source Monitor  Resp 19  BP 110/72  BP Location Right Arm  BP Method Automatic  Patient Position (if appropriate) Lying  Oxygen Therapy  SpO2 92 %  O2 Device Room Air  During Hemodialysis Assessment  Blood Flow Rate (mL/min) 400 mL/min  Arterial Pressure (mmHg) -200 mmHg  Venous Pressure (mmHg) 210 mmHg  Transmembrane Pressure (mmHg) 600 mmHg  Ultrafiltration Rate (mL/min) 660 mL/min  Dialysate Flow Rate (mL/min) 600 ml/min  Conductivity: Machine  13.5  HD Safety Checks Performed Yes  Dialysis Fluid Bolus Normal Saline  Bolus Amount (mL) 250 mL  Intra-Hemodialysis Comments Tx initiated  Fistula / Graft Left Forearm Arteriovenous fistula  No Placement Date or Time found.   Placed prior to admission: Yes  Orientation: Left  Access Location: Forearm  Access Type: Arteriovenous fistula  Site Condition No complications  Fistula / Graft Assessment Present;Thrill;Bruit  Status Patent  Needle Size 15g  Drainage Description None

## 2018-10-31 ENCOUNTER — Encounter: Payer: Self-pay | Admitting: Radiology

## 2018-10-31 ENCOUNTER — Inpatient Hospital Stay: Payer: Medicare Other

## 2018-10-31 DIAGNOSIS — I2102 ST elevation (STEMI) myocardial infarction involving left anterior descending coronary artery: Secondary | ICD-10-CM | POA: Diagnosis not present

## 2018-10-31 DIAGNOSIS — E785 Hyperlipidemia, unspecified: Secondary | ICD-10-CM | POA: Diagnosis not present

## 2018-10-31 DIAGNOSIS — I255 Ischemic cardiomyopathy: Secondary | ICD-10-CM | POA: Diagnosis not present

## 2018-10-31 DIAGNOSIS — N186 End stage renal disease: Secondary | ICD-10-CM | POA: Diagnosis not present

## 2018-10-31 LAB — TROPONIN I (HIGH SENSITIVITY)
Troponin I (High Sensitivity): 435 ng/L (ref <18)
Troponin I (High Sensitivity): 459 ng/L (ref <18)
Troponin I (High Sensitivity): 442 ng/L (ref <18)

## 2018-10-31 LAB — CBC
HCT: 36.8 % — ABNORMAL LOW (ref 39.0–52.0)
Hemoglobin: 12.1 g/dL — ABNORMAL LOW (ref 13.0–17.0)
MCH: 34.2 pg — ABNORMAL HIGH (ref 26.0–34.0)
MCHC: 32.9 g/dL (ref 30.0–36.0)
MCV: 104 fL — ABNORMAL HIGH (ref 80.0–100.0)
Platelets: 245 10*3/uL (ref 150–400)
RBC: 3.54 MIL/uL — ABNORMAL LOW (ref 4.22–5.81)
RDW: 14.2 % (ref 11.5–15.5)
WBC: 11.1 10*3/uL — ABNORMAL HIGH (ref 4.0–10.5)
nRBC: 0 % (ref 0.0–0.2)

## 2018-10-31 LAB — RENAL FUNCTION PANEL
Albumin: 3.2 g/dL — ABNORMAL LOW (ref 3.5–5.0)
Anion gap: 14 (ref 5–15)
BUN: 42 mg/dL — ABNORMAL HIGH (ref 8–23)
CO2: 26 mmol/L (ref 22–32)
Calcium: 8.3 mg/dL — ABNORMAL LOW (ref 8.9–10.3)
Chloride: 96 mmol/L — ABNORMAL LOW (ref 98–111)
Creatinine, Ser: 5.72 mg/dL — ABNORMAL HIGH (ref 0.61–1.24)
GFR calc Af Amer: 11 mL/min — ABNORMAL LOW (ref >60)
GFR calc non Af Amer: 10 mL/min — ABNORMAL LOW (ref >60)
Glucose, Bld: 89 mg/dL (ref 70–99)
Phosphorus: 2.8 mg/dL (ref 2.5–4.6)
Potassium: 4.4 mmol/L (ref 3.5–5.1)
Sodium: 136 mmol/L (ref 135–145)

## 2018-10-31 LAB — APTT: aPTT: 36 seconds (ref 24–36)

## 2018-10-31 LAB — LACTIC ACID, PLASMA: Lactic Acid, Venous: 1.5 mmol/L (ref 0.5–1.9)

## 2018-10-31 LAB — PROTIME-INR
INR: 1.2 (ref 0.8–1.2)
INR: 1.1 (ref 0.8–1.2)
Prothrombin Time: 15.1 seconds (ref 11.4–15.2)
Prothrombin Time: 14.2 seconds (ref 11.4–15.2)

## 2018-10-31 LAB — LIPID PANEL
Cholesterol: 158 mg/dL (ref 0–200)
HDL: 44 mg/dL (ref >40)
LDL Cholesterol: 90 mg/dL (ref 0–99)
Total CHOL/HDL Ratio: 3.6 RATIO
Triglycerides: 119 mg/dL (ref <150)
VLDL: 24 mg/dL (ref 0–40)

## 2018-10-31 LAB — HEPATITIS B SURFACE ANTIBODY, QUANTITATIVE: Hep B S AB Quant (Post): >1000 m[IU]/mL (ref >9.9)

## 2018-10-31 MED ORDER — DIPHENHYDRAMINE HCL 50 MG/ML IJ SOLN
25.0000 mg | Freq: Once | INTRAMUSCULAR | Status: AC
  Administered 2018-11-01: 25 mg via INTRAVENOUS
  Filled 2018-10-31: qty 1

## 2018-10-31 MED ORDER — DILTIAZEM HCL 25 MG/5ML IV SOLN
5.0000 mg | Freq: Once | INTRAVENOUS | Status: AC
  Administered 2018-10-31: 5 mg via INTRAVENOUS
  Filled 2018-10-31: qty 5

## 2018-10-31 MED ORDER — AMIODARONE HCL IN DEXTROSE 360-4.14 MG/200ML-% IV SOLN
60.0000 mg/h | INTRAVENOUS | Status: AC
  Administered 2018-11-01: 60 mg/h via INTRAVENOUS
  Filled 2018-10-31: qty 200

## 2018-10-31 MED ORDER — IOHEXOL 350 MG/ML SOLN
100.0000 mL | Freq: Once | INTRAVENOUS | Status: AC | PRN
  Administered 2018-10-31: 100 mL via INTRAVENOUS

## 2018-10-31 MED ORDER — SODIUM CHLORIDE 0.9 % IV SOLN: 250.0000 mL | INTRAVENOUS | Status: DC | PRN

## 2018-10-31 MED ORDER — NITROGLYCERIN IN D5W 200-5 MCG/ML-% IV SOLN
0.0000 ug/min | INTRAVENOUS | Status: DC
  Administered 2018-10-31: 50 ug/min via INTRAVENOUS
  Filled 2018-10-31: qty 250

## 2018-10-31 MED ORDER — DIPHENHYDRAMINE HCL 50 MG/ML IJ SOLN
50.0000 mg | Freq: Once | INTRAMUSCULAR | Status: AC
  Administered 2018-10-31: 50 mg via INTRAVENOUS
  Filled 2018-10-31: qty 1

## 2018-10-31 MED ORDER — DIPHENHYDRAMINE HCL 50 MG PO CAPS
50.0000 mg | ORAL_CAPSULE | Freq: Once | ORAL | Status: AC
  Administered 2018-11-01: 50 mg via ORAL
  Filled 2018-10-31: qty 1

## 2018-10-31 MED ORDER — METOPROLOL TARTRATE 5 MG/5ML IV SOLN
2.5000 mg | Freq: Once | INTRAVENOUS | Status: AC
  Administered 2018-10-31: 2.5 mg via INTRAVENOUS
  Filled 2018-10-31: qty 5

## 2018-10-31 MED ORDER — HEPARIN BOLUS VIA INFUSION
4000.0000 [IU] | Freq: Once | INTRAVENOUS | Status: AC
  Administered 2018-10-31: 4000 [IU] via INTRAVENOUS
  Filled 2018-10-31: qty 4000

## 2018-10-31 MED ORDER — PREDNISONE 50 MG PO TABS
50.0000 mg | ORAL_TABLET | Freq: Four times a day (QID) | ORAL | Status: DC
  Filled 2018-10-31 (×2): qty 1

## 2018-10-31 MED ORDER — NEPRO/CARBSTEADY PO LIQD
237.0000 mL | Freq: Two times a day (BID) | ORAL | Status: DC
  Administered 2018-10-31 – 2018-11-06 (×5): 237 mL via ORAL

## 2018-10-31 MED ORDER — SODIUM CHLORIDE 0.9% FLUSH
3.0000 mL | Freq: Two times a day (BID) | INTRAVENOUS | Status: DC
  Administered 2018-10-31: 3 mL via INTRAVENOUS

## 2018-10-31 MED ORDER — CARVEDILOL 3.125 MG PO TABS
3.1250 mg | ORAL_TABLET | Freq: Two times a day (BID) | ORAL | Status: DC
  Administered 2018-10-31 – 2018-11-02 (×4): 3.125 mg via ORAL
  Filled 2018-10-31 (×4): qty 1

## 2018-10-31 MED ORDER — SODIUM CHLORIDE 0.9 % IV BOLUS
250.0000 mL | Freq: Once | INTRAVENOUS | Status: AC
  Administered 2018-11-01: 250 mL via INTRAVENOUS

## 2018-10-31 MED ORDER — SODIUM CHLORIDE 0.9% FLUSH: 3.0000 mL | INTRAVENOUS | Status: DC | PRN

## 2018-10-31 MED ORDER — DIPHENHYDRAMINE HCL 50 MG PO CAPS
50.0000 mg | ORAL_CAPSULE | Freq: Once | ORAL | Status: AC
  Filled 2018-10-31: qty 1

## 2018-10-31 MED ORDER — DIPHENHYDRAMINE HCL 50 MG/ML IJ SOLN
50.0000 mg | Freq: Once | INTRAMUSCULAR | Status: AC
  Filled 2018-10-31: qty 1

## 2018-10-31 MED ORDER — HEPARIN (PORCINE) 25000 UT/250ML-% IV SOLN
1000.0000 [IU]/h | INTRAVENOUS | Status: DC
  Administered 2018-10-31: 1000 [IU]/h via INTRAVENOUS
  Filled 2018-10-31: qty 250

## 2018-10-31 MED ORDER — SODIUM CHLORIDE 0.9 % IV SOLN: INTRAVENOUS | Status: DC

## 2018-10-31 MED ORDER — PREDNISONE 10 MG PO TABS
50.0000 mg | ORAL_TABLET | Freq: Four times a day (QID) | ORAL | Status: AC
  Administered 2018-10-31 – 2018-11-01 (×3): 50 mg via ORAL
  Filled 2018-10-31 (×2): qty 5

## 2018-10-31 MED ORDER — AMIODARONE HCL IN DEXTROSE 360-4.14 MG/200ML-% IV SOLN
30.0000 mg/h | INTRAVENOUS | Status: DC
  Administered 2018-11-01 (×2): 30 mg/h via INTRAVENOUS
  Filled 2018-10-31 (×2): qty 200

## 2018-10-31 NOTE — Progress Notes (Signed)
[facility] [place] NAME: William Carpenter    MR#:  [id number]  DATE OF BIRTH:  1955/09/19  SUBJECTIVE:   Patient states he is still having a little bit of chest pain this morning.  He says that his chest pain is much improved from when he first came in.  He denies any shortness of breath.  REVIEW OF SYSTEMS:  Review of Systems  Constitutional: Negative for chills and fever.  HENT: Negative for congestion and sore throat.   Eyes: Negative for blurred vision and double vision.  Respiratory: Negative for cough and shortness of breath.   Cardiovascular: Positive for chest pain. Negative for palpitations and leg swelling.  Gastrointestinal: Negative for nausea and vomiting.  Genitourinary: Negative for dysuria and urgency.  Musculoskeletal: Negative for back pain and neck pain.  Neurological: Negative for dizziness and headaches.  Psychiatric/Behavioral: Negative for depression. The patient is not nervous/anxious.     DRUG ALLERGIES:   Allergies  Allergen Reactions   Albumin (Human) Hives   Aloe    Erythromycin Hives   Iodinated Diagnostic Agents Hives   Iron Dextran    Metoprolol Tartrate    Penicillins Swelling   Quinine Sulfate [Quinine]    Rabeprazole    Sulfa Antibiotics Other (See Comments)   Tape Other (See Comments)    Removes skin.   Trimethoprim    VITALS:  Blood pressure (!) 132/91, pulse 69, temperature 98.1 F (36.7 C), temperature source Axillary, resp. rate (!) 29, height 5\' 9"  (1.753 m), weight 86.5 kg, SpO2 97 %. PHYSICAL EXAMINATION:  Physical Exam  GENERAL:  Laying in the bed with no acute distress.  HEENT: Head atraumatic, normocephalic. Pupils equal, round, reactive to light and accommodation. No scleral icterus. Extraocular muscles intact. Oropharynx and nasopharynx clear.  NECK:  Supple, no jugular venous distention. No thyroid enlargement. LUNGS: Lungs are clear to auscultation  bilaterally. No wheezes, crackles, rhonchi. No use of accessory muscles of respiration.  CARDIOVASCULAR: RRR, S1, S2 normal. No murmurs, rubs, or gallops.  ABDOMEN: Soft, nontender, nondistended. Bowel sounds present.  EXTREMITIES: No pedal edema, cyanosis, or clubbing.  NEUROLOGIC: CN 2-12 intact, no focal deficits. 5/5 muscle strength throughout all extremities. Sensation intact throughout. Gait not checked.  PSYCHIATRIC: The patient is alert and oriented x 3.  SKIN: No obvious rash, lesion, or ulcer.  LABORATORY PANEL:  Male CBC Recent Labs  Lab 10/31/18 0440  WBC 11.1*  HGB 12.1*  HCT 36.8*  PLT 245   ------------------------------------------------------------------------------------------------------------------ Chemistries  Recent Labs  Lab 10/30/18 0216  10/31/18 0440  NA 135   < > 136  K 4.9   < > 4.4  CL 94*   < > 96*  CO2 25   < > 26  GLUCOSE 104*   < > 89  BUN 60*   < > 42*  CREATININE 7.25*   < > 5.72*  CALCIUM 8.6*   < > 8.3*  AST 26  --   --   ALT 18  --   --   ALKPHOS 212*  --   --   BILITOT 1.7*  --   --    < > = values in this interval not displayed.   RADIOLOGY:  Ct Angio Chest/abd/pel For Dissection W And/or W/wo  Result Date: 10/31/2018 CLINICAL DATA:  63 year old male with history of acute onset of chest and back pain. Suspected aortic dissection. EXAM: CT ANGIOGRAPHY CHEST, ABDOMEN AND PELVIS TECHNIQUE: Multidetector CT  imaging through the chest, abdomen and pelvis was performed using the standard protocol during bolus administration of intravenous contrast. Multiplanar reconstructed images and MIPs were obtained and reviewed to evaluate the vascular anatomy. CONTRAST:  164mL OMNIPAQUE IOHEXOL 350 MG/ML SOLN COMPARISON:  CT the chest, abdomen and pelvis 06/26/2016. FINDINGS: CTA CHEST FINDINGS Cardiovascular: There is no crescentic high attenuation associated with the wall of the thoracic aorta on precontrast images to suggest acute intramural  hematoma. No evidence of thoracic aortic aneurysm or dissection. Ascending thoracic aorta aortic arch and descending thoracic aorta measure 3.6 cm, 3.0 cm and 2.5 cm in diameter respectively. 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 There is no significant pericardial fluid, thickening or pericardial calcification. There is aortic atherosclerosis, as well as atherosclerosis of the great vessels of the mediastinum and the coronary arteries, including calcified atherosclerotic plaque in the left main, left anterior descending, left circumflex and right coronary arteries. Calcifications of the aortic valve. Dilatation of the pulmonic trunk (3.8 cm in diameter). Mediastinum/Nodes: No pathologically enlarged mediastinal or hilar lymph nodes. Esophagus is unremarkable in appearance. No axillary lymphadenopathy. Lungs/Pleura: Small calcified granuloma in the apex of the left upper lobe. 5 mm right upper lobe pulmonary nodule (axial image 78 of series 6), stable compared to the prior study, considered definitively benign. No other suspicious appearing pulmonary nodules or masses are noted. Linear scarring in the left lower lobe, similar to the prior study. Trace left pleural effusion, new compared to the prior study. No acute consolidative airspace disease. Musculoskeletal: Diffuse sclerosis throughout the visualized axial and appendicular skeleton, potentially related to hyperparathyroidism in this patient with history of end stage renal disease. Review of the MIP images confirms the above findings. CTA ABDOMEN AND PELVIS FINDINGS VASCULAR Aorta: Normal caliber aorta without aneurysm, dissection, vasculitis or significant stenosis. Celiac: Patent without evidence of aneurysm, dissection, vasculitis or significant stenosis. SMA: Patent without  evidence of aneurysm, dissection, vasculitis or significant stenosis. Renals: Right renal artery is patent without evidence of aneurysm, dissection, vasculitis, fibromuscular dysplasia or significant stenosis. IMA: Patent without evidence of aneurysm, dissection, vasculitis or significant stenosis. Inflow: Patent without evidence of aneurysm, dissection, vasculitis or significant stenosis. Veins: No obvious venous abnormality within the limitations of this arterial phase study. Review of the MIP images confirms the above findings. NON-VASCULAR Hepatobiliary: No suspicious cystic or solid hepatic lesions are confidently identified on today's arterial phase examination. High attenuation lying dependently in the gallbladder, which presumably represents biliary sludge lying dependently. No surrounding inflammatory changes to suggest an acute cholecystitis at this time. Pancreas: No pancreatic mass. No pancreatic ductal dilatation. No pancreatic or peripancreatic fluid collections or inflammatory changes. Spleen: Status post splenectomy. Adrenals/Urinary Tract: Status post left nephrectomy. Right native kidney is severely atrophic with innumerable small low-attenuation lesions throughout the kidney, presumably cysts related to cystic disease of dialysis. 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 No hydroureteronephrosis. Urinary bladder is completely decompressed. Right adrenal gland is normal in appearance. Left adrenal gland is not confidently identified may be surgically absent. Stomach/Bowel: Normal appearance of the stomach. No pathologic dilatation of small bowel or colon. Numerous colonic diverticulae are noted, with some subtle surrounding inflammatory changes, which could be indicative of an acute diverticulitis, but are more likely related to mild edema in this patient with history of renal failure.  The appendix is not confidently identified and may be  surgically absent. Regardless, there are no inflammatory changes noted adjacent to the cecum to suggest the presence of an acute appendicitis at this time. Lymphatic: Multiple prominent borderline enlarged lymph nodes are noted throughout the pelvis measuring up to 9 mm in short axis, nonspecific. No pathologically enlarged lymph nodes identified in the abdomen or pelvis. Reproductive: Prostate gland and seminal vesicles are unremarkable in appearance. Other: Trace volume of ascites.  No pneumoperitoneum. Musculoskeletal: Diffuse sclerosis throughout the visualized axial and appendicular skeleton, favored to be related to hyperparathyroidism in this patient with history of end-stage renal disease. Status post laminectomy from L3-L5. Review of the MIP images confirms the above findings. IMPRESSION: 1. No evidence of aortic dissection or acute aortic syndrome. 2. Trace left pleural effusion. 3. Aortic atherosclerosis, in addition to left main and 3 vessel coronary artery disease. Please note that although the presence of coronary artery calcium documents the presence of coronary artery disease, the severity of this disease and any potential stenosis cannot be assessed on this non-gated CT examination. Assessment for potential risk factor modification, dietary therapy or pharmacologic therapy may be warranted, if clinically indicated. 4. Dilatation of the pulmonic trunk (3.8 cm in diameter), concerning for pulmonary arterial hypertension. 5. There are calcifications of the aortic valve. Echocardiographic correlation for evaluation of potential valvular dysfunction may be warranted if clinically indicated. 6. Colonic diverticulosis without definitive findings to suggest an acute diverticulitis at this time. There are subtle areas of stranding adjacent to the sigmoid colon which are favored to be related to a background state of mild soft tissue edema in this patient with history of end stage renal disease. 7. Additional  incidental findings, as above. Electronically Signed   By: Vinnie Langton M.D.   On: 10/31/2018 14:34   ASSESSMENT AND PLAN:   Acute STEMI- chest pain has improved -s/p cardiac catheterization on 10/26 with DES to the proximal and mid LAD -Cardiology following -Continue dual antiplatelet therapy with aspirin and Brilinta for 12 months -Will obtain CTA chest to rule out dissection, given his ongoing chest pain -ECHO with EF 50-55% -Continue Coreg and Lipitor -No ACE/ARB due to hypotension  Sepsis-initially meeting sepsis criteria on admission with fever and leukocytosis.  Sepsis has been ruled out with negative cultures. -Off antibiotics  ESRD on HD MWF -Nephrology following  Hyperlipidemia- LDL is 90 this admission -Continue home Lipitor  Hypertension-patient did have some low BPs earlier this admission, likely due to cardiogenic shock. -Continue midodrine -Started on low dose coreg per cards -Holding home hydralazine and losartan  All the records are reviewed and case discussed with Care Management/Social Worker. Management plans discussed with the patient, family and they are in agreement.  CODE STATUS: Full Code  TOTAL TIME TAKING CARE OF THIS PATIENT: 40 minutes.   More than 50% of the time was spent in counseling/coordination of care: YES  POSSIBLE D/C IN 1-2 DAYS, DEPENDING ON CLINICAL CONDITION.   Berna Spare Delmar Arriaga M.D on 10/31/2018 at 4:12 PM  Between 7am to 6pm - Pager [phone]  After 6pm go to [email or web address] - Proofreader  [facility]  Hospitalists  Office  [phone]  CC: Primary care physician; Raelyn Mora, MD  Note: This dictation was prepared with Dragon dictation along with smaller phrase technology. Any transcriptional errors that result from this process are unintentional.

## 2018-10-31 NOTE — Progress Notes (Signed)
[facility], [place] 10/31/18  Subjective:   LOS: 1   Intake/output 10/26 0701 - 10/27 0700 In: 120 [P.O.:120] Out: 1505   Patient known to our practice from previous admissions He presented to the emergency room for complaints of chest pain and fever. Diagnosed with STEMI. Underwent emergent left heart catheterization and Found to have severe multivessel coronary disease with culprit lesion involving proximal and mid LAD which was successfully treated with 2 overlapping drug-eluting stents.  Chest pain significantly improved after the procedure.  Underwent successful HD on Monday. 1500 cc removed Was able to eat 50% of his breakfast   Some oozing from dialysis cannulation site. Dry gauze bandage applied.  Objective:  Vital signs in last 24 hours:  Temp:  [97.5 F (36.4 C)-98.7 F (37.1 C)] 98.1 F (36.7 C) (10/27 0845) Pulse Rate:  [51-86] 73 (10/27 0845) Resp:  [12-27] 21 (10/27 0845) BP: (80-142)/(56-94) 120/92 (10/27 0800) SpO2:  [85 %-98 %] 97 % (10/27 0845) Weight:  [86.5 kg-88 kg] 86.5 kg (10/26 1750)  Weight change: 0 kg Filed Weights   10/30/18 0210 10/30/18 1400 10/30/18 1750  Weight: 88 kg 88 kg 86.5 kg    Intake/Output:    Intake/Output Summary (Last 24 hours) at 10/31/2018 0906 Last data filed at 10/30/2018 2100 Gross per 24 hour  Intake 120 ml  Output 1505 ml  Net -1385 ml     Physical Exam: General:  No acute distress, sitting up in bed  HEENT  anicteric, moist oral mucous membranes  Pulm/lungs  supple, no masses  CVS/Heart  regular rhythm with ectopic beats  Abdomen:   Soft, nontender  Extremities:  Trace to 1+ edema  Neurologic:  Alert, oriented  Skin:  No acute rashes  Access:  Left forearm AV fistula       Basic Metabolic Panel:  Recent Labs  Lab 10/30/18 0216 10/30/18 0510 10/30/18 1516 10/31/18 0440  NA 135 136  --  136  K 4.9 4.8  --  4.4  CL 94* 97*  --  96*  CO2 25 24  --  26  GLUCOSE 104*  88  --  89  BUN 60* 63*  --  42*  CREATININE 7.25* 7.35*  --  5.72*  CALCIUM 8.6* 8.1*  --  8.3*  PHOS  --   --  2.5 2.8     CBC: Recent Labs  Lab 10/30/18 0216 10/30/18 0510 10/31/18 0440  WBC 12.7* 14.2* 11.1*  NEUTROABS 7.8*  --   --   HGB 13.5 12.8* 12.1*  HCT 42.3 40.1 36.8*  MCV 107.4* 106.4* 104.0*  PLT 269 259 245      Lab Results  Component Value Date   HEPBSAG NON REACTIVE 10/30/2018      Microbiology:  Recent Results (from the past 240 hour(s))  Blood Culture (routine x 2)     Status: None (Preliminary result)   Collection Time: 10/30/18  2:16 AM   Specimen: BLOOD  Result Value Ref Range Status   Specimen Description BLOOD RIGHT FOREARM  Final   Special Requests   Final    BOTTLES DRAWN AEROBIC AND ANAEROBIC Blood Culture adequate volume   Culture   Final    NO GROWTH 1 DAY Performed at [facility], [address] [place], [place] [address]    Report Status PENDING  Incomplete  Blood Culture (routine x 2)     Status: None (Preliminary result)   Collection Time: 10/30/18  2:16 AM  Specimen: BLOOD  Result Value Ref Range Status   Specimen Description BLOOD RIGHT ANTECUBITAL  Final   Special Requests   Final    BOTTLES DRAWN AEROBIC AND ANAEROBIC Blood Culture adequate volume   Culture   Final    NO GROWTH 1 DAY Performed at [facility], [address], [place], [place] [address]    Report Status PENDING  Incomplete  SARS Coronavirus 2 by RT PCR (hospital order, performed in [facility] hospital lab) Nasopharyngeal Nasopharyngeal Swab     Status: None   Collection Time: 10/30/18  2:16 AM   Specimen: Nasopharyngeal Swab  Result Value Ref Range Status   SARS Coronavirus 2 NEGATIVE NEGATIVE Final    Comment: (NOTE) If result is NEGATIVE SARS-CoV-2 target nucleic acids are NOT DETECTED. The SARS-CoV-2 RNA is generally detectable in upper and lower  respiratory specimens during the acute phase of infection. The lowest   concentration of SARS-CoV-2 viral copies this assay can detect is 250  copies / mL. A negative result does not preclude SARS-CoV-2 infection  and should not be used as the sole basis for treatment or other  patient management decisions.  A negative result may occur with  improper specimen collection / handling, submission of specimen other  than nasopharyngeal swab, presence of viral mutation(s) within the  areas targeted by this assay, and inadequate number of viral copies  (<250 copies / mL). A negative result must be combined with clinical  observations, patient history, and epidemiological information. If result is POSITIVE SARS-CoV-2 target nucleic acids are DETECTED. The SARS-CoV-2 RNA is generally detectable in upper and lower  respiratory specimens dur ing the acute phase of infection.  Positive  results are indicative of active infection with SARS-CoV-2.  Clinical  correlation with patient history and other diagnostic information is  necessary to determine patient infection status.  Positive results do  not rule out bacterial infection or co-infection with other viruses. If result is PRESUMPTIVE POSTIVE SARS-CoV-2 nucleic acids MAY BE PRESENT.   A presumptive positive result was obtained on the submitted specimen  and confirmed on repeat testing.  While 2019 novel coronavirus  (SARS-CoV-2) nucleic acids may be present in the submitted sample  additional confirmatory testing may be necessary for epidemiological  and / or clinical management purposes  to differentiate between  SARS-CoV-2 and other Sarbecovirus currently known to infect humans.  If clinically indicated additional testing with an alternate test  methodology [phone]) is advised. The SARS-CoV-2 RNA is generally  detectable in upper and lower respiratory sp ecimens during the acute  phase of infection. The expected result is Negative. Fact Sheet for Patients:  StrictlyIdeas.no Fact Sheet  for Healthcare Providers: BankingDealers.co.za This test is not yet approved or cleared by the [place] FDA and has been authorized for detection and/or diagnosis of SARS-CoV-2 by FDA under an Emergency Use Authorization (EUA).  This EUA will remain in effect (meaning this test can be used) for the duration of the COVID-19 declaration under Section 564(b)(1) of the Act, 21 U.S.C. section 360bbb-3(b)(1), unless the authorization is terminated or revoked sooner. Performed at [facility], [address] [place], [place] [address]   MRSA PCR Screening     Status: None   Collection Time: 10/30/18  5:45 AM   Specimen: Nasopharyngeal  Result Value Ref Range Status   MRSA by PCR NEGATIVE NEGATIVE Final    Comment:        The GeneXpert MRSA Assay (FDA approved for NASAL  specimens only), is one component of a comprehensive MRSA colonization surveillance program. It is not intended to diagnose MRSA infection nor to guide or monitor treatment for MRSA infections. Performed at [facility], [address] [place], [place] [address]     Coagulation Studies: Recent Labs    10/30/18 0216 10/31/18 0440  LABPROT 13.6 15.1  INR 1.1 1.2    Urinalysis: No results for input(s): COLORURINE, LABSPEC, PHURINE, GLUCOSEU, HGBUR, BILIRUBINUR, KETONESUR, PROTEINUR, UROBILINOGEN, NITRITE, LEUKOCYTESUR in the last 72 hours.  Invalid input(s): APPERANCEUR    Imaging: Dg Chest Port 1 View  Result Date: 10/30/2018 CLINICAL DATA:  63 year old male with fever. EXAM: PORTABLE CHEST 1 VIEW COMPARISON:  Chest radiograph dated 11/22/2017. FINDINGS: There is shallow inspiration with left lung base atelectasis or infiltrate. Left peripheral and subpleural streaky densities may represent atelectatic changes or developing infiltrate. There is an increased density in the right hilar region which may represent confluence of prominent hilar vessels and related to  underlying pulmonary hypertension. An infiltrate or mass is not excluded. Clinical correlation and follow-up to resolution recommended. There is atherosclerotic calcification of the aortic arch. Increased tissue density surrounding the aortic arch may represent atelectatic changes in the adjacent lung. If there is clinical concern for acute aortic pathology further evaluation with CT with IV contrast is recommended. No large pleural effusion or pneumothorax. Stable cardiomegaly. No acute osseous pathology. Cervical ACDF. IMPRESSION: 1. Left lung base streaky densities may represent atelectasis or infiltrate. 2. Increased density in the right hilar region may represent dilated hilar vessels. An infiltrate or mass is not excluded. Clinical correlation and follow-up to resolution recommended. 3. Crescentic density adjacent to the aortic arch may represent atelectasis in the adjacent lung. If there is clinical concern for acute aortic pathology, further evaluation with CT with IV contrast is recommended. 4. Stable cardiomegaly. Electronically Signed   By: Anner Crete M.D.   On: 10/30/2018 02:44     Medications:   . sodium chloride 10 mL/hr at 10/30/18 0253   . artificial tears   Both Eyes BID  . aspirin  81 mg Oral Daily  . atorvastatin  80 mg Oral Daily  . calcium acetate  1,334 mg Oral TID WC  . Chlorhexidine Gluconate Cloth  6 each Topical Q0600  . cinacalcet  90 mg Oral Q breakfast  . cycloSPORINE  1 drop Both Eyes BID  . diphenhydrAMINE  50 mg Intravenous Once  . docusate sodium  100 mg Oral Daily  . dorzolamide-timolol  1 drop Both Eyes BID  . heparin  5,000 Units Subcutaneous Q8H  . hydroxypropyl methylcellulose / hypromellose  1 drop Both Eyes Q4H while awake  . latanoprost  1 drop Both Eyes QHS  . loratadine  10 mg Oral Daily  . methylPREDNISolone (SOLU-MEDROL) injection  125 mg Intravenous Once  . mirtazapine  15 mg Oral QHS  . multivitamin  1 tablet Oral QHS  . pantoprazole  40  mg Oral Daily  . sertraline  150 mg Oral QHS  . sevelamer carbonate  0.8 g Oral TID WC  . ticagrelor  90 mg Oral BID   acetaminophen, acetaminophen, heparin, heparin, Melatonin, methocarbamol, midodrine, morphine injection, nitroGLYCERIN, ondansetron (ZOFRAN) IV, oxyCODONE, senna-docusate  Assessment/ Plan:  63 y.o. male with ESRD, dialysis for almost 30 years, during the patient, recent cervical decompression, autoimmune skin disease, STEMI s/p PCI 2 DES to LAD on 10/30/2018 is admitted for:  Principal Problem:   STEMI (ST elevation myocardial infarction) ([place]) Active  Problems:   STEMI involving left anterior descending coronary artery ([place])  VA /88 kg/ MWF/ 3.45 min. Left arm AVF  #. ESRD Patient is critically ill with recent STEMI s/p PCI 10/26 Plan for routine HD on Wednesday   #. Anemia of CKD  Lab Results  Component Value Date   HGB 12.1 (L) 10/31/2018  Hold Epogen because hemoglobin above goal  #. SHPTH  No results found for: PTH Lab Results  Component Value Date   PHOS 2.8 10/31/2018  Monitor phosphorus during hospitalization  # STEMI Underwent emergent PCI 10/26.  Severe multivessel coronary disease with culprit lesion involving proximal and mid LAD treated with 2 drug-eluting stents.  Currently being monitored in the ICU    LOS: [place] 10/27/20209:06 [place], [place]

## 2018-10-31 NOTE — Progress Notes (Signed)
ANTICOAGULATION CONSULT NOTE - Initial Consult  Pharmacy Consult for Heparin Indication: chest pain/ACS  Allergies  Allergen Reactions  . Albumin (Human) Hives  . Aloe   . Erythromycin Hives  . Iodinated Diagnostic Agents Hives  . Iron Dextran   . Metoprolol Tartrate   . Penicillins Swelling  . Quinine Sulfate [Quinine]   . Rabeprazole   . Sulfa Antibiotics Other (See Comments)  . Tape Other (See Comments)    Removes skin.  . Trimethoprim    Patient Measurements: Height: 5\' 9"  (175.3 cm) Weight: 190 lb 11.2 oz (86.5 kg) IBW/kg (Calculated) : 70.7 HEPARIN DW (KG): 88  Vital Signs: Temp: 98.3 F (36.8 C) (10/27 2000) Temp Source: Oral (10/27 2000) BP: 121/88 (10/27 1800) Pulse Rate: 73 (10/27 1800)  Labs: Recent Labs    10/30/18 0216 10/30/18 0510 10/30/18 0603  10/30/18 2316 10/31/18 0440 10/31/18 2133  HGB 13.5 12.8*  --   --   --  12.1*  --   HCT 42.3 40.1  --   --   --  36.8*  --   PLT 269 259  --   --   --  245  --   APTT 34  --  >160*  --   --   --   --   LABPROT 13.6  --   --   --   --  15.1  --   INR 1.1  --   --   --   --  1.2  --   CREATININE 7.25* 7.35*  --   --   --  5.72*  --   TROPONINIHS 132* 129*  --    < > 435* 459* 442*   < > = values in this interval not displayed.    Estimated Creatinine Clearance: 14.4 mL/min (A) (by C-G formula based on SCr of 5.72 mg/dL (H)).   Medical History: Past Medical History:  Diagnosis Date  . Bullous dermatosis   . Dialysis patient ([place])   . Dysphagia   . FSGS (focal segmental glomerulosclerosis)   . Hyperlipidemia   . Renal insufficiency   . Spinal stenosis, cervical region     Medications:  Medications Prior to Admission  Medication Sig Dispense Refill Last Dose  . amLODipine (NORVASC) 5 MG tablet Take 5 mg by mouth daily.    10/29/2018 at 0900  . atorvastatin (LIPITOR) 40 MG tablet Take 20 mg by mouth daily.    10/29/2018 at 2100  . b complex-vitamin c-folic acid (NEPHRO-VITE) 0.8 MG TABS  tablet Take 1 tablet by mouth at bedtime.   10/29/2018 at 2100  . calcium acetate (PHOSLO) 667 MG capsule Take 1,334 mg by mouth 3 (three) times daily with meals.    10/29/2018 at 1800  . carboxymethylcellulose (REFRESH PLUS) 0.5 % SOLN 1 drop every 2 (two) hours while awake.   prn at prn  . carvedilol (COREG) 25 MG tablet Take 25 mg by mouth 2 (two) times daily with a meal.    10/29/2018 at 1800  . cetirizine (ZYRTEC) 10 MG tablet Take 10 mg by mouth daily.   10/29/2018 at 2100  . cinacalcet (SENSIPAR) 90 MG tablet Take 90 mg by mouth daily.   10/29/2018 at 0900  . cycloSPORINE (RESTASIS) 0.05 % ophthalmic emulsion Place 1 drop into both eyes 2 (two) times daily.   10/29/2018 at 2000  . docusate sodium (COLACE) 100 MG capsule Take 100 mg by mouth daily.   10/29/2018 at 0900  . dorzolamidel-timolol (COSOPT)  22.3-6.8 MG/ML SOLN ophthalmic solution 1 drop 2 (two) times daily.   10/29/2018 at 2100  . hydrALAZINE (APRESOLINE) 10 MG tablet Take 10 mg by mouth every 4 (four) hours as needed (systolic BP >[id number]).    10/29/2018 at 1500  . hydrOXYzine (VISTARIL) 25 MG capsule Take 25 mg by mouth 3 (three) times daily as needed.   10/29/2018 at 2100  . ketoconazole (NIZORAL) 2 % shampoo Apply 1 application topically 2 (two) times a week.   Past Week at Unknown time  . lactulose (CHRONULAC) 10 GM/15ML solution Take by mouth 3 (three) times daily.   10/29/2018 at 2100  . latanoprost (XALATAN) 0.005 % ophthalmic solution Place 1 drop into both eyes at bedtime.   10/29/2018 at 2100  . losartan (COZAAR) 50 MG tablet Take 50 mg by mouth daily. Take one Tuesday, Thursday, Saturday, Sunday   10/29/2018 at 0900  . mirtazapine (REMERON) 15 MG tablet Take 15 mg by mouth at bedtime.   10/29/2018 at 2100  . omeprazole (PRILOSEC) 20 MG capsule Take 20 mg by mouth 2 (two) times daily before a meal.   10/29/2018 at 1700  . Psyllium (KONSYL-D PO) Take 5 mLs by mouth 2 (two) times daily.   10/29/2018 at 1800  .  senna-docusate (SENOKOT-S) 8.6-50 MG tablet Take 1 tablet by mouth 2 (two) times daily as needed (to prevent constipation; scheduled until bowel movement).    10/29/2018 at 2100  . sertraline (ZOLOFT) 50 MG tablet Take 150 mg by mouth at bedtime.   10/29/2018 at 2100  . sevelamer carbonate (RENVELA) 0.8 g PACK packet Take 0.8 g by mouth 3 (three) times daily with meals.   10/29/2018 at 1800  . acetaminophen (TYLENOL) 325 MG tablet Take 650 mg by mouth every 8 (eight) hours as needed (pain or fever).    prn at prn  . betamethasone dipropionate (DIPROLENE) 0.05 % ointment Apply topically 2 (two) times daily.   prn at prn  . bisacodyl (DULCOLAX) 10 MG suppository Place 10 mg rectally daily as needed for moderate constipation.   prn at prn  . Carboxymethylcellul-Glycerin (REFRESH OPTIVE SENSITIVE OP) Place 1 drop into both eyes every 4 (four) hours.   prn at prn  . clobetasol ointment (TEMOVATE) 7.34 % Apply 1 application topically 2 (two) times daily.   prn at prn  . Melatonin 5 MG CAPS Take by mouth.   prn at prn  . methocarbamol (ROBAXIN) 500 MG tablet Take 500 mg by mouth 2 (two) times daily.    Completed Course at Unknown time  . mupirocin cream (BACTROBAN) 2 % Apply 1 application topically 2 (two) times daily. (Patient not taking: Reported on 10/30/2018) 30 g 0 Completed Course at Unknown time  . naloxone (NARCAN) nasal spray 4 mg/0.1 mL Place 1 spray into the nose.   prn at prn  . oxycodone (OXY-IR) 5 MG capsule Take 5-10 mg by mouth every 4 (four) hours as needed for pain.    Completed Course at Unknown time  . polyvinyl alcohol (LIQUIFILM TEARS) 1.4 % ophthalmic solution Place 1 drop into both eyes 4 (four) times daily as needed for dry eyes.   prn at prn  . terbinafine (LAMISIL) 1 % cream Apply 1 application topically 2 (two) times daily.   prn at prn   Assessment: Per NP note - Pt's EKG with ST Elevation in Inferior leads (appears more pronounced than on previous EKG) along with A-fib w/ RVR  (HR 110-130).  Pt's high sensitivity  troponin 442 (previously 459). Pharmacy asked to initiate and monitor Heparin drip for ACS & A-fib.  Goal of Therapy:  Heparin level 0.3-0.7 units/ml Monitor platelets by anticoagulation protocol: Yes   Plan:  Heparin 4000 units bolus now x 1 Heparin infusion at 1000 units/hr Check heparin level 8 hours after infusion started.  Hart Robinsons A 10/31/2018,10:26 PM

## 2018-10-31 NOTE — Progress Notes (Signed)
Called to bedside by nursing as pt is complaining of "chest heaviness" and nausea. Of note pt has complained of chest heaviness throughout the day. He reports that the heaviness has improved since nursing has increased Nitroglycerin infusion.  Pt also appears to be in Atrial fibrillation w/ RVR on telemetry, HR ranging from 110-140, BP 120/81.  Will give 5 mg IV Cardizem, obtain STAT EKG, and trend troponin.  CTA Chest obtained earlier today with no noted aortic dissection or acute aortic syndrome.  Plan is for pt to have repeat Cardiac catheterization tomorrow.    Darel Hong, AGACNP-BC [place] [facility] Pager: [phone]

## 2018-10-31 NOTE — Progress Notes (Signed)
Established hemodialysis patient known at [facility] in [place] MWF 12:00. Per Sarben from [facility], patient is transported to and from treatments by the [facility]. Please contact me with any dialysis placement concerns.  Elvera Bicker Dialysis Coordinator [phone]

## 2018-10-31 NOTE — Progress Notes (Signed)
CARE PLAN  Anterior STEMI: Patient's presentation is consistent with acute MI, likely due to plaque rupture and thrombus formation in the mid LAD. The patient has severe multivessel CAD involving other territories as well. He is status post primary PCI to the proximal and mid LAD with marked improvement in his chest pain.  Follow up Cardiology recs  HTN emergency-pain and BP control

## 2018-10-31 NOTE — Plan of Care (Signed)
Nutrition Education Note RD working remotely.  RD consulted for nutrition education regarding a Heart Healthy diet.   Lipid Panel     Component Value Date/Time   CHOL 158 10/31/2018 0440   TRIG 119 10/31/2018 0440   HDL 44 10/31/2018 0440   CHOLHDL 3.6 10/31/2018 0440   VLDL 24 10/31/2018 0440   LDLCALC 90 10/31/2018 0440   RD spoke with patient via phone. Patient reports that he has been on HD for 32 years and stated "I kinda know what I am doing already" and that he is "mostly" compliant with following a renal diet at home. Patient stated that his son-in-law is a Biomedical scientist and will be cooking his meals for him. Patient receptive to receiving education materials mailed to his home to share with family. Patient confirmed [place], [place]. RD will mail "Heart Healthy Nutrition Therapy" handout from the [employer]. Reviewed patient's dietary recall. Provided examples on ways to decrease sodium and fat intake in diet. Discouraged intake of processed foods and use of salt shaker. Encouraged fresh fruits and vegetables as well as whole grain sources of carbohydrates to maximize fiber intake. Teach back method used.  Expect fair compliance.  Body mass index is 28.16 kg/m. Pt meets criteria for overweight based on current BMI.  Current diet order is HH, patient is consuming approximately 40% of meals at this time. Labs and medications reviewed. No further nutrition interventions warranted at this time. RD contact information provided. If additional nutrition issues arise, please re-consult RD.  Lajuan Lines, [place], [place] Clinical Nutrition Office [phone] After Hours/Weekend Pager: [phone]

## 2018-10-31 NOTE — Progress Notes (Signed)
Dr. Saunders Revel called to check up on pt.  Pt's "chest heaviness" has greatly improved.  HR remains 110-140 in A-fib, and BP is now soft (82/72).  Discussed with Dr. Saunders Revel.  Dr. Saunders Revel recommends Amiodarone drip (no bolus) and small IV fluid bolus.     Darel Hong, AGACNP-BC [facility] Pager: [phone]

## 2018-10-31 NOTE — Progress Notes (Signed)
Pt's EKG with ST Elevation in Inferior leads (appears more pronounced than on previous EKG) along with A-fib w/ RVR (HR 110-130).  Pt's high sensitivity troponin 442 (previously 459).  Called and discussed with Dr. Saunders Revel who reviewed pt's EKG.  Per Dr. Saunders Revel, pt does not currently need to go to the CATH lab.  He does recommend placing pt on Heparin drip for ACS & A-fib.  He also recommends giving low dose metoprolol to help control HR.  If needed, may use small boluses of IV Fluids for hypotension.  Will consult pharmacy for Heparin drip, and will give 2.5 mg Metoprolol and reassess HR and BP.    Darel Hong, AGACNP-BC [facility] [facility] Pager: [phone]

## 2018-10-31 NOTE — Progress Notes (Signed)
Progress Note  Patient Name: William Carpenter Date of Encounter: 10/31/2018  Primary Cardiologist: [facility]  Subjective   Feels better than yesterday but still mild chest heaviness.  No shortness of breath.  Shoulder and low back pain have resolved.  Minimal soreness at right femoral cath site.  Inpatient Medications    Scheduled Meds:  artificial tears   Both Eyes BID   aspirin  81 mg Oral Daily   atorvastatin  80 mg Oral Daily   calcium acetate  1,334 mg Oral TID WC   Chlorhexidine Gluconate Cloth  6 each Topical Q0600   cinacalcet  90 mg Oral Q breakfast   cycloSPORINE  1 drop Both Eyes BID   diphenhydrAMINE  50 mg Intravenous Once   docusate sodium  100 mg Oral Daily   dorzolamide-timolol  1 drop Both Eyes BID   heparin  5,000 Units Subcutaneous Q8H   hydroxypropyl methylcellulose / hypromellose  1 drop Both Eyes Q4H while awake   latanoprost  1 drop Both Eyes QHS   loratadine  10 mg Oral Daily   methylPREDNISolone (SOLU-MEDROL) injection  125 mg Intravenous Once   mirtazapine  15 mg Oral QHS   multivitamin  1 tablet Oral QHS   pantoprazole  40 mg Oral Daily   sertraline  150 mg Oral QHS   sevelamer carbonate  0.8 g Oral TID WC   ticagrelor  90 mg Oral BID   Continuous Infusions:  sodium chloride 10 mL/hr at 10/30/18 0253   PRN Meds: acetaminophen, acetaminophen, heparin, heparin, Melatonin, methocarbamol, midodrine, morphine injection, nitroGLYCERIN, ondansetron (ZOFRAN) IV, oxyCODONE, senna-docusate   Vital Signs    Vitals:   10/31/18 0400 10/31/18 0700 10/31/18 0800 10/31/18 0845  BP: 97/64 93/66 (!) 120/92   Pulse: 66 67 86 73  Resp: (!) 22 (!) 22  (!) 21  Temp: 98.7 F (37.1 C)   98.1 F (36.7 C)  TempSrc: Oral   Axillary  SpO2: 94% (!) 88% 92% 97%  Weight:      Height:        Intake/Output Summary (Last 24 hours) at 10/31/2018 0918 Last data filed at 10/30/2018 2100 Gross per 24 hour  Intake 120 ml  Output 1505 ml   Net -1385 ml   Last 3 Weights 10/30/2018 10/30/2018 10/30/2018  Weight (lbs) 190 lb 11.2 oz 194 lb 0.1 oz 194 lb 0.1 oz  Weight (kg) 86.5 kg 88 kg 88 kg      Telemetry    NSR with PVC's - Personally Reviewed  ECG   No new tracing.  Physical Exam   GEN: No acute distress.   Neck: No JVD Cardiac: RRR, no murmurs, rubs, or gallops.  Respiratory: Clear to auscultation bilaterally. GI: Soft, nontender, non-distended  MS: No edema; No deformity.  Right femoral cath site covered with clean dressing.  No hematoma or bruit. Neuro:  Nonfocal  Psych: Normal affect   Labs    High Sensitivity Troponin:   Recent Labs  Lab 10/30/18 0216 10/30/18 0510 10/30/18 1103 10/30/18 1721 10/30/18 2316  TROPONINIHS 132* 129* 161* 318* 435*      Chemistry Recent Labs  Lab 10/30/18 0216 10/30/18 0510 10/31/18 0440  NA 135 136 136  K 4.9 4.8 4.4  CL 94* 97* 96*  CO2 25 24 26   GLUCOSE 104* 88 89  BUN 60* 63* 42*  CREATININE 7.25* 7.35* 5.72*  CALCIUM 8.6* 8.1* 8.3*  PROT 9.3*  --   --  ALBUMIN 3.8  --  3.2*  AST 26  --   --   ALT 18  --   --   ALKPHOS 212*  --   --   BILITOT 1.7*  --   --   GFRNONAA 7* 7* 10*  GFRAA 8* 8* 11*  ANIONGAP 16* 15 14     Hematology Recent Labs  Lab 10/30/18 0216 10/30/18 0510 10/31/18 0440  WBC 12.7* 14.2* 11.1*  RBC 3.94* 3.77* 3.54*  HGB 13.5 12.8* 12.1*  HCT 42.3 40.1 36.8*  MCV 107.4* 106.4* 104.0*  MCH 34.3* 34.0 34.2*  MCHC 31.9 31.9 32.9  RDW 14.0 13.9 14.2  PLT 269 259 245    BNPNo results for input(s): BNP, PROBNP in the last 168 hours.   DDimer No results for input(s): DDIMER in the last 168 hours.   Radiology    Dg Chest Port 1 View  Result Date: 10/30/2018 CLINICAL DATA:  63 year old male with fever. EXAM: PORTABLE CHEST 1 VIEW COMPARISON:  Chest radiograph dated 11/22/2017. FINDINGS: There is shallow inspiration with left lung base atelectasis or infiltrate. Left peripheral and subpleural streaky densities may  represent atelectatic changes or developing infiltrate. There is an increased density in the right hilar region which may represent confluence of prominent hilar vessels and related to underlying pulmonary hypertension. An infiltrate or mass is not excluded. Clinical correlation and follow-up to resolution recommended. There is atherosclerotic calcification of the aortic arch. Increased tissue density surrounding the aortic arch may represent atelectatic changes in the adjacent lung. If there is clinical concern for acute aortic pathology further evaluation with CT with IV contrast is recommended. No large pleural effusion or pneumothorax. Stable cardiomegaly. No acute osseous pathology. Cervical ACDF. IMPRESSION: 1. Left lung base streaky densities may represent atelectasis or infiltrate. 2. Increased density in the right hilar region may represent dilated hilar vessels. An infiltrate or mass is not excluded. Clinical correlation and follow-up to resolution recommended. 3. Crescentic density adjacent to the aortic arch may represent atelectasis in the adjacent lung. If there is clinical concern for acute aortic pathology, further evaluation with CT with IV contrast is recommended. 4. Stable cardiomegaly. Electronically Signed   By: Anner Crete M.D.   On: 10/30/2018 02:44    Cardiac Studies   LHC/PCI (10/30/2018): 1. Severe three-vessel coronary artery disease with heavy calcification, including sequential 70% and 99% proximal and mid LAD stenoses (culprit lesions), long segment of irregular 50% distal LAD stenosis, 50% ostial LCx stenosis, severe disease involving diagonal branches and distal portion of OM2, and focal 90% stenosis in the proximal rPDA. 2. Moderately to severely reduced left ventricular systolic function with mid and apical anterior, apical, and apical inferior hypokinesis/akinesis.  LVEF approximately 35%. 3. Mildly elevated left ventricular filling pressure. 4. Successful PCI to  proximal and mid LAD using overlapping Synergy 3.5 x 16 mm and 3.0 x 28 mm drug-eluting stents with less than 10% residual stenosis and TIMI-3 flow.  Echo (10/30/2018):  1. Left ventricular ejection fraction, by visual estimation, is 50 to 55%. The left ventricle has normal function. Normal left ventricular size. There is no left ventricular hypertrophy.  2. Definity contrast agent was given IV to delineate the left ventricular endocardial borders.  3. Left ventricular diastolic Doppler parameters are consistent with pseudonormalization pattern of LV diastolic filling.  4. There is apical akinesis, mid to apical anteroseptal akinesis.  5. Global right ventricle has normal systolic function.The right ventricular size is normal. Right vetricular wall thickness was  not assessed.  6. Left atrial size was mild-moderately dilated.  7. Right atrial size was normal.  8. Mild calcification of the posterior mitral valve leaflet(s).  9. The mitral valve is degenerative. No evidence of mitral valve regurgitation. 10. The tricuspid valve is grossly normal. Tricuspid valve regurgitation was not visualized by color flow Doppler. 11. The aortic valve has an indeterminant number of cusps Aortic valve regurgitation was not visualized by color flow Doppler. Mild to moderate aortic valve sclerosis/calcification without any evidence of aortic stenosis. 12. The pulmonic valve was not well visualized. Pulmonic valve regurgitation is not visualized by color flow Doppler. 13. The inferior vena cava is normal in size with greater than 50% respiratory variability, suggesting right atrial pressure of 3 mmHg.  Patient Profile     63 y.o. 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  Assessment & Plan    STEMI: Chest pain much improved, though mild heaviness persists.  Post-PCI EKG showed improvement in anterior ST elevation,  though minimal ST elevation was noted in the inferior leads.  Troponin has trended up, though less than would be expected for an acute anterior MI from LAD occlusion.  I wonder if this was a subacute occlusion, though no significant collaterals were evident on the angiogram.  Continued chest discomfort raises the possibility that rPDA stenosis could be playing a role as well.  Additionally, CXR yesterday made report of abnormal shadow adjacent to the aorta.  Though presentation was not consistent with acute aortic syndrome, given continued chest pain, CTA of the chest should be considered.  Continue ASA and ticagrelor.  Trend troponin until it has peaked.  Repeat EKG.  Consider CTA of the chest to exclude aortic pathology.  Add carvedilol 3.125 mg BID for antianginal and HF therapy.  Continue atorvastatin 80 mg daily.  If CTA chest negative and patient continues to have chest discomfort, PCI to rPDA will need to be considered during this admission.  Ischemic cardiomyopathy: LVEF ~35% by LV-gram, read as 50-55% by echo.  Echo shows apical akinesis consistent with LAD territory infarct.  I suspect LVEF is moderately reduced based on cath and echo findings.  Restart carvedilol 3.125 mg BID.  Defer restarting losartan given soft blood pressure.  Volume management per nephrology; Mr. Trott appears euvolemic at this time.  ESRD:  Per nephrology.  May need repeat HD today following CTA chest.  Hyperlipidemia:  Continue atorvastatin 80 mg daily.  For questions or updates, please contact [place] Please consult [email or web address] for contact info under [facility] cardiology.     Signed, Nelva Bush, MD  10/31/2018, 9:18 AM

## 2018-11-01 ENCOUNTER — Inpatient Hospital Stay
Admission: EM | Disposition: A | Discharge status: Home Health | Payer: Self-pay | Source: Home / Self Care | Attending: Internal Medicine

## 2018-11-01 ENCOUNTER — Encounter
Admission: EM | Disposition: A | Discharge status: Home Health | Payer: Self-pay | Source: Home / Self Care | Attending: Internal Medicine

## 2018-11-01 DIAGNOSIS — I255 Ischemic cardiomyopathy: Secondary | ICD-10-CM

## 2018-11-01 DIAGNOSIS — N186 End stage renal disease: Secondary | ICD-10-CM | POA: Diagnosis not present

## 2018-11-01 DIAGNOSIS — I2510 Atherosclerotic heart disease of native coronary artery without angina pectoris: Secondary | ICD-10-CM | POA: Diagnosis not present

## 2018-11-01 DIAGNOSIS — R079 Chest pain, unspecified: Secondary | ICD-10-CM | POA: Diagnosis not present

## 2018-11-01 DIAGNOSIS — I2102 ST elevation (STEMI) myocardial infarction involving left anterior descending coronary artery: Secondary | ICD-10-CM | POA: Diagnosis not present

## 2018-11-01 HISTORY — PX: RIGHT/LEFT HEART CATH AND CORONARY ANGIOGRAPHY: CATH118266

## 2018-11-01 HISTORY — PX: CORONARY STENT INTERVENTION: CATH118234

## 2018-11-01 LAB — RENAL FUNCTION PANEL
Albumin: 3.3 g/dL — ABNORMAL LOW (ref 3.5–5.0)
Anion gap: 15 (ref 5–15)
BUN: 63 mg/dL — ABNORMAL HIGH (ref 8–23)
CO2: 24 mmol/L (ref 22–32)
Calcium: 8.1 mg/dL — ABNORMAL LOW (ref 8.9–10.3)
Chloride: 93 mmol/L — ABNORMAL LOW (ref 98–111)
Creatinine, Ser: 7.1 mg/dL — ABNORMAL HIGH (ref 0.61–1.24)
GFR calc Af Amer: 9 mL/min — ABNORMAL LOW (ref >60)
GFR calc non Af Amer: 7 mL/min — ABNORMAL LOW (ref >60)
Glucose, Bld: 135 mg/dL — ABNORMAL HIGH (ref 70–99)
Phosphorus: 3.6 mg/dL (ref 2.5–4.6)
Potassium: 5.6 mmol/L — ABNORMAL HIGH (ref 3.5–5.1)
Sodium: 132 mmol/L — ABNORMAL LOW (ref 135–145)

## 2018-11-01 LAB — POCT ACTIVATED CLOTTING TIME
Activated Clotting Time: 213 seconds
Activated Clotting Time: 224 seconds
Activated Clotting Time: 246 seconds
Activated Clotting Time: 197 seconds

## 2018-11-01 LAB — CBC
HCT: 32.8 % — ABNORMAL LOW (ref 39.0–52.0)
Hemoglobin: 11.1 g/dL — ABNORMAL LOW (ref 13.0–17.0)
MCH: 34.4 pg — ABNORMAL HIGH (ref 26.0–34.0)
MCHC: 33.8 g/dL (ref 30.0–36.0)
MCV: 101.5 fL — ABNORMAL HIGH (ref 80.0–100.0)
Platelets: 241 10*3/uL (ref 150–400)
RBC: 3.23 MIL/uL — ABNORMAL LOW (ref 4.22–5.81)
RDW: 14 % (ref 11.5–15.5)
WBC: 7.1 10*3/uL (ref 4.0–10.5)
nRBC: 0 % (ref 0.0–0.2)

## 2018-11-01 LAB — BASIC METABOLIC PANEL
Anion gap: 17 — ABNORMAL HIGH (ref 5–15)
BUN: 61 mg/dL — ABNORMAL HIGH (ref 8–23)
CO2: 22 mmol/L (ref 22–32)
Calcium: 7.8 mg/dL — ABNORMAL LOW (ref 8.9–10.3)
Chloride: 91 mmol/L — ABNORMAL LOW (ref 98–111)
Creatinine, Ser: 6.36 mg/dL — ABNORMAL HIGH (ref 0.61–1.24)
GFR calc Af Amer: 10 mL/min — ABNORMAL LOW (ref >60)
GFR calc non Af Amer: 9 mL/min — ABNORMAL LOW (ref >60)
Glucose, Bld: 137 mg/dL — ABNORMAL HIGH (ref 70–99)
Potassium: 5.2 mmol/L — ABNORMAL HIGH (ref 3.5–5.1)
Sodium: 130 mmol/L — ABNORMAL LOW (ref 135–145)

## 2018-11-01 LAB — MAGNESIUM
Magnesium: 1.8 mg/dL (ref 1.7–2.4)
Magnesium: 1.8 mg/dL (ref 1.7–2.4)

## 2018-11-01 LAB — TROPONIN I (HIGH SENSITIVITY): Troponin I (High Sensitivity): 423 ng/L (ref <18)

## 2018-11-01 LAB — HEPARIN LEVEL (UNFRACTIONATED): Heparin Unfractionated: 0.37 IU/mL (ref 0.30–0.70)

## 2018-11-01 SURGERY — RIGHT/LEFT HEART CATH AND CORONARY ANGIOGRAPHY
Anesthesia: Moderate Sedation

## 2018-11-01 MED ORDER — SODIUM CHLORIDE 0.9 % IV SOLN: 250.0000 mL | INTRAVENOUS | Status: DC | PRN

## 2018-11-01 MED ORDER — VERAPAMIL HCL 2.5 MG/ML IV SOLN
INTRAVENOUS | Status: AC
  Filled 2018-11-01: qty 2

## 2018-11-01 MED ORDER — HEPARIN SODIUM (PORCINE) 1000 UNIT/ML IJ SOLN
INTRAMUSCULAR | Status: AC
  Filled 2018-11-01: qty 1

## 2018-11-01 MED ORDER — SODIUM CHLORIDE 0.9% FLUSH
3.0000 mL | Freq: Two times a day (BID) | INTRAVENOUS | Status: DC
  Administered 2018-11-03 – 2018-11-05 (×4): 3 mL via INTRAVENOUS

## 2018-11-01 MED ORDER — HEPARIN (PORCINE) 25000 UT/250ML-% IV SOLN
1600.0000 [IU]/h | INTRAVENOUS | Status: DC
  Administered 2018-11-03: 1400 [IU]/h via INTRAVENOUS
  Filled 2018-11-01 (×2): qty 250

## 2018-11-01 MED ORDER — SODIUM CHLORIDE 0.9% FLUSH
3.0000 mL | Freq: Two times a day (BID) | INTRAVENOUS | Status: DC
  Administered 2018-11-01 – 2018-11-07 (×10): 3 mL via INTRAVENOUS

## 2018-11-01 MED ORDER — MIDAZOLAM HCL 2 MG/2ML IJ SOLN
INTRAMUSCULAR | Status: AC
  Filled 2018-11-01: qty 2

## 2018-11-01 MED ORDER — HEPARIN SODIUM (PORCINE) 1000 UNIT/ML IJ SOLN
INTRAMUSCULAR | Status: DC | PRN
  Administered 2018-11-01 (×2): 3000 [IU] via INTRAVENOUS
  Administered 2018-11-01: 7000 [IU] via INTRAVENOUS

## 2018-11-01 MED ORDER — PATIROMER SORBITEX CALCIUM 8.4 G PO PACK
16.8000 g | PACK | Freq: Once | ORAL | Status: AC
  Administered 2018-11-01: 16.8 g via ORAL
  Filled 2018-11-01: qty 2

## 2018-11-01 MED ORDER — SODIUM CHLORIDE 0.9% FLUSH: 3.0000 mL | INTRAVENOUS | Status: DC | PRN

## 2018-11-01 MED ORDER — HYDRALAZINE HCL 20 MG/ML IJ SOLN: 10.0000 mg | INTRAMUSCULAR | Status: AC | PRN

## 2018-11-01 MED ORDER — NITROGLYCERIN 1 MG/10 ML FOR IR/CATH LAB
INTRA_ARTERIAL | Status: AC
  Filled 2018-11-01: qty 10

## 2018-11-01 MED ORDER — HEPARIN (PORCINE) IN NACL 1000-0.9 UT/500ML-% IV SOLN
INTRAVENOUS | Status: AC
  Filled 2018-11-01: qty 1000

## 2018-11-01 MED ORDER — HEPARIN (PORCINE) IN NACL 1000-0.9 UT/500ML-% IV SOLN
INTRAVENOUS | Status: DC | PRN
  Administered 2018-11-01: 500 mL

## 2018-11-01 MED ORDER — NITROGLYCERIN 1 MG/10 ML FOR IR/CATH LAB
INTRA_ARTERIAL | Status: DC | PRN
  Administered 2018-11-01: 100 ug via INTRACORONARY

## 2018-11-01 MED ORDER — IOHEXOL 300 MG/ML  SOLN
INTRAMUSCULAR | Status: DC | PRN
  Administered 2018-11-01: 130 mL

## 2018-11-01 MED ORDER — MAGNESIUM SULFATE 2 GM/50ML IV SOLN: 2.0000 g | Freq: Once | INTRAVENOUS | Status: DC

## 2018-11-01 MED ORDER — MIDAZOLAM HCL 2 MG/2ML IJ SOLN
INTRAMUSCULAR | Status: DC | PRN
  Administered 2018-11-01: 0.5 mg via INTRAVENOUS

## 2018-11-01 MED ORDER — HEPARIN (PORCINE) 25000 UT/250ML-% IV SOLN: 1000.0000 [IU]/h | INTRAVENOUS | Status: DC

## 2018-11-01 MED ORDER — SODIUM CHLORIDE 0.9 % IV SOLN: INTRAVENOUS | Status: DC

## 2018-11-01 MED ORDER — FENTANYL CITRATE (PF) 100 MCG/2ML IJ SOLN
INTRAMUSCULAR | Status: DC | PRN
  Administered 2018-11-01: 12.5 ug via INTRAVENOUS

## 2018-11-01 MED ORDER — FENTANYL CITRATE (PF) 100 MCG/2ML IJ SOLN
INTRAMUSCULAR | Status: AC
  Filled 2018-11-01: qty 2

## 2018-11-01 SURGICAL SUPPLY — 23 items
BALLN MINITREK RX 2.0X12 (BALLOONS) ×3
BALLN ~~LOC~~ EUPHORA RX 2.5X15 (BALLOONS) ×3
BALLOON MINITREK RX 2.0X12 (BALLOONS) ×1 IMPLANT
BALLOON ~~LOC~~ EUPHORA RX 2.5X15 (BALLOONS) ×1 IMPLANT
CANNULA 5F STIFF (CANNULA) ×3 IMPLANT
CATH INFINITI 5FR JL4 (CATHETERS) ×3 IMPLANT
CATH INFINITI JR4 5F (CATHETERS) ×3 IMPLANT
CATH LAUNCHER 6FR JR4 (CATHETERS) ×3 IMPLANT
CATH SWANZ 7F THERMO (CATHETERS) ×3 IMPLANT
DEVICE CLOSURE MYNXGRIP 6/7F (Vascular Products) ×3 IMPLANT
DEVICE INFLAT 30 PLUS (MISCELLANEOUS) ×3 IMPLANT
FORCEPS HALSTEAD CVD 5IN STRL (INSTRUMENTS) ×3 IMPLANT
GUIDEWIRE EMER 3M J .025X150CM (WIRE) ×3 IMPLANT
KIT MANI 3VAL PERCEP (MISCELLANEOUS) ×3 IMPLANT
KIT RIGHT HEART (MISCELLANEOUS) ×3 IMPLANT
NEEDLE PERC 18GX7CM (NEEDLE) ×3 IMPLANT
PACK CARDIAC CATH (CUSTOM PROCEDURE TRAY) ×3 IMPLANT
SHEATH AVANTI 5FR X 11CM (SHEATH) ×3 IMPLANT
SHEATH AVANTI 6FR X 11CM (SHEATH) ×3 IMPLANT
SHEATH AVANTI 7FRX11 (SHEATH) ×3 IMPLANT
STENT RESOLUTE ONYX 2.25X15 (Permanent Stent) ×3 IMPLANT
WIRE GUIDERIGHT .035X150 (WIRE) ×3 IMPLANT
WIRE RUNTHROUGH .014X180CM (WIRE) ×3 IMPLANT

## 2018-11-01 NOTE — Progress Notes (Addendum)
Progress Note  Patient Name: William Carpenter Date of Encounter: 11/01/2018  Primary Cardiologist: [facility], Dr. Saunders Revel  Subjective   Patient reports that he had shortness of breath and chest discomfort that occurred around 2:30 AM, causing him to need to sit up straight in a chair for at least an hour.  He denies any further episodes since that time.  He is currently resting comfortably in the bed without complaints.  He denies any further chest pain, racing heart rate, palpitations, shortness of breath, or myalgias. He consents today for repeat cath as outlined below.  Inpatient Medications    Scheduled Meds:  artificial tears   Both Eyes BID   aspirin  81 mg Oral Daily   atorvastatin  80 mg Oral Daily   calcium acetate  1,334 mg Oral TID WC   carvedilol  3.125 mg Oral BID WC   Chlorhexidine Gluconate Cloth  6 each Topical Q0600   cinacalcet  90 mg Oral Q breakfast   cycloSPORINE  1 drop Both Eyes BID   diphenhydrAMINE  50 mg Oral Once   Or   diphenhydrAMINE  50 mg Intravenous Once   docusate sodium  100 mg Oral Daily   dorzolamide-timolol  1 drop Both Eyes BID   feeding supplement (NEPRO CARB STEADY)  237 mL Oral BID BM   hydroxypropyl methylcellulose / hypromellose  1 drop Both Eyes Q4H while awake   latanoprost  1 drop Both Eyes QHS   loratadine  10 mg Oral Daily   mirtazapine  15 mg Oral QHS   multivitamin  1 tablet Oral QHS   pantoprazole  40 mg Oral Daily   predniSONE  50 mg Oral Q6H   sertraline  150 mg Oral QHS   sevelamer carbonate  0.8 g Oral TID WC   sodium chloride flush  3 mL Intravenous Q12H   ticagrelor  90 mg Oral BID   Continuous Infusions:  sodium chloride 10 mL/hr at 10/30/18 0253   sodium chloride     sodium chloride     amiodarone 30 mg/hr (11/01/18 0550)   heparin 1,000 Units/hr (10/31/18 2248)   nitroGLYCERIN Stopped (11/01/18 0551)   PRN Meds: sodium chloride, acetaminophen, acetaminophen, Melatonin,  methocarbamol, midodrine, morphine injection, nitroGLYCERIN, ondansetron (ZOFRAN) IV, oxyCODONE, senna-docusate, sodium chloride flush   Vital Signs    Vitals:   10/31/18 1758 10/31/18 1800 10/31/18 2000 11/01/18 0100  BP:  121/88    Pulse: 74 73    Resp: 13 11    Temp: 97.7 F (36.5 C)  98.3 F (36.8 C) 98.4 F (36.9 C)  TempSrc: Axillary  Oral Oral  SpO2: 96% 94%    Weight:      Height:        Intake/Output Summary (Last 24 hours) at 11/01/2018 0839 Last data filed at 10/31/2018 1833 Gross per 24 hour  Intake 204.65 ml  Output --  Net 204.65 ml   Last 3 Weights 10/30/2018 10/30/2018 10/30/2018  Weight (lbs) 190 lb 11.2 oz 194 lb 0.1 oz 194 lb 0.1 oz  Weight (kg) 86.5 kg 88 kg 88 kg      Telemetry    Afib, ventricular rates into the 130s- Personally Reviewed  ECG    Repeat EKG from yesterday shows atrial fibrillation with ventricular rate 111, inferior infarct, possible anteroseptal old infarct, LAD, poor conduction via lead I- Personally Reviewed  Physical Exam   GEN: No acute distress. Somnolent, having just woken for the day. Neck: No  JVD Cardiac:IRIR, no murmurs, rubs, or gallops.  Respiratory: Clear to auscultation bilaterally. HA:[id number], mildly distended  MS: Mild bilateral lower extremity edema; No deformity. Neuro:  Nonfocal  Psych: Normal affect, somnolent   Labs    High Sensitivity Troponin:   Recent Labs  Lab 10/30/18 1721 10/30/18 2316 10/31/18 0440 10/31/18 2133 10/31/18 2331  TROPONINIHS 318* 435* 459* 442* 423*      Cardiac EnzymesNo results for input(s): TROPONINI in the last 168 hours. No results for input(s): TROPIPOC in the last 168 hours.   Chemistry Recent Labs  Lab 10/30/18 0216 10/30/18 0510 10/31/18 0440 11/01/18 0557  NA 135 136 136 132*  K 4.9 4.8 4.4 5.6*  CL 94* 97* 96* 93*  CO2 25 24 26 24   GLUCOSE 104* 88 89 135*  BUN 60* 63* 42* 63*  CREATININE 7.25* 7.35* 5.72* 7.10*  CALCIUM 8.6* 8.1* 8.3* 8.1*  PROT  9.3*  --   --   --   ALBUMIN 3.8  --  3.2* 3.3*  AST 26  --   --   --   ALT 18  --   --   --   ALKPHOS 212*  --   --   --   BILITOT 1.7*  --   --   --   GFRNONAA 7* 7* 10* 7*  GFRAA 8* 8* 11* 9*  ANIONGAP 16* 15 14 15      Hematology Recent Labs  Lab 10/30/18 0510 10/31/18 0440 11/01/18 0557  WBC 14.2* 11.1* 7.1  RBC 3.77* 3.54* 3.23*  HGB 12.8* 12.1* 11.1*  HCT 40.1 36.8* 32.8*  MCV 106.4* 104.0* 101.5*  MCH 34.0 34.2* 34.4*  MCHC 31.9 32.9 33.8  RDW 13.9 14.2 14.0  PLT 259 245 241    BNPNo results for input(s): BNP, PROBNP in the last 168 hours.   DDimer No results for input(s): DDIMER in the last 168 hours.   Radiology    Ct Angio Chest/abd/pel For Dissection W And/or W/wo  Result Date: 10/31/2018 CLINICAL DATA:  63 year old male with history of acute onset of chest and back pain. Suspected aortic dissection. EXAM: CT ANGIOGRAPHY CHEST, ABDOMEN AND PELVIS TECHNIQUE: Multidetector CT imaging through the chest, abdomen and pelvis was performed using the standard protocol during bolus administration of intravenous contrast. Multiplanar reconstructed images and MIPs were obtained and reviewed to evaluate the vascular anatomy. CONTRAST:  116mL OMNIPAQUE IOHEXOL 350 MG/ML SOLN COMPARISON:  CT the chest, abdomen and pelvis 06/26/2016. FINDINGS: CTA CHEST FINDINGS Cardiovascular: There is no crescentic high attenuation associated with the wall of the thoracic aorta on precontrast images to suggest acute intramural hematoma. No evidence of thoracic aortic aneurysm or dissection. Ascending thoracic aorta aortic arch and descending thoracic aorta measure 3.6 cm, 3.0 cm and 2.5 cm in diameter respectively. 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 There is no significant pericardial fluid, thickening or  pericardial calcification. There is aortic atherosclerosis, as well as atherosclerosis of the great vessels of the mediastinum and the coronary arteries, including calcified atherosclerotic plaque in the left main, left anterior descending, left circumflex and right coronary arteries. Calcifications of the aortic valve. Dilatation of the pulmonic trunk (3.8 cm in diameter). Mediastinum/Nodes: No pathologically enlarged mediastinal or hilar lymph nodes. Esophagus is unremarkable in appearance. No axillary lymphadenopathy. Lungs/Pleura: Small calcified granuloma in the apex of the left upper  lobe. 5 mm right upper lobe pulmonary nodule (axial image 78 of series 6), stable compared to the prior study, considered definitively benign. No other suspicious appearing pulmonary nodules or masses are noted. Linear scarring in the left lower lobe, similar to the prior study. Trace left pleural effusion, new compared to the prior study. No acute consolidative airspace disease. Musculoskeletal: Diffuse sclerosis throughout the visualized axial and appendicular skeleton, potentially related to hyperparathyroidism in this patient with history of end stage renal disease. Review of the MIP images confirms the above findings. CTA ABDOMEN AND PELVIS FINDINGS VASCULAR Aorta: Normal caliber aorta without aneurysm, dissection, vasculitis or significant stenosis. Celiac: Patent without evidence of aneurysm, dissection, vasculitis or significant stenosis. SMA: Patent without evidence of aneurysm, dissection, vasculitis or significant stenosis. Renals: Right renal artery is patent without evidence of aneurysm, dissection, vasculitis, fibromuscular dysplasia or significant stenosis. IMA: Patent without evidence of aneurysm, dissection, vasculitis or significant stenosis. Inflow: Patent without evidence of aneurysm, dissection, vasculitis or significant stenosis. Veins: No obvious venous abnormality within the limitations of this arterial  phase study. Review of the MIP images confirms the above findings. NON-VASCULAR Hepatobiliary: No suspicious cystic or solid hepatic lesions are confidently identified on today's arterial phase examination. High attenuation lying dependently in the gallbladder, which presumably represents biliary sludge lying dependently. No surrounding inflammatory changes to suggest an acute cholecystitis at this time. Pancreas: No pancreatic mass. No pancreatic ductal dilatation. No pancreatic or peripancreatic fluid collections or inflammatory changes. Spleen: Status post splenectomy. Adrenals/Urinary Tract: Status post left nephrectomy. Right native kidney is severely atrophic with innumerable small low-attenuation lesions throughout the kidney, presumably cysts related to cystic disease of dialysis. 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 No hydroureteronephrosis. Urinary bladder is completely decompressed. Right adrenal gland is normal in appearance. Left adrenal gland is not confidently identified may be surgically absent. Stomach/Bowel: Normal appearance of the stomach. No pathologic dilatation of small bowel or colon. Numerous colonic diverticulae are noted, with some subtle surrounding inflammatory changes, which could be indicative of an acute diverticulitis, but are more likely related to mild edema in this patient with history of renal failure. The appendix is not confidently identified and may be surgically absent. Regardless, there are no inflammatory changes noted adjacent to the cecum to suggest the presence of an acute appendicitis at this time. Lymphatic: Multiple prominent borderline enlarged lymph nodes are noted throughout the pelvis measuring up to 9 mm in short axis, nonspecific. No pathologically enlarged lymph nodes identified in the abdomen or pelvis. Reproductive: Prostate gland and seminal vesicles are unremarkable in appearance. Other: Trace  volume of ascites.  No pneumoperitoneum. Musculoskeletal: Diffuse sclerosis throughout the visualized axial and appendicular skeleton, favored to be related to hyperparathyroidism in this patient with history of end-stage renal disease. Status post laminectomy from L3-L5. Review of the MIP images confirms the above findings. IMPRESSION: 1. No evidence of aortic dissection or acute aortic syndrome. 2. Trace left pleural effusion. 3. Aortic atherosclerosis, in addition to left main and 3 vessel coronary artery disease. Please note that although the presence of coronary artery calcium documents the presence of coronary artery disease, the severity of this disease and any potential stenosis cannot be assessed on this non-gated CT examination. Assessment for potential risk factor modification, dietary therapy or pharmacologic therapy may be warranted, if clinically indicated. 4. Dilatation of the pulmonic trunk (3.8 cm in diameter), concerning for pulmonary arterial hypertension. 5. There are calcifications of the aortic  valve. Echocardiographic correlation for evaluation of potential valvular dysfunction may be warranted if clinically indicated. 6. Colonic diverticulosis without definitive findings to suggest an acute diverticulitis at this time. There are subtle areas of stranding adjacent to the sigmoid colon which are favored to be related to a background state of mild soft tissue edema in this patient with history of end stage renal disease. 7. Additional incidental findings, as above. Electronically Signed   By: Vinnie Langton M.D.   On: 10/31/2018 14:34    Cardiac Studies   LHC/PCI (10/30/2018): 1. Severe three-vessel coronary artery disease with heavy calcification, including sequential 70% and 99% proximal and mid LAD stenoses (culprit lesions), long segment of irregular 50% distal LAD stenosis, 50% ostial LCx stenosis, severe disease involving diagonal branches and distal portion of OM2, and focal 90%  stenosis in the proximal rPDA. 2. Moderately to severely reduced left ventricular systolic function with mid and apical anterior, apical, and apical inferior hypokinesis/akinesis. LVEF approximately 35%. 3. Mildly elevated left ventricular filling pressure. 4. Successful PCI to proximal and mid LAD using overlapping Synergy 3.5 x 16 mm and 3.0 x 28 mm drug-eluting stents with less than 10% residual stenosis and TIMI-3 flow.  Echo (10/30/2018): 1. Left ventricular ejection fraction, by visual estimation, is 50 to 55%. The left ventricle has normal function. Normal left ventricular size. There is no left ventricular hypertrophy. 2. Definity contrast agent was given IV to delineate the left ventricular endocardial borders. 3. Left ventricular diastolic Doppler parameters are consistent with pseudonormalization pattern of LV diastolic filling. 4. There is apical akinesis, mid to apical anteroseptal akinesis. 5. Global right ventricle has normal systolic function.The right ventricular size is normal. Right vetricular wall thickness was not assessed. 6. Left atrial size was mild-moderately dilated. 7. Right atrial size was normal. 8. Mild calcification of the posterior mitral valve leaflet(s). 9. The mitral valve is degenerative. No evidence of mitral valve regurgitation. 10. The tricuspid valve is grossly normal. Tricuspid valve regurgitation was not visualized by color flow Doppler. 11. The aortic valve has an indeterminant number of cusps Aortic valve regurgitation was not visualized by color flow Doppler. Mild to moderate aortic valve sclerosis/calcification without any evidence of aortic stenosis. 12. The pulmonic valve was not well visualized. Pulmonic valve regurgitation is not visualized by color flow Doppler. 13. The inferior vena cava is normal in size with greater than 50% respiratory variability, suggesting right atrial pressure of 3 mmHg.  Patient Profile     63 y.o. male with  a history of ESRD secondary to FSGS, HTN, HLD, and chronic pain, admitted with anterior STEMI.  Assessment & Plan    STEMI --No current CP; however, continues to have concerning episodes of CP/SOB s/p last cath. Last reported episode occurred at 2:30AM with associated SOB. --CTA performed yesterday to exclude aortic pathology and negative. --EKG repeated yesterday with continued ST/T changes and patient now in atrial fibrillation as above with CP. --Scheduled for repeat catheterization with possible PCI to rPDA today at 11:30AM. --Risks and benefits of cardiac catheterization have been discussed with the patient.  These include bleeding, infection, kidney damage, stroke, heart attack, death.  The patient understands these risks and is willing to proceed. --Nursing staff and nephrology notified of plan for cath prior to HD today. --Continue ASA, ticagrelor, Coreg, statin. Losartan on hold 2/2 soft BP and hyperkalemia. Trend HS Tn until peaked and down-trending.  Ischemic cardiomyopathy --LVEF ~35% by LV-gram with echo 50-55%. Echo also shows apical akinesis, consistent with  LAD territory infarct. Likely that LVEF is reduced based on cath and echo. Continue Coreg. Losartan on hold 2/2 soft BP and hyperkalemia. HD after catheterization, and will defer volume management to nephrology.   ESRD --Nephrology contacted and informed of tentative plan for cardiac catheterization, so that able to coordinate with today's HD to occur after catheterization. Defer restarting losartan given soft BP and hyperkalemia with hopeful improvement in both after today's cath and  HD.   HLD --Continue statin therapy   For questions or updates, please contact [place] Please consult [email or web address] for contact info under        Signed, Arvil Chaco, PA-C  11/01/2018, 8:39 AM

## 2018-11-01 NOTE — Progress Notes (Signed)
Pt lying in bed resting comfortable at present. Pt.remains in Afib Hr 100-110. Pt denies any chest pin or heaviness. Pt continued on amiodarone/heparin/nitro. Tolerating well. Pt placed on 2lnc for comfort. Pt has remained NPO since midnight or CATH today.

## 2018-11-01 NOTE — Progress Notes (Signed)
CHIEF COMPLAINT:  No chief complaint on file.   Subjective  Chest pain afib with rvr Plan for cath today amio started     Objective     Review of Systems:  Gen:  Denies  fever, sweats, chills weight loss  HEENT: Denies blurred vision, double vision, ear pain, eye pain, hearing loss, nose bleeds, sore throat Cardiac:  + chest pain or heaviness, chest tightness,edema, No JVD Resp:   No cough, -sputum production, -shortness of breath,-wheezing, -hemoptysis,  Gi: Denies swallowing difficulty, stomach pain, nausea or vomiting, diarrhea, constipation, bowel incontinence Other:  All other systems negative  Examination:  General exam: +SOB Respiratory system: Clear to auscultation. Respiratory effort normal. HEENT: [place]/AT, PERRLA, no thrush, no stridor. Cardiovascular system: S1 & S2 heard, RRR. No JVD, murmurs, rubs, gallops or clicks. No pedal edema. Gastrointestinal system: Abdomen is nondistended, soft and nontender. No organomegaly or masses felt. Normal bowel sounds heard. Central nervous system: Alert and oriented. No focal neurological deficits. Extremities: Symmetric 5 x 5 power. Skin: No rashes, lesions or ulcers Psychiatry: Judgement and insight appear normal. Mood & affect appropriate.   VITALS:  height is 5\' 9"  (1.753 m) and weight is 86.5 kg. His oral temperature is 98.4 F (36.9 C). His blood pressure is 121/88 and his pulse is 73. His respiration is 11 and oxygen saturation is 94%.   I personally reviewed Labs under Results section.  Radiology Reports Ct Angio Chest/abd/pel For Dissection W And/or W/wo  Result Date: 10/31/2018 CLINICAL DATA:  63 year old male with history of acute onset of chest and back pain. Suspected aortic dissection. EXAM: CT ANGIOGRAPHY CHEST, ABDOMEN AND PELVIS TECHNIQUE: Multidetector CT imaging through the chest, abdomen and pelvis was performed using the standard protocol during bolus administration of intravenous contrast.  Multiplanar reconstructed images and MIPs were obtained and reviewed to evaluate the vascular anatomy. CONTRAST:  112mL OMNIPAQUE IOHEXOL 350 MG/ML SOLN COMPARISON:  CT the chest, abdomen and pelvis 06/26/2016. FINDINGS: CTA CHEST FINDINGS Cardiovascular: There is no crescentic high attenuation associated with the wall of the thoracic aorta on precontrast images to suggest acute intramural hematoma. No evidence of thoracic aortic aneurysm or dissection. Ascending thoracic aorta aortic arch and descending thoracic aorta measure 3.6 cm, 3.0 cm and 2.5 cm in diameter respectively. 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 There is no significant pericardial fluid, thickening or pericardial calcification. There is aortic atherosclerosis, as well as atherosclerosis of the great vessels of the mediastinum and the coronary arteries, including calcified atherosclerotic plaque in the left main, left anterior descending, left circumflex and right coronary arteries. Calcifications of the aortic valve. Dilatation of the pulmonic trunk (3.8 cm in diameter). Mediastinum/Nodes: No pathologically enlarged mediastinal or hilar lymph nodes. Esophagus is unremarkable in appearance. No axillary lymphadenopathy. Lungs/Pleura: Small calcified granuloma in the apex of the left upper lobe. 5 mm right upper lobe pulmonary nodule (axial image 78 of series 6), stable compared to the prior study, considered definitively benign. No other suspicious appearing pulmonary nodules or masses are noted. Linear scarring in the left lower lobe, similar to the prior study. Trace left pleural effusion, new compared to the prior study. No acute consolidative airspace disease. Musculoskeletal: Diffuse sclerosis throughout the visualized axial and appendicular skeleton, potentially  related to hyperparathyroidism in this patient with history of end stage renal disease. Review of the  MIP images confirms the above findings. CTA ABDOMEN AND PELVIS FINDINGS VASCULAR Aorta: Normal caliber aorta without aneurysm, dissection, vasculitis or significant stenosis. Celiac: Patent without evidence of aneurysm, dissection, vasculitis or significant stenosis. SMA: Patent without evidence of aneurysm, dissection, vasculitis or significant stenosis. Renals: Right renal artery is patent without evidence of aneurysm, dissection, vasculitis, fibromuscular dysplasia or significant stenosis. IMA: Patent without evidence of aneurysm, dissection, vasculitis or significant stenosis. Inflow: Patent without evidence of aneurysm, dissection, vasculitis or significant stenosis. Veins: No obvious venous abnormality within the limitations of this arterial phase study. Review of the MIP images confirms the above findings. NON-VASCULAR Hepatobiliary: No suspicious cystic or solid hepatic lesions are confidently identified on today's arterial phase examination. High attenuation lying dependently in the gallbladder, which presumably represents biliary sludge lying dependently. No surrounding inflammatory changes to suggest an acute cholecystitis at this time. Pancreas: No pancreatic mass. No pancreatic ductal dilatation. No pancreatic or peripancreatic fluid collections or inflammatory changes. Spleen: Status post splenectomy. Adrenals/Urinary Tract: Status post left nephrectomy. Right native kidney is severely atrophic with innumerable small low-attenuation lesions throughout the kidney, presumably cysts related to cystic disease of dialysis. 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 No hydroureteronephrosis. Urinary bladder is completely decompressed. Right adrenal gland is normal in appearance. Left adrenal gland is not confidently identified may be surgically absent.  Stomach/Bowel: Normal appearance of the stomach. No pathologic dilatation of small bowel or colon. Numerous colonic diverticulae are noted, with some subtle surrounding inflammatory changes, which could be indicative of an acute diverticulitis, but are more likely related to mild edema in this patient with history of renal failure. The appendix is not confidently identified and may be surgically absent. Regardless, there are no inflammatory changes noted adjacent to the cecum to suggest the presence of an acute appendicitis at this time. Lymphatic: Multiple prominent borderline enlarged lymph nodes are noted throughout the pelvis measuring up to 9 mm in short axis, nonspecific. No pathologically enlarged lymph nodes identified in the abdomen or pelvis. Reproductive: Prostate gland and seminal vesicles are unremarkable in appearance. Other: Trace volume of ascites.  No pneumoperitoneum. Musculoskeletal: Diffuse sclerosis throughout the visualized axial and appendicular skeleton, favored to be related to hyperparathyroidism in this patient with history of end-stage renal disease. Status post laminectomy from L3-L5. Review of the MIP images confirms the above findings. IMPRESSION: 1. No evidence of aortic dissection or acute aortic syndrome. 2. Trace left pleural effusion. 3. Aortic atherosclerosis, in addition to left main and 3 vessel coronary artery disease. Please note that although the presence of coronary artery calcium documents the presence of coronary artery disease, the severity of this disease and any potential stenosis cannot be assessed on this non-gated CT examination. Assessment for potential risk factor modification, dietary therapy or pharmacologic therapy may be warranted, if clinically indicated. 4. Dilatation of the pulmonic trunk (3.8 cm in diameter), concerning for pulmonary arterial hypertension. 5. There are calcifications of the aortic valve. Echocardiographic correlation for evaluation of  potential valvular dysfunction may be warranted if clinically indicated. 6. Colonic diverticulosis without definitive findings to suggest an acute diverticulitis at this time. There are subtle areas of stranding adjacent to the sigmoid colon which are favored to be related to a background state of mild soft tissue edema in this patient with history of end stage renal disease. 7. Additional incidental findings, as above. Electronically Signed   By: Vinnie Langton M.D.   On: 10/31/2018 14:34  Assessment/Plan:  ANTERIOR STEMI with ongoing angina Repeat cath today Follow up cardiology recs  afib with RVR Continue amiodarone infusion On heparin infusion     Corrin Parker, M.D.  [facility]  Medical Director [facility] Director [facility] Cardio-Pulmonary Department

## 2018-11-01 NOTE — Progress Notes (Signed)
This note also relates to the following rows which could not be included: Pulse Rate - Cannot attach notes to unvalidated device data Resp - Cannot attach notes to unvalidated device data BP - Cannot attach notes to unvalidated device data SpO2 - Cannot attach notes to unvalidated device data    11/01/18 1700  Vital Signs  Pulse Rate Source Monitor  During Hemodialysis Assessment  Blood Flow Rate (mL/min) 400 mL/min  Arterial Pressure (mmHg) -100 mmHg  Venous Pressure (mmHg) 110 mmHg  Transmembrane Pressure (mmHg) 50 mmHg  Ultrafiltration Rate (mL/min) 570 mL/min  Dialysate Flow Rate (mL/min) 600 ml/min  Conductivity: Machine  13.8  HD Safety Checks Performed Yes  Dialysis Fluid Bolus Normal Saline  Bolus Amount (mL) 250 mL  Intra-Hemodialysis Comments Tx completed  PT IN RUN VTACH HR 150s MD NOTIFIED TX TERMINATED..ICU MD AT BEDSIDE

## 2018-11-01 NOTE — Progress Notes (Signed)
   11/01/18 1555  Neurological  Level of Consciousness Alert  Orientation Level Oriented X4  Respiratory  Respiratory Pattern Regular;Unlabored  Cardiac  Pulse Regular  ECG Monitor Yes  HD at bedside no c/o pt stable watching TV avf+/+ ufg 1.5L

## 2018-11-01 NOTE — Progress Notes (Signed)
Pt went into sustained Vtach with rate in 150's. Pt c/o of moderate pressure in chest at the time and MD notified. Pt went to cath lab earlier in the day with PCI. HD was initiated approximately 15-20 min prior to arrhythmia.BP remained WNL throughout. Md came to bedside and assessed, STAT labs were drawn at that time. Pt converted on his own back to Afib without any interventions. Pt remains on maintenance dose of Amiodarone. Will continue to monitor.

## 2018-11-01 NOTE — Progress Notes (Signed)
ANTICOAGULATION CONSULT NOTE -   Pharmacy Consult for Heparin Indication: chest pain/ACS  Patient Measurements: Height: 5\' 9"  (175.3 cm) Weight: 190 lb 11.2 oz (86.5 kg) IBW/kg (Calculated) : 70.7 HEPARIN DW (KG): 88  Vital Signs: Temp: 97.6 F (36.4 C) (10/28 1041) Temp Source: Oral (10/28 1041) BP: 116/94 (10/28 1430) Pulse Rate: 92 (10/28 1430)  Labs: Recent Labs    10/30/18 0216 10/30/18 0510 10/30/18 0603  10/31/18 0440 10/31/18 2133 10/31/18 2240 10/31/18 2331 11/01/18 0557  HGB 13.5 12.8*  --   --  12.1*  --   --   --  11.1*  HCT 42.3 40.1  --   --  36.8*  --   --   --  32.8*  PLT 269 259  --   --  245  --   --   --  241  APTT 34  --  >160*  --   --   --  36  --   --   LABPROT 13.6  --   --   --  15.1  --  14.2  --   --   INR 1.1  --   --   --  1.2  --  1.1  --   --   HEPARINUNFRC  --   --   --   --   --   --   --   --  0.37  CREATININE 7.25* 7.35*  --   --  5.72*  --   --   --  7.10*  TROPONINIHS 132* 129*  --    < > 459* 442*  --  423*  --    < > = values in this interval not displayed.    Estimated Creatinine Clearance: 11.6 mL/min (A) (by C-G formula based on SCr of 7.1 mg/dL (H)).   Medical History: Past Medical History:  Diagnosis Date  . Bullous dermatosis   . Dialysis patient ([facility])   . Dysphagia   . FSGS (focal segmental glomerulosclerosis)   . Hyperlipidemia   . Renal insufficiency   . Spinal stenosis, cervical region     Medications:  Medications Prior to Admission  Medication Sig Dispense Refill Last Dose  . amLODipine (NORVASC) 5 MG tablet Take 5 mg by mouth daily.    10/29/2018 at 0900  . atorvastatin (LIPITOR) 40 MG tablet Take 20 mg by mouth daily.    10/29/2018 at 2100  . b complex-vitamin c-folic acid (NEPHRO-VITE) 0.8 MG TABS tablet Take 1 tablet by mouth at bedtime.   10/29/2018 at 2100  . calcium acetate (PHOSLO) 667 MG capsule Take 1,334 mg by mouth 3 (three) times daily with meals.    10/29/2018 at 1800  .  carboxymethylcellulose (REFRESH PLUS) 0.5 % SOLN 1 drop every 2 (two) hours while awake.   prn at prn  . carvedilol (COREG) 25 MG tablet Take 25 mg by mouth 2 (two) times daily with a meal.    10/29/2018 at 1800  . cetirizine (ZYRTEC) 10 MG tablet Take 10 mg by mouth daily.   10/29/2018 at 2100  . cinacalcet (SENSIPAR) 90 MG tablet Take 90 mg by mouth daily.   10/29/2018 at 0900  . cycloSPORINE (RESTASIS) 0.05 % ophthalmic emulsion Place 1 drop into both eyes 2 (two) times daily.   10/29/2018 at 2000  . docusate sodium (COLACE) 100 MG capsule Take 100 mg by mouth daily.   10/29/2018 at 0900  . dorzolamidel-timolol (COSOPT) 22.3-6.8 MG/ML SOLN ophthalmic solution 1 drop  2 (two) times daily.   10/29/2018 at 2100  . hydrALAZINE (APRESOLINE) 10 MG tablet Take 10 mg by mouth every 4 (four) hours as needed (systolic BP >294TMLY).    10/29/2018 at 1500  . hydrOXYzine (VISTARIL) 25 MG capsule Take 25 mg by mouth 3 (three) times daily as needed.   10/29/2018 at 2100  . ketoconazole (NIZORAL) 2 % shampoo Apply 1 application topically 2 (two) times a week.   Past Week at Unknown time  . lactulose (CHRONULAC) 10 GM/15ML solution Take by mouth 3 (three) times daily.   10/29/2018 at 2100  . latanoprost (XALATAN) 0.005 % ophthalmic solution Place 1 drop into both eyes at bedtime.   10/29/2018 at 2100  . losartan (COZAAR) 50 MG tablet Take 50 mg by mouth daily. Take one Tuesday, Thursday, Saturday, Sunday   10/29/2018 at 0900  . mirtazapine (REMERON) 15 MG tablet Take 15 mg by mouth at bedtime.   10/29/2018 at 2100  . omeprazole (PRILOSEC) 20 MG capsule Take 20 mg by mouth 2 (two) times daily before a meal.   10/29/2018 at 1700  . Psyllium (KONSYL-D PO) Take 5 mLs by mouth 2 (two) times daily.   10/29/2018 at 1800  . senna-docusate (SENOKOT-S) 8.6-50 MG tablet Take 1 tablet by mouth 2 (two) times daily as needed (to prevent constipation; scheduled until bowel movement).    10/29/2018 at 2100  . sertraline (ZOLOFT)  50 MG tablet Take 150 mg by mouth at bedtime.   10/29/2018 at 2100  . sevelamer carbonate (RENVELA) 0.8 g PACK packet Take 0.8 g by mouth 3 (three) times daily with meals.   10/29/2018 at 1800  . acetaminophen (TYLENOL) 325 MG tablet Take 650 mg by mouth every 8 (eight) hours as needed (pain or fever).    prn at prn  . betamethasone dipropionate (DIPROLENE) 0.05 % ointment Apply topically 2 (two) times daily.   prn at prn  . bisacodyl (DULCOLAX) 10 MG suppository Place 10 mg rectally daily as needed for moderate constipation.   prn at prn  . Carboxymethylcellul-Glycerin (REFRESH OPTIVE SENSITIVE OP) Place 1 drop into both eyes every 4 (four) hours.   prn at prn  . clobetasol ointment (TEMOVATE) 6.50 % Apply 1 application topically 2 (two) times daily.   prn at prn  . Melatonin 5 MG CAPS Take by mouth.   prn at prn  . methocarbamol (ROBAXIN) 500 MG tablet Take 500 mg by mouth 2 (two) times daily.    Completed Course at Unknown time  . mupirocin cream (BACTROBAN) 2 % Apply 1 application topically 2 (two) times daily. (Patient not taking: Reported on 10/30/2018) 30 g 0 Completed Course at Unknown time  . naloxone (NARCAN) nasal spray 4 mg/0.1 mL Place 1 spray into the nose.   prn at prn  . oxycodone (OXY-IR) 5 MG capsule Take 5-10 mg by mouth every 4 (four) hours as needed for pain.    Completed Course at Unknown time  . polyvinyl alcohol (LIQUIFILM TEARS) 1.4 % ophthalmic solution Place 1 drop into both eyes 4 (four) times daily as needed for dry eyes.   prn at prn  . terbinafine (LAMISIL) 1 % cream Apply 1 application topically 2 (two) times daily.   prn at prn   Assessment: Per NP note - Pt's EKG with ST Elevation in Inferior leads (appears more pronounced than on previous EKG) along with A-fib w/ RVR (HR 110-130).  Pt's high sensitivity troponin 442 (previously 459). Pharmacy asked to initiate  and monitor Heparin drip for ACS & A-fib. Baseline aPTT 36s, INR 1.1  Heparin Course: 10/27 initiation:  4000 unit bolus then 1000 units/hr 10/28 0557 HL 0.37: no change 10/28 1050 stopped for PCI  Goal of Therapy:  Heparin level 0.3-0.7 units/ml Monitor platelets by anticoagulation protocol: Yes   Plan:   Based on cardiology orders restart heparin infusion at 1000 units/hr beginning 8 hours after sheath removal (d/w Christina, RN who is aware)  Recheck heparin level 8 hours after restarting heparin  H&H trending lower, PLT stable: f/u CBC in am  Dallie Piles, PharmD 11/01/2018,2:37 PM

## 2018-11-01 NOTE — Progress Notes (Signed)
   11/01/18 1652  Respiratory  Respiratory Pattern Regular;Unlabored  Cardiac  Cardiac Rhythm VT (CN notified )  PT ALERT RN AND MD AT BEDSIDE HD [place] R/S FOR TOMORROW

## 2018-11-01 NOTE — Progress Notes (Signed)
[facility], [place] 11/01/18  Subjective:   LOS: 2   Intake/output 10/27 0701 - 10/28 0700 In: 204.7 [P.O.:180; I.V.:24.7] Out: -   Patient known to our practice from previous admissions He presented to the emergency room for complaints of chest pain and fever. Diagnosed with STEMI. Underwent emergent left heart catheterization and Found to have severe multivessel coronary disease with culprit lesion involving proximal and mid LAD which was successfully treated with 2 overlapping drug-eluting stents.  Chest pain significantly improved after the procedure.  Underwent successful HD on Monday. 1500 cc removed  Had heaviness last night along with difficulty breathing     Objective:  Vital signs in last 24 hours:  Temp:  [97.7 F (36.5 C)-98.4 F (36.9 C)] 98.4 F (36.9 C) (10/28 0100) Pulse Rate:  [59-76] 73 (10/27 1800) Resp:  [11-29] 11 (10/27 1800) BP: (97-139)/(70-93) 121/88 (10/27 1800) SpO2:  [94 %-98 %] 94 % (10/27 1800)  Weight change:  Filed Weights   10/30/18 0210 10/30/18 1400 10/30/18 1750  Weight: 88 kg 88 kg 86.5 kg    Intake/Output:    Intake/Output Summary (Last 24 hours) at 11/01/2018 0938 Last data filed at 10/31/2018 1833 Gross per 24 hour  Intake 204.65 ml  Output -  Net 204.65 ml     Physical Exam: General:  No acute distress, laying in bed  HEENT  anicteric, moist oral mucous membranes  Pulm/lungs  [place] O2. Mild basilar crackles  CVS/Heart  regular rhythm with ectopic beats  Abdomen:   Soft, nontender  Extremities:  Trace to 1+ edema  Neurologic:  Alert, oriented  Skin:  No acute rashes  Access:  Left forearm AV fistula       Basic Metabolic Panel:  Recent Labs  Lab 10/30/18 0216 10/30/18 0510 10/30/18 1516 10/31/18 0440 11/01/18 0557  NA 135 136  --  136 132*  K 4.9 4.8  --  4.4 5.6*  CL 94* 97*  --  96* 93*  CO2 25 24  --  26 24  GLUCOSE 104* 88  --  89 135*  BUN 60* 63*  --  42* 63*   CREATININE 7.25* 7.35*  --  5.72* 7.10*  CALCIUM 8.6* 8.1*  --  8.3* 8.1*  MG  --   --   --   --  1.8  PHOS  --   --  2.5 2.8 3.6     CBC: Recent Labs  Lab 10/30/18 0216 10/30/18 0510 10/31/18 0440 11/01/18 0557  WBC 12.7* 14.2* 11.1* 7.1  NEUTROABS 7.8*  --   --   --   HGB 13.5 12.8* 12.1* 11.1*  HCT 42.3 40.1 36.8* 32.8*  MCV 107.4* 106.4* 104.0* 101.5*  PLT 269 259 245 241      Lab Results  Component Value Date   HEPBSAG NON REACTIVE 10/30/2018      Microbiology:  Recent Results (from the past 240 hour(s))  Blood Culture (routine x 2)     Status: None (Preliminary result)   Collection Time: 10/30/18  2:16 AM   Specimen: BLOOD  Result Value Ref Range Status   Specimen Description BLOOD RIGHT FOREARM  Final   Special Requests   Final    BOTTLES DRAWN AEROBIC AND ANAEROBIC Blood Culture adequate volume   Culture   Final    NO GROWTH 2 DAYS Performed at [facility], [address] [place], [place] [address]    Report Status PENDING  Incomplete  Blood Culture (  routine x 2)     Status: None (Preliminary result)   Collection Time: 10/30/18  2:16 AM   Specimen: BLOOD  Result Value Ref Range Status   Specimen Description BLOOD RIGHT ANTECUBITAL  Final   Special Requests   Final    BOTTLES DRAWN AEROBIC AND ANAEROBIC Blood Culture adequate volume   Culture   Final    NO GROWTH 2 DAYS Performed at [facility], [address], [place], [place] [address]    Report Status PENDING  Incomplete  SARS Coronavirus 2 by RT PCR (hospital order, performed in [place] hospital lab) Nasopharyngeal Nasopharyngeal Swab     Status: None   Collection Time: 10/30/18  2:16 AM   Specimen: Nasopharyngeal Swab  Result Value Ref Range Status   SARS Coronavirus 2 NEGATIVE NEGATIVE Final    Comment: (NOTE) If result is NEGATIVE SARS-CoV-2 target nucleic acids are NOT DETECTED. The SARS-CoV-2 RNA is generally detectable in upper and lower  respiratory  specimens during the acute phase of infection. The lowest  concentration of SARS-CoV-2 viral copies this assay can detect is 250  copies / mL. A negative result does not preclude SARS-CoV-2 infection  and should not be used as the sole basis for treatment or other  patient management decisions.  A negative result may occur with  improper specimen collection / handling, submission of specimen other  than nasopharyngeal swab, presence of viral mutation(s) within the  areas targeted by this assay, and inadequate number of viral copies  (<250 copies / mL). A negative result must be combined with clinical  observations, patient history, and epidemiological information. If result is POSITIVE SARS-CoV-2 target nucleic acids are DETECTED. The SARS-CoV-2 RNA is generally detectable in upper and lower  respiratory specimens dur ing the acute phase of infection.  Positive  results are indicative of active infection with SARS-CoV-2.  Clinical  correlation with patient history and other diagnostic information is  necessary to determine patient infection status.  Positive results do  not rule out bacterial infection or co-infection with other viruses. If result is PRESUMPTIVE POSTIVE SARS-CoV-2 nucleic acids MAY BE PRESENT.   A presumptive positive result was obtained on the submitted specimen  and confirmed on repeat testing.  While 2019 novel coronavirus  (SARS-CoV-2) nucleic acids may be present in the submitted sample  additional confirmatory testing may be necessary for epidemiological  and / or clinical management purposes  to differentiate between  SARS-CoV-2 and other Sarbecovirus currently known to infect humans.  If clinically indicated additional testing with an alternate test  methodology [phone]) is advised. The SARS-CoV-2 RNA is generally  detectable in upper and lower respiratory sp ecimens during the acute  phase of infection. The expected result is Negative. Fact Sheet for  Patients:  StrictlyIdeas.no Fact Sheet for Healthcare Providers: BankingDealers.co.za This test is not yet approved or cleared by the [place] FDA and has been authorized for detection and/or diagnosis of SARS-CoV-2 by FDA under an Emergency Use Authorization (EUA).  This EUA will remain in effect (meaning this test can be used) for the duration of the COVID-19 declaration under Section 564(b)(1) of the Act, 21 U.S.C. section 360bbb-3(b)(1), unless the authorization is terminated or revoked sooner. Performed at [facility], [address] [place], [place] [address]   MRSA PCR Screening     Status: None   Collection Time: 10/30/18  5:45 AM   Specimen: Nasopharyngeal  Result Value Ref Range Status   MRSA by PCR NEGATIVE  NEGATIVE Final    Comment:        The GeneXpert MRSA Assay (FDA approved for NASAL specimens only), is one component of a comprehensive MRSA colonization surveillance program. It is not intended to diagnose MRSA infection nor to guide or monitor treatment for MRSA infections. Performed at [facility], [address] [place], [place] [address]     Coagulation Studies: Recent Labs    10/30/18 0216 10/31/18 0440 10/31/18 2240  LABPROT 13.6 15.1 14.2  INR 1.1 1.2 1.1    Urinalysis: No results for input(s): COLORURINE, LABSPEC, PHURINE, GLUCOSEU, HGBUR, BILIRUBINUR, KETONESUR, PROTEINUR, UROBILINOGEN, NITRITE, LEUKOCYTESUR in the last 72 hours.  Invalid input(s): APPERANCEUR    Imaging: Ct Angio Chest/abd/pel For Dissection W And/or W/wo  Result Date: 10/31/2018 CLINICAL DATA:  63 year old male with history of acute onset of chest and back pain. Suspected aortic dissection. EXAM: CT ANGIOGRAPHY CHEST, ABDOMEN AND PELVIS TECHNIQUE: Multidetector CT imaging through the chest, abdomen and pelvis was performed using the standard protocol during bolus administration of intravenous  contrast. Multiplanar reconstructed images and MIPs were obtained and reviewed to evaluate the vascular anatomy. CONTRAST:  170mL OMNIPAQUE IOHEXOL 350 MG/ML SOLN COMPARISON:  CT the chest, abdomen and pelvis 06/26/2016. FINDINGS: CTA CHEST FINDINGS Cardiovascular: There is no crescentic high attenuation associated with the wall of the thoracic aorta on precontrast images to suggest acute intramural hematoma. No evidence of thoracic aortic aneurysm or dissection. Ascending thoracic aorta aortic arch and descending thoracic aorta measure 3.6 cm, 3.0 cm and 2.5 cm in diameter respectively. 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 There is no significant pericardial fluid, thickening or pericardial calcification. There is aortic atherosclerosis, as well as atherosclerosis of the great vessels of the mediastinum and the coronary arteries, including calcified atherosclerotic plaque in the left main, left anterior descending, left circumflex and right coronary arteries. Calcifications of the aortic valve. Dilatation of the pulmonic trunk (3.8 cm in diameter). Mediastinum/Nodes: No pathologically enlarged mediastinal or hilar lymph nodes. Esophagus is unremarkable in appearance. No axillary lymphadenopathy. Lungs/Pleura: Small calcified granuloma in the apex of the left upper lobe. 5 mm right upper lobe pulmonary nodule (axial image 78 of series 6), stable compared to the prior study, considered definitively benign. No other suspicious appearing pulmonary nodules or masses are noted. Linear scarring in the left lower lobe, similar to the prior study. Trace left pleural effusion, new compared to the prior study. No acute consolidative airspace disease. Musculoskeletal: Diffuse sclerosis throughout the visualized axial and appendicular skeleton,  potentially related to hyperparathyroidism in this patient with history of end stage renal disease. Review of the MIP images confirms the above findings. CTA ABDOMEN AND PELVIS FINDINGS VASCULAR Aorta: Normal caliber aorta without aneurysm, dissection, vasculitis or significant stenosis. Celiac: Patent without evidence of aneurysm, dissection, vasculitis or significant stenosis. SMA: Patent without evidence of aneurysm, dissection, vasculitis or significant stenosis. Renals: Right renal artery is patent without evidence of aneurysm, dissection, vasculitis, fibromuscular dysplasia or significant stenosis. IMA: Patent without evidence of aneurysm, dissection, vasculitis or significant stenosis. Inflow: Patent without evidence of aneurysm, dissection, vasculitis or significant stenosis. Veins: No obvious venous abnormality within the limitations of this arterial phase study. Review of the MIP images confirms the above findings. NON-VASCULAR Hepatobiliary: No suspicious cystic or solid hepatic lesions are confidently identified on today's arterial phase examination. High attenuation  lying dependently in the gallbladder, which presumably represents biliary sludge lying dependently. No surrounding inflammatory changes to suggest an acute cholecystitis at this time. Pancreas: No pancreatic mass. No pancreatic ductal dilatation. No pancreatic or peripancreatic fluid collections or inflammatory changes. Spleen: Status post splenectomy. Adrenals/Urinary Tract: Status post left nephrectomy. Right native kidney is severely atrophic with innumerable small low-attenuation lesions throughout the kidney, presumably cysts related to cystic disease of dialysis. 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 No hydroureteronephrosis. Urinary bladder is completely decompressed. Right adrenal gland is normal in appearance. Left adrenal gland is not confidently identified may be surgically  absent. Stomach/Bowel: Normal appearance of the stomach. No pathologic dilatation of small bowel or colon. Numerous colonic diverticulae are noted, with some subtle surrounding inflammatory changes, which could be indicative of an acute diverticulitis, but are more likely related to mild edema in this patient with history of renal failure. The appendix is not confidently identified and may be surgically absent. Regardless, there are no inflammatory changes noted adjacent to the cecum to suggest the presence of an acute appendicitis at this time. Lymphatic: Multiple prominent borderline enlarged lymph nodes are noted throughout the pelvis measuring up to 9 mm in short axis, nonspecific. No pathologically enlarged lymph nodes identified in the abdomen or pelvis. Reproductive: Prostate gland and seminal vesicles are unremarkable in appearance. Other: Trace volume of ascites.  No pneumoperitoneum. Musculoskeletal: Diffuse sclerosis throughout the visualized axial and appendicular skeleton, favored to be related to hyperparathyroidism in this patient with history of end-stage renal disease. Status post laminectomy from L3-L5. Review of the MIP images confirms the above findings. IMPRESSION: 1. No evidence of aortic dissection or acute aortic syndrome. 2. Trace left pleural effusion. 3. Aortic atherosclerosis, in addition to left main and 3 vessel coronary artery disease. Please note that although the presence of coronary artery calcium documents the presence of coronary artery disease, the severity of this disease and any potential stenosis cannot be assessed on this non-gated CT examination. Assessment for potential risk factor modification, dietary therapy or pharmacologic therapy may be warranted, if clinically indicated. 4. Dilatation of the pulmonic trunk (3.8 cm in diameter), concerning for pulmonary arterial hypertension. 5. There are calcifications of the aortic valve. Echocardiographic correlation for evaluation  of potential valvular dysfunction may be warranted if clinically indicated. 6. Colonic diverticulosis without definitive findings to suggest an acute diverticulitis at this time. There are subtle areas of stranding adjacent to the sigmoid colon which are favored to be related to a background state of mild soft tissue edema in this patient with history of end stage renal disease. 7. Additional incidental findings, as above. Electronically Signed   By: Vinnie Langton M.D.   On: 10/31/2018 14:34     Medications:   . sodium chloride 10 mL/hr at 10/30/18 0253  . sodium chloride    . sodium chloride    . amiodarone 30 mg/hr (11/01/18 0550)  . heparin 1,000 Units/hr (10/31/18 2248)  . magnesium sulfate bolus IVPB    . nitroGLYCERIN Stopped (11/01/18 0551)   . artificial tears   Both Eyes BID  . aspirin  81 mg Oral Daily  . atorvastatin  80 mg Oral Daily  . calcium acetate  1,334 mg Oral TID WC  . carvedilol  3.125 mg Oral BID WC  . Chlorhexidine Gluconate Cloth  6 each Topical Q0600  . cinacalcet  90 mg Oral Q breakfast  . cycloSPORINE  1 drop Both Eyes  BID  . diphenhydrAMINE  50 mg Oral Once   Or  . diphenhydrAMINE  50 mg Intravenous Once  . docusate sodium  100 mg Oral Daily  . dorzolamide-timolol  1 drop Both Eyes BID  . feeding supplement (NEPRO CARB STEADY)  237 mL Oral BID BM  . hydroxypropyl methylcellulose / hypromellose  1 drop Both Eyes Q4H while awake  . latanoprost  1 drop Both Eyes QHS  . loratadine  10 mg Oral Daily  . mirtazapine  15 mg Oral QHS  . multivitamin  1 tablet Oral QHS  . pantoprazole  40 mg Oral Daily  . predniSONE  50 mg Oral Q6H  . sertraline  150 mg Oral QHS  . sevelamer carbonate  0.8 g Oral TID WC  . sodium chloride flush  3 mL Intravenous Q12H  . ticagrelor  90 mg Oral BID   sodium chloride, acetaminophen, acetaminophen, Melatonin, methocarbamol, midodrine, morphine injection, nitroGLYCERIN, ondansetron (ZOFRAN) IV, oxyCODONE, senna-docusate, sodium  chloride flush  Assessment/ Plan:  63 y.o. male with ESRD, dialysis for almost 30 years, during the patient, recent cervical decompression, autoimmune skin disease, STEMI s/p PCI 2 DES to LAD on 10/30/2018 is admitted for:  Principal Problem:   STEMI (ST elevation myocardial infarction) ([place]) Active Problems:   STEMI involving left anterior descending coronary artery ([place])  [facility] [place]/88 kg/ MWF/ 3.45 min. Left arm AVF  #. ESRD with mild hyperkalemia Patient is critically ill with recent STEMI s/p PCI 10/26 Iv contrast exposure for CT angio on 10/27 and later today for repeat heart cath Plan for HD later today after angiography Hyperkalemia is expected to correct with hemodialysis but can use IV bicarbonate along with insulin and D50 if necessary prior to procedure  #. Anemia of CKD  Lab Results  Component Value Date   HGB 11.1 (L) 11/01/2018  Hold Epogen because of ongoing acute coronary syndrome  #. SHPTH  No results found for: PTH Lab Results  Component Value Date   PHOS 3.6 11/01/2018  Monitor phosphorus during hospitalization  # STEMI Underwent emergent PCI 10/26.  Severe multivessel coronary disease with culprit lesion involving proximal and mid LAD treated with 2 drug-eluting stents.  Currently being monitored in the ICU -Repeat coronary angiography later today    LOS: [place] 10/28/20209:38 [place], [place]

## 2018-11-01 NOTE — Consult Note (Signed)
PHARMACY CONSULT NOTE  Pharmacy Consult for Electrolyte Monitoring and Replacement   Recent Labs: Potassium (mmol/L)  Date Value  11/01/2018 5.6 (H)  06/16/2013 4.4   Magnesium (mg/dL)  Date Value  11/01/2018 1.8   Calcium (mg/dL)  Date Value  11/01/2018 8.1 (L)   Calcium, Total (mg/dL)  Date Value  06/16/2013 8.9   Albumin (g/dL)  Date Value  11/01/2018 3.3 (L)  06/16/2013 3.3 (L)   Phosphorus (mg/dL)  Date Value  11/01/2018 3.6   Sodium (mmol/L)  Date Value  11/01/2018 132 (L)  06/16/2013 141   Corrected Ca: 8.66 mg/dL  Assessment: 63 y.o. male with a history of ESRD secondary to FSGS, HTN, HLD, and chronic pain, admitted with anterior STEMI  Goals of Therapy:  Potassium 4.0 - 5.1 mmol/L Magnesium 2.0 - 2.4 mg/dL All other electrolytes WNL  Plan:   Replace magnesium with 2 grams IV magnesium sulfate  Discussed hyperkalemia with cardiology who wished to proceed with PCI prior to correction. HD is scheduled immediately after PCI  Replace magnesium with 2 grams IV magnesium sulfate  Re-check electrolytes in am  Dallie Piles ,PharmD Clinical Pharmacist 11/01/2018 9:06 AM

## 2018-11-01 NOTE — Progress Notes (Signed)
This note also relates to the following rows which could not be included: Pulse Rate - Cannot attach notes to unvalidated device data Resp - Cannot attach notes to unvalidated device data SpO2 - Cannot attach notes to unvalidated device data    11/01/18 1650  Vital Signs  Pulse Rate Source Monitor  PT EATING PT STATES IM OK MONITOR IN VTACH 150S hr PRIMARY rn CALLED IN

## 2018-11-01 NOTE — Interval H&P Note (Signed)
History and Physical Interval Note:  11/01/2018 11:52 AM  William Carpenter  has presented today for cardiac catheterization, with the diagnosis of STEMI with continued chest pain.  The various methods of treatment have been discussed with the patient and family. After consideration of risks, benefits and other options for treatment, the patient has consented to  Procedure(s): RIGHT/LEFT HEART CATH AND CORONARY ANGIOGRAPHY (N/A) as a surgical intervention.  The patient's history has been reviewed, patient examined, no change in status, stable for surgery.  I have reviewed the patient's chart and labs.  Questions were answered to the patient's satisfaction.    Cath Lab Visit (complete for each Cath Lab visit)  Clinical Evaluation Leading to the Procedure:   ACS: Yes.    Non-ACS:  N/A  Makailah Slavick

## 2018-11-01 NOTE — H&P (View-Only) (Signed)
Progress Note  Patient Name: William Carpenter Date of Encounter: 11/01/2018  Primary Cardiologist: [facility], Dr. Saunders Revel  Subjective   Patient reports that he had shortness of breath and chest discomfort that occurred around 2:30 AM, causing him to need to sit up straight in a chair for at least an hour.  He denies any further episodes since that time.  He is currently resting comfortably in the bed without complaints.  He denies any further chest pain, racing heart rate, palpitations, shortness of breath, or myalgias. He consents today for repeat cath as outlined below.  Inpatient Medications    Scheduled Meds:  artificial tears   Both Eyes BID   aspirin  81 mg Oral Daily   atorvastatin  80 mg Oral Daily   calcium acetate  1,334 mg Oral TID WC   carvedilol  3.125 mg Oral BID WC   Chlorhexidine Gluconate Cloth  6 each Topical Q0600   cinacalcet  90 mg Oral Q breakfast   cycloSPORINE  1 drop Both Eyes BID   diphenhydrAMINE  50 mg Oral Once   Or   diphenhydrAMINE  50 mg Intravenous Once   docusate sodium  100 mg Oral Daily   dorzolamide-timolol  1 drop Both Eyes BID   feeding supplement (NEPRO CARB STEADY)  237 mL Oral BID BM   hydroxypropyl methylcellulose / hypromellose  1 drop Both Eyes Q4H while awake   latanoprost  1 drop Both Eyes QHS   loratadine  10 mg Oral Daily   mirtazapine  15 mg Oral QHS   multivitamin  1 tablet Oral QHS   pantoprazole  40 mg Oral Daily   predniSONE  50 mg Oral Q6H   sertraline  150 mg Oral QHS   sevelamer carbonate  0.8 g Oral TID WC   sodium chloride flush  3 mL Intravenous Q12H   ticagrelor  90 mg Oral BID   Continuous Infusions:  sodium chloride 10 mL/hr at 10/30/18 0253   sodium chloride     sodium chloride     amiodarone 30 mg/hr (11/01/18 0550)   heparin 1,000 Units/hr (10/31/18 2248)   nitroGLYCERIN Stopped (11/01/18 0551)   PRN Meds: sodium chloride, acetaminophen, acetaminophen, Melatonin,  methocarbamol, midodrine, morphine injection, nitroGLYCERIN, ondansetron (ZOFRAN) IV, oxyCODONE, senna-docusate, sodium chloride flush   Vital Signs    Vitals:   10/31/18 1758 10/31/18 1800 10/31/18 2000 11/01/18 0100  BP:  121/88    Pulse: 74 73    Resp: 13 11    Temp: 97.7 F (36.5 C)  98.3 F (36.8 C) 98.4 F (36.9 C)  TempSrc: Axillary  Oral Oral  SpO2: 96% 94%    Weight:      Height:        Intake/Output Summary (Last 24 hours) at 11/01/2018 0839 Last data filed at 10/31/2018 1833 Gross per 24 hour  Intake 204.65 ml  Output --  Net 204.65 ml   Last 3 Weights 10/30/2018 10/30/2018 10/30/2018  Weight (lbs) 190 lb 11.2 oz 194 lb 0.1 oz 194 lb 0.1 oz  Weight (kg) 86.5 kg 88 kg 88 kg      Telemetry    Afib, ventricular rates into the 130s- Personally Reviewed  ECG    Repeat EKG from yesterday shows atrial fibrillation with ventricular rate 111, inferior infarct, possible anteroseptal old infarct, LAD, poor conduction via lead I- Personally Reviewed  Physical Exam   GEN: No acute distress. Somnolent, having just woken for the day. Neck: No  JVD Cardiac:IRIR, no murmurs, rubs, or gallops.  Respiratory: Clear to auscultation bilaterally. XB:[id number], mildly distended  MS: Mild bilateral lower extremity edema; No deformity. Neuro:  Nonfocal  Psych: Normal affect, somnolent   Labs    High Sensitivity Troponin:   Recent Labs  Lab 10/30/18 1721 10/30/18 2316 10/31/18 0440 10/31/18 2133 10/31/18 2331  TROPONINIHS 318* 435* 459* 442* 423*      Cardiac EnzymesNo results for input(s): TROPONINI in the last 168 hours. No results for input(s): TROPIPOC in the last 168 hours.   Chemistry Recent Labs  Lab 10/30/18 0216 10/30/18 0510 10/31/18 0440 11/01/18 0557  NA 135 136 136 132*  K 4.9 4.8 4.4 5.6*  CL 94* 97* 96* 93*  CO2 25 24 26 24   GLUCOSE 104* 88 89 135*  BUN 60* 63* 42* 63*  CREATININE 7.25* 7.35* 5.72* 7.10*  CALCIUM 8.6* 8.1* 8.3* 8.1*  PROT  9.3*  --   --   --   ALBUMIN 3.8  --  3.2* 3.3*  AST 26  --   --   --   ALT 18  --   --   --   ALKPHOS 212*  --   --   --   BILITOT 1.7*  --   --   --   GFRNONAA 7* 7* 10* 7*  GFRAA 8* 8* 11* 9*  ANIONGAP 16* 15 14 15      Hematology Recent Labs  Lab 10/30/18 0510 10/31/18 0440 11/01/18 0557  WBC 14.2* 11.1* 7.1  RBC 3.77* 3.54* 3.23*  HGB 12.8* 12.1* 11.1*  HCT 40.1 36.8* 32.8*  MCV 106.4* 104.0* 101.5*  MCH 34.0 34.2* 34.4*  MCHC 31.9 32.9 33.8  RDW 13.9 14.2 14.0  PLT 259 245 241    BNPNo results for input(s): BNP, PROBNP in the last 168 hours.   DDimer No results for input(s): DDIMER in the last 168 hours.   Radiology    Ct Angio Chest/abd/pel For Dissection W And/or W/wo  Result Date: 10/31/2018 CLINICAL DATA:  63 year old male with history of acute onset of chest and back pain. Suspected aortic dissection. EXAM: CT ANGIOGRAPHY CHEST, ABDOMEN AND PELVIS TECHNIQUE: Multidetector CT imaging through the chest, abdomen and pelvis was performed using the standard protocol during bolus administration of intravenous contrast. Multiplanar reconstructed images and MIPs were obtained and reviewed to evaluate the vascular anatomy. CONTRAST:  129mL OMNIPAQUE IOHEXOL 350 MG/ML SOLN COMPARISON:  CT the chest, abdomen and pelvis 06/26/2016. FINDINGS: CTA CHEST FINDINGS Cardiovascular: There is no crescentic high attenuation associated with the wall of the thoracic aorta on precontrast images to suggest acute intramural hematoma. No evidence of thoracic aortic aneurysm or dissection. Ascending thoracic aorta aortic arch and descending thoracic aorta measure 3.6 cm, 3.0 cm and 2.5 cm in diameter respectively. 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 There is no significant pericardial fluid, thickening or  pericardial calcification. There is aortic atherosclerosis, as well as atherosclerosis of the great vessels of the mediastinum and the coronary arteries, including calcified atherosclerotic plaque in the left main, left anterior descending, left circumflex and right coronary arteries. Calcifications of the aortic valve. Dilatation of the pulmonic trunk (3.8 cm in diameter). Mediastinum/Nodes: No pathologically enlarged mediastinal or hilar lymph nodes. Esophagus is unremarkable in appearance. No axillary lymphadenopathy. Lungs/Pleura: Small calcified granuloma in the apex of the left upper  lobe. 5 mm right upper lobe pulmonary nodule (axial image 78 of series 6), stable compared to the prior study, considered definitively benign. No other suspicious appearing pulmonary nodules or masses are noted. Linear scarring in the left lower lobe, similar to the prior study. Trace left pleural effusion, new compared to the prior study. No acute consolidative airspace disease. Musculoskeletal: Diffuse sclerosis throughout the visualized axial and appendicular skeleton, potentially related to hyperparathyroidism in this patient with history of end stage renal disease. Review of the MIP images confirms the above findings. CTA ABDOMEN AND PELVIS FINDINGS VASCULAR Aorta: Normal caliber aorta without aneurysm, dissection, vasculitis or significant stenosis. Celiac: Patent without evidence of aneurysm, dissection, vasculitis or significant stenosis. SMA: Patent without evidence of aneurysm, dissection, vasculitis or significant stenosis. Renals: Right renal artery is patent without evidence of aneurysm, dissection, vasculitis, fibromuscular dysplasia or significant stenosis. IMA: Patent without evidence of aneurysm, dissection, vasculitis or significant stenosis. Inflow: Patent without evidence of aneurysm, dissection, vasculitis or significant stenosis. Veins: No obvious venous abnormality within the limitations of this arterial  phase study. Review of the MIP images confirms the above findings. NON-VASCULAR Hepatobiliary: No suspicious cystic or solid hepatic lesions are confidently identified on today's arterial phase examination. High attenuation lying dependently in the gallbladder, which presumably represents biliary sludge lying dependently. No surrounding inflammatory changes to suggest an acute cholecystitis at this time. Pancreas: No pancreatic mass. No pancreatic ductal dilatation. No pancreatic or peripancreatic fluid collections or inflammatory changes. Spleen: Status post splenectomy. Adrenals/Urinary Tract: Status post left nephrectomy. Right native kidney is severely atrophic with innumerable small low-attenuation lesions throughout the kidney, presumably cysts related to cystic disease of dialysis. 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 No hydroureteronephrosis. Urinary bladder is completely decompressed. Right adrenal gland is normal in appearance. Left adrenal gland is not confidently identified may be surgically absent. Stomach/Bowel: Normal appearance of the stomach. No pathologic dilatation of small bowel or colon. Numerous colonic diverticulae are noted, with some subtle surrounding inflammatory changes, which could be indicative of an acute diverticulitis, but are more likely related to mild edema in this patient with history of renal failure. The appendix is not confidently identified and may be surgically absent. Regardless, there are no inflammatory changes noted adjacent to the cecum to suggest the presence of an acute appendicitis at this time. Lymphatic: Multiple prominent borderline enlarged lymph nodes are noted throughout the pelvis measuring up to 9 mm in short axis, nonspecific. No pathologically enlarged lymph nodes identified in the abdomen or pelvis. Reproductive: Prostate gland and seminal vesicles are unremarkable in appearance. Other: Trace  volume of ascites.  No pneumoperitoneum. Musculoskeletal: Diffuse sclerosis throughout the visualized axial and appendicular skeleton, favored to be related to hyperparathyroidism in this patient with history of end-stage renal disease. Status post laminectomy from L3-L5. Review of the MIP images confirms the above findings. IMPRESSION: 1. No evidence of aortic dissection or acute aortic syndrome. 2. Trace left pleural effusion. 3. Aortic atherosclerosis, in addition to left main and 3 vessel coronary artery disease. Please note that although the presence of coronary artery calcium documents the presence of coronary artery disease, the severity of this disease and any potential stenosis cannot be assessed on this non-gated CT examination. Assessment for potential risk factor modification, dietary therapy or pharmacologic therapy may be warranted, if clinically indicated. 4. Dilatation of the pulmonic trunk (3.8 cm in diameter), concerning for pulmonary arterial hypertension. 5. There are calcifications of the aortic  valve. Echocardiographic correlation for evaluation of potential valvular dysfunction may be warranted if clinically indicated. 6. Colonic diverticulosis without definitive findings to suggest an acute diverticulitis at this time. There are subtle areas of stranding adjacent to the sigmoid colon which are favored to be related to a background state of mild soft tissue edema in this patient with history of end stage renal disease. 7. Additional incidental findings, as above. Electronically Signed   By: Vinnie Langton M.D.   On: 10/31/2018 14:34    Cardiac Studies   LHC/PCI (10/30/2018): 1. Severe three-vessel coronary artery disease with heavy calcification, including sequential 70% and 99% proximal and mid LAD stenoses (culprit lesions), long segment of irregular 50% distal LAD stenosis, 50% ostial LCx stenosis, severe disease involving diagonal branches and distal portion of OM2, and focal 90%  stenosis in the proximal rPDA. 2. Moderately to severely reduced left ventricular systolic function with mid and apical anterior, apical, and apical inferior hypokinesis/akinesis. LVEF approximately 35%. 3. Mildly elevated left ventricular filling pressure. 4. Successful PCI to proximal and mid LAD using overlapping Synergy 3.5 x 16 mm and 3.0 x 28 mm drug-eluting stents with less than 10% residual stenosis and TIMI-3 flow.  Echo (10/30/2018): 1. Left ventricular ejection fraction, by visual estimation, is 50 to 55%. The left ventricle has normal function. Normal left ventricular size. There is no left ventricular hypertrophy. 2. Definity contrast agent was given IV to delineate the left ventricular endocardial borders. 3. Left ventricular diastolic Doppler parameters are consistent with pseudonormalization pattern of LV diastolic filling. 4. There is apical akinesis, mid to apical anteroseptal akinesis. 5. Global right ventricle has normal systolic function.The right ventricular size is normal. Right vetricular wall thickness was not assessed. 6. Left atrial size was mild-moderately dilated. 7. Right atrial size was normal. 8. Mild calcification of the posterior mitral valve leaflet(s). 9. The mitral valve is degenerative. No evidence of mitral valve regurgitation. 10. The tricuspid valve is grossly normal. Tricuspid valve regurgitation was not visualized by color flow Doppler. 11. The aortic valve has an indeterminant number of cusps Aortic valve regurgitation was not visualized by color flow Doppler. Mild to moderate aortic valve sclerosis/calcification without any evidence of aortic stenosis. 12. The pulmonic valve was not well visualized. Pulmonic valve regurgitation is not visualized by color flow Doppler. 13. The inferior vena cava is normal in size with greater than 50% respiratory variability, suggesting right atrial pressure of 3 mmHg.  Patient Profile     63 y.o. male with  a history of ESRD secondary to FSGS, HTN, HLD, and chronic pain, admitted with anterior STEMI.  Assessment & Plan    STEMI --No current CP; however, continues to have concerning episodes of CP/SOB s/p last cath. Last reported episode occurred at 2:30AM with associated SOB. --CTA performed yesterday to exclude aortic pathology and negative. --EKG repeated yesterday with continued ST/T changes and patient now in atrial fibrillation as above with CP. --Scheduled for repeat catheterization with possible PCI to rPDA today at 11:30AM. --Risks and benefits of cardiac catheterization have been discussed with the patient.  These include bleeding, infection, kidney damage, stroke, heart attack, death.  The patient understands these risks and is willing to proceed. --Nursing staff and nephrology notified of plan for cath prior to HD today. --Continue ASA, ticagrelor, Coreg, statin. Losartan on hold 2/2 soft BP and hyperkalemia. Trend HS Tn until peaked and down-trending.  Ischemic cardiomyopathy --LVEF ~35% by LV-gram with echo 50-55%. Echo also shows apical akinesis, consistent with  LAD territory infarct. Likely that LVEF is reduced based on cath and echo. Continue Coreg. Losartan on hold 2/2 soft BP and hyperkalemia. HD after catheterization, and will defer volume management to nephrology.   ESRD --Nephrology contacted and informed of tentative plan for cardiac catheterization, so that able to coordinate with today's HD to occur after catheterization. Defer restarting losartan given soft BP and hyperkalemia with hopeful improvement in both after today's cath and  HD.   HLD --Continue statin therapy   For questions or updates, please contact [place] Please consult [email or web address] for contact info under        Signed, Arvil Chaco, PA-C  11/01/2018, 8:39 AM

## 2018-11-01 NOTE — Progress Notes (Signed)
ANTICOAGULATION CONSULT NOTE -   Pharmacy Consult for Heparin Indication: chest pain/ACS  Patient Measurements: Height: 5\' 9"  (175.3 cm) Weight: 190 lb 11.2 oz (86.5 kg) IBW/kg (Calculated) : 70.7 HEPARIN DW (KG): 88  Vital Signs: Temp: 97.6 F (36.4 C) (10/28 1041) Temp Source: Oral (10/28 1041) BP: 116/94 (10/28 1430) Pulse Rate: 92 (10/28 1430)  Labs: Recent Labs    10/30/18 0216 10/30/18 0510 10/30/18 0603  10/31/18 0440 10/31/18 2133 10/31/18 2240 10/31/18 2331 11/01/18 0557  HGB 13.5 12.8*  --   --  12.1*  --   --   --  11.1*  HCT 42.3 40.1  --   --  36.8*  --   --   --  32.8*  PLT 269 259  --   --  245  --   --   --  241  APTT 34  --  >160*  --   --   --  36  --   --   LABPROT 13.6  --   --   --  15.1  --  14.2  --   --   INR 1.1  --   --   --  1.2  --  1.1  --   --   HEPARINUNFRC  --   --   --   --   --   --   --   --  0.37  CREATININE 7.25* 7.35*  --   --  5.72*  --   --   --  7.10*  TROPONINIHS 132* 129*  --    < > 459* 442*  --  423*  --    < > = values in this interval not displayed.    Estimated Creatinine Clearance: 11.6 mL/min (A) (by C-G formula based on SCr of 7.1 mg/dL (H)).   Medical History: Past Medical History:  Diagnosis Date  . Bullous dermatosis   . Dialysis patient ([place])   . Dysphagia   . FSGS (focal segmental glomerulosclerosis)   . Hyperlipidemia   . Renal insufficiency   . Spinal stenosis, cervical region     Medications:  Medications Prior to Admission  Medication Sig Dispense Refill Last Dose  . amLODipine (NORVASC) 5 MG tablet Take 5 mg by mouth daily.    10/29/2018 at 0900  . atorvastatin (LIPITOR) 40 MG tablet Take 20 mg by mouth daily.    10/29/2018 at 2100  . b complex-vitamin c-folic acid (NEPHRO-VITE) 0.8 MG TABS tablet Take 1 tablet by mouth at bedtime.   10/29/2018 at 2100  . calcium acetate (PHOSLO) 667 MG capsule Take 1,334 mg by mouth 3 (three) times daily with meals.    10/29/2018 at 1800  .  carboxymethylcellulose (REFRESH PLUS) 0.5 % SOLN 1 drop every 2 (two) hours while awake.   prn at prn  . carvedilol (COREG) 25 MG tablet Take 25 mg by mouth 2 (two) times daily with a meal.    10/29/2018 at 1800  . cetirizine (ZYRTEC) 10 MG tablet Take 10 mg by mouth daily.   10/29/2018 at 2100  . cinacalcet (SENSIPAR) 90 MG tablet Take 90 mg by mouth daily.   10/29/2018 at 0900  . cycloSPORINE (RESTASIS) 0.05 % ophthalmic emulsion Place 1 drop into both eyes 2 (two) times daily.   10/29/2018 at 2000  . docusate sodium (COLACE) 100 MG capsule Take 100 mg by mouth daily.   10/29/2018 at 0900  . dorzolamidel-timolol (COSOPT) 22.3-6.8 MG/ML SOLN ophthalmic solution 1 drop  2 (two) times daily.   10/29/2018 at 2100  . hydrALAZINE (APRESOLINE) 10 MG tablet Take 10 mg by mouth every 4 (four) hours as needed (systolic BP >062IRSW).    10/29/2018 at 1500  . hydrOXYzine (VISTARIL) 25 MG capsule Take 25 mg by mouth 3 (three) times daily as needed.   10/29/2018 at 2100  . ketoconazole (NIZORAL) 2 % shampoo Apply 1 application topically 2 (two) times a week.   Past Week at Unknown time  . lactulose (CHRONULAC) 10 GM/15ML solution Take by mouth 3 (three) times daily.   10/29/2018 at 2100  . latanoprost (XALATAN) 0.005 % ophthalmic solution Place 1 drop into both eyes at bedtime.   10/29/2018 at 2100  . losartan (COZAAR) 50 MG tablet Take 50 mg by mouth daily. Take one Tuesday, Thursday, Saturday, Sunday   10/29/2018 at 0900  . mirtazapine (REMERON) 15 MG tablet Take 15 mg by mouth at bedtime.   10/29/2018 at 2100  . omeprazole (PRILOSEC) 20 MG capsule Take 20 mg by mouth 2 (two) times daily before a meal.   10/29/2018 at 1700  . Psyllium (KONSYL-D PO) Take 5 mLs by mouth 2 (two) times daily.   10/29/2018 at 1800  . senna-docusate (SENOKOT-S) 8.6-50 MG tablet Take 1 tablet by mouth 2 (two) times daily as needed (to prevent constipation; scheduled until bowel movement).    10/29/2018 at 2100  . sertraline (ZOLOFT)  50 MG tablet Take 150 mg by mouth at bedtime.   10/29/2018 at 2100  . sevelamer carbonate (RENVELA) 0.8 g PACK packet Take 0.8 g by mouth 3 (three) times daily with meals.   10/29/2018 at 1800  . acetaminophen (TYLENOL) 325 MG tablet Take 650 mg by mouth every 8 (eight) hours as needed (pain or fever).    prn at prn  . betamethasone dipropionate (DIPROLENE) 0.05 % ointment Apply topically 2 (two) times daily.   prn at prn  . bisacodyl (DULCOLAX) 10 MG suppository Place 10 mg rectally daily as needed for moderate constipation.   prn at prn  . Carboxymethylcellul-Glycerin (REFRESH OPTIVE SENSITIVE OP) Place 1 drop into both eyes every 4 (four) hours.   prn at prn  . clobetasol ointment (TEMOVATE) 5.46 % Apply 1 application topically 2 (two) times daily.   prn at prn  . Melatonin 5 MG CAPS Take by mouth.   prn at prn  . methocarbamol (ROBAXIN) 500 MG tablet Take 500 mg by mouth 2 (two) times daily.    Completed Course at Unknown time  . mupirocin cream (BACTROBAN) 2 % Apply 1 application topically 2 (two) times daily. (Patient not taking: Reported on 10/30/2018) 30 g 0 Completed Course at Unknown time  . naloxone (NARCAN) nasal spray 4 mg/0.1 mL Place 1 spray into the nose.   prn at prn  . oxycodone (OXY-IR) 5 MG capsule Take 5-10 mg by mouth every 4 (four) hours as needed for pain.    Completed Course at Unknown time  . polyvinyl alcohol (LIQUIFILM TEARS) 1.4 % ophthalmic solution Place 1 drop into both eyes 4 (four) times daily as needed for dry eyes.   prn at prn  . terbinafine (LAMISIL) 1 % cream Apply 1 application topically 2 (two) times daily.   prn at prn   Assessment: Per NP note - Pt's EKG with ST Elevation in Inferior leads (appears more pronounced than on previous EKG) along with A-fib w/ RVR (HR 110-130).  Pt's high sensitivity troponin 442 (previously 459). Pharmacy asked to initiate  and monitor Heparin drip for ACS & A-fib. Baseline aPTT 36s, INR 1.1  Heparin Course: 10/27 initiation:  4000 unit bolus then 1000 units/hr 10/28 0557 HL 0.37: no change 10/28 1050 stopped for PCI  Goal of Therapy:  Heparin level 0.3-0.7 units/ml Monitor platelets by anticoagulation protocol: Yes   Plan:   Based on cardiology orders restart heparin infusion at 1000 units/hr beginning 8 hours after sheath removal. Sheath was removed at approximately 1500, therefore heparin infusion will begin at 2300 (d/w Christina, RN who is aware)  Recheck heparin level 8 hours after restarting heparin  H&H trending lower, PLT stable: f/u CBC in am  Dallie Piles, PharmD 11/01/2018,4:00 PM

## 2018-11-01 NOTE — Progress Notes (Signed)
[place] at [place] NAME: William Carpenter    MR#:  [id number]  DATE OF BIRTH:  03/28/1955  SUBJECTIVE:   Patient having continued episodes of chest pain this morning. Denies any shortness of breath, diaphoresis, or nausea.  REVIEW OF SYSTEMS:  Review of Systems  Constitutional: Negative for chills and fever.  HENT: Negative for congestion and sore throat.   Eyes: Negative for blurred vision and double vision.  Respiratory: Negative for cough and shortness of breath.   Cardiovascular: Positive for chest pain. Negative for palpitations and leg swelling.  Gastrointestinal: Negative for nausea and vomiting.  Genitourinary: Negative for dysuria and urgency.  Musculoskeletal: Negative for back pain and neck pain.  Neurological: Negative for dizziness and headaches.  Psychiatric/Behavioral: Negative for depression. The patient is not nervous/anxious.     DRUG ALLERGIES:   Allergies  Allergen Reactions  . Albumin (Human) Hives  . Aloe   . Erythromycin Hives  . Iodinated Diagnostic Agents Hives  . Iron Dextran   . Metoprolol Tartrate   . Penicillins Swelling  . Quinine Sulfate [Quinine]   . Rabeprazole   . Sulfa Antibiotics Other (See Comments)  . Tape Other (See Comments)    Removes skin.  . Trimethoprim    VITALS:  Blood pressure (!) 116/94, pulse 92, temperature 97.6 F (36.4 C), temperature source Oral, resp. rate 18, height 5\' 9"  (1.753 m), weight 86.5 kg, SpO2 100 %. PHYSICAL EXAMINATION:  Physical Exam  GENERAL:  Laying in the bed with no acute distress.  HEENT: Head atraumatic, normocephalic. Pupils equal, round, reactive to light and accommodation. No scleral icterus. Extraocular muscles intact. Oropharynx and nasopharynx clear.  NECK:  Supple, no jugular venous distention. No thyroid enlargement. LUNGS: Lungs are clear to auscultation bilaterally. No wheezes, crackles, rhonchi. No use of accessory muscles of  respiration.  CARDIOVASCULAR: RRR, S1, S2 normal. No murmurs, rubs, or gallops.  ABDOMEN: Soft, nontender, nondistended. Bowel sounds present.  EXTREMITIES: No pedal edema, cyanosis, or clubbing.  NEUROLOGIC: CN 2-12 intact, no focal deficits. 5/5 muscle strength throughout all extremities. Sensation intact throughout. Gait not checked.  PSYCHIATRIC: The patient is alert and oriented x 3.  SKIN: No obvious rash, lesion, or ulcer.  LABORATORY PANEL:  Male CBC Recent Labs  Lab 11/01/18 0557  WBC 7.1  HGB 11.1*  HCT 32.8*  PLT 241   ------------------------------------------------------------------------------------------------------------------ Chemistries  Recent Labs  Lab 10/30/18 0216  11/01/18 0557  NA 135   < > 132*  K 4.9   < > 5.6*  CL 94*   < > 93*  CO2 25   < > 24  GLUCOSE 104*   < > 135*  BUN 60*   < > 63*  CREATININE 7.25*   < > 7.10*  CALCIUM 8.6*   < > 8.1*  MG  --   --  1.8  AST 26  --   --   ALT 18  --   --   ALKPHOS 212*  --   --   BILITOT 1.7*  --   --    < > = values in this interval not displayed.   RADIOLOGY:  No results found. ASSESSMENT AND PLAN:   Acute STEMI- having persistent episodes of chest pain. -s/p cardiac catheterization on 10/26 with DES to the proximal and mid LAD -repeat cardiac cath today with stable disease and patent stents; rPDA was stented -CTA chest 10/27 showed CAD, but no dissection or PE -  Cardiology following -Continue dual antiplatelet therapy with aspirin and brilinta for 12 months -ECHO with EF 50-55% -Continue Coreg and Lipitor -No ACE/ARB due to hypotension  A-fib with RVR- new, occurred overnight. -Continue heparin gtt and amiodarone gtt -Cardiac monitoring  Sepsis-initially meeting sepsis criteria on admission with fever and leukocytosis.  Sepsis has been ruled out with negative cultures. -Off antibiotics  ESRD on HD MWF -Nephrology following -Patient will receive HD today after cath  Hyperlipidemia-  LDL is 90 this admission -Continue home Lipitor  Hypertension- patient did have some low BPs earlier this admission, likely due to cardiogenic shock. -Continue midodrine -Continue coreg -Holding home hydralazine and losartan  All the records are reviewed and case discussed with Care Management/Social Worker. Management plans discussed with the patient, family and they are in agreement.  CODE STATUS: Full Code  TOTAL TIME TAKING CARE OF THIS PATIENT: 40 minutes.   More than 50% of the time was spent in counseling/coordination of care: YES  POSSIBLE D/C IN 1-2 DAYS, DEPENDING ON CLINICAL CONDITION.   Berna Spare Melitta Tigue M.D on 11/01/2018 at 3:07 PM  Between 7am to 6pm - Pager [phone]  After 6pm go to [email or web address] - Proofreader  [facility]  Office  [phone]  CC: Primary care physician; Raelyn Mora, MD  Note: This dictation was prepared with Dragon dictation along with smaller phrase technology. Any transcriptional errors that result from this process are unintentional.

## 2018-11-01 NOTE — Progress Notes (Signed)
ANTICOAGULATION CONSULT NOTE - Initial Consult  Pharmacy Consult for Heparin Indication: chest pain/ACS  Patient Measurements: Height: 5\' 9"  (175.3 cm) Weight: 190 lb 11.2 oz (86.5 kg) IBW/kg (Calculated) : 70.7 HEPARIN DW (KG): 88  Vital Signs: Temp: 98.4 F (36.9 C) (10/28 0100) Temp Source: Oral (10/28 0100)  Labs: Recent Labs    10/30/18 0216 10/30/18 0510 10/30/18 0603  10/31/18 0440 10/31/18 2133 10/31/18 2240 10/31/18 2331 11/01/18 0557  HGB 13.5 12.8*  --   --  12.1*  --   --   --  11.1*  HCT 42.3 40.1  --   --  36.8*  --   --   --  32.8*  PLT 269 259  --   --  245  --   --   --  241  APTT 34  --  >160*  --   --   --  36  --   --   LABPROT 13.6  --   --   --  15.1  --  14.2  --   --   INR 1.1  --   --   --  1.2  --  1.1  --   --   HEPARINUNFRC  --   --   --   --   --   --   --   --  0.37  CREATININE 7.25* 7.35*  --   --  5.72*  --   --   --  7.10*  TROPONINIHS 132* 129*  --    < > 459* 442*  --  423*  --    < > = values in this interval not displayed.    Estimated Creatinine Clearance: 11.6 mL/min (A) (by C-G formula based on SCr of 7.1 mg/dL (H)).   Medical History: Past Medical History:  Diagnosis Date  . Bullous dermatosis   . Dialysis patient ([facility])   . Dysphagia   . FSGS (focal segmental glomerulosclerosis)   . Hyperlipidemia   . Renal insufficiency   . Spinal stenosis, cervical region     Medications:  Medications Prior to Admission  Medication Sig Dispense Refill Last Dose  . amLODipine (NORVASC) 5 MG tablet Take 5 mg by mouth daily.    10/29/2018 at 0900  . atorvastatin (LIPITOR) 40 MG tablet Take 20 mg by mouth daily.    10/29/2018 at 2100  . b complex-vitamin c-folic acid (NEPHRO-VITE) 0.8 MG TABS tablet Take 1 tablet by mouth at bedtime.   10/29/2018 at 2100  . calcium acetate (PHOSLO) 667 MG capsule Take 1,334 mg by mouth 3 (three) times daily with meals.    10/29/2018 at 1800  . carboxymethylcellulose (REFRESH PLUS) 0.5 % SOLN 1 drop  every 2 (two) hours while awake.   prn at prn  . carvedilol (COREG) 25 MG tablet Take 25 mg by mouth 2 (two) times daily with a meal.    10/29/2018 at 1800  . cetirizine (ZYRTEC) 10 MG tablet Take 10 mg by mouth daily.   10/29/2018 at 2100  . cinacalcet (SENSIPAR) 90 MG tablet Take 90 mg by mouth daily.   10/29/2018 at 0900  . cycloSPORINE (RESTASIS) 0.05 % ophthalmic emulsion Place 1 drop into both eyes 2 (two) times daily.   10/29/2018 at 2000  . docusate sodium (COLACE) 100 MG capsule Take 100 mg by mouth daily.   10/29/2018 at 0900  . dorzolamidel-timolol (COSOPT) 22.3-6.8 MG/ML SOLN ophthalmic solution 1 drop 2 (two) times daily.   10/29/2018 at  2100  . hydrALAZINE (APRESOLINE) 10 MG tablet Take 10 mg by mouth every 4 (four) hours as needed (systolic BP >[id number]).    10/29/2018 at 1500  . hydrOXYzine (VISTARIL) 25 MG capsule Take 25 mg by mouth 3 (three) times daily as needed.   10/29/2018 at 2100  . ketoconazole (NIZORAL) 2 % shampoo Apply 1 application topically 2 (two) times a week.   Past Week at Unknown time  . lactulose (CHRONULAC) 10 GM/15ML solution Take by mouth 3 (three) times daily.   10/29/2018 at 2100  . latanoprost (XALATAN) 0.005 % ophthalmic solution Place 1 drop into both eyes at bedtime.   10/29/2018 at 2100  . losartan (COZAAR) 50 MG tablet Take 50 mg by mouth daily. Take one Tuesday, Thursday, Saturday, Sunday   10/29/2018 at 0900  . mirtazapine (REMERON) 15 MG tablet Take 15 mg by mouth at bedtime.   10/29/2018 at 2100  . omeprazole (PRILOSEC) 20 MG capsule Take 20 mg by mouth 2 (two) times daily before a meal.   10/29/2018 at 1700  . Psyllium (KONSYL-D PO) Take 5 mLs by mouth 2 (two) times daily.   10/29/2018 at 1800  . senna-docusate (SENOKOT-S) 8.6-50 MG tablet Take 1 tablet by mouth 2 (two) times daily as needed (to prevent constipation; scheduled until bowel movement).    10/29/2018 at 2100  . sertraline (ZOLOFT) 50 MG tablet Take 150 mg by mouth at bedtime.    10/29/2018 at 2100  . sevelamer carbonate (RENVELA) 0.8 g PACK packet Take 0.8 g by mouth 3 (three) times daily with meals.   10/29/2018 at 1800  . acetaminophen (TYLENOL) 325 MG tablet Take 650 mg by mouth every 8 (eight) hours as needed (pain or fever).    prn at prn  . betamethasone dipropionate (DIPROLENE) 0.05 % ointment Apply topically 2 (two) times daily.   prn at prn  . bisacodyl (DULCOLAX) 10 MG suppository Place 10 mg rectally daily as needed for moderate constipation.   prn at prn  . Carboxymethylcellul-Glycerin (REFRESH OPTIVE SENSITIVE OP) Place 1 drop into both eyes every 4 (four) hours.   prn at prn  . clobetasol ointment (TEMOVATE) 7.42 % Apply 1 application topically 2 (two) times daily.   prn at prn  . Melatonin 5 MG CAPS Take by mouth.   prn at prn  . methocarbamol (ROBAXIN) 500 MG tablet Take 500 mg by mouth 2 (two) times daily.    Completed Course at Unknown time  . mupirocin cream (BACTROBAN) 2 % Apply 1 application topically 2 (two) times daily. (Patient not taking: Reported on 10/30/2018) 30 g 0 Completed Course at Unknown time  . naloxone (NARCAN) nasal spray 4 mg/0.1 mL Place 1 spray into the nose.   prn at prn  . oxycodone (OXY-IR) 5 MG capsule Take 5-10 mg by mouth every 4 (four) hours as needed for pain.    Completed Course at Unknown time  . polyvinyl alcohol (LIQUIFILM TEARS) 1.4 % ophthalmic solution Place 1 drop into both eyes 4 (four) times daily as needed for dry eyes.   prn at prn  . terbinafine (LAMISIL) 1 % cream Apply 1 application topically 2 (two) times daily.   prn at prn   Assessment: Per NP note - Pt's EKG with ST Elevation in Inferior leads (appears more pronounced than on previous EKG) along with A-fib w/ RVR (HR 110-130).  Pt's high sensitivity troponin 442 (previously 459). Pharmacy asked to initiate and monitor Heparin drip for ACS & A-fib.  Baseline aPTT 36s, INR 1.1  Heparin Course: 10/27 initiation: 4000 unit bolus then 1000 units/hr 10/28 0557  HL 0.37: no change  Goal of Therapy:  Heparin level 0.3-0.7 units/ml Monitor platelets by anticoagulation protocol: Yes   Plan:   Heparin level drawn approximately 7 hours after initiation and first level is therapeutic: continue Heparin infusion at 1000 units/hr  Recheck heparin level in 8 hours   H&H trending lower, PLT stable: f/u CBC in am  Dallie Piles, PharmD 11/01/2018,8:50 AM

## 2018-11-01 NOTE — Progress Notes (Signed)
[facility]  Date: 11/01/18 Time: 5:20 PM  S: I was alerted by patient's RN that Mr. Ashland was in VT.  He reports moderate chest pressure but otherwise feels okay.  Arrhythmia began about 20 min into HD session following L/RHC and PCI today.  O: BP: 105/90, HR 111 bpm Gen: NAD CV: IRRR, tachy. Lungs: clear anteriorly. Ext: Right groin cath site clean.  No hematoma.  EKG's: 5427: Wide complex tachycardia.  Favor a-flutter with 2:1 conduction and aberrancy but cannot exclude VT. 1707: Atrial fibrillation with nonspecific T wave abnormality.  Poor R wave progression.  A/P: 63 y/o man with ESRD admitted with anterior STEMI 2 days ago, complicated by continued chest pain requiring repeat cath and PCI to rPDA today and atrial fibrillation.  WCT could be a-flutter with aberrancy versus VT.  I am concerned that electrolyte abnormalities underlie this arrhythmia.  STAT BMP and Mg level have been ordered.  HD has been aborted by nephrology with plans to manage hyperkalemia medically tonight and reattempt HD tomorrow.  Repeat EKG does not show any acute ischemic changes.  If anything previously seen inferior ST elevation has resolved following today's intervention.  I do not believe this represents acute stent thrombosis.  Patient has significant small vessel disease that is not amenable to PCI.  I recommend continuation of amiodarone infusion and carvedilol.  Follow-up electrolytes and treat K/Mg levels as needed per nephrology and CCM.  We will have EP evaluate the patient tomorrow.  Restart heparin 8 hours after sheath removal today.  If patient has recurrent sustained wide complex tachycardia or becomes hemodynamically unstable, I recommend emergent cardioversion.  Nelva Bush, MD [facility] Pager: [phone]

## 2018-11-02 ENCOUNTER — Encounter: Payer: Self-pay | Admitting: Internal Medicine

## 2018-11-02 DIAGNOSIS — I2102 ST elevation (STEMI) myocardial infarction involving left anterior descending coronary artery: Secondary | ICD-10-CM | POA: Diagnosis not present

## 2018-11-02 DIAGNOSIS — I959 Hypotension, unspecified: Secondary | ICD-10-CM | POA: Diagnosis not present

## 2018-11-02 DIAGNOSIS — I255 Ischemic cardiomyopathy: Secondary | ICD-10-CM | POA: Diagnosis not present

## 2018-11-02 DIAGNOSIS — N186 End stage renal disease: Secondary | ICD-10-CM | POA: Diagnosis not present

## 2018-11-02 LAB — BASIC METABOLIC PANEL
Anion gap: 17 — ABNORMAL HIGH (ref 5–15)
BUN: 77 mg/dL — ABNORMAL HIGH (ref 8–23)
CO2: 24 mmol/L (ref 22–32)
Calcium: 7.8 mg/dL — ABNORMAL LOW (ref 8.9–10.3)
Chloride: 91 mmol/L — ABNORMAL LOW (ref 98–111)
Creatinine, Ser: 7.78 mg/dL — ABNORMAL HIGH (ref 0.61–1.24)
GFR calc Af Amer: 8 mL/min — ABNORMAL LOW (ref >60)
GFR calc non Af Amer: 7 mL/min — ABNORMAL LOW (ref >60)
Glucose, Bld: 107 mg/dL — ABNORMAL HIGH (ref 70–99)
Potassium: 5.4 mmol/L — ABNORMAL HIGH (ref 3.5–5.1)
Sodium: 132 mmol/L — ABNORMAL LOW (ref 135–145)

## 2018-11-02 LAB — CBC
HCT: 32.7 % — ABNORMAL LOW (ref 39.0–52.0)
Hemoglobin: 11.3 g/dL — ABNORMAL LOW (ref 13.0–17.0)
MCH: 34.8 pg — ABNORMAL HIGH (ref 26.0–34.0)
MCHC: 34.6 g/dL (ref 30.0–36.0)
MCV: 100.6 fL — ABNORMAL HIGH (ref 80.0–100.0)
Platelets: 242 10*3/uL (ref 150–400)
RBC: 3.25 MIL/uL — ABNORMAL LOW (ref 4.22–5.81)
RDW: 13.8 % (ref 11.5–15.5)
WBC: 6.9 10*3/uL (ref 4.0–10.5)
nRBC: 0.3 % — ABNORMAL HIGH (ref 0.0–0.2)

## 2018-11-02 LAB — HEPARIN LEVEL (UNFRACTIONATED)
Heparin Unfractionated: 0.22 IU/mL — ABNORMAL LOW (ref 0.30–0.70)
Heparin Unfractionated: <0.1 IU/mL — ABNORMAL LOW (ref 0.30–0.70)

## 2018-11-02 LAB — MAGNESIUM: Magnesium: 1.8 mg/dL (ref 1.7–2.4)

## 2018-11-02 MED ORDER — HEPARIN BOLUS VIA INFUSION
1250.0000 [IU] | Freq: Once | INTRAVENOUS | Status: AC
  Administered 2018-11-02: 1250 [IU] via INTRAVENOUS
  Filled 2018-11-02: qty 1250

## 2018-11-02 MED ORDER — CARVEDILOL 3.125 MG PO TABS: 3.1250 mg | ORAL_TABLET | Freq: Two times a day (BID) | ORAL | Status: DC

## 2018-11-02 MED ORDER — AMIODARONE HCL 200 MG PO TABS
400.0000 mg | ORAL_TABLET | Freq: Two times a day (BID) | ORAL | Status: DC
  Administered 2018-11-02 – 2018-11-05 (×6): 400 mg via ORAL
  Filled 2018-11-02 (×6): qty 2

## 2018-11-02 MED ORDER — HEPARIN BOLUS VIA INFUSION
2500.0000 [IU] | Freq: Once | INTRAVENOUS | Status: AC
  Administered 2018-11-02: 2500 [IU] via INTRAVENOUS
  Filled 2018-11-02: qty 2500

## 2018-11-02 MED ORDER — MIDODRINE HCL 5 MG PO TABS
10.0000 mg | ORAL_TABLET | Freq: Once | ORAL | Status: DC
  Filled 2018-11-02: qty 2

## 2018-11-02 NOTE — Care Management Important Message (Signed)
Important Message  Patient Details  Name: BLADEN UMAR MRN: [id number] Date of Birth: [birth year]   Medicare Important Message Given:  Yes  Initial Medicare IM given by Patient Access Associate on 10/30/2018 at 9:51am.   Dannette Barbara 11/02/2018, 4:16 PM

## 2018-11-02 NOTE — Progress Notes (Signed)
Pt lying in bed asleep and had a pause in heart rhythm and converted from Afib to sinus brady HR 53 pt amiodarone paused. Pt remains free of chest pain and discomfort will cont. to monitor.

## 2018-11-02 NOTE — Progress Notes (Signed)
[place] at [place] NAME: William Carpenter    MR#:  [id number]  DATE OF BIRTH:  02-12-1955  SUBJECTIVE:   Patient states he is feeling better today.  He denies any chest pain but does endorse occasional "chest pressure".  No shortness of breath or palpitations.  REVIEW OF SYSTEMS:  Review of Systems  Constitutional: Negative for chills and fever.  HENT: Negative for congestion and sore throat.   Eyes: Negative for blurred vision and double vision.  Respiratory: Negative for cough and shortness of breath.   Cardiovascular: Negative for chest pain, palpitations and leg swelling.  Gastrointestinal: Negative for nausea and vomiting.  Genitourinary: Negative for dysuria and urgency.  Musculoskeletal: Negative for back pain and neck pain.  Neurological: Negative for dizziness and headaches.  Psychiatric/Behavioral: Negative for depression. The patient is not nervous/anxious.     DRUG ALLERGIES:   Allergies  Allergen Reactions  . Albumin (Human) Hives  . Aloe   . Erythromycin Hives  . Iodinated Diagnostic Agents Hives  . Iron Dextran   . Metoprolol Tartrate   . Penicillins Swelling  . Quinine Sulfate [Quinine]   . Rabeprazole   . Sulfa Antibiotics Other (See Comments)  . Tape Other (See Comments)    Removes skin.  . Trimethoprim    VITALS:  Blood pressure 113/83, pulse (!) 58, temperature 97.6 F (36.4 C), temperature source Oral, resp. rate 18, height 5\' 9"  (1.753 m), weight 86.5 kg, SpO2 99 %. PHYSICAL EXAMINATION:  Physical Exam  GENERAL:   Sitting up in the chair with no acute distress.  HEENT: Head atraumatic, normocephalic. Pupils equal, round, reactive to light and accommodation. No scleral icterus. Extraocular muscles intact. Oropharynx and nasopharynx clear.  NECK:  Supple, no jugular venous distention. No thyroid enlargement. LUNGS: Lungs are clear to auscultation bilaterally. No wheezes, crackles, rhonchi. No use of  accessory muscles of respiration.  CARDIOVASCULAR: Bradycardic, regular rhythm, S1, S2 normal. No murmurs, rubs, or gallops.  ABDOMEN: Soft, nontender, nondistended. Bowel sounds present.  EXTREMITIES: No pedal edema, cyanosis, or clubbing.  NEUROLOGIC: CN 2-12 intact, no focal deficits. 5/5 muscle strength throughout all extremities. Sensation intact throughout. Gait not checked.  PSYCHIATRIC: The patient is alert and oriented x 3.  SKIN: No obvious rash, lesion, or ulcer.  LABORATORY PANEL:  Male CBC Recent Labs  Lab 11/02/18 0624  WBC 6.9  HGB 11.3*  HCT 32.7*  PLT 242   ------------------------------------------------------------------------------------------------------------------ Chemistries  Recent Labs  Lab 10/30/18 0216  11/02/18 0624  NA 135   < > 132*  K 4.9   < > 5.4*  CL 94*   < > 91*  CO2 25   < > 24  GLUCOSE 104*   < > 107*  BUN 60*   < > 77*  CREATININE 7.25*   < > 7.78*  CALCIUM 8.6*   < > 7.8*  MG  --    < > 1.8  AST 26  --   --   ALT 18  --   --   ALKPHOS 212*  --   --   BILITOT 1.7*  --   --    < > = values in this interval not displayed.   RADIOLOGY:  No results found. ASSESSMENT AND PLAN:   Acute STEMI- having persistent episodes of chest pain. -s/p cardiac catheterization on 10/26 with DES to the proximal and mid LAD -repeat cardiac cath 10/28 with stable disease and patent stents; rPDA  was stented -CTA chest 10/27 showed CAD, but no dissection or PE -Cardiology following -Continue dual antiplatelet therapy with aspirin and brilinta for at least 12 months -ECHO with EF 50-55% -Continue Coreg and Lipitor -No ACE/ARB due to hypotension  A-fib with RVR- resolved, converted back to NSR overnight -Transitioned from amiodarone drip to p.o. amiodarone 400 mg twice daily today -Continue low dose coreg -Continue heparin drip -Cardiac monitoring  Sepsis-initially meeting sepsis criteria on admission with fever and leukocytosis.  Sepsis has  been ruled out with negative cultures. -Off antibiotics  ESRD on HD MWF -Nephrology following -HD today and then return to usual schedule  Hyperlipidemia- LDL is 90 this admission -Continue home Lipitor  Hypertension- patient did have some low BPs earlier this admission, likely due to cardiogenic shock. -Continue midodrine -Continue coreg -Holding home hydralazine and losartan  All the records are reviewed and case discussed with Care Management/Social Worker. Management plans discussed with the patient, family and they are in agreement.  CODE STATUS: Full Code  TOTAL TIME TAKING CARE OF THIS PATIENT: 40 minutes.   More than 50% of the time was spent in counseling/coordination of care: YES  POSSIBLE D/C IN 1-2 DAYS, DEPENDING ON CLINICAL CONDITION.   Berna Spare Mayo M.D on 11/02/2018 at 4:08 PM  Between 7am to 6pm - Pager [phone]  After 6pm go to [email or web address] - Proofreader  [facility]  Office  [phone]  CC: Primary care physician; Raelyn Mora, MD  Note: This dictation was prepared with Dragon dictation along with smaller phrase technology. Any transcriptional errors that result from this process are unintentional.

## 2018-11-02 NOTE — Consult Note (Signed)
[place] for Electrolyte Monitoring and Replacement   Recent Labs: Potassium (mmol/L)  Date Value  11/02/2018 5.4 (H)  06/16/2013 4.4   Magnesium (mg/dL)  Date Value  11/02/2018 1.8   Calcium (mg/dL)  Date Value  11/02/2018 7.8 (L)   Calcium, Total (mg/dL)  Date Value  06/16/2013 8.9   Albumin (g/dL)  Date Value  11/01/2018 3.3 (L)  06/16/2013 3.3 (L)   Phosphorus (mg/dL)  Date Value  11/01/2018 3.6   Sodium (mmol/L)  Date Value  11/02/2018 132 (L)  06/16/2013 141   Corrected Ca: 8.36 mg/dL  Assessment: 63 y.o. male with a history of ESRD secondary to FSGS, HTN, HLD, and chronic pain, admitted with anterior STEMI  Goals of Therapy:  Potassium 4.0 - 5.1 mmol/L Magnesium 2.0 - 2.4 mg/dL All other electrolytes WNL  Plan:   Deferred magnesium replacement  Re-check electrolytes in am  Sallye Lat ,PharmD Candidate 11/02/2018 12:27 PM

## 2018-11-02 NOTE — Progress Notes (Signed)
OT Cancellation Note  Patient Details Name: William Carpenter MRN: [id number] DOB: [birth year]   Cancelled Treatment:    Reason Eval/Treat Not Completed: Medical issues which prohibited therapy. Consult received, chart reviewed. Pt noted with K+ 5.4. Inappropriate for OT this date. Will re-attempt at later date/time as medically appropriate.   Jeni Salles, MPH, MS, OTR/L ascom [phone] 11/02/18, 4:00 PM

## 2018-11-02 NOTE — Progress Notes (Signed)
ANTICOAGULATION CONSULT NOTE -   Pharmacy Consult for Heparin Indication: chest pain/ACS  Patient Measurements: Height: 5\' 9"  (175.3 cm) Weight: 190 lb 11.2 oz (86.5 kg) IBW/kg (Calculated) : 70.7 HEPARIN DW (KG): 88  Vital Signs: Temp: 98.1 F (36.7 C) (10/29 2000) Temp Source: Oral (10/29 2000) BP: 136/95 (10/29 1800) Pulse Rate: 72 (10/29 1800)  Labs: Recent Labs    10/31/18 0440 10/31/18 2133 10/31/18 2240 10/31/18 2331 11/01/18 0557 11/01/18 1724 11/02/18 0624 11/02/18 2020  HGB 12.1*  --   --   --  11.1*  --  11.3*  --   HCT 36.8*  --   --   --  32.8*  --  32.7*  --   PLT 245  --   --   --  241  --  242  --   APTT  --   --  36  --   --   --   --   --   LABPROT 15.1  --  14.2  --   --   --   --   --   INR 1.2  --  1.1  --   --   --   --   --   HEPARINUNFRC  --   --   --   --  0.37  --  0.22* <0.10*  CREATININE 5.72*  --   --   --  7.10* 6.36* 7.78*  --   TROPONINIHS 459* 442*  --  423*  --   --   --   --     Estimated Creatinine Clearance: 10.6 mL/min (A) (by C-G formula based on SCr of 7.78 mg/dL (H)).   Medical History: Past Medical History:  Diagnosis Date  . Bullous dermatosis   . Dialysis patient ([place])   . Dysphagia   . FSGS (focal segmental glomerulosclerosis)   . Hyperlipidemia   . Renal insufficiency   . Spinal stenosis, cervical region     Medications:  Medications Prior to Admission  Medication Sig Dispense Refill Last Dose  . amLODipine (NORVASC) 5 MG tablet Take 5 mg by mouth daily.    10/29/2018 at 0900  . atorvastatin (LIPITOR) 40 MG tablet Take 20 mg by mouth daily.    10/29/2018 at 2100  . b complex-vitamin c-folic acid (NEPHRO-VITE) 0.8 MG TABS tablet Take 1 tablet by mouth at bedtime.   10/29/2018 at 2100  . calcium acetate (PHOSLO) 667 MG capsule Take 1,334 mg by mouth 3 (three) times daily with meals.    10/29/2018 at 1800  . carboxymethylcellulose (REFRESH PLUS) 0.5 % SOLN 1 drop every 2 (two) hours while awake.   prn at prn  .  carvedilol (COREG) 25 MG tablet Take 25 mg by mouth 2 (two) times daily with a meal.    10/29/2018 at 1800  . cetirizine (ZYRTEC) 10 MG tablet Take 10 mg by mouth daily.   10/29/2018 at 2100  . cinacalcet (SENSIPAR) 90 MG tablet Take 90 mg by mouth daily.   10/29/2018 at 0900  . cycloSPORINE (RESTASIS) 0.05 % ophthalmic emulsion Place 1 drop into both eyes 2 (two) times daily.   10/29/2018 at 2000  . docusate sodium (COLACE) 100 MG capsule Take 100 mg by mouth daily.   10/29/2018 at 0900  . dorzolamidel-timolol (COSOPT) 22.3-6.8 MG/ML SOLN ophthalmic solution 1 drop 2 (two) times daily.   10/29/2018 at 2100  . hydrALAZINE (APRESOLINE) 10 MG tablet Take 10 mg by mouth every 4 (four) hours as  needed (systolic BP >[id number]).    10/29/2018 at 1500  . hydrOXYzine (VISTARIL) 25 MG capsule Take 25 mg by mouth 3 (three) times daily as needed.   10/29/2018 at 2100  . ketoconazole (NIZORAL) 2 % shampoo Apply 1 application topically 2 (two) times a week.   Past Week at Unknown time  . lactulose (CHRONULAC) 10 GM/15ML solution Take by mouth 3 (three) times daily.   10/29/2018 at 2100  . latanoprost (XALATAN) 0.005 % ophthalmic solution Place 1 drop into both eyes at bedtime.   10/29/2018 at 2100  . losartan (COZAAR) 50 MG tablet Take 50 mg by mouth daily. Take one Tuesday, Thursday, Saturday, Sunday   10/29/2018 at 0900  . mirtazapine (REMERON) 15 MG tablet Take 15 mg by mouth at bedtime.   10/29/2018 at 2100  . omeprazole (PRILOSEC) 20 MG capsule Take 20 mg by mouth 2 (two) times daily before a meal.   10/29/2018 at 1700  . Psyllium (KONSYL-D PO) Take 5 mLs by mouth 2 (two) times daily.   10/29/2018 at 1800  . senna-docusate (SENOKOT-S) 8.6-50 MG tablet Take 1 tablet by mouth 2 (two) times daily as needed (to prevent constipation; scheduled until bowel movement).    10/29/2018 at 2100  . sertraline (ZOLOFT) 50 MG tablet Take 150 mg by mouth at bedtime.   10/29/2018 at 2100  . sevelamer carbonate (RENVELA) 0.8 g  PACK packet Take 0.8 g by mouth 3 (three) times daily with meals.   10/29/2018 at 1800  . acetaminophen (TYLENOL) 325 MG tablet Take 650 mg by mouth every 8 (eight) hours as needed (pain or fever).    prn at prn  . betamethasone dipropionate (DIPROLENE) 0.05 % ointment Apply topically 2 (two) times daily.   prn at prn  . bisacodyl (DULCOLAX) 10 MG suppository Place 10 mg rectally daily as needed for moderate constipation.   prn at prn  . Carboxymethylcellul-Glycerin (REFRESH OPTIVE SENSITIVE OP) Place 1 drop into both eyes every 4 (four) hours.   prn at prn  . clobetasol ointment (TEMOVATE) 6.30 % Apply 1 application topically 2 (two) times daily.   prn at prn  . Melatonin 5 MG CAPS Take by mouth.   prn at prn  . methocarbamol (ROBAXIN) 500 MG tablet Take 500 mg by mouth 2 (two) times daily.    Completed Course at Unknown time  . mupirocin cream (BACTROBAN) 2 % Apply 1 application topically 2 (two) times daily. (Patient not taking: Reported on 10/30/2018) 30 g 0 Completed Course at Unknown time  . naloxone (NARCAN) nasal spray 4 mg/0.1 mL Place 1 spray into the nose.   prn at prn  . oxycodone (OXY-IR) 5 MG capsule Take 5-10 mg by mouth every 4 (four) hours as needed for pain.    Completed Course at Unknown time  . polyvinyl alcohol (LIQUIFILM TEARS) 1.4 % ophthalmic solution Place 1 drop into both eyes 4 (four) times daily as needed for dry eyes.   prn at prn  . terbinafine (LAMISIL) 1 % cream Apply 1 application topically 2 (two) times daily.   prn at prn   Assessment: Per NP note - Pt's EKG with ST Elevation in Inferior leads (appears more pronounced than on previous EKG) along with A-fib w/ RVR (HR 110-130).  Pt's high sensitivity troponin 442 (previously 459). Pharmacy asked to initiate and monitor Heparin drip for ACS & A-fib. Baseline aPTT 36s, INR 1.1  Heparin Course: 10/29 0624 HL 0.22 10/29 2020 HL <0.10(spoke to nurse,  stated line was never stopped and no infusion site  complications)  Goal of Therapy:  Heparin level 0.3-0.7 units/ml Monitor platelets by anticoagulation protocol: Yes   Plan:   Will rebolus with 2500 units x1 then change rate to 1400 units/hr  Recheck heparin level with AM labs  H&H trending lower, PLT stable: f/u CBC in am  Pearla Dubonnet, PharmD Candidate 11/02/2018,10:23 PM

## 2018-11-02 NOTE — Progress Notes (Signed)
ANTICOAGULATION CONSULT NOTE -   Pharmacy Consult for Heparin Indication: chest pain/ACS  Patient Measurements: Height: 5\' 9"  (175.3 cm) Weight: 190 lb 11.2 oz (86.5 kg) IBW/kg (Calculated) : 70.7 HEPARIN DW (KG): 88  Vital Signs:    Labs: Recent Labs    10/31/18 0440 10/31/18 2133 10/31/18 2240 10/31/18 2331 11/01/18 0557 11/01/18 1724 11/02/18 0624  HGB 12.1*  --   --   --  11.1*  --  11.3*  HCT 36.8*  --   --   --  32.8*  --  32.7*  PLT 245  --   --   --  241  --  242  APTT  --   --  36  --   --   --   --   LABPROT 15.1  --  14.2  --   --   --   --   INR 1.2  --  1.1  --   --   --   --   HEPARINUNFRC  --   --   --   --  0.37  --  0.22*  CREATININE 5.72*  --   --   --  7.10* 6.36* 7.78*  TROPONINIHS 459* 442*  --  423*  --   --   --     Estimated Creatinine Clearance: 10.6 mL/min (A) (by C-G formula based on SCr of 7.78 mg/dL (H)).   Medical History: Past Medical History:  Diagnosis Date  . Bullous dermatosis   . Dialysis patient ([place])   . Dysphagia   . FSGS (focal segmental glomerulosclerosis)   . Hyperlipidemia   . Renal insufficiency   . Spinal stenosis, cervical region     Medications:  Medications Prior to Admission  Medication Sig Dispense Refill Last Dose  . amLODipine (NORVASC) 5 MG tablet Take 5 mg by mouth daily.    10/29/2018 at 0900  . atorvastatin (LIPITOR) 40 MG tablet Take 20 mg by mouth daily.    10/29/2018 at 2100  . b complex-vitamin c-folic acid (NEPHRO-VITE) 0.8 MG TABS tablet Take 1 tablet by mouth at bedtime.   10/29/2018 at 2100  . calcium acetate (PHOSLO) 667 MG capsule Take 1,334 mg by mouth 3 (three) times daily with meals.    10/29/2018 at 1800  . carboxymethylcellulose (REFRESH PLUS) 0.5 % SOLN 1 drop every 2 (two) hours while awake.   prn at prn  . carvedilol (COREG) 25 MG tablet Take 25 mg by mouth 2 (two) times daily with a meal.    10/29/2018 at 1800  . cetirizine (ZYRTEC) 10 MG tablet Take 10 mg by mouth daily.   10/29/2018  at 2100  . cinacalcet (SENSIPAR) 90 MG tablet Take 90 mg by mouth daily.   10/29/2018 at 0900  . cycloSPORINE (RESTASIS) 0.05 % ophthalmic emulsion Place 1 drop into both eyes 2 (two) times daily.   10/29/2018 at 2000  . docusate sodium (COLACE) 100 MG capsule Take 100 mg by mouth daily.   10/29/2018 at 0900  . dorzolamidel-timolol (COSOPT) 22.3-6.8 MG/ML SOLN ophthalmic solution 1 drop 2 (two) times daily.   10/29/2018 at 2100  . hydrALAZINE (APRESOLINE) 10 MG tablet Take 10 mg by mouth every 4 (four) hours as needed (systolic BP >[id number]).    10/29/2018 at 1500  . hydrOXYzine (VISTARIL) 25 MG capsule Take 25 mg by mouth 3 (three) times daily as needed.   10/29/2018 at 2100  . ketoconazole (NIZORAL) 2 % shampoo Apply 1 application topically 2 (two)  times a week.   Past Week at Unknown time  . lactulose (CHRONULAC) 10 GM/15ML solution Take by mouth 3 (three) times daily.   10/29/2018 at 2100  . latanoprost (XALATAN) 0.005 % ophthalmic solution Place 1 drop into both eyes at bedtime.   10/29/2018 at 2100  . losartan (COZAAR) 50 MG tablet Take 50 mg by mouth daily. Take one Tuesday, Thursday, Saturday, Sunday   10/29/2018 at 0900  . mirtazapine (REMERON) 15 MG tablet Take 15 mg by mouth at bedtime.   10/29/2018 at 2100  . omeprazole (PRILOSEC) 20 MG capsule Take 20 mg by mouth 2 (two) times daily before a meal.   10/29/2018 at 1700  . Psyllium (KONSYL-D PO) Take 5 mLs by mouth 2 (two) times daily.   10/29/2018 at 1800  . senna-docusate (SENOKOT-S) 8.6-50 MG tablet Take 1 tablet by mouth 2 (two) times daily as needed (to prevent constipation; scheduled until bowel movement).    10/29/2018 at 2100  . sertraline (ZOLOFT) 50 MG tablet Take 150 mg by mouth at bedtime.   10/29/2018 at 2100  . sevelamer carbonate (RENVELA) 0.8 g PACK packet Take 0.8 g by mouth 3 (three) times daily with meals.   10/29/2018 at 1800  . acetaminophen (TYLENOL) 325 MG tablet Take 650 mg by mouth every 8 (eight) hours as needed  (pain or fever).    prn at prn  . betamethasone dipropionate (DIPROLENE) 0.05 % ointment Apply topically 2 (two) times daily.   prn at prn  . bisacodyl (DULCOLAX) 10 MG suppository Place 10 mg rectally daily as needed for moderate constipation.   prn at prn  . Carboxymethylcellul-Glycerin (REFRESH OPTIVE SENSITIVE OP) Place 1 drop into both eyes every 4 (four) hours.   prn at prn  . clobetasol ointment (TEMOVATE) 4.26 % Apply 1 application topically 2 (two) times daily.   prn at prn  . Melatonin 5 MG CAPS Take by mouth.   prn at prn  . methocarbamol (ROBAXIN) 500 MG tablet Take 500 mg by mouth 2 (two) times daily.    Completed Course at Unknown time  . mupirocin cream (BACTROBAN) 2 % Apply 1 application topically 2 (two) times daily. (Patient not taking: Reported on 10/30/2018) 30 g 0 Completed Course at Unknown time  . naloxone (NARCAN) nasal spray 4 mg/0.1 mL Place 1 spray into the nose.   prn at prn  . oxycodone (OXY-IR) 5 MG capsule Take 5-10 mg by mouth every 4 (four) hours as needed for pain.    Completed Course at Unknown time  . polyvinyl alcohol (LIQUIFILM TEARS) 1.4 % ophthalmic solution Place 1 drop into both eyes 4 (four) times daily as needed for dry eyes.   prn at prn  . terbinafine (LAMISIL) 1 % cream Apply 1 application topically 2 (two) times daily.   prn at prn   Assessment: Per NP note - Pt's EKG with ST Elevation in Inferior leads (appears more pronounced than on previous EKG) along with A-fib w/ RVR (HR 110-130).  Pt's high sensitivity troponin 442 (previously 459). Pharmacy asked to initiate and monitor Heparin drip for ACS & A-fib. Baseline aPTT 36s, INR 1.1  Heparin Course: 10/29 0624 HL 0.22  Goal of Therapy:  Heparin level 0.3-0.7 units/ml Monitor platelets by anticoagulation protocol: Yes   Plan:   1250 unit bolus then rate changed to 1150 units/hr; given at 1203  Recheck heparin level 8 hours; at 2000  H&H trending lower, PLT stable: f/u CBC in am  [place]  Bettylee Feig, PharmD Candidate 11/02/2018,12:21 PM

## 2018-11-02 NOTE — Progress Notes (Signed)
[facility], [place] 11/02/18  Subjective:   LOS: 3   Intake/output 10/28 0701 - 10/29 0700 In: 1219.4 [P.O.:360; I.V.:809.4; IV Piggyback:50] Out: -260   Patient known to our practice from previous admissions He presented to the emergency room for complaints of chest pain and fever. Diagnosed with STEMI. Underwent emergent left heart catheterization and Found to have severe multivessel coronary disease with culprit lesion involving proximal and mid LAD which was successfully treated with 2 overlapping drug-eluting stents.  Chest pain significantly improved after the procedure.  Underwent successful HD on Monday. 1500 cc removed  Patient did not tolerate hemodialysis on Wednesday.  Within 30 minutes, he became tachycardic with a heart rate of 150s.  He had arrhythmias.  Hemodialysis was stopped.  Today, he sitting up in his chair.  He is currently on room air. No shortness of breath.  Felt some heaviness in his chest last night.     Objective:  Vital signs in last 24 hours:  Temp:  [77 F (25 C)-98 F (36.7 C)] 98 F (36.7 C) (10/28 1946) Pulse Rate:  [39-155] 125 (10/28 1800) Resp:  [13-24] 24 (10/28 1800) BP: (105-125)/(90-101) 120/94 (10/28 1800) SpO2:  [97 %-100 %] 97 % (10/28 1800)  Weight change:  Filed Weights   10/30/18 0210 10/30/18 1400 10/30/18 1750  Weight: 88 kg 88 kg 86.5 kg    Intake/Output:    Intake/Output Summary (Last 24 hours) at 11/02/2018 1001 Last data filed at 11/02/2018 0600 Gross per 24 hour  Intake 1219.4 ml  Output -260 ml  Net 1479.4 ml     Physical Exam: General:  No acute distress, laying in bed  HEENT  anicteric, moist oral mucous membranes  Pulm/lungs  normal breathing effort on room air  CVS/Heart  irregular  Abdomen:   Soft, nontender  Extremities:  Trace to 1+ edema  Neurologic:  Alert, oriented  Skin:  No acute rashes  Access:  Left forearm AV fistula       Basic Metabolic  Panel:  Recent Labs  Lab 10/30/18 0510 10/30/18 1516 10/31/18 0440 11/01/18 0557 11/01/18 1724 11/02/18 0624  NA 136  --  136 132* 130* 132*  K 4.8  --  4.4 5.6* 5.2* 5.4*  CL 97*  --  96* 93* 91* 91*  CO2 24  --  26 24 22 24   GLUCOSE 88  --  89 135* 137* 107*  BUN 63*  --  42* 63* 61* 77*  CREATININE 7.35*  --  5.72* 7.10* 6.36* 7.78*  CALCIUM 8.1*  --  8.3* 8.1* 7.8* 7.8*  MG  --   --   --  1.8 1.8 1.8  PHOS  --  2.5 2.8 3.6  --   --      CBC: Recent Labs  Lab 10/30/18 0216 10/30/18 0510 10/31/18 0440 11/01/18 0557 11/02/18 0624  WBC 12.7* 14.2* 11.1* 7.1 6.9  NEUTROABS 7.8*  --   --   --   --   HGB 13.5 12.8* 12.1* 11.1* 11.3*  HCT 42.3 40.1 36.8* 32.8* 32.7*  MCV 107.4* 106.4* 104.0* 101.5* 100.6*  PLT 269 259 245 241 242      Lab Results  Component Value Date   HEPBSAG NON REACTIVE 10/30/2018      Microbiology:  Recent Results (from the past 240 hour(s))  Blood Culture (routine x 2)     Status: None (Preliminary result)   Collection Time: 10/30/18  2:16 AM   Specimen: BLOOD  Result Value Ref Range Status   Specimen Description BLOOD RIGHT FOREARM  Final   Special Requests   Final    BOTTLES DRAWN AEROBIC AND ANAEROBIC Blood Culture adequate volume   Culture   Final    NO GROWTH 3 DAYS Performed at [facility], [address] [place], [place] [address]    Report Status PENDING  Incomplete  Blood Culture (routine x 2)     Status: None (Preliminary result)   Collection Time: 10/30/18  2:16 AM   Specimen: BLOOD  Result Value Ref Range Status   Specimen Description BLOOD RIGHT ANTECUBITAL  Final   Special Requests   Final    BOTTLES DRAWN AEROBIC AND ANAEROBIC Blood Culture adequate volume   Culture   Final    NO GROWTH 3 DAYS Performed at [facility], [address] [place], [place] [address]    Report Status PENDING  Incomplete  SARS Coronavirus 2 by RT PCR (hospital order, performed in [place] hospital lab)  Nasopharyngeal Nasopharyngeal Swab     Status: None   Collection Time: 10/30/18  2:16 AM   Specimen: Nasopharyngeal Swab  Result Value Ref Range Status   SARS Coronavirus 2 NEGATIVE NEGATIVE Final    Comment: (NOTE) If result is NEGATIVE SARS-CoV-2 target nucleic acids are NOT DETECTED. The SARS-CoV-2 RNA is generally detectable in upper and lower  respiratory specimens during the acute phase of infection. The lowest  concentration of SARS-CoV-2 viral copies this assay can detect is 250  copies / mL. A negative result does not preclude SARS-CoV-2 infection  and should not be used as the sole basis for treatment or other  patient management decisions.  A negative result may occur with  improper specimen collection / handling, submission of specimen other  than nasopharyngeal swab, presence of viral mutation(s) within the  areas targeted by this assay, and inadequate number of viral copies  (<250 copies / mL). A negative result must be combined with clinical  observations, patient history, and epidemiological information. If result is POSITIVE SARS-CoV-2 target nucleic acids are DETECTED. The SARS-CoV-2 RNA is generally detectable in upper and lower  respiratory specimens dur ing the acute phase of infection.  Positive  results are indicative of active infection with SARS-CoV-2.  Clinical  correlation with patient history and other diagnostic information is  necessary to determine patient infection status.  Positive results do  not rule out bacterial infection or co-infection with other viruses. If result is PRESUMPTIVE POSTIVE SARS-CoV-2 nucleic acids MAY BE PRESENT.   A presumptive positive result was obtained on the submitted specimen  and confirmed on repeat testing.  While 2019 novel coronavirus  (SARS-CoV-2) nucleic acids may be present in the submitted sample  additional confirmatory testing may be necessary for epidemiological  and / or clinical management purposes  to  differentiate between  SARS-CoV-2 and other Sarbecovirus currently known to infect humans.  If clinically indicated additional testing with an alternate test  methodology [phone]) is advised. The SARS-CoV-2 RNA is generally  detectable in upper and lower respiratory sp ecimens during the acute  phase of infection. The expected result is Negative. Fact Sheet for Patients:  StrictlyIdeas.no Fact Sheet for Healthcare Providers: BankingDealers.co.za This test is not yet approved or cleared by the [place] FDA and has been authorized for detection and/or diagnosis of SARS-CoV-2 by FDA under an Emergency Use Authorization (EUA).  This EUA will remain in effect (meaning this test can be used) for the duration  of the COVID-19 declaration under Section 564(b)(1) of the Act, 21 U.S.C. section 360bbb-3(b)(1), unless the authorization is terminated or revoked sooner. Performed at [facility], [address] [place], [place] [address]   MRSA PCR Screening     Status: None   Collection Time: 10/30/18  5:45 AM   Specimen: Nasopharyngeal  Result Value Ref Range Status   MRSA by PCR NEGATIVE NEGATIVE Final    Comment:        The GeneXpert MRSA Assay (FDA approved for NASAL specimens only), is one component of a comprehensive MRSA colonization surveillance program. It is not intended to diagnose MRSA infection nor to guide or monitor treatment for MRSA infections. Performed at [facility], [address] [place], [place] [address]     Coagulation Studies: Recent Labs    10/31/18 0440 10/31/18 2240  LABPROT 15.1 14.2  INR 1.2 1.1    Urinalysis: No results for input(s): COLORURINE, LABSPEC, PHURINE, GLUCOSEU, HGBUR, BILIRUBINUR, KETONESUR, PROTEINUR, UROBILINOGEN, NITRITE, LEUKOCYTESUR in the last 72 hours.  Invalid input(s): APPERANCEUR    Imaging: Ct Angio Chest/abd/pel For Dissection W And/or  W/wo  Result Date: 10/31/2018 CLINICAL DATA:  63 year old male with history of acute onset of chest and back pain. Suspected aortic dissection. EXAM: CT ANGIOGRAPHY CHEST, ABDOMEN AND PELVIS TECHNIQUE: Multidetector CT imaging through the chest, abdomen and pelvis was performed using the standard protocol during bolus administration of intravenous contrast. Multiplanar reconstructed images and MIPs were obtained and reviewed to evaluate the vascular anatomy. CONTRAST:  166mL OMNIPAQUE IOHEXOL 350 MG/ML SOLN COMPARISON:  CT the chest, abdomen and pelvis 06/26/2016. FINDINGS: CTA CHEST FINDINGS Cardiovascular: There is no crescentic high attenuation associated with the wall of the thoracic aorta on precontrast images to suggest acute intramural hematoma. No evidence of thoracic aortic aneurysm or dissection. Ascending thoracic aorta aortic arch and descending thoracic aorta measure 3.6 cm, 3.0 cm and 2.5 cm in diameter respectively. 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 There is no significant pericardial fluid, thickening or pericardial calcification. There is aortic atherosclerosis, as well as atherosclerosis of the great vessels of the mediastinum and the coronary arteries, including calcified atherosclerotic plaque in the left main, left anterior descending, left circumflex and right coronary arteries. Calcifications of the aortic valve. Dilatation of the pulmonic trunk (3.8 cm in diameter). Mediastinum/Nodes: No pathologically enlarged mediastinal or hilar lymph nodes. Esophagus is unremarkable in appearance. No axillary lymphadenopathy. Lungs/Pleura: Small calcified granuloma in the apex of the left upper lobe. 5 mm right upper lobe pulmonary nodule (axial image 78 of series 6), stable compared to the prior study, considered  definitively benign. No other suspicious appearing pulmonary nodules or masses are noted. Linear scarring in the left lower lobe, similar to the prior study. Trace left pleural effusion, new compared to the prior study. No acute consolidative airspace disease. Musculoskeletal: Diffuse sclerosis throughout the visualized axial and appendicular skeleton, potentially related to hyperparathyroidism in this patient with history of end stage renal disease. Review of the MIP images confirms the above findings. CTA ABDOMEN AND PELVIS FINDINGS VASCULAR Aorta: Normal caliber aorta without aneurysm, dissection, vasculitis or significant stenosis. Celiac: Patent without evidence of aneurysm, dissection, vasculitis or significant stenosis. SMA: Patent without evidence of aneurysm, dissection, vasculitis or significant stenosis. Renals: Right renal artery is patent without evidence of aneurysm, dissection, vasculitis, fibromuscular dysplasia or  significant stenosis. IMA: Patent without evidence of aneurysm, dissection, vasculitis or significant stenosis. Inflow: Patent without evidence of aneurysm, dissection, vasculitis or significant stenosis. Veins: No obvious venous abnormality within the limitations of this arterial phase study. Review of the MIP images confirms the above findings. NON-VASCULAR Hepatobiliary: No suspicious cystic or solid hepatic lesions are confidently identified on today's arterial phase examination. High attenuation lying dependently in the gallbladder, which presumably represents biliary sludge lying dependently. No surrounding inflammatory changes to suggest an acute cholecystitis at this time. Pancreas: No pancreatic mass. No pancreatic ductal dilatation. No pancreatic or peripancreatic fluid collections or inflammatory changes. Spleen: Status post splenectomy. Adrenals/Urinary Tract: Status post left nephrectomy. Right native kidney is severely atrophic with innumerable small low-attenuation lesions  throughout the kidney, presumably cysts related to cystic disease of dialysis. 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 No hydroureteronephrosis. Urinary bladder is completely decompressed. Right adrenal gland is normal in appearance. Left adrenal gland is not confidently identified may be surgically absent. Stomach/Bowel: Normal appearance of the stomach. No pathologic dilatation of small bowel or colon. Numerous colonic diverticulae are noted, with some subtle surrounding inflammatory changes, which could be indicative of an acute diverticulitis, but are more likely related to mild edema in this patient with history of renal failure. The appendix is not confidently identified and may be surgically absent. Regardless, there are no inflammatory changes noted adjacent to the cecum to suggest the presence of an acute appendicitis at this time. Lymphatic: Multiple prominent borderline enlarged lymph nodes are noted throughout the pelvis measuring up to 9 mm in short axis, nonspecific. No pathologically enlarged lymph nodes identified in the abdomen or pelvis. Reproductive: Prostate gland and seminal vesicles are unremarkable in appearance. Other: Trace volume of ascites.  No pneumoperitoneum. Musculoskeletal: Diffuse sclerosis throughout the visualized axial and appendicular skeleton, favored to be related to hyperparathyroidism in this patient with history of end-stage renal disease. Status post laminectomy from L3-L5. Review of the MIP images confirms the above findings. IMPRESSION: 1. No evidence of aortic dissection or acute aortic syndrome. 2. Trace left pleural effusion. 3. Aortic atherosclerosis, in addition to left main and 3 vessel coronary artery disease. Please note that although the presence of coronary artery calcium documents the presence of coronary artery disease, the severity of this disease and any potential stenosis cannot be assessed on this  non-gated CT examination. Assessment for potential risk factor modification, dietary therapy or pharmacologic therapy may be warranted, if clinically indicated. 4. Dilatation of the pulmonic trunk (3.8 cm in diameter), concerning for pulmonary arterial hypertension. 5. There are calcifications of the aortic valve. Echocardiographic correlation for evaluation of potential valvular dysfunction may be warranted if clinically indicated. 6. Colonic diverticulosis without definitive findings to suggest an acute diverticulitis at this time. There are subtle areas of stranding adjacent to the sigmoid colon which are favored to be related to a background state of mild soft tissue edema in this patient with history of end stage renal disease. 7. Additional incidental findings, as above. Electronically Signed   By: Vinnie Langton M.D.   On: 10/31/2018 14:34     Medications:   . sodium chloride 10 mL/hr at 10/30/18 0253  . sodium chloride    . amiodarone Stopped (11/02/18 0325)  . heparin 1,000 Units/hr (11/01/18 2232)  . magnesium sulfate bolus IVPB     . artificial tears   Both Eyes BID  . aspirin  81 mg Oral Daily  .  atorvastatin  80 mg Oral Daily  . calcium acetate  1,334 mg Oral TID WC  . carvedilol  3.125 mg Oral BID WC  . Chlorhexidine Gluconate Cloth  6 each Topical Q0600  . cinacalcet  90 mg Oral Q breakfast  . cycloSPORINE  1 drop Both Eyes BID  . docusate sodium  100 mg Oral Daily  . dorzolamide-timolol  1 drop Both Eyes BID  . feeding supplement (NEPRO CARB STEADY)  237 mL Oral BID BM  . hydroxypropyl methylcellulose / hypromellose  1 drop Both Eyes Q4H while awake  . latanoprost  1 drop Both Eyes QHS  . loratadine  10 mg Oral Daily  . mirtazapine  15 mg Oral QHS  . multivitamin  1 tablet Oral QHS  . pantoprazole  40 mg Oral Daily  . sertraline  150 mg Oral QHS  . sevelamer carbonate  0.8 g Oral TID WC  . sodium chloride flush  3 mL Intravenous Q12H  . sodium chloride flush  3 mL  Intravenous Q12H  . ticagrelor  90 mg Oral BID   sodium chloride, acetaminophen, acetaminophen, Melatonin, methocarbamol, midodrine, morphine injection, nitroGLYCERIN, ondansetron (ZOFRAN) IV, oxyCODONE, senna-docusate, sodium chloride flush  Assessment/ Plan:  63 y.o. male with ESRD, dialysis for almost 30 years, during the patient, recent cervical decompression, autoimmune skin disease, STEMI s/p PCI 2 DES to LAD on 10/30/2018 is admitted for:  Principal Problem:   STEMI (ST elevation myocardial infarction) ([place]) Active Problems:   STEMI involving left anterior descending coronary artery (H[facility])   Chest pain of uncertain etiology   Ischemic cardiomyopathy  [facility]/88 kg/ MWF/ 3.45 min. Left arm AVF  #. ESRD with mild hyperkalemia Patient is critically ill with recent STEMI s/p PCI 10/26 Iv contrast exposure for CT angio on 10/27 and for repeat heart cath Plan for HD later today Will use low blood flow for dialysis today. Discussed patient to look into peritoneal dialysis in near future.  Patient will think about it.  #. Anemia of CKD  Lab Results  Component Value Date   HGB 11.3 (L) 11/02/2018  Hold Epogen because of ongoing acute coronary syndrome  #. SHPTH  No results found for: PTH Lab Results  Component Value Date   PHOS 3.6 11/01/2018  Monitor phosphorus during hospitalization  # STEMI Underwent emergent PCI 10/26.  Severe multivessel coronary disease with culprit lesion involving proximal and mid LAD treated with 2 drug-eluting stents.   Repeat gheart cath on 10/28 showed right heart pressures were severely elevated.another stent was placed    LOS: [place] 10/29/202010:01 AM  [place], [place]

## 2018-11-02 NOTE — Progress Notes (Signed)
Progress Note  Patient Name: William Carpenter Date of Encounter: 11/02/2018  Primary Cardiologist: [facility]-Dr. END  Subjective   Reports feeling well, no chest pain, Receiving dialysis today Normally does Monday Wednesday Friday Unable to complete dialysis yesterday secondary to sustained VT Converted from atrial fibrillation to normal sinus rhythm at 3:23 AM, maintaining normal sinus rhythm   Inpatient Medications    Scheduled Meds: . artificial tears   Both Eyes BID  . aspirin  81 mg Oral Daily  . atorvastatin  80 mg Oral Daily  . calcium acetate  1,334 mg Oral TID WC  . carvedilol  3.125 mg Oral BID WC  . Chlorhexidine Gluconate Cloth  6 each Topical Q0600  . cinacalcet  90 mg Oral Q breakfast  . cycloSPORINE  1 drop Both Eyes BID  . docusate sodium  100 mg Oral Daily  . dorzolamide-timolol  1 drop Both Eyes BID  . feeding supplement (NEPRO CARB STEADY)  237 mL Oral BID BM  . hydroxypropyl methylcellulose / hypromellose  1 drop Both Eyes Q4H while awake  . latanoprost  1 drop Both Eyes QHS  . loratadine  10 mg Oral Daily  . mirtazapine  15 mg Oral QHS  . multivitamin  1 tablet Oral QHS  . pantoprazole  40 mg Oral Daily  . sertraline  150 mg Oral QHS  . sevelamer carbonate  0.8 g Oral TID WC  . sodium chloride flush  3 mL Intravenous Q12H  . sodium chloride flush  3 mL Intravenous Q12H  . ticagrelor  90 mg Oral BID   Continuous Infusions: . sodium chloride 10 mL/hr at 10/30/18 0253  . sodium chloride    . amiodarone Stopped (11/02/18 0325)  . heparin 1,150 Units/hr (11/02/18 1203)   PRN Meds: sodium chloride, acetaminophen, acetaminophen, Melatonin, methocarbamol, midodrine, morphine injection, nitroGLYCERIN, ondansetron (ZOFRAN) IV, oxyCODONE, senna-docusate, sodium chloride flush   Vital Signs    Vitals:   11/02/18 1430 11/02/18 1445 11/02/18 1500 11/02/18 1515  BP: 91/77 98/69 95/76  98/80  Pulse: (!) 58 (!) 59 (!) 59 71  Resp: 17 20 18  (!) 22  Temp:       TempSrc:      SpO2: 98% 96% 98% 98%  Weight:      Height:        Intake/Output Summary (Last 24 hours) at 11/02/2018 1532 Last data filed at 11/02/2018 1500 Gross per 24 hour  Intake 872.89 ml  Output -260 ml  Net 1132.89 ml   Last 3 Weights 10/30/2018 10/30/2018 10/30/2018  Weight (lbs) 190 lb 11.2 oz 194 lb 0.1 oz 194 lb 0.1 oz  Weight (kg) 86.5 kg 88 kg 88 kg      Telemetry    Normal sinus rhythm- Personally Reviewed  ECG     - Personally Reviewed  Physical Exam   GEN: No acute distress.   Neck: No JVD Cardiac: RRR, no murmurs, rubs, or gallops.  Respiratory: Clear to auscultation bilaterally. GI: Soft, nontender, non-distended  MS: No edema; No deformity. Neuro:  Nonfocal  Psych: Normal affect   Labs    High Sensitivity Troponin:   Recent Labs  Lab 10/30/18 1721 10/30/18 2316 10/31/18 0440 10/31/18 2133 10/31/18 2331  TROPONINIHS 318* 435* 459* 442* 423*      Chemistry Recent Labs  Lab 10/30/18 0216  10/31/18 0440 11/01/18 0557 11/01/18 1724 11/02/18 0624  NA 135   < > 136 132* 130* 132*  K 4.9   < > 4.4  5.6* 5.2* 5.4*  CL 94*   < > 96* 93* 91* 91*  CO2 25   < > 26 24 22 24   GLUCOSE 104*   < > 89 135* 137* 107*  BUN 60*   < > 42* 63* 61* 77*  CREATININE 7.25*   < > 5.72* 7.10* 6.36* 7.78*  CALCIUM 8.6*   < > 8.3* 8.1* 7.8* 7.8*  PROT 9.3*  --   --   --   --   --   ALBUMIN 3.8  --  3.2* 3.3*  --   --   AST 26  --   --   --   --   --   ALT 18  --   --   --   --   --   ALKPHOS 212*  --   --   --   --   --   BILITOT 1.7*  --   --   --   --   --   GFRNONAA 7*   < > 10* 7* 9* 7*  GFRAA 8*   < > 11* 9* 10* 8*  ANIONGAP 16*   < > 14 15 17* 17*   < > = values in this interval not displayed.     Hematology Recent Labs  Lab 10/31/18 0440 11/01/18 0557 11/02/18 0624  WBC 11.1* 7.1 6.9  RBC 3.54* 3.23* 3.25*  HGB 12.1* 11.1* 11.3*  HCT 36.8* 32.8* 32.7*  MCV 104.0* 101.5* 100.6*  MCH 34.2* 34.4* 34.8*  MCHC 32.9 33.8 34.6  RDW  14.2 14.0 13.8  PLT 245 241 242    BNPNo results for input(s): BNP, PROBNP in the last 168 hours.   DDimer No results for input(s): DDIMER in the last 168 hours.   Radiology    No results found.  Cardiac Studies   Cardiac catheterization performed November 01, 2018 stent to the PDA Cardiac catheterization October 30, 2018  Echocardiogram performed October 30, 2018  1. Left ventricular ejection fraction, by visual estimation, is 50 to 55%. The left ventricle has normal function. Normal left ventricular size. There is no left ventricular hypertrophy. --- apical akinesis, mid to apical anteroseptal akinesis. Left atrial size was mild-moderately dilated.  Patient Profile     63 y/o man with ESRD admitted with anterior STEMI 2 days ago, complicated by continued chest pain requiring repeat cath and PCI to rPDA  and atrial fibrillation.    Assessment & Plan    STEMI October 26 with STEMI, PCI to the LAD x2 October 28 with PCI to PDA On aspirin beta-blocker statin Brilinta Significant small vessel residual disease  Paroxysmal atrial fibrillation Started on amiodarone infusion, converted to normal sinus rhythm 3:23 AM in the morning -Maintaining normal sinus rhythm Blood pressure running low, making it difficult for hemodialysis On carvedilol 3.125 mg twice daily Recommend we transition amiodarone infusion to oral amiodarone 400 twice daily  Chronic renal failure on hemodialysis Having hemodialysis today, plan for hemodialysis tomorrow And goes back to regular schedule Monday Wednesday Friday     Total encounter time more than 35 minutes  Greater than 50% was spent in counseling and coordination of care with the patient   For questions or updates, please contact [facility] Please consult [email or web address] for contact info under        Signed, Ida Rogue, MD  11/02/2018, 3:32 PM

## 2018-11-02 NOTE — Progress Notes (Signed)
This note also relates to the following rows which could not be included: Pulse Rate - Cannot attach notes to unvalidated device data Resp - Cannot attach notes to unvalidated device data SpO2 - Cannot attach notes to unvalidated device data    11/02/18 1720  Vital Signs  Pulse Rate Source Monitor  BP 118/83  During Hemodialysis Assessment  Blood Flow Rate (mL/min) 250 mL/min  Arterial Pressure (mmHg) -190 mmHg  Venous Pressure (mmHg) 200 mmHg  Transmembrane Pressure (mmHg) 70 mmHg  Ultrafiltration Rate (mL/min) 360 mL/min  Dialysate Flow Rate (mL/min) 500 ml/min  Conductivity: Machine  13.8  HD Safety Checks Performed Yes  KECN 49 KECN  Dialysis Fluid Bolus Normal Saline  Bolus Amount (mL) 250 mL  Intra-Hemodialysis Comments Tolerated well;Tx completed  Post-Hemodialysis Assessment  Rinseback Volume (mL) 250 mL  KECN 49 V  Dialyzer Clearance Clear  Duration of HD Treatment -hour(s) 3.5 hour(s)  Hemodialysis Intake (mL) 600 mL  UF Total -Machine (mL) 1500 mL  Net UF (mL) 900 mL  Tolerated HD Treatment Yes  AVG/AVF Arterial Site Held (minutes) 10 minutes  AVG/AVF Venous Site Held (minutes) 10 minutes  TOLERATED HD WELL NO C/OS NO DISTRESS NOTED, AVF +/+ UFG 1500ML PT IN BED

## 2018-11-02 NOTE — Progress Notes (Signed)
   11/02/18 1315  Neurological  Level of Consciousness Alert  Orientation Level Oriented X4  Respiratory  Respiratory Pattern Regular;Unlabored  Cardiac  Pulse Regular  ECG Monitor Yes  Cardiac Rhythm SB  pt sitting up in chair RN NT assisted back to bed for tx no c/os no distress noted. AVF +/+ vitals stable ufg 2L.

## 2018-11-02 NOTE — Progress Notes (Signed)
   11/02/18 1350  Vital Signs  Pulse Rate (!) 59  Pulse Rate Source Monitor  Resp (!) 22  BP 106/78  Oxygen Therapy  SpO2 100 %  During Hemodialysis Assessment  Blood Flow Rate (mL/min) 250 mL/min  Arterial Pressure (mmHg) -150 mmHg  Venous Pressure (mmHg) 200 mmHg  Transmembrane Pressure (mmHg) 70 mmHg  Ultrafiltration Rate (mL/min) 710 mL/min  Dialysate Flow Rate (mL/min) 500 ml/min  Conductivity: Machine  13.8  HD Safety Checks Performed Yes  Dialysis Fluid Bolus Normal Saline  Bolus Amount (mL) 250 mL  Intra-Hemodialysis Comments Tx initiated;Progressing as prescribed;Tolerated well  HD TX INITIATED PT IN BED USING AVF +/+ FUNCTIONING WELL VITALS STABLE NO C/OS NO DISTRESS NOTED. UFG 2L.

## 2018-11-03 ENCOUNTER — Other Ambulatory Visit: Payer: Self-pay

## 2018-11-03 DIAGNOSIS — I2102 ST elevation (STEMI) myocardial infarction involving left anterior descending coronary artery: Secondary | ICD-10-CM | POA: Diagnosis not present

## 2018-11-03 DIAGNOSIS — I255 Ischemic cardiomyopathy: Secondary | ICD-10-CM | POA: Diagnosis not present

## 2018-11-03 DIAGNOSIS — I480 Paroxysmal atrial fibrillation: Secondary | ICD-10-CM | POA: Diagnosis not present

## 2018-11-03 DIAGNOSIS — N186 End stage renal disease: Secondary | ICD-10-CM | POA: Diagnosis not present

## 2018-11-03 LAB — CBC
HCT: 35.2 % — ABNORMAL LOW (ref 39.0–52.0)
Hemoglobin: 11.7 g/dL — ABNORMAL LOW (ref 13.0–17.0)
MCH: 34.8 pg — ABNORMAL HIGH (ref 26.0–34.0)
MCHC: 33.2 g/dL (ref 30.0–36.0)
MCV: 104.8 fL — ABNORMAL HIGH (ref 80.0–100.0)
Platelets: 263 10*3/uL (ref 150–400)
RBC: 3.36 MIL/uL — ABNORMAL LOW (ref 4.22–5.81)
RDW: 14.1 % (ref 11.5–15.5)
WBC: 9.7 10*3/uL (ref 4.0–10.5)
nRBC: 0.3 % — ABNORMAL HIGH (ref 0.0–0.2)

## 2018-11-03 LAB — BASIC METABOLIC PANEL
Anion gap: 18 — ABNORMAL HIGH (ref 5–15)
BUN: 52 mg/dL — ABNORMAL HIGH (ref 8–23)
CO2: 26 mmol/L (ref 22–32)
Calcium: 8 mg/dL — ABNORMAL LOW (ref 8.9–10.3)
Chloride: 91 mmol/L — ABNORMAL LOW (ref 98–111)
Creatinine, Ser: 5.47 mg/dL — ABNORMAL HIGH (ref 0.61–1.24)
GFR calc Af Amer: 12 mL/min — ABNORMAL LOW (ref >60)
GFR calc non Af Amer: 10 mL/min — ABNORMAL LOW (ref >60)
Glucose, Bld: 85 mg/dL (ref 70–99)
Potassium: 4.2 mmol/L (ref 3.5–5.1)
Sodium: 135 mmol/L (ref 135–145)

## 2018-11-03 LAB — MAGNESIUM: Magnesium: 1.7 mg/dL (ref 1.7–2.4)

## 2018-11-03 LAB — HEPARIN LEVEL (UNFRACTIONATED): Heparin Unfractionated: <0.1 IU/mL — ABNORMAL LOW (ref 0.30–0.70)

## 2018-11-03 LAB — HEMOGLOBIN AND HEMATOCRIT, BLOOD
HCT: 37.2 % — ABNORMAL LOW (ref 39.0–52.0)
Hemoglobin: 12.3 g/dL — ABNORMAL LOW (ref 13.0–17.0)

## 2018-11-03 MED ORDER — POLYVINYL ALCOHOL 1.4 % OP SOLN
1.0000 [drp] | OPHTHALMIC | Status: DC
  Administered 2018-11-04 – 2018-11-07 (×12): 1 [drp] via OPHTHALMIC
  Filled 2018-11-03 (×2): qty 15

## 2018-11-03 MED ORDER — HEPARIN SODIUM (PORCINE) 5000 UNIT/ML IJ SOLN
5000.0000 [IU] | Freq: Three times a day (TID) | INTRAMUSCULAR | Status: DC
  Administered 2018-11-03 – 2018-11-04 (×3): 5000 [IU] via SUBCUTANEOUS
  Filled 2018-11-03 (×3): qty 1

## 2018-11-03 MED ORDER — HEPARIN BOLUS VIA INFUSION
2500.0000 [IU] | INTRAVENOUS | Status: AC
  Administered 2018-11-03: 2500 [IU] via INTRAVENOUS
  Filled 2018-11-03: qty 2500

## 2018-11-03 MED ORDER — MAGNESIUM SULFATE 2 GM/50ML IV SOLN: 2.0000 g | Freq: Once | INTRAVENOUS | Status: DC

## 2018-11-03 MED ORDER — CARVEDILOL 3.125 MG PO TABS
3.1250 mg | ORAL_TABLET | Freq: Every day | ORAL | Status: DC
  Administered 2018-11-03 – 2018-11-06 (×4): 3.125 mg via ORAL
  Filled 2018-11-03 (×4): qty 1

## 2018-11-03 NOTE — Progress Notes (Signed)
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  Patient ruled in for STEMI.  Emergent cardiac cath performed with successful PCI/DES x 2 to proximal and mid LAD.  On November 01, 2018 patient underwent repeat cath due to recurrent angina with  PCI to right PDA.  Patient had sustained VT, new onset PAF with RVR. Patient continues to have intermittent chest pain. Per cardiologist's note patient has significant small vessel residual disease.    EDUCATION:   Rounded on patient.  Patient still receiving Hemodialysis in room.  Dialysis RN allowed this RN to speak with patient.  "Heart Attack Bouncing Back"  educational booklet given to patient.  Briefly talked with patient about CAD, risk factor modification for CAD, importance of taking medications as prescribed, following heart healthy diet.  Patient informed this RN he has been on hemodialysis for 32 years.  Patient lives in [place] and is dialyzed three times a week at the [facility] in [place], [place].  [facility]    Briefly spoke with patient about Cardiac Rehab.  Explained to patient his cardiologist has referred him to outpatient Cardiac Rehab at [facility].  An overview of the program was provided.  Informational letter and program brochure provided to patient.  Patient is interested in participating but would like to talk with his daughter, as she is the one who would bring him to the program.  Patient also explained that he is limited with his exercise due to neuropathy in his legs / feet.  Patient is agreeable to having he Cardiac Rehab department contact him by phone in one to two weeks after discharge to discuss scheduling his first appointment.    Patient appreciative of the above information.    Roanna Epley, RN, BSN, [place]  Cardiac & Pulmonary Rehab  Cardiovascular & Pulmonary Nurse Navigator  Direct Line: [phone]  Department Phone #: [phone] Fax: [phone]  Email Address: [email or web address]

## 2018-11-03 NOTE — Progress Notes (Signed)
PT Cancellation Note  Patient Details Name: William Carpenter MRN: [id number] DOB: [birth year]   Cancelled Treatment:    Reason Eval/Treat Not Completed: Patient at procedure or test/unavailable(Patient at HD. Will re-attempt at later time/date if medically appropriate)   Everlean Alstrom. Graylon Good, PT, DPT 11/03/18, 1:41 PM

## 2018-11-03 NOTE — Consult Note (Signed)
[facility] for Electrolyte Monitoring and Replacement   Recent Labs: Potassium (mmol/L)  Date Value  11/03/2018 4.2  06/16/2013 4.4   Magnesium (mg/dL)  Date Value  11/03/2018 1.7   Calcium (mg/dL)  Date Value  11/03/2018 8.0 (L)   Calcium, Total (mg/dL)  Date Value  06/16/2013 8.9   Albumin (g/dL)  Date Value  11/01/2018 3.3 (L)  06/16/2013 3.3 (L)   Phosphorus (mg/dL)  Date Value  11/01/2018 3.6   Sodium (mmol/L)  Date Value  11/03/2018 135  06/16/2013 141   Corrected Ca: 8.56 mg/dL  Assessment: 63 y.o. male with a history of ESRD secondary to FSGS, HTN, HLD, and chronic pain, admitted with anterior STEMI  Goals of Therapy:  Magnesium ~ 2. All other electrolytes within normal limits.   Plan:   Replace Mg with 2 grams IV magnesium sulfate  Will defer potassium replacement to nephrology.   Re-check electrolytes in am  Sallye Lat ,PharmD Candidate 11/03/2018 1:35 PM

## 2018-11-03 NOTE — Progress Notes (Addendum)
Pt observed to be coughing more and was encouraged to not drink and eat in bed with HOB lowered. SpO2 currently 100% on on [place] 2L/min.   11/03/18 1445  Vital Signs  Pulse Rate 74  Resp (!) 23  BP (!) 151/89  Oxygen Therapy  SpO2 98 %  O2 Device Room Air  Pain Assessment  Pain Scale 0-10  Pain Score 0  During Hemodialysis Assessment  Blood Flow Rate (mL/min) 285 mL/min  Arterial Pressure (mmHg) -190 mmHg  Venous Pressure (mmHg) 250 mmHg  Transmembrane Pressure (mmHg) 60 mmHg  Ultrafiltration Rate (mL/min) 570 mL/min  Dialysate Flow Rate (mL/min) 600 ml/min  Conductivity: Machine  13.7  HD Safety Checks Performed Yes  Intra-Hemodialysis Comments Progressing as prescribed

## 2018-11-03 NOTE — Progress Notes (Signed)
Pre HD Note:  Pt received in ICU 14, A&O x 4 and denies pain or discomfort. Assessed pt access. No s/s of infection present. Pt has had extensive stinting to AVF. Site accessed and x initiated without complication. Pt meal tray delivered by dietary, pt encouraged to hold off on eating lunch d/t potential complications with BP; pt is agreeable to waiting.    11/03/18 1230  Vital Signs  Temp 97.8 F (36.6 C)  Temp Source Oral  Pulse Rate 64  Pulse Rate Source Dinamap  Resp 19  BP 122/87  BP Location Right Arm  BP Method Automatic  Patient Position (if appropriate) Lying  Oxygen Therapy  SpO2 99 %  O2 Device Room Air  Pain Assessment  Pain Scale 0-10  Pain Score 0  Dialysis Weight  Weight 91.1 kg  Type of Weight Pre-Dialysis  Time-Out for Hemodialysis  What Procedure? HD   Pt Identifiers(min of two) First/Last Name;MRN/Account#;Pt's DOB(use if MRN/Acct# not available  Correct Site? Yes  Correct Side? Yes  Correct Procedure? Yes  Consents Verified? Yes  Rad Studies Available? N/A  Safety Precautions Reviewed? Yes  Engineer, civil (consulting) Number 3  Station Number  (ICU 14)  UF/Alarm Test Passed  Conductivity: Meter 14  Conductivity: Machine  13.8  pH 7.4  Reverse Osmosis WRO # 4  Normal Saline Lot Number [id number]  Dialyzer Lot Number [id number]  Disposable Set Lot Number [id number]  Machine Temperature 98.6 F (37 C)  Musician and Audible Yes  Blood Lines Intact and Secured Yes  Pre Treatment Patient Checks  Vascular access used during treatment Fistula  HD catheter dressing before treatment  (n/a)  Patient is receiving dialysis in a chair  (no)  Hepatitis B Surface Antigen Results Negative  Date Hepatitis B Surface Antigen Drawn 10/30/18  Isolation Initiated  (no)  Hepatitis B Surface Antibody  (>1,000)  Date Hepatitis B Surface Antibody Drawn 10/30/18  Hemodialysis Consent Verified Yes  Hemodialysis Standing Orders Initiated Yes  ECG (Telemetry) Monitor  On Yes  Prime Ordered Normal Saline  Length of  DialysisTreatment -hour(s) 3.5 Hour(s)  Dialysis Treatment Comments  (Na 140)  Dialyzer Elisio 17H NR  Dialysate 2K;2.5 Ca  Dialysate Flow Ordered 600  Blood Flow Rate Ordered 400 mL/min  Ultrafiltration Goal 1.5 Liters  Dialysis Blood Pressure Support Ordered Normal Saline  Education / Care Plan  Dialysis Education Provided Yes  Documented Education in Care Plan Yes  Outpatient Plan of Care Reviewed and on Chart Yes  Fistula / Graft Left Forearm Arteriovenous fistula  No Placement Date or Time found.   Placed prior to admission: Yes  Orientation: Left  Access Location: Forearm  Access Type: Arteriovenous fistula  Site Condition No complications  Fistula / Graft Assessment Bruit;Thrill;Present  Status Accessed  Needle Size 15  Drainage Description None

## 2018-11-03 NOTE — Progress Notes (Signed)
Pt was up to [facility] - had loose maroon colored stool. Pt VSS and asymptomatic.  Notified Dr Fritzi Mandes.  She ordered stat HGB and if that is stable to go ahead and move pt to 2A as planned (had gotten transfer order from Dr Brett Albino at [phone]).  Also repeat CBC in the am.

## 2018-11-03 NOTE — Progress Notes (Signed)
PT Cancellation Note  Patient Details Name: KHYRE GERMOND MRN: [id number] DOB: [birth year]   Cancelled Treatment:    Reason Eval/Treat Not Completed: Medical issues which prohibited therapy(PT order received and chart reviewed. Held initial evaluation second to RN reports patient having chest pain and awaiting cardiology consult. Will continue to monitor and re-attempt PT at later date/time when patient is medically ready.)  Everlean Alstrom. Graylon Good, PT, DPT 11/03/18, 9:55 AM

## 2018-11-03 NOTE — Progress Notes (Signed)
HD Tx Completed:   11/03/18 1615  Vital Signs  Temp 97.7 F (36.5 C)  Temp Source Oral  Pulse Rate 71  Pulse Rate Source Monitor  Resp 19  BP 118/83  BP Location Right Arm  BP Method Automatic  Patient Position (if appropriate) Lying  Oxygen Therapy  SpO2 100 %  O2 Device Nasal Cannula  O2 Flow Rate (L/min) 2 L/min  Pain Assessment  Pain Scale 0-10  Pain Score 0  During Hemodialysis Assessment  KECN 55.4 KECN  Dialysis Fluid Bolus Normal Saline  Bolus Amount (mL) 250 mL  Intra-Hemodialysis Comments Tx completed;Tolerated well

## 2018-11-03 NOTE — Progress Notes (Signed)
OT Cancellation Note  Patient Details Name: William Carpenter MRN: [id number] DOB: Nov 28, 1955   Cancelled Treatment:    Reason Eval/Treat Not Completed: Medical issues which prohibited therapy. Thank you for the OT consult. Order received and chart reviewed. This Chief Strategy Officer spoke with pt primary RN who stated pt is endorsing lightheadedness, dizziness, and chest pain this am. Per RN he is awaiting evaluation by cardiologist. Will hold OT evaluation at this time and follow remotely. Will re-attempt at a later date/time as available and pt medically appropriate for OT evaluation.  Shara Blazing, M.S., OTR/L Ascom: [phone] 11/03/18, 9:30 AM

## 2018-11-03 NOTE — Progress Notes (Signed)
Pre HD Assessment:    11/03/18 1231  Neurological  Level of Consciousness Alert  Orientation Level Oriented X4  Respiratory  Respiratory Pattern Regular  Chest Assessment Chest expansion symmetrical  Bilateral Breath Sounds Clear;Diminished  Cardiac  Pulse Regular  Heart Sounds S1, S2  Cardiac Rhythm NSR  Vascular  R Radial Pulse +2  L Radial Pulse +2  Integumentary  Integumentary (WDL) X  GU Assessment  Genitourinary (WDL) X  Genitourinary Symptoms Anuria  Psychosocial  Psychosocial (WDL) WDL  Patient Behaviors Calm;Cooperative

## 2018-11-03 NOTE — Progress Notes (Signed)
[facility] NAME: William Carpenter    MR#:  [id number]  DATE OF BIRTH:  1955/10/03  SUBJECTIVE:   Patient states he is having occasional "chest heaviness" whenever he moves around.  Per RN, he also had an episode of dizziness and "tunnel vision", which has completely resolved.  REVIEW OF SYSTEMS:  Review of Systems  Constitutional: Negative for chills and fever.  HENT: Negative for congestion and sore throat.   Eyes: Negative for blurred vision and double vision.  Respiratory: Negative for cough and shortness of breath.   Cardiovascular: Positive for chest pain. Negative for palpitations and leg swelling.  Gastrointestinal: Negative for nausea and vomiting.  Genitourinary: Negative for dysuria and urgency.  Musculoskeletal: Negative for back pain and neck pain.  Neurological: Positive for dizziness. Negative for headaches.  Psychiatric/Behavioral: Negative for depression. The patient is not nervous/anxious.    DRUG ALLERGIES:   Allergies  Allergen Reactions  . Albumin (Human) Hives  . Aloe   . Erythromycin Hives  . Iodinated Diagnostic Agents Hives  . Iron Dextran   . Metoprolol Tartrate   . Penicillins Swelling  . Quinine Sulfate [Quinine]   . Rabeprazole   . Sulfa Antibiotics Other (See Comments)  . Tape Other (See Comments)    Removes skin.  . Trimethoprim    VITALS:  Blood pressure 133/81, pulse 65, temperature 97.8 F (36.6 C), temperature source Oral, resp. rate (!) 21, height 5\' 9"  (1.753 m), weight 91.1 kg, SpO2 95 %. PHYSICAL EXAMINATION:  Physical Exam  GENERAL:   Sitting up in the chair with no acute distress.  HEENT: Head atraumatic, normocephalic. Pupils equal, round, reactive to light and accommodation. No scleral icterus. Extraocular muscles intact. Oropharynx and nasopharynx clear.  NECK:  Supple, no jugular venous distention. No thyroid enlargement. LUNGS: Lungs are clear to auscultation  bilaterally. No wheezes, crackles, rhonchi. No use of accessory muscles of respiration.  CARDIOVASCULAR: Bradycardic, regular rhythm, S1, S2 normal. No murmurs, rubs, or gallops.  ABDOMEN: Soft, nontender, nondistended. Bowel sounds present.  EXTREMITIES: No pedal edema, cyanosis, or clubbing.  NEUROLOGIC: CN 2-12 intact, no focal deficits. 5/5 muscle strength throughout all extremities. Sensation intact throughout. Gait not checked.  PSYCHIATRIC: The patient is alert and oriented x 3.  SKIN: No obvious rash, lesion, or ulcer.  LABORATORY PANEL:  Male CBC Recent Labs  Lab 11/03/18 0353  WBC 9.7  HGB 11.7*  HCT 35.2*  PLT 263   ------------------------------------------------------------------------------------------------------------------ Chemistries  Recent Labs  Lab 10/30/18 0216  11/03/18 0353  NA 135   < > 135  K 4.9   < > 4.2  CL 94*   < > 91*  CO2 25   < > 26  GLUCOSE 104*   < > 85  BUN 60*   < > 52*  CREATININE 7.25*   < > 5.47*  CALCIUM 8.6*   < > 8.0*  MG  --    < > 1.7  AST 26  --   --   ALT 18  --   --   ALKPHOS 212*  --   --   BILITOT 1.7*  --   --    < > = values in this interval not displayed.   RADIOLOGY:  No results found. ASSESSMENT AND PLAN:   Acute STEMI- having persistent episodes of chest pain. -s/p cardiac catheterization on 10/26 with DES to the proximal and mid LAD -repeat cardiac cath 10/28 with stable  disease and patent stents; rPDA was stented -CTA chest 10/27 showed CAD, but no dissection or PE -Cardiology following -Continue dual antiplatelet therapy with aspirin and brilinta for at least 12 months -ECHO with EF 50-55% -Continue Coreg and Lipitor -No ACE/ARB due to hypotension  A-fib with RVR- resolved, converted back to NSR. -Continue amiodarone 400 mg twice daily  -Continue low dose coreg -Heparin gtt has been stopped -Cardiac monitoring  ESRD on HD MWF -Nephrology following  Hyperlipidemia- LDL is 90 this admission  -Continue home Lipitor  Hypertension- patient did have some low BPs earlier this admission, likely due to cardiogenic shock. -Continue midodrine -Continue coreg -Holding home hydralazine and losartan  All the records are reviewed and case discussed with Care Management/Social Worker. Management plans discussed with the patient, family and they are in agreement.  CODE STATUS: Full Code  TOTAL TIME TAKING CARE OF THIS PATIENT: 40 minutes.   More than 50% of the time was spent in counseling/coordination of care: YES  POSSIBLE D/C IN 1-2 DAYS, DEPENDING ON CLINICAL CONDITION.   Berna Spare Charvis Lightner M.D on 11/03/2018 at 2:28 PM  Between 7am to 6pm - Pager [phone]  After 6pm go to [email or web address] - Proofreader  [facility]  Office  [phone]  CC: Primary care physician; Raelyn Mora, MD  Note: This dictation was prepared with Dragon dictation along with smaller phrase technology. Any transcriptional errors that result from this process are unintentional.

## 2018-11-03 NOTE — Progress Notes (Signed)
Post HD Assessment:    11/03/18 1231  Neurological  Level of Consciousness Alert  Orientation Level Oriented X4  Respiratory  Respiratory Pattern Regular  Chest Assessment Chest expansion symmetrical  Bilateral Breath Sounds Clear;Diminished  Cardiac  Pulse Regular  Heart Sounds S1, S2  Cardiac Rhythm NSR  Vascular  R Radial Pulse +2  L Radial Pulse +2  Integumentary  Integumentary (WDL) X  GU Assessment  Genitourinary (WDL) X  Genitourinary Symptoms Anuria  Psychosocial  Psychosocial (WDL) WDL  Patient Behaviors Calm;Cooperative

## 2018-11-03 NOTE — Progress Notes (Signed)
HD Initiated:    11/03/18 1245  Vital Signs  Temp 97.8 F (36.6 C)  Temp Source Oral  Pulse Rate 64  Pulse Rate Source Dinamap  Resp (!) 25  BP (!) 123/94  BP Location Right Arm  BP Method Automatic  Patient Position (if appropriate) Lying  Oxygen Therapy  SpO2 100 %  O2 Device Room Air  Pain Assessment  Pain Scale 0-10  Pain Score 0  During Hemodialysis Assessment  Blood Flow Rate (mL/min) 250 mL/min  Arterial Pressure (mmHg) -150 mmHg  Venous Pressure (mmHg) 130 mmHg  Transmembrane Pressure (mmHg) 60 mmHg  Ultrafiltration Rate (mL/min) 570 mL/min  Dialysate Flow Rate (mL/min) 600 ml/min  Conductivity: Machine  13.8  HD Safety Checks Performed Yes  Dialysis Fluid Bolus Normal Saline  Bolus Amount (mL) 250 mL  Intra-Hemodialysis Comments Tx initiated

## 2018-11-03 NOTE — Progress Notes (Signed)
ANTICOAGULATION CONSULT NOTE  Pharmacy Consult for Heparin Indication: chest pain/ACS  Patient Measurements: Height: 5\' 9"  (175.3 cm) Weight: 200 lb 13.4 oz (91.1 kg) IBW/kg (Calculated) : 70.7 HEPARIN DW (KG): 88  Vital Signs: Temp: 97.8 F (36.6 C) (10/30 1245) Temp Source: Oral (10/30 1245) BP: 124/85 (10/30 1330) Pulse Rate: 65 (10/30 1330)  Labs: Recent Labs    10/31/18 2133 10/31/18 2240 10/31/18 2331  11/01/18 0557 11/01/18 1724 11/02/18 0624 11/02/18 2020 11/03/18 0353  HGB  --   --   --    < > 11.1*  --  11.3*  --  11.7*  HCT  --   --   --   --  32.8*  --  32.7*  --  35.2*  PLT  --   --   --   --  241  --  242  --  263  APTT  --  36  --   --   --   --   --   --   --   LABPROT  --  14.2  --   --   --   --   --   --   --   INR  --  1.1  --   --   --   --   --   --   --   HEPARINUNFRC  --   --   --    < > 0.37  --  0.22* <0.10* <0.10*  CREATININE  --   --   --    < > 7.10* 6.36* 7.78*  --  5.47*  TROPONINIHS 442*  --  423*  --   --   --   --   --   --    < > = values in this interval not displayed.    Estimated Creatinine Clearance: 15.4 mL/min (A) (by C-G formula based on SCr of 5.47 mg/dL (H)).   Medical History: Past Medical History:  Diagnosis Date  . Bullous dermatosis   . Dialysis patient ([facility])   . Dysphagia   . FSGS (focal segmental glomerulosclerosis)   . Hyperlipidemia   . Renal insufficiency   . Spinal stenosis, cervical region     Medications:  Medications Prior to Admission  Medication Sig Dispense Refill Last Dose  . amLODipine (NORVASC) 5 MG tablet Take 5 mg by mouth daily.    10/29/2018 at 0900  . atorvastatin (LIPITOR) 40 MG tablet Take 20 mg by mouth daily.    10/29/2018 at 2100  . b complex-vitamin c-folic acid (NEPHRO-VITE) 0.8 MG TABS tablet Take 1 tablet by mouth at bedtime.   10/29/2018 at 2100  . calcium acetate (PHOSLO) 667 MG capsule Take 1,334 mg by mouth 3 (three) times daily with meals.    10/29/2018 at 1800  .  carboxymethylcellulose (REFRESH PLUS) 0.5 % SOLN 1 drop every 2 (two) hours while awake.   prn at prn  . carvedilol (COREG) 25 MG tablet Take 25 mg by mouth 2 (two) times daily with a meal.    10/29/2018 at 1800  . cetirizine (ZYRTEC) 10 MG tablet Take 10 mg by mouth daily.   10/29/2018 at 2100  . cinacalcet (SENSIPAR) 90 MG tablet Take 90 mg by mouth daily.   10/29/2018 at 0900  . cycloSPORINE (RESTASIS) 0.05 % ophthalmic emulsion Place 1 drop into both eyes 2 (two) times daily.   10/29/2018 at 2000  . docusate sodium (COLACE) 100 MG capsule Take 100 mg by  mouth daily.   10/29/2018 at 0900  . dorzolamidel-timolol (COSOPT) 22.3-6.8 MG/ML SOLN ophthalmic solution 1 drop 2 (two) times daily.   10/29/2018 at 2100  . hydrALAZINE (APRESOLINE) 10 MG tablet Take 10 mg by mouth every 4 (four) hours as needed (systolic BP >[id number]).    10/29/2018 at 1500  . hydrOXYzine (VISTARIL) 25 MG capsule Take 25 mg by mouth 3 (three) times daily as needed.   10/29/2018 at 2100  . ketoconazole (NIZORAL) 2 % shampoo Apply 1 application topically 2 (two) times a week.   Past Week at Unknown time  . lactulose (CHRONULAC) 10 GM/15ML solution Take by mouth 3 (three) times daily.   10/29/2018 at 2100  . latanoprost (XALATAN) 0.005 % ophthalmic solution Place 1 drop into both eyes at bedtime.   10/29/2018 at 2100  . losartan (COZAAR) 50 MG tablet Take 50 mg by mouth daily. Take one Tuesday, Thursday, Saturday, Sunday   10/29/2018 at 0900  . mirtazapine (REMERON) 15 MG tablet Take 15 mg by mouth at bedtime.   10/29/2018 at 2100  . omeprazole (PRILOSEC) 20 MG capsule Take 20 mg by mouth 2 (two) times daily before a meal.   10/29/2018 at 1700  . Psyllium (KONSYL-D PO) Take 5 mLs by mouth 2 (two) times daily.   10/29/2018 at 1800  . senna-docusate (SENOKOT-S) 8.6-50 MG tablet Take 1 tablet by mouth 2 (two) times daily as needed (to prevent constipation; scheduled until bowel movement).    10/29/2018 at 2100  . sertraline (ZOLOFT)  50 MG tablet Take 150 mg by mouth at bedtime.   10/29/2018 at 2100  . sevelamer carbonate (RENVELA) 0.8 g PACK packet Take 0.8 g by mouth 3 (three) times daily with meals.   10/29/2018 at 1800  . acetaminophen (TYLENOL) 325 MG tablet Take 650 mg by mouth every 8 (eight) hours as needed (pain or fever).    prn at prn  . betamethasone dipropionate (DIPROLENE) 0.05 % ointment Apply topically 2 (two) times daily.   prn at prn  . bisacodyl (DULCOLAX) 10 MG suppository Place 10 mg rectally daily as needed for moderate constipation.   prn at prn  . Carboxymethylcellul-Glycerin (REFRESH OPTIVE SENSITIVE OP) Place 1 drop into both eyes every 4 (four) hours.   prn at prn  . clobetasol ointment (TEMOVATE) 2.97 % Apply 1 application topically 2 (two) times daily.   prn at prn  . Melatonin 5 MG CAPS Take by mouth.   prn at prn  . methocarbamol (ROBAXIN) 500 MG tablet Take 500 mg by mouth 2 (two) times daily.    Completed Course at Unknown time  . mupirocin cream (BACTROBAN) 2 % Apply 1 application topically 2 (two) times daily. (Patient not taking: Reported on 10/30/2018) 30 g 0 Completed Course at Unknown time  . naloxone (NARCAN) nasal spray 4 mg/0.1 mL Place 1 spray into the nose.   prn at prn  . oxycodone (OXY-IR) 5 MG capsule Take 5-10 mg by mouth every 4 (four) hours as needed for pain.    Completed Course at Unknown time  . polyvinyl alcohol (LIQUIFILM TEARS) 1.4 % ophthalmic solution Place 1 drop into both eyes 4 (four) times daily as needed for dry eyes.   prn at prn  . terbinafine (LAMISIL) 1 % cream Apply 1 application topically 2 (two) times daily.   prn at prn   Assessment: Per NP note - Pt's EKG with ST Elevation in Inferior leads (appears more pronounced than on previous EKG) along  with A-fib w/ RVR (HR 110-130).  Pt's high sensitivity troponin 442 (previously 459). Pharmacy asked to initiate and monitor Heparin drip for ACS & A-fib. Baseline aPTT 36s, INR 1.1  Heparin Course: 10/30 0353 HL  <0.1, subtherapeutic  Goal of Therapy:  Heparin level 0.3-0.7 units/ml Monitor platelets by anticoagulation protocol: Yes   Plan:   Heparin drip has been stopped and patient transitioned to heparin 5000 units SQ Q8hr for DVT prophylaxis.   H&H trending lower, PLT stable: f/u CBC in am   Pharmacy will continue to monitor per anticoagulation policy.   Sallye Lat, PharmD Candidate 11/03/2018,1:44 PM

## 2018-11-03 NOTE — Progress Notes (Addendum)
Pt has remained in NSR throughout shift. Bp has been stable. Pt had an episode of chest heaviness, light headedness and tunnel vision this am. Myself and Arts administrator, assessed him and notified physician. Physician came by and assessed pt. Pt symptoms have completely resolved with no other complications. Patient is resting comfortably receiving dialysis at bedside. Daughter has been updated twice via phone.

## 2018-11-03 NOTE — Progress Notes (Signed)
Post HD Tx:    11/03/18 1630  Vital Signs  Temp 97.7 F (36.5 C)  Temp Source Oral  Pulse Rate 71  Pulse Rate Source Monitor  Resp 19  BP 109/80  BP Location Right Arm  BP Method Automatic  Patient Position (if appropriate) Lying  Oxygen Therapy  SpO2 100 %  O2 Device Nasal Cannula  O2 Flow Rate (L/min) 2 L/min  Pain Assessment  Pain Scale 0-10  Pain Score 0  Dialysis Weight  Weight 89.9 kg  Type of Weight Post-Dialysis  Post-Hemodialysis Assessment  Rinseback Volume (mL) 250 mL  KECN 55.4 V  Dialyzer Clearance Lightly streaked  Duration of HD Treatment -hour(s) 3.5 hour(s)  Hemodialysis Intake (mL) 500 mL  UF Total -Machine (mL) 2000 mL  Net UF (mL) 1500 mL  Tolerated HD Treatment Yes  AVG/AVF Arterial Site Held (minutes)  (8)  AVG/AVF Venous Site Held (minutes)  (8)  Fistula / Graft Left Forearm Arteriovenous fistula  No Placement Date or Time found.   Placed prior to admission: Yes  Orientation: Left  Access Location: Forearm  Access Type: Arteriovenous fistula  Site Condition No complications  Fistula / Graft Assessment Bruit;Thrill;Present  Status Deaccessed  Drainage Description None

## 2018-11-03 NOTE — Progress Notes (Signed)
Progress Note  Patient Name: William Carpenter Date of Encounter: 11/03/2018  Primary Cardiologist: New to [facility] - consult by End  Subjective   Coreg held on 10/29 to allow for slightly higher BP. Had brief episode of chest heaviness, dizziness, lightheadedness, and tunnel vision this morning. Repeat EKG was not acute. BP has been stable this morning, increased dose of midodrine was ordered, though not given. Feels better now.   Inpatient Medications    Scheduled Meds:  amiodarone  400 mg Oral BID   artificial tears   Both Eyes BID   aspirin  81 mg Oral Daily   atorvastatin  80 mg Oral Daily   calcium acetate  1,334 mg Oral TID WC   carvedilol  3.125 mg Oral BID WC   Chlorhexidine Gluconate Cloth  6 each Topical Q0600   cinacalcet  90 mg Oral Q breakfast   cycloSPORINE  1 drop Both Eyes BID   docusate sodium  100 mg Oral Daily   dorzolamide-timolol  1 drop Both Eyes BID   feeding supplement (NEPRO CARB STEADY)  237 mL Oral BID BM   hydroxypropyl methylcellulose / hypromellose  1 drop Both Eyes Q4H while awake   latanoprost  1 drop Both Eyes QHS   loratadine  10 mg Oral Daily   midodrine  10 mg Oral Once   mirtazapine  15 mg Oral QHS   multivitamin  1 tablet Oral QHS   pantoprazole  40 mg Oral Daily   sertraline  150 mg Oral QHS   sevelamer carbonate  0.8 g Oral TID WC   sodium chloride flush  3 mL Intravenous Q12H   sodium chloride flush  3 mL Intravenous Q12H   ticagrelor  90 mg Oral BID   Continuous Infusions:  sodium chloride 10 mL/hr at 10/30/18 0253   sodium chloride     heparin 1,600 Units/hr (11/03/18 0700)   PRN Meds: sodium chloride, acetaminophen, acetaminophen, Melatonin, methocarbamol, midodrine, morphine injection, nitroGLYCERIN, ondansetron (ZOFRAN) IV, oxyCODONE, senna-docusate, sodium chloride flush   Vital Signs    Vitals:   11/03/18 0400 11/03/18 0500 11/03/18 0600 11/03/18 0700  BP: 131/84 119/89 118/78 (!) 125/91   Pulse: 66 71 76 73  Resp: 19 18 16  (!) 24  Temp:      TempSrc:      SpO2: 96% 93% 93% 96%  Weight:      Height:        Intake/Output Summary (Last 24 hours) at 11/03/2018 1010 Last data filed at 11/03/2018 0700 Gross per 24 hour  Intake 555.42 ml  Output 900 ml  Net -344.58 ml   Filed Weights   10/30/18 0210 10/30/18 1400 10/30/18 1750  Weight: 88 kg 88 kg 86.5 kg    Telemetry    SR with PACs/PVCs, no sustained arrhythmias - Personally Reviewed  ECG    11/03/2018: NSR, 73 bpm, rare PAC, prior anteroseptal infarct, nonspecific inferior st elevation not meeting STEMI criteria, nonspecific st/t changes - Personally Reviewed  Physical Exam   GEN: Frail appearing; No acute distress.   Neck: No JVD. Cardiac: RRR, no murmurs, rubs, or gallops.  Respiratory: Clear to auscultation bilaterally.  GI: Soft, nontender, non-distended.   MS: No edema; No deformity. Neuro:  Alert and oriented x 3; Nonfocal.  Psych: Normal affect.  Labs    Chemistry Recent Labs  Lab 10/30/18 0216  10/31/18 0440 11/01/18 0557 11/01/18 1724 11/02/18 0624 11/03/18 0353  NA 135   < > 136 132*  130* 132* 135  K 4.9   < > 4.4 5.6* 5.2* 5.4* 4.2  CL 94*   < > 96* 93* 91* 91* 91*  CO2 25   < > 26 24 22 24 26   GLUCOSE 104*   < > 89 135* 137* 107* 85  BUN 60*   < > 42* 63* 61* 77* 52*  CREATININE 7.25*   < > 5.72* 7.10* 6.36* 7.78* 5.47*  CALCIUM 8.6*   < > 8.3* 8.1* 7.8* 7.8* 8.0*  PROT 9.3*  --   --   --   --   --   --   ALBUMIN 3.8  --  3.2* 3.3*  --   --   --   AST 26  --   --   --   --   --   --   ALT 18  --   --   --   --   --   --   ALKPHOS 212*  --   --   --   --   --   --   BILITOT 1.7*  --   --   --   --   --   --   GFRNONAA 7*   < > 10* 7* 9* 7* 10*  GFRAA 8*   < > 11* 9* 10* 8* 12*  ANIONGAP 16*   < > 14 15 17* 17* 18*   < > = values in this interval not displayed.     Hematology Recent Labs  Lab 11/01/18 0557 11/02/18 0624 11/03/18 0353  WBC 7.1 6.9 9.7  RBC 3.23*  3.25* 3.36*  HGB 11.1* 11.3* 11.7*  HCT 32.8* 32.7* 35.2*  MCV 101.5* 100.6* 104.8*  MCH 34.4* 34.8* 34.8*  MCHC 33.8 34.6 33.2  RDW 14.0 13.8 14.1  PLT 241 242 263    Cardiac EnzymesNo results for input(s): TROPONINI in the last 168 hours. No results for input(s): TROPIPOC in the last 168 hours.   BNPNo results for input(s): BNP, PROBNP in the last 168 hours.   DDimer No results for input(s): DDIMER in the last 168 hours.   Radiology    No results found.  Cardiac Studies   [facility] 10/30/2018: Conclusions: 1. Severe three-vessel coronary artery disease with heavy calcification, including sequential 70% and 99% proximal and mid LAD stenoses (culprit lesions), long segment of irregular 50% distal LAD stenosis, 50% ostial LCx stenosis, severe disease involving diagonal branches and distal portion of OM2, and focal 90% stenosis in the proximal rPDA. 2. Moderately to severely reduced left ventricular systolic function with mid and apical anterior, apical, and apical inferior hypokinesis/akinesis.  LVEF approximately 35%. 3. Mildly elevated left ventricular filling pressure. 4. Successful PCI to proximal and mid LAD using overlapping Synergy 3.5 x 16 mm and 3.0 x 28 mm drug-eluting stents with less than 10% residual stenosis and TIMI-3 flow.  Recommendations: 1. Continue cangrelor infusion for 2 hours after PCI. 2. Dual antiplatelet therapy with aspirin and ticagrelor for at least 12 months, ideally longer given extensive multivessel CAD. 3. Favor medical therapy of distal LAD, diagonal, OM2, and rPDA disease, though if patient has refractory chest pain, PCI to rPDA could be considered. 4. Aggressive secondary prevention of CAD and evidence-based heart failure therapy for ischemic cardiomyopathy.  Carvedilol and losartan should be restarted carefully, as blood pressure allows. 5. Obtain transthoracic echocardiogram. 6. Remove right femoral artery sheath with manual compression when ACT has  fallen below 175 seconds. __________  2D echo 10/30/2018: 1. Left ventricular ejection fraction, by visual estimation, is 50 to 55%. The left ventricle has normal function. Normal left ventricular size. There is no left ventricular hypertrophy.  2. Definity contrast agent was given IV to delineate the left ventricular endocardial borders.  3. Left ventricular diastolic Doppler parameters are consistent with pseudonormalization pattern of LV diastolic filling.  4. There is apical akinesis, mid to apical anteroseptal akinesis.  5. Global right ventricle has normal systolic function.The right ventricular size is normal. Right vetricular wall thickness was not assessed.  6. Left atrial size was mild-moderately dilated.  7. Right atrial size was normal.  8. Mild calcification of the posterior mitral valve leaflet(s).  9. The mitral valve is degenerative. No evidence of mitral valve regurgitation. 10. The tricuspid valve is grossly normal. Tricuspid valve regurgitation was not visualized by color flow Doppler. 11. The aortic valve has an indeterminant number of cusps Aortic valve regurgitation was not visualized by color flow Doppler. Mild to moderate aortic valve sclerosis/calcification without any evidence of aortic stenosis. 12. The pulmonic valve was not well visualized. Pulmonic valve regurgitation is not visualized by color flow Doppler. 13. The inferior vena cava is normal in size with greater than 50% respiratory variability, suggesting right atrial pressure of 3 mmHg. __________  [facility] 11/01/2018: Conclusions: 1. Three-vessel coronary artery disease with heavy calcification, including long segment of irregular 50% distal LAD stenosis, 50% ostial LCx stenosis, severe disease involving diagonal branches and distal portion of OM2, and focal 90% stenosis in the proximal rPDA.  Appearance is similar to cardiac catheterization two days ago. 2. Patent overlapping proximal and mid LAD stents with  <10% stenosis in the proximal segment. 3. Moderately elevated left heart and pulmonary artery pressures. 4. Severely elevated right heart filling pressure. 5. Low normal to mildly reduced cardiac output/index. 6. Successful PCI to ostial/proximal rPDA using Resolute Onyx 2.25 x 15 mm drug-eluting stent (postdilated to 2.6 mm) with 0% residual stenosis and TIMI-3 flow.  Recommendations: 1. Continue dual antiplatelet therapy with aspirin and ticagrelor for at least 12 months.  If atrial fibrillation persists,  I suggest transitioning ticagrelor to clopidogrel, discontinuing aspirin, and starting apixaban 5 mg twice daily. 2. Aggressive secondary prevention. 3. Escalate evidence-based heart failure therapy, as tolerating. 4. Hemodialysis today with as much fluid removal as tolerated. 5. Restart heparin infusion in 8 hours.   Patient Profile     63 y.o. 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 Anterior ST elevation MI/CAD: -Currently chest pain free -Continue DAPT with ASA and Brilinta without interruption for at least the next 12 months -Patient has continued to note intermittent chest pain since his initial PCI leading to repeat cath with PCI to the rPDA as above as well as CTA chest/abdomen/pelvis which showed no acute pathology  -Continue secondary prevention with Coreg and Lipitor as below -Cardiac rehab -Post cath instructions   2. New onset Afib with RVR: -Spontaneously converted to sinus rhythm  -Maintaining sinus rhythm with frequent PACs/PVCs over the past 24 hours -No further episodes on telemetry  -Remains on heparin gtt -Coreg and amiodarone   3. WCT: -Patient developed WCT on 10/28, possibly atrial flutter with  aberrancy vs VT with concern electrolyte abnormalities were an underlying factor -Repeat EKG did not show any acute ischemic changes  -Arrhythmia was not felt to be in the setting of acute stent thrombosis  -He has been transitioned from IV amiodarone to PO amiodarone and continued on Coreg  -No further sustained arrhythmias on telemetry  -Recommend repletion of magnesium to goal of 2.0 -Potassium from from 5.4-->4.2 this morning  4. HFrEF secondary to ICM: -Volume managed by HD -LVEF ~ 35% by LV grame with an EF of 50-55% by echo this admission with apical AK consistent with LAD territory infarct  -Some degree of cardiomyopathy suspected given his ischemic heart disease -Continue Coreg, will use q PM given this is non-dialyzable  -ARB has been deferred given relative hypotension  -Not on MRA in the setting of ESRD -Recommend repeating limited echo as an outpatient in several months time following percutaneous revascularization and optimization of medications to reevaluate LVSF -CHF education   4. Anemia of chronic disease: -Stable  5. ESRD: -HD per nephrology  6. HLD: -LDL of 90 this admission with a goal LDL of < 70 -On Lipitor 80 mg daily -Recheck fasting lipid panel and liver function as an outpatient in ~ 8 weeks, if LDL remains above goal at that time, add Zetia 10 mg daily  7. Lightheadedness/dizziness/tunnel vision: -BP has been soft leading to holding of Coreg this morning -Self limiting and spontaneously resolved -Unlikely to be acute stent thrombosis given stable to improved EKG -Remains on midodrine for symptomatic hypotension -Symptoms possibly exacerbated by limitations of HD this admission with volume overload    For questions or updates, please contact [facility] Please consult [email or web address] for contact info under Cardiology/STEMI.    Signed, Christell Faith, PA-C [facility] Pager: [phone] 11/03/2018, 10:10 AM

## 2018-11-03 NOTE — Progress Notes (Signed)
[facility], [place] 11/03/18  Subjective:   LOS: 4   Intake/output 10/29 0701 - 10/30 0700 In: 555.4 [P.O.:300; I.V.:255.4] Out: 900   Patient known to our practice from previous admissions He presented to the emergency room for complaints of chest pain and fever. Diagnosed with STEMI. Underwent emergent left heart catheterization and Found to have severe multivessel coronary disease with culprit lesion involving proximal and mid LAD which was successfully treated with 2 overlapping drug-eluting stents.  Chest pain significantly improved after the procedure.  Underwent successful HD on Monday. 1500 cc removed  Patient did not tolerate hemodialysis on Wednesday.  Within 30 minutes, he became tachycardic with a heart rate of 150s.  He had arrhythmias.  Hemodialysis was stopped.  Tolerated hemodialysis well on Thursday with slow blood flow.  Today, he feels well.  He is sitting up getting ready to eat breakfast.  He is currently on room air.No shortness of breath.  Felt some heaviness in his chest last night which went away in a short period of time.     Objective:  Vital signs in last 24 hours:  Temp:  [97.6 F (36.4 C)-98.4 F (36.9 C)] 98.4 F (36.9 C) (10/30 0200) Pulse Rate:  [55-76] 73 (10/30 0700) Resp:  [11-24] 24 (10/30 0700) BP: (88-136)/(67-95) 125/91 (10/30 0700) SpO2:  [89 %-100 %] 96 % (10/30 0700)  Weight change:  Filed Weights   10/30/18 0210 10/30/18 1400 10/30/18 1750  Weight: 88 kg 88 kg 86.5 kg    Intake/Output:    Intake/Output Summary (Last 24 hours) at 11/03/2018 0831 Last data filed at 11/03/2018 0700 Gross per 24 hour  Intake 555.42 ml  Output 900 ml  Net -344.58 ml     Physical Exam: General:  No acute distress, sitting up in the bed  HEENT  anicteric, moist oral mucous membranes  Pulm/lungs  normal breathing effort on room air  CVS/Heart  irregular  Abdomen:   Soft, nontender  Extremities:  Trace edema   Neurologic:  Alert, oriented  Skin:  No acute rashes  Access:  Left forearm AV fistula       Basic Metabolic Panel:  Recent Labs  Lab 10/30/18 1516 10/31/18 0440 11/01/18 0557 11/01/18 1724 11/02/18 0624 11/03/18 0353  NA  --  136 132* 130* 132* 135  K  --  4.4 5.6* 5.2* 5.4* 4.2  CL  --  96* 93* 91* 91* 91*  CO2  --  26 24 22 24 26   GLUCOSE  --  89 135* 137* 107* 85  BUN  --  42* 63* 61* 77* 52*  CREATININE  --  5.72* 7.10* 6.36* 7.78* 5.47*  CALCIUM  --  8.3* 8.1* 7.8* 7.8* 8.0*  MG  --   --  1.8 1.8 1.8 1.7  PHOS 2.5 2.8 3.6  --   --   --      CBC: Recent Labs  Lab 10/30/18 0216 10/30/18 0510 10/31/18 0440 11/01/18 0557 11/02/18 0624 11/03/18 0353  WBC 12.7* 14.2* 11.1* 7.1 6.9 9.7  NEUTROABS 7.8*  --   --   --   --   --   HGB 13.5 12.8* 12.1* 11.1* 11.3* 11.7*  HCT 42.3 40.1 36.8* 32.8* 32.7* 35.2*  MCV 107.4* 106.4* 104.0* 101.5* 100.6* 104.8*  PLT 269 259 245 241 242 263      Lab Results  Component Value Date   HEPBSAG NON REACTIVE 10/30/2018      Microbiology:  Recent Results (  from the past 240 hour(s))  Blood Culture (routine x 2)     Status: None (Preliminary result)   Collection Time: 10/30/18  2:16 AM   Specimen: BLOOD  Result Value Ref Range Status   Specimen Description BLOOD RIGHT FOREARM  Final   Special Requests   Final    BOTTLES DRAWN AEROBIC AND ANAEROBIC Blood Culture adequate volume   Culture   Final    NO GROWTH 4 DAYS Performed at [facility], [address], [place], [place] [address]    Report Status PENDING  Incomplete  Blood Culture (routine x 2)     Status: None (Preliminary result)   Collection Time: 10/30/18  2:16 AM   Specimen: BLOOD  Result Value Ref Range Status   Specimen Description BLOOD RIGHT ANTECUBITAL  Final   Special Requests   Final    BOTTLES DRAWN AEROBIC AND ANAEROBIC Blood Culture adequate volume   Culture   Final    NO GROWTH 4 DAYS Performed at [facility], [address] [place], [place] [address]    Report Status PENDING  Incomplete  SARS Coronavirus 2 by RT PCR (hospital order, performed in [place] hospital lab) Nasopharyngeal Nasopharyngeal Swab     Status: None   Collection Time: 10/30/18  2:16 AM   Specimen: Nasopharyngeal Swab  Result Value Ref Range Status   SARS Coronavirus 2 NEGATIVE NEGATIVE Final    Comment: (NOTE) If result is NEGATIVE SARS-CoV-2 target nucleic acids are NOT DETECTED. The SARS-CoV-2 RNA is generally detectable in upper and lower  respiratory specimens during the acute phase of infection. The lowest  concentration of SARS-CoV-2 viral copies this assay can detect is 250  copies / mL. A negative result does not preclude SARS-CoV-2 infection  and should not be used as the sole basis for treatment or other  patient management decisions.  A negative result may occur with  improper specimen collection / handling, submission of specimen other  than nasopharyngeal swab, presence of viral mutation(s) within the  areas targeted by this assay, and inadequate number of viral copies  (<250 copies / mL). A negative result must be combined with clinical  observations, patient history, and epidemiological information. If result is POSITIVE SARS-CoV-2 target nucleic acids are DETECTED. The SARS-CoV-2 RNA is generally detectable in upper and lower  respiratory specimens dur ing the acute phase of infection.  Positive  results are indicative of active infection with SARS-CoV-2.  Clinical  correlation with patient history and other diagnostic information is  necessary to determine patient infection status.  Positive results do  not rule out bacterial infection or co-infection with other viruses. If result is PRESUMPTIVE POSTIVE SARS-CoV-2 nucleic acids MAY BE PRESENT.   A presumptive positive result was obtained on the submitted specimen  and confirmed on repeat testing.  While 2019 novel coronavirus  (SARS-CoV-2) nucleic acids may  be present in the submitted sample  additional confirmatory testing may be necessary for epidemiological  and / or clinical management purposes  to differentiate between  SARS-CoV-2 and other Sarbecovirus currently known to infect humans.  If clinically indicated additional testing with an alternate test  methodology [phone]) is advised. The SARS-CoV-2 RNA is generally  detectable in upper and lower respiratory sp ecimens during the acute  phase of infection. The expected result is Negative. Fact Sheet for Patients:  StrictlyIdeas.no Fact Sheet for Healthcare Providers: BankingDealers.co.za This test is not yet approved or cleared by the [place] FDA and has  been authorized for detection and/or diagnosis of SARS-CoV-2 by FDA under an Emergency Use Authorization (EUA).  This EUA will remain in effect (meaning this test can be used) for the duration of the COVID-19 declaration under Section 564(b)(1) of the Act, 21 U.S.C. section 360bbb-3(b)(1), unless the authorization is terminated or revoked sooner. Performed at [facility], [address] [place], [place] [address]   MRSA PCR Screening     Status: None   Collection Time: 10/30/18  5:45 AM   Specimen: Nasopharyngeal  Result Value Ref Range Status   MRSA by PCR NEGATIVE NEGATIVE Final    Comment:        The GeneXpert MRSA Assay (FDA approved for NASAL specimens only), is one component of a comprehensive MRSA colonization surveillance program. It is not intended to diagnose MRSA infection nor to guide or monitor treatment for MRSA infections. Performed at [facility], [address] [place], [place] [address]     Coagulation Studies: Recent Labs    10/31/18 2240  LABPROT 14.2  INR 1.1    Urinalysis: No results for input(s): COLORURINE, LABSPEC, PHURINE, GLUCOSEU, HGBUR, BILIRUBINUR, KETONESUR, PROTEINUR, UROBILINOGEN, NITRITE, LEUKOCYTESUR in  the last 72 hours.  Invalid input(s): APPERANCEUR    Imaging: No results found.   Medications:   . sodium chloride 10 mL/hr at 10/30/18 0253  . sodium chloride    . heparin 1,600 Units/hr (11/03/18 0700)   . amiodarone  400 mg Oral BID  . artificial tears   Both Eyes BID  . aspirin  81 mg Oral Daily  . atorvastatin  80 mg Oral Daily  . calcium acetate  1,334 mg Oral TID WC  . carvedilol  3.125 mg Oral BID WC  . Chlorhexidine Gluconate Cloth  6 each Topical Q0600  . cinacalcet  90 mg Oral Q breakfast  . cycloSPORINE  1 drop Both Eyes BID  . docusate sodium  100 mg Oral Daily  . dorzolamide-timolol  1 drop Both Eyes BID  . feeding supplement (NEPRO CARB STEADY)  237 mL Oral BID BM  . hydroxypropyl methylcellulose / hypromellose  1 drop Both Eyes Q4H while awake  . latanoprost  1 drop Both Eyes QHS  . loratadine  10 mg Oral Daily  . midodrine  10 mg Oral Once  . mirtazapine  15 mg Oral QHS  . multivitamin  1 tablet Oral QHS  . pantoprazole  40 mg Oral Daily  . sertraline  150 mg Oral QHS  . sevelamer carbonate  0.8 g Oral TID WC  . sodium chloride flush  3 mL Intravenous Q12H  . sodium chloride flush  3 mL Intravenous Q12H  . ticagrelor  90 mg Oral BID   sodium chloride, acetaminophen, acetaminophen, Melatonin, methocarbamol, midodrine, morphine injection, nitroGLYCERIN, ondansetron (ZOFRAN) IV, oxyCODONE, senna-docusate, sodium chloride flush  Assessment/ Plan:  63 y.o. male with ESRD, dialysis for almost 30 years, during the patient, recent cervical decompression, autoimmune skin disease, STEMI s/p PCI 2 DES to LAD on 10/30/2018 is admitted for:  Principal Problem:   STEMI (ST elevation myocardial infarction) ([place]) Active Problems:   STEMI involving left anterior descending coronary artery (HCC)   Chest pain of uncertain etiology   Ischemic cardiomyopathy  VA Plain/88 kg/ MWF/ 3.45 min. Left arm AVF  #. ESRD with mild hyperkalemia Patient is critically ill with  recent STEMI s/p PCI 10/26 Iv contrast exposure for CT angio on 10/27 and for repeat heart cath Plan for HD later today   #.  Anemia of CKD  Lab Results  Component Value Date   HGB 11.7 (L) 11/03/2018  Hold Epogen because of recent acute coronary syndrome May be able to resume outpatient  #. SHPTH  No results found for: PTH Lab Results  Component Value Date   PHOS 3.6 11/01/2018  Monitor phosphorus during hospitalization  # STEMI Underwent emergent PCI 10/26.  Severe multivessel coronary disease with culprit lesion involving proximal and mid LAD treated with 2 drug-eluting stents.   Repeat gheart cath on 10/28 showed right heart pressures were severely elevated.another stent was placed    LOS: [place] 10/30/20208:31 AM  [place], [place]

## 2018-11-03 NOTE — Progress Notes (Deleted)
Post HD Assessment:    11/03/18 1231  Neurological  Level of Consciousness Alert  Orientation Level Oriented X4  Respiratory  Respiratory Pattern Regular  Chest Assessment Chest expansion symmetrical  Bilateral Breath Sounds Clear;Diminished  Cough Non-productive  Cardiac  Pulse Regular  Heart Sounds S1, S2  Cardiac Rhythm NSR  Vascular  R Radial Pulse +2  L Radial Pulse +2  Integumentary  Integumentary (WDL) X  GU Assessment  Genitourinary (WDL) X  Genitourinary Symptoms Anuria  Psychosocial  Psychosocial (WDL) WDL  Patient Behaviors Calm;Cooperative

## 2018-11-03 NOTE — Progress Notes (Signed)
ANTICOAGULATION CONSULT NOTE -   Pharmacy Consult for Heparin Indication: chest pain/ACS  Patient Measurements: Height: 5\' 9"  (175.3 cm) Weight: 190 lb 11.2 oz (86.5 kg) IBW/kg (Calculated) : 70.7 HEPARIN DW (KG): 88  Vital Signs: Temp: 98.4 F (36.9 C) (10/30 0200) Temp Source: Oral (10/30 0200)  Labs: Recent Labs    10/31/18 2133 10/31/18 2240 10/31/18 2331  11/01/18 0557 11/01/18 1724 11/02/18 0624 11/02/18 2020 11/03/18 0353  HGB  --   --   --    < > 11.1*  --  11.3*  --  11.7*  HCT  --   --   --   --  32.8*  --  32.7*  --  35.2*  PLT  --   --   --   --  241  --  242  --  263  APTT  --  36  --   --   --   --   --   --   --   LABPROT  --  14.2  --   --   --   --   --   --   --   INR  --  1.1  --   --   --   --   --   --   --   HEPARINUNFRC  --   --   --    < > 0.37  --  0.22* <0.10* <0.10*  CREATININE  --   --   --    < > 7.10* 6.36* 7.78*  --  5.47*  TROPONINIHS 442*  --  423*  --   --   --   --   --   --    < > = values in this interval not displayed.    Estimated Creatinine Clearance: 15.1 mL/min (A) (by C-G formula based on SCr of 5.47 mg/dL (H)).   Medical History: Past Medical History:  Diagnosis Date  . Bullous dermatosis   . Dialysis patient ([place])   . Dysphagia   . FSGS (focal segmental glomerulosclerosis)   . Hyperlipidemia   . Renal insufficiency   . Spinal stenosis, cervical region     Medications:  Medications Prior to Admission  Medication Sig Dispense Refill Last Dose  . amLODipine (NORVASC) 5 MG tablet Take 5 mg by mouth daily.    10/29/2018 at 0900  . atorvastatin (LIPITOR) 40 MG tablet Take 20 mg by mouth daily.    10/29/2018 at 2100  . b complex-vitamin c-folic acid (NEPHRO-VITE) 0.8 MG TABS tablet Take 1 tablet by mouth at bedtime.   10/29/2018 at 2100  . calcium acetate (PHOSLO) 667 MG capsule Take 1,334 mg by mouth 3 (three) times daily with meals.    10/29/2018 at 1800  . carboxymethylcellulose (REFRESH PLUS) 0.5 % SOLN 1 drop  every 2 (two) hours while awake.   prn at prn  . carvedilol (COREG) 25 MG tablet Take 25 mg by mouth 2 (two) times daily with a meal.    10/29/2018 at 1800  . cetirizine (ZYRTEC) 10 MG tablet Take 10 mg by mouth daily.   10/29/2018 at 2100  . cinacalcet (SENSIPAR) 90 MG tablet Take 90 mg by mouth daily.   10/29/2018 at 0900  . cycloSPORINE (RESTASIS) 0.05 % ophthalmic emulsion Place 1 drop into both eyes 2 (two) times daily.   10/29/2018 at 2000  . docusate sodium (COLACE) 100 MG capsule Take 100 mg by mouth daily.   10/29/2018 at 0900  .  dorzolamidel-timolol (COSOPT) 22.3-6.8 MG/ML SOLN ophthalmic solution 1 drop 2 (two) times daily.   10/29/2018 at 2100  . hydrALAZINE (APRESOLINE) 10 MG tablet Take 10 mg by mouth every 4 (four) hours as needed (systolic BP >[id number]).    10/29/2018 at 1500  . hydrOXYzine (VISTARIL) 25 MG capsule Take 25 mg by mouth 3 (three) times daily as needed.   10/29/2018 at 2100  . ketoconazole (NIZORAL) 2 % shampoo Apply 1 application topically 2 (two) times a week.   Past Week at Unknown time  . lactulose (CHRONULAC) 10 GM/15ML solution Take by mouth 3 (three) times daily.   10/29/2018 at 2100  . latanoprost (XALATAN) 0.005 % ophthalmic solution Place 1 drop into both eyes at bedtime.   10/29/2018 at 2100  . losartan (COZAAR) 50 MG tablet Take 50 mg by mouth daily. Take one Tuesday, Thursday, Saturday, Sunday   10/29/2018 at 0900  . mirtazapine (REMERON) 15 MG tablet Take 15 mg by mouth at bedtime.   10/29/2018 at 2100  . omeprazole (PRILOSEC) 20 MG capsule Take 20 mg by mouth 2 (two) times daily before a meal.   10/29/2018 at 1700  . Psyllium (KONSYL-D PO) Take 5 mLs by mouth 2 (two) times daily.   10/29/2018 at 1800  . senna-docusate (SENOKOT-S) 8.6-50 MG tablet Take 1 tablet by mouth 2 (two) times daily as needed (to prevent constipation; scheduled until bowel movement).    10/29/2018 at 2100  . sertraline (ZOLOFT) 50 MG tablet Take 150 mg by mouth at bedtime.    10/29/2018 at 2100  . sevelamer carbonate (RENVELA) 0.8 g PACK packet Take 0.8 g by mouth 3 (three) times daily with meals.   10/29/2018 at 1800  . acetaminophen (TYLENOL) 325 MG tablet Take 650 mg by mouth every 8 (eight) hours as needed (pain or fever).    prn at prn  . betamethasone dipropionate (DIPROLENE) 0.05 % ointment Apply topically 2 (two) times daily.   prn at prn  . bisacodyl (DULCOLAX) 10 MG suppository Place 10 mg rectally daily as needed for moderate constipation.   prn at prn  . Carboxymethylcellul-Glycerin (REFRESH OPTIVE SENSITIVE OP) Place 1 drop into both eyes every 4 (four) hours.   prn at prn  . clobetasol ointment (TEMOVATE) 8.50 % Apply 1 application topically 2 (two) times daily.   prn at prn  . Melatonin 5 MG CAPS Take by mouth.   prn at prn  . methocarbamol (ROBAXIN) 500 MG tablet Take 500 mg by mouth 2 (two) times daily.    Completed Course at Unknown time  . mupirocin cream (BACTROBAN) 2 % Apply 1 application topically 2 (two) times daily. (Patient not taking: Reported on 10/30/2018) 30 g 0 Completed Course at Unknown time  . naloxone (NARCAN) nasal spray 4 mg/0.1 mL Place 1 spray into the nose.   prn at prn  . oxycodone (OXY-IR) 5 MG capsule Take 5-10 mg by mouth every 4 (four) hours as needed for pain.    Completed Course at Unknown time  . polyvinyl alcohol (LIQUIFILM TEARS) 1.4 % ophthalmic solution Place 1 drop into both eyes 4 (four) times daily as needed for dry eyes.   prn at prn  . terbinafine (LAMISIL) 1 % cream Apply 1 application topically 2 (two) times daily.   prn at prn   Assessment: Per NP note - Pt's EKG with ST Elevation in Inferior leads (appears more pronounced than on previous EKG) along with A-fib w/ RVR (HR 110-130).  Pt's high  sensitivity troponin 442 (previously 459). Pharmacy asked to initiate and monitor Heparin drip for ACS & A-fib. Baseline aPTT 36s, INR 1.1  Heparin Course: 10/29 0624 HL 0.22 10/29 2020 HL <0.10(spoke to nurse, stated  line was never stopped and no infusion site complications) 66/29 4765 HL < 0.10, subtherapeutic, confirmed w/ RN no problems w/ infusion.  Goal of Therapy:  Heparin level 0.3-0.7 units/ml Monitor platelets by anticoagulation protocol: Yes   Plan:   Will rebolus with 2500 units x1 then change rate to 1600 units/hr  Recheck heparin level 8 hours after rate increase   H&H trending lower, PLT stable: f/u CBC in am  [place] Crane, Adel Neyer A,  11/03/2018,6:31 AM

## 2018-11-04 DIAGNOSIS — I255 Ischemic cardiomyopathy: Secondary | ICD-10-CM | POA: Diagnosis not present

## 2018-11-04 DIAGNOSIS — I2102 ST elevation (STEMI) myocardial infarction involving left anterior descending coronary artery: Secondary | ICD-10-CM | POA: Diagnosis not present

## 2018-11-04 DIAGNOSIS — Z992 Dependence on renal dialysis: Secondary | ICD-10-CM

## 2018-11-04 DIAGNOSIS — N186 End stage renal disease: Secondary | ICD-10-CM | POA: Diagnosis not present

## 2018-11-04 DIAGNOSIS — I480 Paroxysmal atrial fibrillation: Secondary | ICD-10-CM | POA: Diagnosis not present

## 2018-11-04 LAB — CULTURE, BLOOD (ROUTINE X 2)
Culture: NO GROWTH
Culture: NO GROWTH
Special Requests: ADEQUATE
Special Requests: ADEQUATE

## 2018-11-04 LAB — CBC
HCT: 34 % — ABNORMAL LOW (ref 39.0–52.0)
Hemoglobin: 11.3 g/dL — ABNORMAL LOW (ref 13.0–17.0)
MCH: 34.3 pg — ABNORMAL HIGH (ref 26.0–34.0)
MCHC: 33.2 g/dL (ref 30.0–36.0)
MCV: 103.3 fL — ABNORMAL HIGH (ref 80.0–100.0)
Platelets: 242 10*3/uL (ref 150–400)
RBC: 3.29 MIL/uL — ABNORMAL LOW (ref 4.22–5.81)
RDW: 13.8 % (ref 11.5–15.5)
WBC: 7.8 10*3/uL (ref 4.0–10.5)
nRBC: 0.6 % — ABNORMAL HIGH (ref 0.0–0.2)

## 2018-11-04 LAB — RENAL FUNCTION PANEL
Albumin: 3.1 g/dL — ABNORMAL LOW (ref 3.5–5.0)
Anion gap: 13 (ref 5–15)
BUN: 36 mg/dL — ABNORMAL HIGH (ref 8–23)
CO2: 28 mmol/L (ref 22–32)
Calcium: 7.9 mg/dL — ABNORMAL LOW (ref 8.9–10.3)
Chloride: 94 mmol/L — ABNORMAL LOW (ref 98–111)
Creatinine, Ser: 4.87 mg/dL — ABNORMAL HIGH (ref 0.61–1.24)
GFR calc Af Amer: 14 mL/min — ABNORMAL LOW (ref >60)
GFR calc non Af Amer: 12 mL/min — ABNORMAL LOW (ref >60)
Glucose, Bld: 81 mg/dL (ref 70–99)
Phosphorus: 2.9 mg/dL (ref 2.5–4.6)
Potassium: 4.2 mmol/L (ref 3.5–5.1)
Sodium: 135 mmol/L (ref 135–145)

## 2018-11-04 LAB — MAGNESIUM: Magnesium: 1.5 mg/dL — ABNORMAL LOW (ref 1.7–2.4)

## 2018-11-04 MED ORDER — AMIODARONE IV BOLUS ONLY 150 MG/100ML
150.0000 mg | Freq: Once | INTRAVENOUS | Status: AC
  Administered 2018-11-04: 150 mg via INTRAVENOUS
  Filled 2018-11-04: qty 100

## 2018-11-04 MED ORDER — AMIODARONE HCL IN DEXTROSE 360-4.14 MG/200ML-% IV SOLN
30.0000 mg/h | INTRAVENOUS | Status: DC
  Administered 2018-11-04 – 2018-11-06 (×4): 30 mg/h via INTRAVENOUS
  Filled 2018-11-04 (×4): qty 200

## 2018-11-04 MED ORDER — METOPROLOL TARTRATE 5 MG/5ML IV SOLN
5.0000 mg | Freq: Once | INTRAVENOUS | Status: AC
  Administered 2018-11-04: 5 mg via INTRAVENOUS
  Filled 2018-11-04: qty 5

## 2018-11-04 MED ORDER — MAGNESIUM SULFATE 2 GM/50ML IV SOLN
2.0000 g | Freq: Once | INTRAVENOUS | Status: AC
  Administered 2018-11-04: 2 g via INTRAVENOUS
  Filled 2018-11-04: qty 50

## 2018-11-04 MED ORDER — AMIODARONE HCL IN DEXTROSE 360-4.14 MG/200ML-% IV SOLN
60.0000 mg/h | INTRAVENOUS | Status: AC
  Administered 2018-11-04: 60 mg/h via INTRAVENOUS
  Filled 2018-11-04: qty 200

## 2018-11-04 MED ORDER — LORAZEPAM 1 MG PO TABS
1.0000 mg | ORAL_TABLET | ORAL | Status: DC | PRN
  Administered 2018-11-04 – 2018-11-06 (×5): 1 mg via ORAL
  Filled 2018-11-04 (×5): qty 1

## 2018-11-04 MED ORDER — ARTIFICIAL TEARS OPHTHALMIC OINT
TOPICAL_OINTMENT | Freq: Two times a day (BID) | OPHTHALMIC | Status: DC
  Administered 2018-11-04 – 2018-11-05 (×3): via OPHTHALMIC
  Administered 2018-11-05 – 2018-11-06 (×2): 1 via OPHTHALMIC
  Administered 2018-11-06 – 2018-11-07 (×2): via OPHTHALMIC
  Filled 2018-11-04 (×2): qty 3.5

## 2018-11-04 NOTE — Progress Notes (Signed)
[facility], [place] 11/04/18  Subjective:   LOS: 5   Intake/output 10/30 0701 - 10/31 0700 In: 379.3 [P.O.:240; I.V.:139.3] Out: 1500   Patient known to our practice from previous admissions He presented to the emergency room for complaints of chest pain and fever. Diagnosed with STEMI. Underwent emergent left heart catheterization and Found to have severe multivessel coronary disease with culprit lesion involving proximal and mid LAD which was successfully treated with 2 overlapping drug-eluting stents.  Chest pain significantly improved after the procedure.  Underwent successful HD on Friday. 1500 cc removed Today, he states that he overall feels fair although not quite back to baseline.  He is feeling anxious. Currently on room air.  No shortness of breath.   Objective:  Vital signs in last 24 hours:  Temp:  [97.7 F (36.5 C)-98.9 F (37.2 C)] 98.4 F (36.9 C) (10/31 0716) Pulse Rate:  [61-139] 129 (10/31 1039) Resp:  [18-26] 21 (10/30 1900) BP: (109-151)/(80-100) 121/80 (10/31 0716) SpO2:  [95 %-100 %] 95 % (10/31 0716) Weight:  [87.7 kg-91.1 kg] 87.7 kg (10/31 0716)  Weight change:  Filed Weights   11/03/18 1230 11/03/18 1630 11/04/18 0716  Weight: 91.1 kg 89.9 kg 87.7 kg    Intake/Output:    Intake/Output Summary (Last 24 hours) at 11/04/2018 1122 Last data filed at 11/04/2018 1045 Gross per 24 hour  Intake 94.26 ml  Output 1500 ml  Net -1405.74 ml     Physical Exam: General:  No acute distress, sitting up in the bed  HEENT  anicteric, moist oral mucous membranes  Pulm/lungs  normal breathing effort on room air  CVS/Heart  irregular  Abdomen:   Soft, nontender  Extremities:  Trace edema  Neurologic:  Alert, oriented  Skin:  No acute rashes  Access:  Left forearm AV fistula       Basic Metabolic Panel:  Recent Labs  Lab 10/30/18 1516 10/31/18 0440 11/01/18 0557 11/01/18 1724 11/02/18 0624 11/03/18 0353  11/04/18 0614  NA  --  136 132* 130* 132* 135 135  K  --  4.4 5.6* 5.2* 5.4* 4.2 4.2  CL  --  96* 93* 91* 91* 91* 94*  CO2  --  26 24 22 24 26 28   GLUCOSE  --  89 135* 137* 107* 85 81  BUN  --  42* 63* 61* 77* 52* 36*  CREATININE  --  5.72* 7.10* 6.36* 7.78* 5.47* 4.87*  CALCIUM  --  8.3* 8.1* 7.8* 7.8* 8.0* 7.9*  MG  --   --  1.8 1.8 1.8 1.7 1.5*  PHOS 2.5 2.8 3.6  --   --   --  2.9     CBC: Recent Labs  Lab 10/30/18 0216  10/31/18 0440 11/01/18 0557 11/02/18 0624 11/03/18 0353 11/03/18 1837 11/04/18 0614  WBC 12.7*   < > 11.1* 7.1 6.9 9.7  --  7.8  NEUTROABS 7.8*  --   --   --   --   --   --   --   HGB 13.5   < > 12.1* 11.1* 11.3* 11.7* 12.3* 11.3*  HCT 42.3   < > 36.8* 32.8* 32.7* 35.2* 37.2* 34.0*  MCV 107.4*   < > 104.0* 101.5* 100.6* 104.8*  --  103.3*  PLT 269   < > 245 241 242 263  --  242   < > = values in this interval not displayed.      Lab Results  Component Value  Date   HEPBSAG NON REACTIVE 10/30/2018      Microbiology:  Recent Results (from the past 240 hour(s))  Blood Culture (routine x 2)     Status: None   Collection Time: 10/30/18  2:16 AM   Specimen: BLOOD  Result Value Ref Range Status   Specimen Description BLOOD RIGHT FOREARM  Final   Special Requests   Final    BOTTLES DRAWN AEROBIC AND ANAEROBIC Blood Culture adequate volume   Culture   Final    NO GROWTH 5 DAYS Performed at [facility], [address] [place], [place] [address]    Report Status 11/04/2018 FINAL  Final  Blood Culture (routine x 2)     Status: None   Collection Time: 10/30/18  2:16 AM   Specimen: BLOOD  Result Value Ref Range Status   Specimen Description BLOOD RIGHT ANTECUBITAL  Final   Special Requests   Final    BOTTLES DRAWN AEROBIC AND ANAEROBIC Blood Culture adequate volume   Culture   Final    NO GROWTH 5 DAYS Performed at [facility], [address] [place], [place] [address]    Report Status 11/04/2018 FINAL  Final  SARS  Coronavirus 2 by RT PCR (hospital order, performed in [place] hospital lab) Nasopharyngeal Nasopharyngeal Swab     Status: None   Collection Time: 10/30/18  2:16 AM   Specimen: Nasopharyngeal Swab  Result Value Ref Range Status   SARS Coronavirus 2 NEGATIVE NEGATIVE Final    Comment: (NOTE) If result is NEGATIVE SARS-CoV-2 target nucleic acids are NOT DETECTED. The SARS-CoV-2 RNA is generally detectable in upper and lower  respiratory specimens during the acute phase of infection. The lowest  concentration of SARS-CoV-2 viral copies this assay can detect is 250  copies / mL. A negative result does not preclude SARS-CoV-2 infection  and should not be used as the sole basis for treatment or other  patient management decisions.  A negative result may occur with  improper specimen collection / handling, submission of specimen other  than nasopharyngeal swab, presence of viral mutation(s) within the  areas targeted by this assay, and inadequate number of viral copies  (<250 copies / mL). A negative result must be combined with clinical  observations, patient history, and epidemiological information. If result is POSITIVE SARS-CoV-2 target nucleic acids are DETECTED. The SARS-CoV-2 RNA is generally detectable in upper and lower  respiratory specimens dur ing the acute phase of infection.  Positive  results are indicative of active infection with SARS-CoV-2.  Clinical  correlation with patient history and other diagnostic information is  necessary to determine patient infection status.  Positive results do  not rule out bacterial infection or co-infection with other viruses. If result is PRESUMPTIVE POSTIVE SARS-CoV-2 nucleic acids MAY BE PRESENT.   A presumptive positive result was obtained on the submitted specimen  and confirmed on repeat testing.  While 2019 novel coronavirus  (SARS-CoV-2) nucleic acids may be present in the submitted sample  additional confirmatory testing may be  necessary for epidemiological  and / or clinical management purposes  to differentiate between  SARS-CoV-2 and other Sarbecovirus currently known to infect humans.  If clinically indicated additional testing with an alternate test  methodology [phone]) is advised. The SARS-CoV-2 RNA is generally  detectable in upper and lower respiratory sp ecimens during the acute  phase of infection. The expected result is Negative. Fact Sheet for Patients:  StrictlyIdeas.no Fact Sheet for Healthcare Providers: BankingDealers.co.za This  test is not yet approved or cleared by the [place] and has been authorized for detection and/or diagnosis of SARS-CoV-2 by FDA under an Emergency Use Authorization (EUA).  This EUA will remain in effect (meaning this test can be used) for the duration of the COVID-19 declaration under Section 564(b)(1) of the Act, 21 U.S.C. section 360bbb-3(b)(1), unless the authorization is terminated or revoked sooner. Performed at [facility], [address] [place], [place] [address]   MRSA PCR Screening     Status: None   Collection Time: 10/30/18  5:45 AM   Specimen: Nasopharyngeal  Result Value Ref Range Status   MRSA by PCR NEGATIVE NEGATIVE Final    Comment:        The GeneXpert MRSA Assay (FDA approved for NASAL specimens only), is one component of a comprehensive MRSA colonization surveillance program. It is not intended to diagnose MRSA infection nor to guide or monitor treatment for MRSA infections. Performed at [facility], [address] [place], [place] [address]     Coagulation Studies: No results for input(s): LABPROT, INR in the last 72 hours.  Urinalysis: No results for input(s): COLORURINE, LABSPEC, PHURINE, GLUCOSEU, HGBUR, BILIRUBINUR, KETONESUR, PROTEINUR, UROBILINOGEN, NITRITE, LEUKOCYTESUR in the last 72 hours.  Invalid input(s): APPERANCEUR    Imaging: No  results found.   Medications:   . sodium chloride 10 mL/hr at 10/30/18 0253  . sodium chloride    . amiodarone    . magnesium sulfate bolus IVPB Stopped (11/03/18 1222)  . magnesium sulfate bolus IVPB 2 g (11/04/18 1031)   . amiodarone  400 mg Oral BID  . artificial tears   Both Eyes BID  . aspirin  81 mg Oral Daily  . atorvastatin  80 mg Oral Daily  . calcium acetate  1,334 mg Oral TID WC  . carvedilol  3.125 mg Oral QHS  . Chlorhexidine Gluconate Cloth  6 each Topical Q0600  . cinacalcet  90 mg Oral Q breakfast  . cycloSPORINE  1 drop Both Eyes BID  . docusate sodium  100 mg Oral Daily  . dorzolamide-timolol  1 drop Both Eyes BID  . feeding supplement (NEPRO CARB STEADY)  237 mL Oral BID BM  . latanoprost  1 drop Both Eyes QHS  . loratadine  10 mg Oral Daily  . metoprolol tartrate  5 mg Intravenous Once  . midodrine  10 mg Oral Once  . mirtazapine  15 mg Oral QHS  . multivitamin  1 tablet Oral QHS  . pantoprazole  40 mg Oral Daily  . polyvinyl alcohol  1 drop Both Eyes Q4H while awake  . sertraline  150 mg Oral QHS  . sevelamer carbonate  0.8 g Oral TID WC  . sodium chloride flush  3 mL Intravenous Q12H  . sodium chloride flush  3 mL Intravenous Q12H  . ticagrelor  90 mg Oral BID   sodium chloride, acetaminophen, acetaminophen, LORazepam, Melatonin, methocarbamol, midodrine, morphine injection, nitroGLYCERIN, ondansetron (ZOFRAN) IV, oxyCODONE, senna-docusate, sodium chloride flush  Assessment/ Plan:  63 y.o. male with ESRD, dialysis for almost 30 years, during the patient, recent cervical decompression, autoimmune skin disease, STEMI s/p PCI 2 DES to LAD on 10/30/2018 and 10/24/2018 is admitted for following problems:  Principal Problem:   STEMI (ST elevation myocardial infarction) ([place]) Active Problems:   STEMI involving left anterior descending coronary artery (HCC)   Chest pain of uncertain etiology   Ischemic cardiomyopathy  [facility] [place]/88 kg/ MWF/ 3.45 min. Left  arm AVF  #. ESRD with mild hyperkalemia Patient with recent STEMI s/p PCI, stent placement 10/26, 10/28, 2020 Iv contrast exposure for CT angio on 10/27 and for repeat heart cath Plan for HD on Monday  #. Anemia of CKD  Lab Results  Component Value Date   HGB 11.3 (L) 11/04/2018  Hold Epogen because of recent acute coronary syndrome May be able to resume outpatient  #. SHPTH  No results found for: PTH Lab Results  Component Value Date   PHOS 2.9 11/04/2018  Monitor phosphorus during hospitalization  # STEMI Underwent emergent PCI 10/26.  Severe multivessel coronary disease with culprit lesion involving proximal and mid LAD treated with 2 drug-eluting stents.   Repeat gheart cath on 10/28 showed right heart pressures were severely elevated.another stent was placed    LOS: [place] 10/31/202011:22 AM  [place], [place]

## 2018-11-04 NOTE — Progress Notes (Signed)
Progress Note  Patient Name: William Carpenter Date of Encounter: 11/04/2018  Primary Cardiologist: New to [facility] - consult by End  Subjective   Case discussed with nephrology, carvedilol down to 3.125 mg every p.m. given hypotension with dialysis (decreased from twice daily) -Converted to atrial fibrillation with RVR rate 140 bpm this morning around 9:50 AM Relatively asymptomatic.  When pushed, perhaps a little bit of shortness of breath or fluttering but overall no significant symptoms -He did receive his amiodarone 400 mg pill x1 with no improvement in rate or rhythm -We ordered metoprolol 5 mg IV x1, mild improvement in rate down to 110 bpm but not sustained -We ordered amiodarone bolus 150 mg x 1 with mild improvement in rate at 110-120 but remained in atrial fibrillation even at 2:30 PM -Earlier this morning reported some chest fullness, anxiety Received Ativan  Inpatient Medications    Scheduled Meds:  amiodarone  400 mg Oral BID   artificial tears   Both Eyes BID   aspirin  81 mg Oral Daily   atorvastatin  80 mg Oral Daily   calcium acetate  1,334 mg Oral TID WC   carvedilol  3.125 mg Oral QHS   Chlorhexidine Gluconate Cloth  6 each Topical Q0600   cinacalcet  90 mg Oral Q breakfast   cycloSPORINE  1 drop Both Eyes BID   docusate sodium  100 mg Oral Daily   dorzolamide-timolol  1 drop Both Eyes BID   feeding supplement (NEPRO CARB STEADY)  237 mL Oral BID BM   latanoprost  1 drop Both Eyes QHS   loratadine  10 mg Oral Daily   midodrine  10 mg Oral Once   mirtazapine  15 mg Oral QHS   multivitamin  1 tablet Oral QHS   pantoprazole  40 mg Oral Daily   polyvinyl alcohol  1 drop Both Eyes Q4H while awake   sertraline  150 mg Oral QHS   sevelamer carbonate  0.8 g Oral TID WC   sodium chloride flush  3 mL Intravenous Q12H   sodium chloride flush  3 mL Intravenous Q12H   ticagrelor  90 mg Oral BID   Continuous Infusions:  sodium chloride  10 mL/hr at 10/30/18 0253   sodium chloride     amiodarone     amiodarone     magnesium sulfate bolus IVPB Stopped (11/03/18 1222)   PRN Meds: sodium chloride, acetaminophen, acetaminophen, LORazepam, Melatonin, methocarbamol, midodrine, morphine injection, nitroGLYCERIN, ondansetron (ZOFRAN) IV, oxyCODONE, senna-docusate, sodium chloride flush   Vital Signs    Vitals:   11/04/18 1154 11/04/18 1203 11/04/18 1209 11/04/18 1220  BP: 94/74 91/79 90/74  100/81  Pulse: (!) 112 (!) 112 (!) 101 (!) 108  Resp:    18  Temp:      TempSrc:      SpO2:      Weight:      Height:        Intake/Output Summary (Last 24 hours) at 11/04/2018 1429 Last data filed at 11/04/2018 1147 Gross per 24 hour  Intake 138.22 ml  Output 1500 ml  Net -1361.78 ml   Filed Weights   11/03/18 1230 11/03/18 1630 11/04/18 0716  Weight: 91.1 kg 89.9 kg 87.7 kg    Telemetry    Normal sinus rhythm converting to atrial fibrillation with RVR rate 130 up to 140 bpm 9:54 AM- Personally Reviewed  ECG      Physical Exam   Constitutional:  oriented to person, place,  and time. No distress.  HENT:  Head: Grossly normal Eyes:  no discharge. No scleral icterus.  Neck: No JVD, no carotid bruits  Cardiovascular: Irregularly irregular, rapid, no murmurs appreciated Pulmonary/Chest: Clear to auscultation bilaterally, no wheezes or rails Abdominal: Soft.  no distension.  no tenderness.  Musculoskeletal: Normal range of motion Neurological:  normal muscle tone. Coordination normal. No atrophy Skin: Skin warm and dry Psychiatric: normal affect, pleasant   Labs    Chemistry Recent Labs  Lab 10/30/18 0216  10/31/18 0440 11/01/18 0557  11/02/18 0624 11/03/18 0353 11/04/18 0614  NA 135   < > 136 132*   < > 132* 135 135  K 4.9   < > 4.4 5.6*   < > 5.4* 4.2 4.2  CL 94*   < > 96* 93*   < > 91* 91* 94*  CO2 25   < > 26 24   < > 24 26 28   GLUCOSE 104*   < > 89 135*   < > 107* 85 81  BUN 60*   < > 42* 63*    < > 77* 52* 36*  CREATININE 7.25*   < > 5.72* 7.10*   < > 7.78* 5.47* 4.87*  CALCIUM 8.6*   < > 8.3* 8.1*   < > 7.8* 8.0* 7.9*  PROT 9.3*  --   --   --   --   --   --   --   ALBUMIN 3.8  --  3.2* 3.3*  --   --   --  3.1*  AST 26  --   --   --   --   --   --   --   ALT 18  --   --   --   --   --   --   --   ALKPHOS 212*  --   --   --   --   --   --   --   BILITOT 1.7*  --   --   --   --   --   --   --   GFRNONAA 7*   < > 10* 7*   < > 7* 10* 12*  GFRAA 8*   < > 11* 9*   < > 8* 12* 14*  ANIONGAP 16*   < > 14 15   < > 17* 18* 13   < > = values in this interval not displayed.     Hematology Recent Labs  Lab 11/02/18 0321 11/03/18 0353 11/03/18 1837 11/04/18 0614  WBC 6.9 9.7  --  7.8  RBC 3.25* 3.36*  --  3.29*  HGB 11.3* 11.7* 12.3* 11.3*  HCT 32.7* 35.2* 37.2* 34.0*  MCV 100.6* 104.8*  --  103.3*  MCH 34.8* 34.8*  --  34.3*  MCHC 34.6 33.2  --  33.2  RDW 13.8 14.1  --  13.8  PLT 242 263  --  242    Cardiac EnzymesNo results for input(s): TROPONINI in the last 168 hours. No results for input(s): TROPIPOC in the last 168 hours.   BNPNo results for input(s): BNP, PROBNP in the last 168 hours.   DDimer No results for input(s): DDIMER in the last 168 hours.   Radiology    No results found.  Cardiac Studies   [facility] 10/30/2018: Conclusions: 1. Severe three-vessel coronary artery disease with heavy calcification, including sequential 70% and 99% proximal and mid LAD stenoses (culprit lesions), long segment of  irregular 50% distal LAD stenosis, 50% ostial LCx stenosis, severe disease involving diagonal branches and distal portion of OM2, and focal 90% stenosis in the proximal rPDA. 2. Moderately to severely reduced left ventricular systolic function with mid and apical anterior, apical, and apical inferior hypokinesis/akinesis.  LVEF approximately 35%. 3. Mildly elevated left ventricular filling pressure. 4. Successful PCI to proximal and mid LAD using overlapping Synergy 3.5 x 16  mm and 3.0 x 28 mm drug-eluting stents with less than 10% residual stenosis and TIMI-3 flow.  Recommendations: 1. Continue cangrelor infusion for 2 hours after PCI. 2. Dual antiplatelet therapy with aspirin and ticagrelor for at least 12 months, ideally longer given extensive multivessel CAD. 3. Favor medical therapy of distal LAD, diagonal, OM2, and rPDA disease, though if patient has refractory chest pain, PCI to rPDA could be considered. 4. Aggressive secondary prevention of CAD and evidence-based heart failure therapy for ischemic cardiomyopathy.  Carvedilol and losartan should be restarted carefully, as blood pressure allows. 5. Obtain transthoracic echocardiogram. 6. Remove right femoral artery sheath with manual compression when ACT has fallen below 175 seconds. __________  2D echo 10/30/2018: 1. Left ventricular ejection fraction, by visual estimation, is 50 to 55%. The left ventricle has normal function. Normal left ventricular size. There is no left ventricular hypertrophy.  2. Definity contrast agent was given IV to delineate the left ventricular endocardial borders.  3. Left ventricular diastolic Doppler parameters are consistent with pseudonormalization pattern of LV diastolic filling.  4. There is apical akinesis, mid to apical anteroseptal akinesis.  5. Global right ventricle has normal systolic function.The right ventricular size is normal. Right vetricular wall thickness was not assessed.  6. Left atrial size was mild-moderately dilated.  7. Right atrial size was normal.  8. Mild calcification of the posterior mitral valve leaflet(s).  9. The mitral valve is degenerative. No evidence of mitral valve regurgitation. 10. The tricuspid valve is grossly normal. Tricuspid valve regurgitation was not visualized by color flow Doppler. 11. The aortic valve has an indeterminant number of cusps Aortic valve regurgitation was not visualized by color flow Doppler. Mild to moderate aortic  valve sclerosis/calcification without any evidence of aortic stenosis. 12. The pulmonic valve was not well visualized. Pulmonic valve regurgitation is not visualized by color flow Doppler. 13. The inferior vena cava is normal in size with greater than 50% respiratory variability, suggesting right atrial pressure of 3 mmHg. __________  [facility] 11/01/2018: Conclusions: 1. Three-vessel coronary artery disease with heavy calcification, including long segment of irregular 50% distal LAD stenosis, 50% ostial LCx stenosis, severe disease involving diagonal branches and distal portion of OM2, and focal 90% stenosis in the proximal rPDA.  Appearance is similar to cardiac catheterization two days ago. 2. Patent overlapping proximal and mid LAD stents with <10% stenosis in the proximal segment. 3. Moderately elevated left heart and pulmonary artery pressures. 4. Severely elevated right heart filling pressure. 5. Low normal to mildly reduced cardiac output/index. 6. Successful PCI to ostial/proximal rPDA using Resolute Onyx 2.25 x 15 mm drug-eluting stent (postdilated to 2.6 mm) with 0% residual stenosis and TIMI-3 flow.  Recommendations: 1. Continue dual antiplatelet therapy with aspirin and ticagrelor for at least 12 months.  If atrial fibrillation persists,  I suggest transitioning ticagrelor to clopidogrel, discontinuing aspirin, and starting apixaban 5 mg twice daily. 2. Aggressive secondary prevention. 3. Escalate evidence-based heart failure therapy, as tolerating. 4. Hemodialysis today with as much fluid removal as tolerated. 5. Restart heparin infusion in 8 hours.  Patient Profile     63 y.o. 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, chronic pain syndrome, HTN, and HLD who was admitted with an anterior ST elevation MI s/p PCI/DES x 2 to the proximal and mid LAD with recurrent angina with repeat cath s/p PCI to the rPDA, sustained VT, new onset PAF with RVR  Assessment & Plan     STEMI October 26 with STEMI, PCI to the LADx2 October 28 with PCI to PDA On aspirin beta-blocker statin Brilinta Significant small vessel residual disease -He continues to have chronic stable anginal symptoms Also component of anxiety Medical management at this time  Paroxysmal atrial fibrillation Atrial fibrillation shortly after her October 26 stenting Converted with amiodarone infusion 3 AM --Recurrent atrial fibrillation this morning rates 130 and higher, minimally symptomatic -Heparin was discontinued yesterday for possible melena, concern for GI bleed in the setting of triple therapy -He has not responded to oral amiodarone, IV metoprolol, Also did not respond to amiodarone bolus 150 mg x 1 --Recommend restart amiodarone infusion in effort to convert to normal sinus rhythm  Chronic renal failure on hemodialysis Hemodialysis yesterday and day prior Back on track for hemodialysis Monday Wednesday Friday Goes to the [facility] dialysis unit  Tunnel vision Episode yesterday morning with dizziness, Blood pressure stable at the time, reported mild shortness of breath mild chest discomfort, resolved without intervention -Heparin infusion was held yesterday  . WCT: WCT on 10/28, possibly atrial flutter with aberrancy vs VT  -Electrolyte abnormality at the time  No further episodes since that time Now on amiodarone for atrial fibrillation, paroxysmal  Case discussed with hospitalist service, nurses through the day an effort to get rhythm to convert to normal sinus rhythm Various rate and rhythm medications ordered  Total encounter time more than 35 minutes  Greater than 50% was spent in counseling and coordination of care with the patient  Signed, Esmond Plants, MD, Ph.D [facility]

## 2018-11-04 NOTE — Progress Notes (Addendum)
New orders in by cardiology for sustaining heart rate in the 130s 140s. IV lopressor administered and amiodarione bolus started. BP in the 82E systolic, Dr. Rockey Situ aware and ok to continue infusing amiodarone. Verbalized we can slow down the infusion rate.   Update 1457: Amiodarone infusion started with stable blood pressure for heart rate in afib still sustaining in the 120's- 130's.  (see flowsheet).

## 2018-11-04 NOTE — Consult Note (Signed)
PHARMACY CONSULT NOTE  Pharmacy Consult for Electrolyte Monitoring and Replacement   Recent Labs: Potassium (mmol/L)  Date Value  11/04/2018 4.2  06/16/2013 4.4   Magnesium (mg/dL)  Date Value  11/04/2018 1.5 (L)   Calcium (mg/dL)  Date Value  11/04/2018 7.9 (L)   Calcium, Total (mg/dL)  Date Value  06/16/2013 8.9   Albumin (g/dL)  Date Value  11/04/2018 3.1 (L)  06/16/2013 3.3 (L)   Phosphorus (mg/dL)  Date Value  11/04/2018 2.9   Sodium (mmol/L)  Date Value  11/04/2018 135  06/16/2013 141   Corrected Ca: 8.56 mg/dL  Assessment: 63 y.o. male with a history of ESRD secondary to FSGS, HTN, HLD, and chronic pain, admitted with anterior STEMI  Goals of Therapy:  Magnesium ~ 2. All other electrolytes within normal limits.   Plan:  K 4.2  Mag 1.5  Scr 4.87 (Hemodialysis)  Phos 2.9  MD has ordered 2 grams IV magnesium sulfate x1  Will defer potassium replacement to nephrology-Hemodialysis pt.   Re-check electrolytes in am  Chinita Greenland PharmD Clinical Pharmacist 11/04/2018

## 2018-11-04 NOTE — Progress Notes (Signed)
OT Cancellation Note  Patient Details Name: William Carpenter MRN: [id number] DOB: July 27, 1955   Cancelled Treatment:  Per nursing pt Hgn resolved but he is currently extremely tachycardic. To hold of OT at this time.    Rosalyn Gess OTR/L,CLT 11/04/2018, 12:42 PM

## 2018-11-04 NOTE — Progress Notes (Addendum)
[facility] [place] NAME: Chanel Mckesson    MR#:  [id number]  DATE OF BIRTH:  1955-09-15  SUBJECTIVE:   Patient states he is doing well today.  He denies any chest pain currently.  Per RN, he flipped back into A. fib with RVR later this morning.  Heart rates were in the 130s.  Patient states that he has not noted any GI bleeding.  Apparently he had a maroon-colored stool yesterday evening.  REVIEW OF SYSTEMS:  Review of Systems  Constitutional: Negative for chills and fever.  HENT: Negative for congestion and sore throat.   Eyes: Negative for blurred vision and double vision.  Respiratory: Negative for cough and shortness of breath.   Cardiovascular: Positive for chest pain. Negative for palpitations and leg swelling.  Gastrointestinal: Negative for nausea and vomiting.  Genitourinary: Negative for dysuria and urgency.  Musculoskeletal: Negative for back pain and neck pain.  Neurological: Negative for dizziness and headaches.  Psychiatric/Behavioral: Negative for depression. The patient is not nervous/anxious.    DRUG ALLERGIES:   Allergies  Allergen Reactions  . Albumin (Human) Hives  . Aloe   . Erythromycin Hives  . Iodinated Diagnostic Agents Hives  . Iron Dextran   . Metoprolol Tartrate   . Penicillins Swelling  . Quinine Sulfate [Quinine]   . Rabeprazole   . Sulfa Antibiotics Other (See Comments)  . Tape Other (See Comments)    Removes skin.  . Trimethoprim    VITALS:  Blood pressure 113/87, pulse (!) 119, temperature 97.9 F (36.6 C), temperature source Oral, resp. rate 18, height 5\' 9"  (1.753 m), weight 87.7 kg, SpO2 97 %. PHYSICAL EXAMINATION:  Physical Exam  GENERAL:  Laying in bed, in no acute distress.  HEENT: Head atraumatic, normocephalic. Pupils equal, round, reactive to light and accommodation. No scleral icterus. Extraocular muscles intact. Oropharynx and nasopharynx clear.  NECK:  Supple, no jugular  venous distention. No thyroid enlargement. LUNGS: Lungs are clear to auscultation bilaterally. No wheezes, crackles, rhonchi. No use of accessory muscles of respiration.  CARDIOVASCULAR: RRR, S1, S2 normal. No murmurs, rubs, or gallops.  ABDOMEN: Soft, nontender, nondistended. Bowel sounds present.  EXTREMITIES: No pedal edema, cyanosis, or clubbing.  NEUROLOGIC: CN 2-12 intact, no focal deficits. 5/5 muscle strength throughout all extremities. Sensation intact throughout. Gait not checked.  PSYCHIATRIC: The patient is alert and oriented x 3.  SKIN: No obvious rash, lesion, or ulcer.  LABORATORY PANEL:  Male CBC Recent Labs  Lab 11/04/18 0614  WBC 7.8  HGB 11.3*  HCT 34.0*  PLT 242   ------------------------------------------------------------------------------------------------------------------ Chemistries  Recent Labs  Lab 10/30/18 0216  11/04/18 0614  NA 135   < > 135  K 4.9   < > 4.2  CL 94*   < > 94*  CO2 25   < > 28  GLUCOSE 104*   < > 81  BUN 60*   < > 36*  CREATININE 7.25*   < > 4.87*  CALCIUM 8.6*   < > 7.9*  MG  --    < > 1.5*  AST 26  --   --   ALT 18  --   --   ALKPHOS 212*  --   --   BILITOT 1.7*  --   --    < > = values in this interval not displayed.   RADIOLOGY:  No results found. ASSESSMENT AND PLAN:   Acute STEMI- chest pain is improved today. -  s/p cardiac catheterization on 10/26 with DES to the proximal and mid LAD -repeat cardiac cath 10/28 with stable disease and patent stents; rPDA was stented -CTA chest 10/27 showed CAD, but no dissection or PE -Cardiology following -Continue dual antiplatelet therapy with aspirin and brilinta for at least 12 months -ECHO with EF 50-55% -Continue Coreg and Lipitor -No ACE/ARB due to hypotension  Paroxysmal A-fib with RVR- was in NSR, but flipped back into a-fib with RVR this morning. -Restart amiodarone drip today -Continue low dose coreg -Holding on anticoagulation due to concern for GI bleed  -Cardiac monitoring  ?GI bleed- patient had a maroon-colored stool last night. No drop in hemoglobin. -Holding lovenox for now and start SCDs -Need to continue aspirin and brilinta due to recent STEMI and new stents -Would consider GI consult if patient drops hemoglobin or has more bloody bowel movements  Hypomagnesemia -Replete and recheck  ESRD on HD MWF -Nephrology following  Hyperlipidemia- LDL is 90 this admission -Continue home Lipitor  Hypertension- patient did have some low BPs earlier this admission, likely due to cardiogenic shock. -Continue midodrine -Continue coreg -Holding home hydralazine and losartan  Macrocytic anemia- hemoglobin has been stable -Check anemia panel  All the records are reviewed and case discussed with Care Management/Social Worker. Management plans discussed with the patient, family and they are in agreement.  CODE STATUS: Full Code  TOTAL TIME TAKING CARE OF THIS PATIENT: 40 minutes.   More than 50% of the time was spent in counseling/coordination of care: YES  POSSIBLE D/C IN 1-2 DAYS, DEPENDING ON CLINICAL CONDITION.   Berna Spare  M.D on 11/04/2018 at 2:57 PM  Between 7am to 6pm - Pager [phone]  After 6pm go to [email or web address] - Proofreader  [facility]  Office  [phone]  CC: Primary care physician; Raelyn Mora, MD  Note: This dictation was prepared with Dragon dictation along with smaller phrase technology. Any transcriptional errors that result from this process are unintentional.

## 2018-11-04 NOTE — Progress Notes (Signed)
PT Cancellation Note  Patient Details Name: ROYCE STEGMAN MRN: [id number] DOB: Jan 08, 1955   Cancelled Treatment:    Reason Eval/Treat Not Completed: Medical issues which prohibited therapy.  Order received and chart reviewed.   Per nursing, pt currently tachycardic and not appropriate for therapy.  Will check back later to assess appropriateness.  Roxanne Gates, PT, DPT  Roxanne Gates 11/04/2018, 10:57 AM

## 2018-11-05 DIAGNOSIS — I4891 Unspecified atrial fibrillation: Secondary | ICD-10-CM

## 2018-11-05 DIAGNOSIS — K922 Gastrointestinal hemorrhage, unspecified: Secondary | ICD-10-CM

## 2018-11-05 DIAGNOSIS — F419 Anxiety disorder, unspecified: Secondary | ICD-10-CM

## 2018-11-05 LAB — BASIC METABOLIC PANEL
Anion gap: 14 (ref 5–15)
BUN: 48 mg/dL — ABNORMAL HIGH (ref 8–23)
CO2: 26 mmol/L (ref 22–32)
Calcium: 8 mg/dL — ABNORMAL LOW (ref 8.9–10.3)
Chloride: 92 mmol/L — ABNORMAL LOW (ref 98–111)
Creatinine, Ser: 6.08 mg/dL — ABNORMAL HIGH (ref 0.61–1.24)
GFR calc Af Amer: 10 mL/min — ABNORMAL LOW (ref >60)
GFR calc non Af Amer: 9 mL/min — ABNORMAL LOW (ref >60)
Glucose, Bld: 90 mg/dL (ref 70–99)
Potassium: 4.1 mmol/L (ref 3.5–5.1)
Sodium: 132 mmol/L — ABNORMAL LOW (ref 135–145)

## 2018-11-05 LAB — CBC
HCT: 35.1 % — ABNORMAL LOW (ref 39.0–52.0)
Hemoglobin: 11.6 g/dL — ABNORMAL LOW (ref 13.0–17.0)
MCH: 33.9 pg (ref 26.0–34.0)
MCHC: 33 g/dL (ref 30.0–36.0)
MCV: 102.6 fL — ABNORMAL HIGH (ref 80.0–100.0)
Platelets: 262 10*3/uL (ref 150–400)
RBC: 3.42 MIL/uL — ABNORMAL LOW (ref 4.22–5.81)
RDW: 13.6 % (ref 11.5–15.5)
WBC: 7.5 10*3/uL (ref 4.0–10.5)
nRBC: 0.7 % — ABNORMAL HIGH (ref 0.0–0.2)

## 2018-11-05 LAB — FOLATE: Folate: 43 ng/mL (ref >5.9)

## 2018-11-05 LAB — FERRITIN: Ferritin: 469 ng/mL — ABNORMAL HIGH (ref 24–336)

## 2018-11-05 LAB — IRON AND TIBC
Iron: 32 ug/dL — ABNORMAL LOW (ref 45–182)
Saturation Ratios: 18 % (ref 17.9–39.5)
TIBC: 180 ug/dL — ABNORMAL LOW (ref 250–450)
UIBC: 148 ug/dL

## 2018-11-05 LAB — VITAMIN B12: Vitamin B-12: 1033 pg/mL — ABNORMAL HIGH (ref 180–914)

## 2018-11-05 LAB — MAGNESIUM: Magnesium: 2 mg/dL (ref 1.7–2.4)

## 2018-11-05 NOTE — Progress Notes (Signed)
CCMD notified this RN that pt converted to SB w/ HR in the 40s. Amio gtt turned off and notified prime MD. Nursing staff will continue to monitor for any changes in patient status.  Earleen Reaper, RN

## 2018-11-05 NOTE — Progress Notes (Signed)
PROGRESS NOTE    William Carpenter  URK:[id number] DOB: January 13, 1955 DOA: 10/30/2018 PCP: Raelyn Mora, MD   Brief Narrative:  Patient states he is doing well today.  He denies any chest pain currently.  Per RN, he flipped back into A. fib with RVR later this morning.  Heart rates were in the 130s.  Patient states that he has not noted any GI bleeding.  Apparently he had a maroon-colored stool 10/30PM Since then patient reports none, hemoglobin is actually increased.   Assessment & Plan:   Principal Problem:   STEMI (ST elevation myocardial infarction) ([facility]) Active Problems:   STEMI involving left anterior descending coronary artery (HCC)   Chest pain of uncertain etiology   Ischemic cardiomyopathy   Acute STEMI- chest pain is improved today. As previously noted: -s/p cardiac catheterization on 10/26 with DES to the proximal and mid LAD -repeat cardiac cath 10/28 with stable disease and patent stents; rPDA was stented -CTA chest 10/27 showed CAD, but no dissection or PE -Cardiology following-discussed with them 11/1 -Continue dual antiplatelet therapy with aspirin and brilinta for at least 12 months-hemoglobin remained stable we will not pursue triple therapy given risks of bleeding and poor outcome -ECHO with EF 50-55% -Continue Coreg and Lipitor -No ACE/ARB due to hypotension  Paroxysmal A-fib with RVR- was in NSR, but flipped back into a-fib with RVR this morning. -Restarted amiodarone drip 10/31 -Discussed with cardiology who continue load monitor hypotension closely -Continue low dose coreg  -Cardiac monitoring  ?GI bleed- patient had a maroon-colored stool last night. No drop in hemoglobin. -Holding lovenox for now and start SCDs -Need to continue aspirin and brilinta due to recent STEMI and new stents -Hemoglobin actually increased from 11.3-11.6, no additional stools  Hypomagnesemia -2.0 -Repleted 10/31  ESRD on HD MWF -Nephrology following  -Appreciate their input -Anticipate continuation of dialysis tomorrow possible discharge after dialysis  Hyperlipidemia- LDL is 90 this admission -Continue home Lipitor  Hypertension- patient did have some low BPs earlier this admission, likely due to cardiogenic shock. -Discussed with patient's daughter who reports has been ongoing problem for 8 to 9 months of variable blood pressure-discussed this with cardiology today -Plans to continue as below -Continue midodrine -Continue coreg -Holding home hydralazine and losartan  Macrocytic anemia- hemoglobin has been stable -Check anemia panel -Follow-up B12 and folate  DVT prophylaxis: SCD/Compression stockings  Code Status: Full code    Code Status Orders  (From admission, onward)         Start     Ordered   10/30/18 0510  Full code  Continuous     10/30/18 0509        Code Status History    Date Active Date Inactive Code Status Order ID Comments User Context   11/24/2017 1023 11/25/2017 2049 Partial Code [id number]  Jimmy Footman, NP Inpatient   11/23/2017 0108 11/24/2017 1023 Full Code [id number]  Lance Coon, MD ED   07/12/2016 2250 07/17/2016 1948 Full Code [id number]  Vaughan Basta, MD Inpatient   Advance Care Planning Activity    Advance Directive Documentation     Most Recent Value  Type of Advance Directive  Healthcare Power of Attorney, Living will  Pre-existing out of facility DNR order (yellow form or pink MOST form)  -  "MOST" Form in Place?  -     Family Communication: Discussed with daughter by phone Disposition Plan:   Patient remained inpatient for continued IV amiodarone load, close monitoring of electrolytes, heart rhythm.  Patient not stable for discharge Consults called: None Admission status: Inpatient   Consultants:   Cardiology  Procedures:  Dg Chest Port 1 View  Result Date: 10/30/2018 CLINICAL DATA:  63 year old male with fever. EXAM: PORTABLE CHEST 1 VIEW  COMPARISON:  Chest radiograph dated 11/22/2017. FINDINGS: There is shallow inspiration with left lung base atelectasis or infiltrate. Left peripheral and subpleural streaky densities may represent atelectatic changes or developing infiltrate. There is an increased density in the right hilar region which may represent confluence of prominent hilar vessels and related to underlying pulmonary hypertension. An infiltrate or mass is not excluded. Clinical correlation and follow-up to resolution recommended. There is atherosclerotic calcification of the aortic arch. Increased tissue density surrounding the aortic arch may represent atelectatic changes in the adjacent lung. If there is clinical concern for acute aortic pathology further evaluation with CT with IV contrast is recommended. No large pleural effusion or pneumothorax. Stable cardiomegaly. No acute osseous pathology. Cervical ACDF. IMPRESSION: 1. Left lung base streaky densities may represent atelectasis or infiltrate. 2. Increased density in the right hilar region may represent dilated hilar vessels. An infiltrate or mass is not excluded. Clinical correlation and follow-up to resolution recommended. 3. Crescentic density adjacent to the aortic arch may represent atelectasis in the adjacent lung. If there is clinical concern for acute aortic pathology, further evaluation with CT with IV contrast is recommended. 4. Stable cardiomegaly. Electronically Signed   By: Anner Crete M.D.   On: 10/30/2018 02:44   Ct Angio Chest/abd/pel For Dissection W And/or W/wo  Result Date: 10/31/2018 CLINICAL DATA:  63 year old male with history of acute onset of chest and back pain. Suspected aortic dissection. EXAM: CT ANGIOGRAPHY CHEST, ABDOMEN AND PELVIS TECHNIQUE: Multidetector CT imaging through the chest, abdomen and pelvis was performed using the standard protocol during bolus administration of intravenous contrast. Multiplanar reconstructed images and MIPs were  obtained and reviewed to evaluate the vascular anatomy. CONTRAST:  116mL OMNIPAQUE IOHEXOL 350 MG/ML SOLN COMPARISON:  CT the chest, abdomen and pelvis 06/26/2016. FINDINGS: CTA CHEST FINDINGS Cardiovascular: There is no crescentic high attenuation associated with the wall of the thoracic aorta on precontrast images to suggest acute intramural hematoma. No evidence of thoracic aortic aneurysm or dissection. Ascending thoracic aorta aortic arch and descending thoracic aorta measure 3.6 cm, 3.0 cm and 2.5 cm in diameter respectively. 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 There is no significant pericardial fluid, thickening or pericardial calcification. There is aortic atherosclerosis, as well as atherosclerosis of the great vessels of the mediastinum and the coronary arteries, including calcified atherosclerotic plaque in the left main, left anterior descending, left circumflex and right coronary arteries. Calcifications of the aortic valve. Dilatation of the pulmonic trunk (3.8 cm in diameter). Mediastinum/Nodes: No pathologically enlarged mediastinal or hilar lymph nodes. Esophagus is unremarkable in appearance. No axillary lymphadenopathy. Lungs/Pleura: Small calcified granuloma in the apex of the left upper lobe. 5 mm right upper lobe pulmonary nodule (axial image 78 of series 6), stable compared to the prior study, considered definitively benign. No other suspicious appearing pulmonary nodules or masses are noted. Linear scarring in the left lower lobe, similar to the prior study. Trace left pleural effusion, new compared to the prior study. No acute consolidative airspace disease. Musculoskeletal: Diffuse sclerosis throughout the visualized axial and appendicular skeleton, potentially related to hyperparathyroidism in this patient  with history of end stage renal disease. Review of the MIP images confirms the above findings. CTA ABDOMEN AND PELVIS FINDINGS VASCULAR Aorta: Normal caliber aorta without aneurysm, dissection, vasculitis or significant stenosis. Celiac: Patent without evidence of aneurysm, dissection, vasculitis or significant stenosis. SMA: Patent without evidence of aneurysm, dissection, vasculitis or significant stenosis. Renals: Right renal artery is patent without evidence of aneurysm, dissection, vasculitis, fibromuscular dysplasia or significant stenosis. IMA: Patent without evidence of aneurysm, dissection, vasculitis or significant stenosis. Inflow: Patent without evidence of aneurysm, dissection, vasculitis or significant stenosis. Veins: No obvious venous abnormality within the limitations of this arterial phase study. Review of the MIP images confirms the above findings. NON-VASCULAR Hepatobiliary: No suspicious cystic or solid hepatic lesions are confidently identified on today's arterial phase examination. High attenuation lying dependently in the gallbladder, which presumably represents biliary sludge lying dependently. No surrounding inflammatory changes to suggest an acute cholecystitis at this time. Pancreas: No pancreatic mass. No pancreatic ductal dilatation. No pancreatic or peripancreatic fluid collections or inflammatory changes. Spleen: Status post splenectomy. Adrenals/Urinary Tract: Status post left nephrectomy. Right native kidney is severely atrophic with innumerable small low-attenuation lesions throughout the kidney, presumably cysts related to cystic disease of dialysis. 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 No hydroureteronephrosis. Urinary bladder is completely decompressed. Right adrenal gland is normal in appearance. Left adrenal gland is not confidently identified may be surgically absent. Stomach/Bowel: Normal appearance of the stomach.  No pathologic dilatation of small bowel or colon. Numerous colonic diverticulae are noted, with some subtle surrounding inflammatory changes, which could be indicative of an acute diverticulitis, but are more likely related to mild edema in this patient with history of renal failure. The appendix is not confidently identified and may be surgically absent. Regardless, there are no inflammatory changes noted adjacent to the cecum to suggest the presence of an acute appendicitis at this time. Lymphatic: Multiple prominent borderline enlarged lymph nodes are noted throughout the pelvis measuring up to 9 mm in short axis, nonspecific. No pathologically enlarged lymph nodes identified in the abdomen or pelvis. Reproductive: Prostate gland and seminal vesicles are unremarkable in appearance. Other: Trace volume of ascites.  No pneumoperitoneum. Musculoskeletal: Diffuse sclerosis throughout the visualized axial and appendicular skeleton, favored to be related to hyperparathyroidism in this patient with history of end-stage renal disease. Status post laminectomy from L3-L5. Review of the MIP images confirms the above findings. IMPRESSION: 1. No evidence of aortic dissection or acute aortic syndrome. 2. Trace left pleural effusion. 3. Aortic atherosclerosis, in addition to left main and 3 vessel coronary artery disease. Please note that although the presence of coronary artery calcium documents the presence of coronary artery disease, the severity of this disease and any potential stenosis cannot be assessed on this non-gated CT examination. Assessment for potential risk factor modification, dietary therapy or pharmacologic therapy may be warranted, if clinically indicated. 4. Dilatation of the pulmonic trunk (3.8 cm in diameter), concerning for pulmonary arterial hypertension. 5. There are calcifications of the aortic valve. Echocardiographic correlation for evaluation of potential valvular dysfunction may be warranted if  clinically indicated. 6. Colonic diverticulosis without definitive findings to suggest an acute diverticulitis at this time. There are subtle areas of stranding adjacent to the sigmoid colon which are favored to be related to a background state of mild soft tissue edema in this patient with history of end stage renal disease. 7. Additional incidental findings, as above. Electronically Signed   By: Quillian Quince  Entrikin M.D.   On: 10/31/2018 14:34     Antimicrobials:  none  Subjective: No acute events overnight Was noted to be mildly tachycardic and hypotensive with amiodarone drip stopped intermittently Gust with cardiology today plan to restart amiodarone drip  Objective: Vitals:   11/05/18 0230 11/05/18 0343 11/05/18 0800 11/05/18 1021  BP:  (!) 125/92 107/84   Pulse: 95 63 61 74  Resp:  20 18   Temp:  97.7 F (36.5 C) (!) 97.4 F (36.3 C)   TempSrc:  Oral Oral   SpO2:  99% 97% 94%  Weight:  89.7 kg    Height:        Intake/Output Summary (Last 24 hours) at 11/05/2018 1346 Last data filed at 11/05/2018 1058 Gross per 24 hour  Intake 405.06 ml  Output 0 ml  Net 405.06 ml   Filed Weights   11/03/18 1630 11/04/18 0716 11/05/18 0343  Weight: 89.9 kg 87.7 kg 89.7 kg    Examination:  General exam: Appears calm and comfortable  Respiratory system: Clear to auscultation. Respiratory effort normal. Cardiovascular system:NSR, no r/g Gastrointestinal system: Abdomen is nondistended, soft and nontender. No organomegaly or masses felt. Normal bowel sounds heard. Central nervous system: Alert and oriented. No focal neurological deficits. Extremities: Warm well perfused, no edema Skin: No rashes, lesions or ulcers Psychiatry: Judgement and insight appear normal. Mood & affect appropriate.     Data Reviewed: I have personally reviewed following labs and imaging studies  CBC: Recent Labs  Lab 10/30/18 0216  11/01/18 0557 11/02/18 0624 11/03/18 0353 11/03/18 1837 11/04/18 0614  11/05/18 0527  WBC 12.7*   < > 7.1 6.9 9.7  --  7.8 7.5  NEUTROABS 7.8*  --   --   --   --   --   --   --   HGB 13.5   < > 11.1* 11.3* 11.7* 12.3* 11.3* 11.6*  HCT 42.3   < > 32.8* 32.7* 35.2* 37.2* 34.0* 35.1*  MCV 107.4*   < > 101.5* 100.6* 104.8*  --  103.3* 102.6*  PLT 269   < > 241 242 263  --  242 262   < > = values in this interval not displayed.   Basic Metabolic Panel: Recent Labs  Lab 10/30/18 1516 10/31/18 0440  11/01/18 0557 11/01/18 1724 11/02/18 0624 11/03/18 0353 11/04/18 0614 11/05/18 0527  NA  --  136  --  132* 130* 132* 135 135 132*  K  --  4.4  --  5.6* 5.2* 5.4* 4.2 4.2 4.1  CL  --  96*  --  93* 91* 91* 91* 94* 92*  CO2  --  26  --  24 22 24 26 28 26   GLUCOSE  --  89  --  135* 137* 107* 85 81 90  BUN  --  42*  --  63* 61* 77* 52* 36* 48*  CREATININE  --  5.72*  --  7.10* 6.36* 7.78* 5.47* 4.87* 6.08*  CALCIUM  --  8.3*  --  8.1* 7.8* 7.8* 8.0* 7.9* 8.0*  MG  --   --    < > 1.8 1.8 1.8 1.7 1.5* 2.0  PHOS 2.5 2.8  --  3.6  --   --   --  2.9  --    < > = values in this interval not displayed.   GFR: Estimated Creatinine Clearance: 13.8 mL/min (A) (by C-G formula based on SCr of 6.08 mg/dL (H)). Liver Function Tests: Recent  Labs  Lab 10/30/18 0216 10/31/18 0440 11/01/18 0557 11/04/18 0614  AST 26  --   --   --   ALT 18  --   --   --   ALKPHOS 212*  --   --   --   BILITOT 1.7*  --   --   --   PROT 9.3*  --   --   --   ALBUMIN 3.8 3.2* 3.3* 3.1*   Recent Labs  Lab 10/30/18 0216  LIPASE 53*   No results for input(s): AMMONIA in the last 168 hours. Coagulation Profile: Recent Labs  Lab 10/30/18 0216 10/31/18 0440 10/31/18 2240  INR 1.1 1.2 1.1   Cardiac Enzymes: No results for input(s): CKTOTAL, CKMB, CKMBINDEX, TROPONINI in the last 168 hours. BNP (last 3 results) No results for input(s): PROBNP in the last 8760 hours. HbA1C: No results for input(s): HGBA1C in the last 72 hours. CBG: Recent Labs  Lab 10/30/18 0512  GLUCAP 67*    Lipid Profile: No results for input(s): CHOL, HDL, LDLCALC, TRIG, CHOLHDL, LDLDIRECT in the last 72 hours. Thyroid Function Tests: No results for input(s): TSH, T4TOTAL, FREET4, T3FREE, THYROIDAB in the last 72 hours. Anemia Panel: Recent Labs    11/05/18 0527  VITAMINB12 1,033*  FOLATE 43.0  FERRITIN 469*  TIBC 180*  IRON 32*   Sepsis Labs: Recent Labs  Lab 10/30/18 0216 10/30/18 0603 10/31/18 2133  PROCALCITON 0.90 1.73  --   LATICACIDVEN 1.2  --  1.5    Recent Results (from the past 240 hour(s))  Blood Culture (routine x 2)     Status: None   Collection Time: 10/30/18  2:16 AM   Specimen: BLOOD  Result Value Ref Range Status   Specimen Description BLOOD RIGHT FOREARM  Final   Special Requests   Final    BOTTLES DRAWN AEROBIC AND ANAEROBIC Blood Culture adequate volume   Culture   Final    NO GROWTH 5 DAYS Performed at [facility], [address] [place], [place] [address]    Report Status 11/04/2018 FINAL  Final  Blood Culture (routine x 2)     Status: None   Collection Time: 10/30/18  2:16 AM   Specimen: BLOOD  Result Value Ref Range Status   Specimen Description BLOOD RIGHT ANTECUBITAL  Final   Special Requests   Final    BOTTLES DRAWN AEROBIC AND ANAEROBIC Blood Culture adequate volume   Culture   Final    NO GROWTH 5 DAYS Performed at [facility], [address] [place],  [address]    Report Status 11/04/2018 FINAL  Final  SARS Coronavirus 2 by RT PCR (hospital order, performed in [place] hospital lab) Nasopharyngeal Nasopharyngeal Swab     Status: None   Collection Time: 10/30/18  2:16 AM   Specimen: Nasopharyngeal Swab  Result Value Ref Range Status   SARS Coronavirus 2 NEGATIVE NEGATIVE Final    Comment: (NOTE) If result is NEGATIVE SARS-CoV-2 target nucleic acids are NOT DETECTED. The SARS-CoV-2 RNA is generally detectable in upper and lower  respiratory specimens during the acute phase of infection. The lowest   concentration of SARS-CoV-2 viral copies this assay can detect is 250  copies / mL. A negative result does not preclude SARS-CoV-2 infection  and should not be used as the sole basis for treatment or other  patient management decisions.  A negative result may occur with  improper specimen collection / handling, submission of specimen other  than nasopharyngeal swab, presence of viral mutation(s) within the  areas targeted by this assay, and inadequate number of viral copies  (<250 copies / mL). A negative result must be combined with clinical  observations, patient history, and epidemiological information. If result is POSITIVE SARS-CoV-2 target nucleic acids are DETECTED. The SARS-CoV-2 RNA is generally detectable in upper and lower  respiratory specimens dur ing the acute phase of infection.  Positive  results are indicative of active infection with SARS-CoV-2.  Clinical  correlation with patient history and other diagnostic information is  necessary to determine patient infection status.  Positive results do  not rule out bacterial infection or co-infection with other viruses. If result is PRESUMPTIVE POSTIVE SARS-CoV-2 nucleic acids MAY BE PRESENT.   A presumptive positive result was obtained on the submitted specimen  and confirmed on repeat testing.  While 2019 novel coronavirus  (SARS-CoV-2) nucleic acids may be present in the submitted sample  additional confirmatory testing may be necessary for epidemiological  and / or clinical management purposes  to differentiate between  SARS-CoV-2 and other Sarbecovirus currently known to infect humans.  If clinically indicated additional testing with an alternate test  methodology [id number]) is advised. The SARS-CoV-2 RNA is generally  detectable in upper and lower respiratory sp ecimens during the acute  phase of infection. The expected result is Negative. Fact Sheet for Patients:  StrictlyIdeas.no Fact Sheet  for Healthcare Providers: BankingDealers.co.za This test is not yet approved or cleared by the [place] FDA and has been authorized for detection and/or diagnosis of SARS-CoV-2 by FDA under an Emergency Use Authorization (EUA).  This EUA will remain in effect (meaning this test can be used) for the duration of the COVID-19 declaration under Section 564(b)(1) of the Act, 21 U.S.C. section 360bbb-3(b)(1), unless the authorization is terminated or revoked sooner. Performed at [facility], [address] [place], [place] [address]   MRSA PCR Screening     Status: None   Collection Time: 10/30/18  5:45 AM   Specimen: Nasopharyngeal  Result Value Ref Range Status   MRSA by PCR NEGATIVE NEGATIVE Final    Comment:        The GeneXpert MRSA Assay (FDA approved for NASAL specimens only), is one component of a comprehensive MRSA colonization surveillance program. It is not intended to diagnose MRSA infection nor to guide or monitor treatment for MRSA infections. Performed at [facility], [address] [place], [place] [address]          Radiology Studies: No results found.      Scheduled Meds: . artificial tears   Both Eyes BID  . aspirin  81 mg Oral Daily  . atorvastatin  80 mg Oral Daily  . calcium acetate  1,334 mg Oral TID WC  . carvedilol  3.125 mg Oral QHS  . Chlorhexidine Gluconate Cloth  6 each Topical Q0600  . cinacalcet  90 mg Oral Q breakfast  . cycloSPORINE  1 drop Both Eyes BID  . docusate sodium  100 mg Oral Daily  . dorzolamide-timolol  1 drop Both Eyes BID  . feeding supplement (NEPRO CARB STEADY)  237 mL Oral BID BM  . latanoprost  1 drop Both Eyes QHS  . loratadine  10 mg Oral Daily  . midodrine  10 mg Oral Once  . mirtazapine  15 mg Oral QHS  . multivitamin  1 tablet Oral QHS  . pantoprazole  40 mg Oral Daily  . polyvinyl alcohol  1  drop Both Eyes Q4H while awake  . sertraline  150 mg Oral QHS  .  sevelamer carbonate  0.8 g Oral TID WC  . sodium chloride flush  3 mL Intravenous Q12H  . sodium chloride flush  3 mL Intravenous Q12H  . ticagrelor  90 mg Oral BID   Continuous Infusions: . sodium chloride 10 mL/hr at 10/30/18 0253  . sodium chloride    . amiodarone 30 mg/hr (11/05/18 1019)  . magnesium sulfate bolus IVPB Stopped (11/03/18 1222)     LOS: 6 days    Time spent: 35 min    Nicolette Bang, MD [facility]  If 7PM-7AM, please contact night-coverage  11/05/2018, 1:46 PM

## 2018-11-05 NOTE — Consult Note (Signed)
[facility] for Electrolyte Monitoring and Replacement   Recent Labs: Potassium (mmol/L)  Date Value  11/05/2018 4.1  06/16/2013 4.4   Magnesium (mg/dL)  Date Value  11/05/2018 2.0   Calcium (mg/dL)  Date Value  11/05/2018 8.0 (L)   Calcium, Total (mg/dL)  Date Value  06/16/2013 8.9   Albumin (g/dL)  Date Value  11/04/2018 3.1 (L)  06/16/2013 3.3 (L)   Phosphorus (mg/dL)  Date Value  11/04/2018 2.9   Sodium (mmol/L)  Date Value  11/05/2018 132 (L)  06/16/2013 141   Corrected Ca: 8.56 mg/dL  Assessment: 63 y.o. male with a history of ESRD secondary to FSGS, HTN, HLD, and chronic pain, admitted with anterior STEMI  Goals of Therapy:  Magnesium ~ 2. All other electrolytes within normal limits.   Plan:  K 4.1  Mag 2.0  Scr 6.08 (Hemodialysis pt)    No electrolyte replacement warranted at this time  Will defer potassium replacement to nephrology-Hemodialysis pt.   Re-check electrolytes in am  Chinita Greenland PharmD Clinical Pharmacist 11/05/2018

## 2018-11-05 NOTE — Progress Notes (Signed)
Progress Note  Patient Name: William Carpenter Date of Encounter: 11/05/2018  Primary Cardiologist: New to [facility] - consult by End  Subjective   Converted to normal sinus rhythm overnight 3 AM Was asymptomatic even while in atrial fibrillation, No complaints this morning Continues to have issues with anxiety, very concerned about overall health, cardiac issues Worked with physical therapy, ambulated with a walker.  First time he has been out of bed limited secondary to STEMI recovery and atrial fibrillation with RVR  Inpatient Medications    Scheduled Meds: . artificial tears   Both Eyes BID  . aspirin  81 mg Oral Daily  . atorvastatin  80 mg Oral Daily  . calcium acetate  1,334 mg Oral TID WC  . carvedilol  3.125 mg Oral QHS  . Chlorhexidine Gluconate Cloth  6 each Topical Q0600  . cinacalcet  90 mg Oral Q breakfast  . cycloSPORINE  1 drop Both Eyes BID  . docusate sodium  100 mg Oral Daily  . dorzolamide-timolol  1 drop Both Eyes BID  . feeding supplement (NEPRO CARB STEADY)  237 mL Oral BID BM  . latanoprost  1 drop Both Eyes QHS  . loratadine  10 mg Oral Daily  . midodrine  10 mg Oral Once  . mirtazapine  15 mg Oral QHS  . multivitamin  1 tablet Oral QHS  . pantoprazole  40 mg Oral Daily  . polyvinyl alcohol  1 drop Both Eyes Q4H while awake  . sertraline  150 mg Oral QHS  . sevelamer carbonate  0.8 g Oral TID WC  . sodium chloride flush  3 mL Intravenous Q12H  . sodium chloride flush  3 mL Intravenous Q12H  . ticagrelor  90 mg Oral BID   Continuous Infusions: . sodium chloride 10 mL/hr at 10/30/18 0253  . sodium chloride    . amiodarone 30 mg/hr (11/05/18 1019)  . magnesium sulfate bolus IVPB Stopped (11/03/18 1222)   PRN Meds: sodium chloride, acetaminophen, acetaminophen, LORazepam, Melatonin, methocarbamol, midodrine, morphine injection, nitroGLYCERIN, ondansetron (ZOFRAN) IV, oxyCODONE, senna-docusate, sodium chloride flush   Vital Signs    Vitals:    11/05/18 0230 11/05/18 0343 11/05/18 0800 11/05/18 1021  BP:  (!) 125/92 107/84   Pulse: 95 63 61 74  Resp:  20 18   Temp:  97.7 F (36.5 C) (!) 97.4 F (36.3 C)   TempSrc:  Oral Oral   SpO2:  99% 97% 94%  Weight:  89.7 kg    Height:        Intake/Output Summary (Last 24 hours) at 11/05/2018 1412 Last data filed at 11/05/2018 1058 Gross per 24 hour  Intake 405.06 ml  Output 0 ml  Net 405.06 ml   Filed Weights   11/03/18 1630 11/04/18 0716 11/05/18 0343  Weight: 89.9 kg 87.7 kg 89.7 kg    Telemetry    Atrial fibrillation converting to normal sinus rhythm at 3 AM personally Reviewed  ECG      Physical Exam   Constitutional:  oriented to person, place, and time. No distress.  HENT:  Head: Grossly normal Eyes:  no discharge. No scleral icterus.  Neck: No JVD, no carotid bruits  Cardiovascular: Regular rate and rhythm, no murmurs appreciated Pulmonary/Chest: Clear to auscultation bilaterally, no wheezes or rails Abdominal: Soft.  no distension.  no tenderness.  Musculoskeletal: Normal range of motion Neurological:  normal muscle tone. Coordination normal. No atrophy Skin: Skin warm and dry Psychiatric: normal affect, pleasant  Labs    Chemistry Recent Labs  Lab 10/30/18 0216  10/31/18 0440 11/01/18 0557  11/03/18 0353 11/04/18 0614 11/05/18 0527  NA 135   < > 136 132*   < > 135 135 132*  K 4.9   < > 4.4 5.6*   < > 4.2 4.2 4.1  CL 94*   < > 96* 93*   < > 91* 94* 92*  CO2 25   < > 26 24   < > 26 28 26   GLUCOSE 104*   < > 89 135*   < > 85 81 90  BUN 60*   < > 42* 63*   < > 52* 36* 48*  CREATININE 7.25*   < > 5.72* 7.10*   < > 5.47* 4.87* 6.08*  CALCIUM 8.6*   < > 8.3* 8.1*   < > 8.0* 7.9* 8.0*  PROT 9.3*  --   --   --   --   --   --   --   ALBUMIN 3.8  --  3.2* 3.3*  --   --  3.1*  --   AST 26  --   --   --   --   --   --   --   ALT 18  --   --   --   --   --   --   --   ALKPHOS 212*  --   --   --   --   --   --   --   BILITOT 1.7*  --   --   --   --    --   --   --   GFRNONAA 7*   < > 10* 7*   < > 10* 12* 9*  GFRAA 8*   < > 11* 9*   < > 12* 14* 10*  ANIONGAP 16*   < > 14 15   < > 18* 13 14   < > = values in this interval not displayed.     Hematology Recent Labs  Lab 11/03/18 0353 11/03/18 1837 11/04/18 0614 11/05/18 0527  WBC 9.7  --  7.8 7.5  RBC 3.36*  --  3.29* 3.42*  HGB 11.7* 12.3* 11.3* 11.6*  HCT 35.2* 37.2* 34.0* 35.1*  MCV 104.8*  --  103.3* 102.6*  MCH 34.8*  --  34.3* 33.9  MCHC 33.2  --  33.2 33.0  RDW 14.1  --  13.8 13.6  PLT 263  --  242 262    Cardiac EnzymesNo results for input(s): TROPONINI in the last 168 hours. No results for input(s): TROPIPOC in the last 168 hours.   BNPNo results for input(s): BNP, PROBNP in the last 168 hours.   DDimer No results for input(s): DDIMER in the last 168 hours.   Radiology    No results found.  Cardiac Studies   [facility] 10/30/2018: Conclusions: 1. Severe three-vessel coronary artery disease with heavy calcification, including sequential 70% and 99% proximal and mid LAD stenoses (culprit lesions), long segment of irregular 50% distal LAD stenosis, 50% ostial LCx stenosis, severe disease involving diagonal branches and distal portion of OM2, and focal 90% stenosis in the proximal rPDA. 2. Moderately to severely reduced left ventricular systolic function with mid and apical anterior, apical, and apical inferior hypokinesis/akinesis.  LVEF approximately 35%. 3. Mildly elevated left ventricular filling pressure. 4. Successful PCI to proximal and mid LAD using overlapping Synergy 3.5 x 16 mm and 3.0 x  28 mm drug-eluting stents with less than 10% residual stenosis and TIMI-3 flow.  Recommendations: 1. Continue cangrelor infusion for 2 hours after PCI. 2. Dual antiplatelet therapy with aspirin and ticagrelor for at least 12 months, ideally longer given extensive multivessel CAD. 3. Favor medical therapy of distal LAD, diagonal, OM2, and rPDA disease, though if patient has  refractory chest pain, PCI to rPDA could be considered. 4. Aggressive secondary prevention of CAD and evidence-based heart failure therapy for ischemic cardiomyopathy.  Carvedilol and losartan should be restarted carefully, as blood pressure allows. 5. Obtain transthoracic echocardiogram. 6. Remove right femoral artery sheath with manual compression when ACT has fallen below 175 seconds. __________  2D echo 10/30/2018: 1. Left ventricular ejection fraction, by visual estimation, is 50 to 55%. The left ventricle has normal function. Normal left ventricular size. There is no left ventricular hypertrophy.  2. Definity contrast agent was given IV to delineate the left ventricular endocardial borders.  3. Left ventricular diastolic Doppler parameters are consistent with pseudonormalization pattern of LV diastolic filling.  4. There is apical akinesis, mid to apical anteroseptal akinesis.  5. Global right ventricle has normal systolic function.The right ventricular size is normal. Right vetricular wall thickness was not assessed.  6. Left atrial size was mild-moderately dilated.  7. Right atrial size was normal.  8. Mild calcification of the posterior mitral valve leaflet(s).  9. The mitral valve is degenerative. No evidence of mitral valve regurgitation. 10. The tricuspid valve is grossly normal. Tricuspid valve regurgitation was not visualized by color flow Doppler. 11. The aortic valve has an indeterminant number of cusps Aortic valve regurgitation was not visualized by color flow Doppler. Mild to moderate aortic valve sclerosis/calcification without any evidence of aortic stenosis. 12. The pulmonic valve was not well visualized. Pulmonic valve regurgitation is not visualized by color flow Doppler. 13. The inferior vena cava is normal in size with greater than 50% respiratory variability, suggesting right atrial pressure of 3 mmHg. __________  [facility] 11/01/2018: Conclusions: 1. Three-vessel  coronary artery disease with heavy calcification, including long segment of irregular 50% distal LAD stenosis, 50% ostial LCx stenosis, severe disease involving diagonal branches and distal portion of OM2, and focal 90% stenosis in the proximal rPDA.  Appearance is similar to cardiac catheterization two days ago. 2. Patent overlapping proximal and mid LAD stents with <10% stenosis in the proximal segment. 3. Moderately elevated left heart and pulmonary artery pressures. 4. Severely elevated right heart filling pressure. 5. Low normal to mildly reduced cardiac output/index. 6. Successful PCI to ostial/proximal rPDA using Resolute Onyx 2.25 x 15 mm drug-eluting stent (postdilated to 2.6 mm) with 0% residual stenosis and TIMI-3 flow.  Recommendations: 1. Continue dual antiplatelet therapy with aspirin and ticagrelor for at least 12 months.  If atrial fibrillation persists,  I suggest transitioning ticagrelor to clopidogrel, discontinuing aspirin, and starting apixaban 5 mg twice daily. 2. Aggressive secondary prevention. 3. Escalate evidence-based heart failure therapy, as tolerating. 4. Hemodialysis today with as much fluid removal as tolerated. 5. Restart heparin infusion in 8 hours.   Patient Profile     63 y.o. 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, chronic pain syndrome, HTN, and HLD who was admitted with an anterior ST elevation MI s/p PCI/DES x 2 to the proximal and mid LAD with recurrent angina with repeat cath s/p PCI to the rPDA, sustained VT, new onset PAF with RVR  Assessment & Plan    STEMI October 26 with STEMI, PCI  to the LADx2 October 28 with PCI to PDA On aspirin beta-blocker statin Brilinta Significant small vessel residual disease Despite intervention has continued to have episodes of chest tightness more consistent with anxiety -Has no anginal symptoms even when in atrial fibrillation with rate going 130  Paroxysmal atrial fibrillation Atrial fibrillation  shortly after stenting October 26  Converted with amiodarone infusion 3 AM --Recurrent atrial fibrillation yesterday morning rates 130 and higher, minimally symptomatic -Did not responded to oral amiodarone, IV metoprolol, amiodarone bolus, restarted on amiodarone infusion and converted 3 AM -Plan is to continue amiodarone load today with IV, if maintaining normal sinus rhythm could transition to amiodarone 400 twice daily tomorrow for an additional 5-day load -Heparin IV infusion has been held.  Nurses had reported dark looking stools several nights ago.  And given tunnel vision dizziness episode 2 days ago, infusion held  Chronic renal failure on hemodialysis Hemodialysis last Thursday Friday, Back on track for hemodialysis Monday Wednesday Friday Typically goes to the [place] dialysis unit  Tunnel vision Friday, October 30 in the morning morning with dizziness, tunnel vision, was laying supine in bed, blood pressure stable, had shortness of breath, chest discomfort -Unable to exclude anxiety  . WCT: WCT on 10/28, possibly atrial flutter with aberrancy vs VT  -Electrolyte abnormality at the time  No further episodes since that time Continues on amiodarone for atrial fibrillation, paroxysmal  Case discussed with hospitalist service Also discussed with physical therapy and nursing Unstable arrhythmia, attempting to maintain normal sinus rhythm  Total encounter time more than 35 minutes  Greater than 50% was spent in counseling and coordination of care with the patient  Signed, Esmond Plants, MD, Ph.D [facility]

## 2018-11-05 NOTE — TOC Initial Note (Signed)
Transition of Care [facility]) - Initial/Assessment Note    Patient Details  Name: William Carpenter MRN: [id number] Date of Birth: February 14, 1955  [facility]) CM/SW Contact:    Latanya Maudlin, RN Phone Number: 11/05/2018, 10:47 AM  Clinical Narrative:   TOC consulted to complete high risk readmission assessment. Patient lives at home with his spouse and daughter. Patient uses a rollator. Family provides transport. PT is recommending home health, patient agreeable. CMS Medicare.gov Compare Post Acute Care list reviewed with patient and he has used [facility] in the past and prefers to use them again. Referral placed with Chery. No DME needs.               Expected Discharge Plan: [place] Barriers to Discharge: No Barriers Identified   Patient Goals and CMS Choice     Choice offered to / list presented to : Patient  Expected Discharge Plan and Services Expected Discharge Plan: [place] Choice: [place] arrangements for the past 2 months: Single Family Home                           HH Arranged: RN, PT [facility] Agency: [place] Date [place]: 11/05/18 Time HH Agency Contacted: 9 Representative spoke with at [place]: [place] Arrangements/Services Living arrangements for the past 2 months: [place] with:: Adult Children, Spouse Patient language and need for interpreter reviewed:: Yes Do you feel safe going back to the place where you live?: Yes      Need for Family Participation in Patient Care: No (Comment)   Current home services: DME Criminal Activity/Legal Involvement Pertinent to Current Situation/Hospitalization: No - Comment as needed  Activities of Daily Living Home Assistive Devices/Equipment: None ADL Screening (condition at time of admission) Patient's cognitive ability adequate to safely complete daily activities?: Yes Is the  patient deaf or have difficulty hearing?: No Does the patient have difficulty seeing, even when wearing glasses/contacts?: Yes Does the patient have difficulty concentrating, remembering, or making decisions?: Yes Patient able to express need for assistance with ADLs?: Yes Does the patient have difficulty dressing or bathing?: Yes Independently performs ADLs?: Yes (appropriate for developmental age) Does the patient have difficulty walking or climbing stairs?: Yes Weakness of Legs: Both Weakness of Arms/Hands: Both  Permission Sought/Granted                  Emotional Assessment              Admission diagnosis:  STEMI involving left anterior descending coronary artery ([place]) [I21.02] Patient Active Problem List   Diagnosis Date Noted  . Chest pain of uncertain etiology   . Ischemic cardiomyopathy   . STEMI (ST elevation myocardial infarction) ([place]) 10/30/2018  . STEMI involving left anterior descending coronary artery ([place]) 10/30/2018  . Pressure injury of skin 11/23/2017  . ESRD on dialysis ([place]) 11/22/2017  . HLD (hyperlipidemia) 11/22/2017  . Intramuscular hematoma 11/22/2017  . Acalculous cholecystitis   . Leukocytosis 07/12/2016  . Hypotension 07/12/2016   PCP:  Raelyn Mora, MD Pharmacy:   [facility], [address] [place] [address] Phone: [phone] Fax: [place], [place] [address] Phone:  [phone] Fax: [phone]     Social Determinants of Health (SDOH) Interventions    Readmission Risk Interventions No flowsheet data found.

## 2018-11-05 NOTE — Progress Notes (Signed)
[facility], [place] 11/05/18  Subjective:   LOS: 6   Intake/output 10/31 0701 - 11/01 0700 In: 902 [I.V.:408.1; IV Piggyback:44] Out: 0   Patient known to our practice from previous admissions He presented to the emergency room for complaints of chest pain and fever. Diagnosed with STEMI. Underwent emergent left heart catheterization and Found to have severe multivessel coronary disease with culprit lesion involving proximal and mid LAD which was successfully treated with 2 overlapping drug-eluting stents.  Chest pain significantly improved after the procedure.  Underwent successful HD on Friday. 1500 cc removed Doing well today.  Denies any shortness of breath or chest pressure.   Objective:  Vital signs in last 24 hours:  Temp:  [97.4 F (36.3 C)-98.2 F (36.8 C)] 97.4 F (36.3 C) (11/01 0800) Pulse Rate:  [61-127] 74 (11/01 1021) Resp:  [18-20] 18 (11/01 0800) BP: (88-125)/(66-99) 107/84 (11/01 0800) SpO2:  [94 %-100 %] 94 % (11/01 1021) Weight:  [89.7 kg] 89.7 kg (11/01 0343)  Weight change: -3.374 kg Filed Weights   11/03/18 1630 11/04/18 0716 11/05/18 0343  Weight: 89.9 kg 87.7 kg 89.7 kg    Intake/Output:    Intake/Output Summary (Last 24 hours) at 11/05/2018 1059 Last data filed at 11/05/2018 1058 Gross per 24 hour  Intake 449.02 ml  Output 0 ml  Net 449.02 ml     Physical Exam: General:  No acute distress, sitting up in the chair at bedside  HEENT  anicteric, moist oral mucous membranes  Pulm/lungs  normal breathing effort on room air  CVS/Heart  irregular  Abdomen:   Soft, nontender  Extremities:  Trace edema  Neurologic:  Alert, oriented  Skin:  No acute rashes  Access:  Left forearm AV fistula       Basic Metabolic Panel:  Recent Labs  Lab 10/30/18 1516 10/31/18 0440  11/01/18 0557 11/01/18 1724 11/02/18 0624 11/03/18 0353 11/04/18 0614 11/05/18 0527  NA  --  136  --  132* 130* 132* 135 135 132*  K  --   4.4  --  5.6* 5.2* 5.4* 4.2 4.2 4.1  CL  --  96*  --  93* 91* 91* 91* 94* 92*  CO2  --  26  --  24 22 24 26 28 26   GLUCOSE  --  89  --  135* 137* 107* 85 81 90  BUN  --  42*  --  63* 61* 77* 52* 36* 48*  CREATININE  --  5.72*  --  7.10* 6.36* 7.78* 5.47* 4.87* 6.08*  CALCIUM  --  8.3*  --  8.1* 7.8* 7.8* 8.0* 7.9* 8.0*  MG  --   --    < > 1.8 1.8 1.8 1.7 1.5* 2.0  PHOS 2.5 2.8  --  3.6  --   --   --  2.9  --    < > = values in this interval not displayed.     CBC: Recent Labs  Lab 10/30/18 0216  11/01/18 0557 11/02/18 0624 11/03/18 0353 11/03/18 1837 11/04/18 0614 11/05/18 0527  WBC 12.7*   < > 7.1 6.9 9.7  --  7.8 7.5  NEUTROABS 7.8*  --   --   --   --   --   --   --   HGB 13.5   < > 11.1* 11.3* 11.7* 12.3* 11.3* 11.6*  HCT 42.3   < > 32.8* 32.7* 35.2* 37.2* 34.0* 35.1*  MCV 107.4*   < >  101.5* 100.6* 104.8*  --  103.3* 102.6*  PLT 269   < > 241 242 263  --  242 262   < > = values in this interval not displayed.      Lab Results  Component Value Date   HEPBSAG NON REACTIVE 10/30/2018      Microbiology:  Recent Results (from the past 240 hour(s))  Blood Culture (routine x 2)     Status: None   Collection Time: 10/30/18  2:16 AM   Specimen: BLOOD  Result Value Ref Range Status   Specimen Description BLOOD RIGHT FOREARM  Final   Special Requests   Final    BOTTLES DRAWN AEROBIC AND ANAEROBIC Blood Culture adequate volume   Culture   Final    NO GROWTH 5 DAYS Performed at [facility], [address], [place], [place] [address]    Report Status 11/04/2018 FINAL  Final  Blood Culture (routine x 2)     Status: None   Collection Time: 10/30/18  2:16 AM   Specimen: BLOOD  Result Value Ref Range Status   Specimen Description BLOOD RIGHT ANTECUBITAL  Final   Special Requests   Final    BOTTLES DRAWN AEROBIC AND ANAEROBIC Blood Culture adequate volume   Culture   Final    NO GROWTH 5 DAYS Performed at [facility], [address]  [place], [place] [address]    Report Status 11/04/2018 FINAL  Final  SARS Coronavirus 2 by RT PCR (hospital order, performed in [facility] hospital lab) Nasopharyngeal Nasopharyngeal Swab     Status: None   Collection Time: 10/30/18  2:16 AM   Specimen: Nasopharyngeal Swab  Result Value Ref Range Status   SARS Coronavirus 2 NEGATIVE NEGATIVE Final    Comment: (NOTE) If result is NEGATIVE SARS-CoV-2 target nucleic acids are NOT DETECTED. The SARS-CoV-2 RNA is generally detectable in upper and lower  respiratory specimens during the acute phase of infection. The lowest  concentration of SARS-CoV-2 viral copies this assay can detect is 250  copies / mL. A negative result does not preclude SARS-CoV-2 infection  and should not be used as the sole basis for treatment or other  patient management decisions.  A negative result may occur with  improper specimen collection / handling, submission of specimen other  than nasopharyngeal swab, presence of viral mutation(s) within the  areas targeted by this assay, and inadequate number of viral copies  (<250 copies / mL). A negative result must be combined with clinical  observations, patient history, and epidemiological information. If result is POSITIVE SARS-CoV-2 target nucleic acids are DETECTED. The SARS-CoV-2 RNA is generally detectable in upper and lower  respiratory specimens dur ing the acute phase of infection.  Positive  results are indicative of active infection with SARS-CoV-2.  Clinical  correlation with patient history and other diagnostic information is  necessary to determine patient infection status.  Positive results do  not rule out bacterial infection or co-infection with other viruses. If result is PRESUMPTIVE POSTIVE SARS-CoV-2 nucleic acids MAY BE PRESENT.   A presumptive positive result was obtained on the submitted specimen  and confirmed on repeat testing.  While 2019 novel coronavirus  (SARS-CoV-2) nucleic acids may be  present in the submitted sample  additional confirmatory testing may be necessary for epidemiological  and / or clinical management purposes  to differentiate between  SARS-CoV-2 and other Sarbecovirus currently known to infect humans.  If clinically indicated additional testing with an alternate test  methodology [phone]) is advised. The SARS-CoV-2 RNA is generally  detectable in upper and lower respiratory sp ecimens during the acute  phase of infection. The expected result is Negative. Fact Sheet for Patients:  StrictlyIdeas.no Fact Sheet for Healthcare Providers: BankingDealers.co.za This test is not yet approved or cleared by the [place] FDA and has been authorized for detection and/or diagnosis of SARS-CoV-2 by FDA under an Emergency Use Authorization (EUA).  This EUA will remain in effect (meaning this test can be used) for the duration of the COVID-19 declaration under Section 564(b)(1) of the Act, 21 U.S.C. section 360bbb-3(b)(1), unless the authorization is terminated or revoked sooner. Performed at [facility], [address] [place], [place] [address]   MRSA PCR Screening     Status: None   Collection Time: 10/30/18  5:45 AM   Specimen: Nasopharyngeal  Result Value Ref Range Status   MRSA by PCR NEGATIVE NEGATIVE Final    Comment:        The GeneXpert MRSA Assay (FDA approved for NASAL specimens only), is one component of a comprehensive MRSA colonization surveillance program. It is not intended to diagnose MRSA infection nor to guide or monitor treatment for MRSA infections. Performed at [facility], [address] [place], [place] [address]     Coagulation Studies: No results for input(s): LABPROT, INR in the last 72 hours.  Urinalysis: No results for input(s): COLORURINE, LABSPEC, PHURINE, GLUCOSEU, HGBUR, BILIRUBINUR, KETONESUR, PROTEINUR, UROBILINOGEN, NITRITE, LEUKOCYTESUR in  the last 72 hours.  Invalid input(s): APPERANCEUR    Imaging: No results found.   Medications:   . sodium chloride 10 mL/hr at 10/30/18 0253  . sodium chloride    . amiodarone 30 mg/hr (11/05/18 1019)  . magnesium sulfate bolus IVPB Stopped (11/03/18 1222)   . artificial tears   Both Eyes BID  . aspirin  81 mg Oral Daily  . atorvastatin  80 mg Oral Daily  . calcium acetate  1,334 mg Oral TID WC  . carvedilol  3.125 mg Oral QHS  . Chlorhexidine Gluconate Cloth  6 each Topical Q0600  . cinacalcet  90 mg Oral Q breakfast  . cycloSPORINE  1 drop Both Eyes BID  . docusate sodium  100 mg Oral Daily  . dorzolamide-timolol  1 drop Both Eyes BID  . feeding supplement (NEPRO CARB STEADY)  237 mL Oral BID BM  . latanoprost  1 drop Both Eyes QHS  . loratadine  10 mg Oral Daily  . midodrine  10 mg Oral Once  . mirtazapine  15 mg Oral QHS  . multivitamin  1 tablet Oral QHS  . pantoprazole  40 mg Oral Daily  . polyvinyl alcohol  1 drop Both Eyes Q4H while awake  . sertraline  150 mg Oral QHS  . sevelamer carbonate  0.8 g Oral TID WC  . sodium chloride flush  3 mL Intravenous Q12H  . sodium chloride flush  3 mL Intravenous Q12H  . ticagrelor  90 mg Oral BID   sodium chloride, acetaminophen, acetaminophen, LORazepam, Melatonin, methocarbamol, midodrine, morphine injection, nitroGLYCERIN, ondansetron (ZOFRAN) IV, oxyCODONE, senna-docusate, sodium chloride flush  Assessment/ Plan:  63 y.o. male with ESRD, dialysis for almost 30 years, during the patient, recent cervical decompression, autoimmune skin disease, STEMI s/p PCI 2 DES to LAD on 10/30/2018 and 10/24/2018 is admitted for following problems:  Principal Problem:   STEMI (ST elevation myocardial infarction) [facility]) Active Problems:   STEMI involving left anterior descending coronary artery ([place])  Chest pain of uncertain etiology   Ischemic cardiomyopathy  VA /88 kg/ MWF/ 3.45 min. Left arm AVF  #. ESRD with mild  hyperkalemia Patient with recent STEMI s/p PCI, stent placement 10/26, 10/28, 2020 Iv contrast exposure for CT angio on 10/27 and for repeat heart cath Plan for HD on Monday  #. Anemia of CKD  Lab Results  Component Value Date   HGB 11.6 (L) 11/05/2018  Hold Epogen because of recent acute coronary syndrome May be able to resume as outpatient  #. SHPTH  No results found for: PTH Lab Results  Component Value Date   PHOS 2.9 11/04/2018  Monitor phosphorus during hospitalization  # STEMI Underwent emergent PCI 10/26.  Severe multivessel coronary disease with culprit lesion involving proximal and mid LAD treated with 2 drug-eluting stents.   Repeat gheart cath on 10/28 showed right heart pressures were severely elevated.another stent was placed    LOS: [place] 11/1/202010:59 [place], [place]

## 2018-11-05 NOTE — Evaluation (Signed)
Physical Therapy Evaluation Patient Details Name: William Carpenter MRN: [id number] DOB: [birth year] Today's Date: 11/05/2018   History of Present Illness  Pt is a 63 year old male admitted with c/o chest pain, SOB and cough. Pt with STEMI, sepsis, ischemic cardiomyopathy, ESRD.  PMH includes spinal stenosis and dysphagia.  Clinical Impression  Pt is a 63 year old male who lives in a one story home with his children.  He uses a rollator for household ambulation and a WC for community ambulation.  Pt sitting up in bed and appearing solemn when PT arrived.  He was not talkative but answered questions appropriately.  Pt able to perform bed mobility mod I, has a hospital bed at home.  He stood with supervision and required min VC's for use of RW throughout ambulation, 90 ft.  Pt limited in walking distance by LBP and became fatigued an mildly SOB after ~60 ft.  He presented with fair/good strength of UE's with exception of limited R shoulder ROM, and good LE strength.  Pt open to all education and responds quickly to cues.  He will benefit from continued PT with focus on strength, pain management and tolerance to activity.    Follow Up Recommendations Home health PT;Supervision - Intermittent    Equipment Recommendations  None recommended by PT    Recommendations for Other Services       Precautions / Restrictions Restrictions Weight Bearing Restrictions: No      Mobility  Bed Mobility                  Transfers Overall transfer level: Needs assistance Equipment used: Rolling walker (2 wheeled) Transfers: Sit to/from Stand Sit to Stand: Supervision         General transfer comment: Able to rise without assistance, controlled descent to chair without UE's.  Ambulation/Gait Ambulation/Gait assistance: Min guard Gait Distance (Feet): 90 Feet Assistive device: Rolling walker (2 wheeled)     Gait velocity interpretation: 1.31 - 2.62 ft/sec, indicative of limited community  ambulator General Gait Details: Good step length, height, flexed posture and pushes RW anteriorly.  Responds well to education.  Reported increased LBP after 60 ft of ambulation and was eager to return to bed to lie down.  Agreeable to sit up in chair to see if it helps back pain.  Pt mentioned L foot was uncomfortable due to "skin issue" when initiating ambulation but refused shoes and didn't seem to be limited by this once he started.  Stairs            Wheelchair Mobility    Modified Rankin (Stroke Patients Only)       Balance Overall balance assessment: Modified Independent                                           Pertinent Vitals/Pain Pain Assessment: Faces Faces Pain Scale: Hurts little more Pain Location: LBP following 60 ft of ambulation Pain Descriptors / Indicators: Aching;Grimacing Pain Intervention(s): Limited activity within patient's tolerance;Monitored during session    Home Living Family/patient expects to be discharged to:: Private residence Living Arrangements: Spouse/significant other;Children Available Help at Discharge: Family;Available 24 hours/day(daughter is home all day.) Type of Home: House Home Access: Ramped entrance     Home Layout: One level Home Equipment: [place] - 2 wheels;Walker - 4 wheels;Cane - single point;Hospital bed      Prior  Function Level of Independence: Needs assistance   Gait / Transfers Assistance Needed: Household ambulation with rollator, community ambulation with WC.  ADL's / Homemaking Assistance Needed: Son -in-law and daughter assist with groceries and meals.        Hand Dominance   Dominant Hand: Right    Extremity/Trunk Assessment   Upper Extremity Assessment Upper Extremity Assessment: Overall WFL for tasks assessed;RUE deficits/detail(LUE: Full AROM of shoulder, grip: BUE: 4-/5, elbow flex: BUE: 4/5, elbow ext: BUE: 4/5.) RUE Deficits / Details: Reports "frozen shoulder", flexion: 45  degrees before shrug sign. RUE Sensation: WNL    Lower Extremity Assessment Lower Extremity Assessment: Overall WFL for tasks assessed    Cervical / Trunk Assessment Cervical / Trunk Assessment: Kyphotic(Mild)  Communication   Communication: No difficulties  Cognition Arousal/Alertness: Awake/alert Behavior During Therapy: WFL for tasks assessed/performed Overall Cognitive Status: Within Functional Limits for tasks assessed                                        General Comments      Exercises Other Exercises Other Exercises: time to monitor vitals: 4 min Other Exercises: Discussion about HH PT and benefit to LBP, mobility in general to prevent deconditioning: 4 min   Assessment/Plan    PT Assessment Patient needs continued PT services  PT Problem List Decreased strength;Decreased mobility;Decreased activity tolerance       PT Treatment Interventions DME instruction;Therapeutic activities;Therapeutic exercise;Gait training;Patient/family education;Balance training;Functional mobility training    PT Goals (Current goals can be found in the Care Plan section)       Frequency Min 2X/week   Barriers to discharge        Co-evaluation               AM-PAC PT "6 Clicks" Mobility  Outcome Measure Help needed turning from your back to your side while in a flat bed without using bedrails?: None Help needed moving from lying on your back to sitting on the side of a flat bed without using bedrails?: A Little Help needed moving to and from a bed to a chair (including a wheelchair)?: A Little Help needed standing up from a chair using your arms (e.g., wheelchair or bedside chair)?: A Little Help needed to walk in hospital room?: A Little Help needed climbing 3-5 steps with a railing? : A Little 6 Click Score: 19    End of Session Equipment Utilized During Treatment: Gait belt Activity Tolerance: Patient limited by pain;Patient limited by  fatigue Patient left: in chair;with chair alarm set;with call bell/phone within reach Nurse Communication: Mobility status PT Visit Diagnosis: Unsteadiness on feet (R26.81);Muscle weakness (generalized) (M62.81);Pain Pain - Right/Left: (Low back.)    Time: 5409-8119 PT Time Calculation (min) (ACUTE ONLY): 22 min   Charges:   PT Evaluation $PT Eval Low Complexity: 1 Low PT Treatments $Therapeutic Activity: 8-22 mins       Roxanne Gates, PT, DPT   Roxanne Gates 11/05/2018, 10:31 AM

## 2018-11-05 NOTE — Progress Notes (Signed)
Dr. Rockey Situ in to see patient.  Requested to this RN to restart the amio drip at maintenance dosing until discharged.  Patient goes in and out of Afib.  Patient resting without complaints.

## 2018-11-06 LAB — BASIC METABOLIC PANEL
Anion gap: 15 (ref 5–15)
BUN: 59 mg/dL — ABNORMAL HIGH (ref 8–23)
CO2: 27 mmol/L (ref 22–32)
Calcium: 7.9 mg/dL — ABNORMAL LOW (ref 8.9–10.3)
Chloride: 90 mmol/L — ABNORMAL LOW (ref 98–111)
Creatinine, Ser: 7.35 mg/dL — ABNORMAL HIGH (ref 0.61–1.24)
GFR calc Af Amer: 8 mL/min — ABNORMAL LOW (ref >60)
GFR calc non Af Amer: 7 mL/min — ABNORMAL LOW (ref >60)
Glucose, Bld: 98 mg/dL (ref 70–99)
Potassium: 4 mmol/L (ref 3.5–5.1)
Sodium: 132 mmol/L — ABNORMAL LOW (ref 135–145)

## 2018-11-06 LAB — CBC
HCT: 39.2 % (ref 39.0–52.0)
Hemoglobin: 12.9 g/dL — ABNORMAL LOW (ref 13.0–17.0)
MCH: 33.8 pg (ref 26.0–34.0)
MCHC: 32.9 g/dL (ref 30.0–36.0)
MCV: 102.6 fL — ABNORMAL HIGH (ref 80.0–100.0)
Platelets: 282 10*3/uL (ref 150–400)
RBC: 3.82 MIL/uL — ABNORMAL LOW (ref 4.22–5.81)
RDW: 13.8 % (ref 11.5–15.5)
WBC: 6.7 10*3/uL (ref 4.0–10.5)
nRBC: 0.3 % — ABNORMAL HIGH (ref 0.0–0.2)

## 2018-11-06 LAB — MAGNESIUM: Magnesium: 2 mg/dL (ref 1.7–2.4)

## 2018-11-06 MED ORDER — CARVEDILOL 3.125 MG PO TABS: 3.1250 mg | ORAL_TABLET | Freq: Every day | ORAL | 0 refills | Status: DC

## 2018-11-06 MED ORDER — MIDODRINE HCL 5 MG PO TABS: 5.0000 mg | ORAL_TABLET | Freq: Three times a day (TID) | ORAL | 0 refills | Status: DC | PRN

## 2018-11-06 MED ORDER — AMIODARONE HCL 400 MG PO TABS: 400.0000 mg | ORAL_TABLET | Freq: Two times a day (BID) | ORAL | 0 refills | Status: DC

## 2018-11-06 MED ORDER — AMIODARONE HCL 200 MG PO TABS
200.0000 mg | ORAL_TABLET | Freq: Two times a day (BID) | ORAL | Status: DC
  Administered 2018-11-06 – 2018-11-07 (×2): 200 mg via ORAL
  Filled 2018-11-06 (×2): qty 1

## 2018-11-06 MED ORDER — ASPIRIN 81 MG PO CHEW: 81.0000 mg | CHEWABLE_TABLET | Freq: Every day | ORAL | 0 refills | Status: AC

## 2018-11-06 MED ORDER — AMIODARONE HCL 200 MG PO TABS: 200.0000 mg | ORAL_TABLET | Freq: Two times a day (BID) | ORAL | 0 refills | Status: DC

## 2018-11-06 MED ORDER — TICAGRELOR 90 MG PO TABS: 90.0000 mg | ORAL_TABLET | Freq: Two times a day (BID) | ORAL | 0 refills | Status: DC

## 2018-11-06 NOTE — Progress Notes (Signed)
Progress Note  Patient Name: William Carpenter Date of Encounter: 11/06/2018  Primary Cardiologist: [facility], follows at the [facility]  Subjective   Patient feels well.  Denies any recent chest pain, shortness of breath, or falls.  Reports he is unsteady on his feet due to neuropathy.  Inpatient Medications    Scheduled Meds:  artificial tears   Both Eyes BID   aspirin  81 mg Oral Daily   atorvastatin  80 mg Oral Daily   calcium acetate  1,334 mg Oral TID WC   carvedilol  3.125 mg Oral QHS   Chlorhexidine Gluconate Cloth  6 each Topical Q0600   cinacalcet  90 mg Oral Q breakfast   cycloSPORINE  1 drop Both Eyes BID   docusate sodium  100 mg Oral Daily   dorzolamide-timolol  1 drop Both Eyes BID   feeding supplement (NEPRO CARB STEADY)  237 mL Oral BID BM   latanoprost  1 drop Both Eyes QHS   loratadine  10 mg Oral Daily   midodrine  10 mg Oral Once   mirtazapine  15 mg Oral QHS   multivitamin  1 tablet Oral QHS   pantoprazole  40 mg Oral Daily   polyvinyl alcohol  1 drop Both Eyes Q4H while awake   sertraline  150 mg Oral QHS   sevelamer carbonate  0.8 g Oral TID WC   sodium chloride flush  3 mL Intravenous Q12H   ticagrelor  90 mg Oral BID   Continuous Infusions:  amiodarone 30 mg/hr (11/06/18 0741)   magnesium sulfate bolus IVPB Stopped (11/03/18 1222)   PRN Meds: acetaminophen, acetaminophen, LORazepam, Melatonin, methocarbamol, midodrine, morphine injection, nitroGLYCERIN, ondansetron (ZOFRAN) IV, oxyCODONE, senna-docusate   Vital Signs    Vitals:   11/06/18 1600 11/06/18 1615 11/06/18 1630 11/06/18 1635  BP: 120/82 120/78 110/74   Pulse: (!) 58 (!) 55 (!) 57   Resp: 13 12 14    Temp:    (!) 97.5 F (36.4 C)  TempSrc:    Oral  SpO2: 100% 100% 100%   Weight:      Height:        Intake/Output Summary (Last 24 hours) at 11/06/2018 1739 Last data filed at 11/06/2018 1635 Gross per 24 hour  Intake 333.14 ml  Output 1500 ml  Net  -1166.86 ml   Last 3 Weights 11/05/2018 11/04/2018 11/03/2018  Weight (lbs) 197 lb 12.8 oz 193 lb 6.4 oz 198 lb 3.1 oz  Weight (kg) 89.721 kg 87.726 kg 89.9 kg      Telemetry    Currently NSR and SR/SB, previously Afib converting on amiodarone drip- Personally Reviewed   ECG    No new tracings - Personally Reviewed  Physical Exam   GEN: No acute distress.   Neck: No JVD Cardiac: RRR, no murmurs, rubs, or gallops.  Respiratory: Clear to auscultation bilaterally. GI: Soft, nontender, non-distended  MS: No edema; No deformity. Neuro:  Nonfocal  Psych: Normal affect   Labs    High Sensitivity Troponin:   Recent Labs  Lab 10/30/18 1721 10/30/18 2316 10/31/18 0440 10/31/18 2133 10/31/18 2331  TROPONINIHS 318* 435* 459* 442* 423*      Cardiac EnzymesNo results for input(s): TROPONINI in the last 168 hours. No results for input(s): TROPIPOC in the last 168 hours.   Chemistry Recent Labs  Lab 10/31/18 0440 11/01/18 0557  11/04/18 0614 11/05/18 0527 11/06/18 0543  NA 136 132*   < > 135 132* 132*  K 4.4  5.6*   < > 4.2 4.1 4.0  CL 96* 93*   < > 94* 92* 90*  CO2 26 24   < > 28 26 27   GLUCOSE 89 135*   < > 81 90 98  BUN 42* 63*   < > 36* 48* 59*  CREATININE 5.72* 7.10*   < > 4.87* 6.08* 7.35*  CALCIUM 8.3* 8.1*   < > 7.9* 8.0* 7.9*  ALBUMIN 3.2* 3.3*  --  3.1*  --   --   GFRNONAA 10* 7*   < > 12* 9* 7*  GFRAA 11* 9*   < > 14* 10* 8*  ANIONGAP 14 15   < > 13 14 15    < > = values in this interval not displayed.     Hematology Recent Labs  Lab 11/03/18 0353 11/03/18 1837 11/04/18 0614 11/05/18 0527  WBC 9.7  --  7.8 7.5  RBC 3.36*  --  3.29* 3.42*  HGB 11.7* 12.3* 11.3* 11.6*  HCT 35.2* 37.2* 34.0* 35.1*  MCV 104.8*  --  103.3* 102.6*  MCH 34.8*  --  34.3* 33.9  MCHC 33.2  --  33.2 33.0  RDW 14.1  --  13.8 13.6  PLT 263  --  242 262    BNPNo results for input(s): BNP, PROBNP in the last 168 hours.   DDimer No results for input(s): DDIMER in the last  168 hours.   Radiology    No results found.  Cardiac Studies   [facility] 10/30/2018: Conclusions: 1. Severe three-vessel coronary artery disease with heavy calcification, including sequential 70% and 99% proximal and mid LAD stenoses (culprit lesions), long segment of irregular 50% distal LAD stenosis, 50% ostial LCx stenosis, severe disease involving diagonal branches and distal portion of OM2, and focal 90% stenosis in the proximal rPDA. 2. Moderately to severely reduced left ventricular systolic function with mid and apical anterior, apical, and apical inferior hypokinesis/akinesis. LVEF approximately 35%. 3. Mildly elevated left ventricular filling pressure. 4. Successful PCI to proximal and mid LAD using overlapping Synergy 3.5 x 16 mm and 3.0 x 28 mm drug-eluting stents with less than 10% residual stenosis and TIMI-3 flow.  Recommendations: 1. Continue cangrelor infusion for 2 hours after PCI. 2. Dual antiplatelet therapy with aspirin and ticagrelor for at least 12 months, ideally longer given extensive multivessel CAD. 3. Favor medical therapy of distal LAD, diagonal, OM2, and rPDA disease, though if patient has refractory chest pain, PCI to rPDA could be considered. 4. Aggressive secondary prevention of CAD and evidence-based heart failure therapy for ischemic cardiomyopathy. Carvedilol and losartan should be restarted carefully, as blood pressure allows. 5. Obtain transthoracic echocardiogram. 6. Remove right femoral artery sheath with manual compression when ACT has fallen below 175 seconds. __________  2D echo 10/30/2018: 1. Left ventricular ejection fraction, by visual estimation, is 50 to 55%. The left ventricle has normal function. Normal left ventricular size. There is no left ventricular hypertrophy. 2. Definity contrast agent was given IV to delineate the left ventricular endocardial borders. 3. Left ventricular diastolic Doppler parameters are consistent with  pseudonormalization pattern of LV diastolic filling. 4. There is apical akinesis, mid to apical anteroseptal akinesis. 5. Global right ventricle has normal systolic function.The right ventricular size is normal. Right vetricular wall thickness was not assessed. 6. Left atrial size was mild-moderately dilated. 7. Right atrial size was normal. 8. Mild calcification of the posterior mitral valve leaflet(s). 9. The mitral valve is degenerative. No evidence of mitral valve regurgitation.  10. The tricuspid valve is grossly normal. Tricuspid valve regurgitation was not visualized by color flow Doppler. 11. The aortic valve has an indeterminant number of cusps Aortic valve regurgitation was not visualized by color flow Doppler. Mild to moderate aortic valve sclerosis/calcification without any evidence of aortic stenosis. 12. The pulmonic valve was not well visualized. Pulmonic valve regurgitation is not visualized by color flow Doppler. 13. The inferior vena cava is normal in size with greater than 50% respiratory variability, suggesting right atrial pressure of 3 mmHg. __________  [facility] 11/01/2018: Conclusions: 1. Three-vessel coronary artery disease with heavy calcification, including long segment of irregular 50% distal LAD stenosis, 50% ostial LCx stenosis, severe disease involving diagonal branches and distal portion of OM2, and focal 90% stenosis in the proximal rPDA. Appearance is similar to cardiac catheterization two days ago. 2. Patent overlapping proximal and mid LAD stents with <10% stenosis in the proximal segment. 3. Moderately elevated left heart and pulmonary artery pressures. 4. Severely elevated right heart filling pressure. 5. Low normal to mildly reduced cardiac output/index. 6. Successful PCI to ostial/proximal rPDA using Resolute Onyx 2.25 x 15 mm drug-eluting stent (postdilated to 2.6 mm) with 0% residual stenosis and TIMI-3 flow.  Recommendations: 1. Continue dual  antiplatelet therapy with aspirin and ticagrelor for at least 12 months. If atrial fibrillation persists, I suggest transitioning ticagrelor to clopidogrel, discontinuing aspirin, and starting apixaban 5 mg twice daily. 2. Aggressive secondary prevention. 3. Escalate evidence-based heart failure therapy, as tolerating. 4. Hemodialysis today with as much fluid removal as tolerated. 5. Restart heparin infusion in 8 hours.  Patient Profile     63 y.o. 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  Assessment & Plan    Anterior ST elevation myocardial infarction s/p PCI CAD --No current CP. Recent presyncope / dizziness. Despite intervention, he has reportedly continued to have episodes of chest tightness thought more consistent with anxiety. --PCI/DES x2 to the proximal and mid LAD with recurrent angina and repeat cardiac cath with PCI to the RPDA. --Ordered CBC for today given recent melena and presyncope with need for DAPT for at least 12 months. Electrolytes currently at goal. --Continue DAPT for at least 12 months. Of note, this admission DAPT held for darker stools, tunnel vision on 10/30. It appears this has been restarted since then with iron panel yesterday performed. No further sx since that time. Continue BB as tolerated.  Escalation of guideline directed therapy has been limited this admission due to hypotension. --Monitor overnight. --Follow-up in our office in 1 week and while attempt to establish with a cardiologist at the [facility], given patient reported difficulty with obtaining a cardiologist in the past.   New onset Afib with RVR --Currently in NSR. Went into atrial fibrillation after stenting October  26.  Converted to NSR on amiodarone infusion over the weekend. Asx in Afib.  --Started on oral amiodarone load at 200mg  BID for two weeks then drop down to amiodarone 200mg  daily. Continue BB as tolerated for rate control. --Given unsteadiness, risks outweigh that of benefits on anticoagulation due to risk of bleed. --Monitor overnight. --Follow-up in the office as above within 1 week.   Melena  --Denies recurrent melena. Has not had a  bowel movement in 2 days. Consider FOBT. Monitor overnight.  WCT --WCT 10/28, thought possibly atrial flutter with aberrancy versus VT. --Electrolytes stable.  No further episodes since that time. --Continue amiodarone as above. Monitor overnight.  Dizziness/Presyncope  --Denies recent fall.  Reportedly unsteady when ambulating due to neuropathy. Monitor overnight.  HFrEF secondary to ICM --Volume status managed by hemodialysis. Currently euvolemic on exam.  --LVEF 35% by LV gram with EF 50 to 55% by embolization and apical HK consistent with LAD territory infarct. Repeat echo recommended in ~3 months to reassess s/p revascularization and PCI. --Continue beta-blocker.  ARB has been deferred given relative hypotension. Not on MRA due to ESRD.   ESRD --Followed closely by nephrology.  Continue hemodialysis per nephrology.  HLD --LDL 90 with goal LDL below 70.  Continue high intensity statin. Recommend recheck lipid and liver function in 6-8 weeks. Consider addition of Zetia if LDL remains above goal as an outpatient.     For questions or updates, please contact [place] Please consult [email or web address] for contact info under        Signed, Arvil Chaco, PA-C  11/06/2018, 5:39 PM

## 2018-11-06 NOTE — Care Management Important Message (Signed)
Important Message  Patient Details  Name: William Carpenter MRN: [id number] Date of Birth: 05/19/1955   Medicare Important Message Given:  Yes     Dannette Barbara 11/06/2018, 11:39 AM

## 2018-11-06 NOTE — Progress Notes (Signed)
   11/06/18 1321  Neurological  Level of Consciousness Alert  Orientation Level Oriented X4  Respiratory  Respiratory Pattern Regular;Unlabored  Bilateral Breath Sounds Clear;Diminished  Cardiac  Pulse Regular  ECG Monitor Yes  pt ARRIVED TO UNIT AT 1318 VIA WC NO C/OS NO DISTRESS NOTED AVF +/+ UFG 2L

## 2018-11-06 NOTE — Consult Note (Signed)
[place] for Electrolyte Monitoring and Replacement   Recent Labs: Potassium (mmol/L)  Date Value  11/06/2018 4.0  06/16/2013 4.4   Magnesium (mg/dL)  Date Value  11/06/2018 2.0   Calcium (mg/dL)  Date Value  11/06/2018 7.9 (L)   Calcium, Total (mg/dL)  Date Value  06/16/2013 8.9   Albumin (g/dL)  Date Value  11/04/2018 3.1 (L)  06/16/2013 3.3 (L)   Phosphorus (mg/dL)  Date Value  11/04/2018 2.9   Sodium (mmol/L)  Date Value  11/06/2018 132 (L)  06/16/2013 141   Corrected Ca: 8.56 mg/dL  Assessment: 63 y.o. male with a history of ESRD secondary to FSGS, HTN, HLD, and chronic pain, admitted with anterior STEMI  Goals of Therapy:  Magnesium ~ 2. All other electrolytes within normal limits.   Plan:  K 4.1  Mag 2.0  Scr 7.35 (MWF Hemodialysis pt)    No electrolyte replacement warranted at this time x 2 days  Will defer potassium replacement to nephrology-Hemodialysis pt.   Will re-check electrolytes Wed am  Lu Duffel, PharmD, BCPS Clinical Pharmacist 11/06/2018 7:34 AM

## 2018-11-06 NOTE — Progress Notes (Signed)
OT Cancellation Note  Patient Details Name: William Carpenter MRN: [id number] DOB: [birth year]   Cancelled Treatment:    Reason Eval/Treat Not Completed: Patient at procedure or test/ unavailable. Order received, chart reviewed. Pt out of room. Per chart, scheduled for HD. Will re-attempt OT evaluation at later date/time as pt is available and medically appropriate.  Jeni Salles, MPH, MS, OTR/L ascom [phone] 11/06/18, 1:48 PM

## 2018-11-06 NOTE — Progress Notes (Signed)
[facility] [address], [place] 11/06/18  Subjective:  Patient resting comfortably at bedside today. Due for hemodialysis today. No chest pain at the moment.    Objective:  Vital signs in last 24 hours:  Temp:  [97.4 F (36.3 C)-98.4 F (36.9 C)] 97.4 F (36.3 C) (11/02 1330) Pulse Rate:  [56-67] 56 (11/02 1445) Resp:  [14-20] 17 (11/02 1445) BP: (89-141)/(68-95) 96/71 (11/02 1445) SpO2:  [95 %-100 %] 100 % (11/02 1445)  Weight change:  Filed Weights   11/03/18 1630 11/04/18 0716 11/05/18 0343  Weight: 89.9 kg 87.7 kg 89.7 kg    Intake/Output:    Intake/Output Summary (Last 24 hours) at 11/06/2018 1522 Last data filed at 11/06/2018 0636 Gross per 24 hour  Intake 333.14 ml  Output 0 ml  Net 333.14 ml     Physical Exam: General:  No acute distress, sitting up in the chair at bedside  HEENT  anicteric, moist oral mucous membranes  Pulm/lungs  normal breathing effort on room air  CVS/Heart  irregular  Abdomen:   Soft, nontender  Extremities:  Trace edema  Neurologic:  Alert, oriented  Skin:  No acute rashes  Access:  Left forearm AV fistula       Basic Metabolic Panel:  Recent Labs  Lab 10/31/18 0440 11/01/18 0557  11/02/18 0624 11/03/18 0353 11/04/18 0614 11/05/18 0527 11/06/18 0543  NA 136 132*   < > 132* 135 135 132* 132*  K 4.4 5.6*   < > 5.4* 4.2 4.2 4.1 4.0  CL 96* 93*   < > 91* 91* 94* 92* 90*  CO2 26 24   < > 24 26 28 26 27   GLUCOSE 89 135*   < > 107* 85 81 90 98  BUN 42* 63*   < > 77* 52* 36* 48* 59*  CREATININE 5.72* 7.10*   < > 7.78* 5.47* 4.87* 6.08* 7.35*  CALCIUM 8.3* 8.1*   < > 7.8* 8.0* 7.9* 8.0* 7.9*  MG  --  1.8   < > 1.8 1.7 1.5* 2.0 2.0  PHOS 2.8 3.6  --   --   --  2.9  --   --    < > = values in this interval not displayed.     CBC: Recent Labs  Lab 11/01/18 0557 11/02/18 0624 11/03/18 0353 11/03/18 1837 11/04/18 0614 11/05/18 0527  WBC 7.1 6.9 9.7  --  7.8 7.5  HGB 11.1* 11.3* 11.7* 12.3* 11.3*  11.6*  HCT 32.8* 32.7* 35.2* 37.2* 34.0* 35.1*  MCV 101.5* 100.6* 104.8*  --  103.3* 102.6*  PLT 241 242 263  --  242 262      Lab Results  Component Value Date   HEPBSAG NON REACTIVE 10/30/2018      Microbiology:  Recent Results (from the past 240 hour(s))  Blood Culture (routine x 2)     Status: None   Collection Time: 10/30/18  2:16 AM   Specimen: BLOOD  Result Value Ref Range Status   Specimen Description BLOOD RIGHT FOREARM  Final   Special Requests   Final    BOTTLES DRAWN AEROBIC AND ANAEROBIC Blood Culture adequate volume   Culture   Final    NO GROWTH 5 DAYS Performed at [facility] [address], [place], [place] [address]    Report Status 11/04/2018 FINAL  Final  Blood Culture (routine x 2)     Status: None   Collection Time: 10/30/18  2:16 AM   Specimen:  BLOOD  Result Value Ref Range Status   Specimen Description BLOOD RIGHT ANTECUBITAL  Final   Special Requests   Final    BOTTLES DRAWN AEROBIC AND ANAEROBIC Blood Culture adequate volume   Culture   Final    NO GROWTH 5 DAYS Performed at [facility], [address] [place], [place] [address]    Report Status 11/04/2018 FINAL  Final  SARS Coronavirus 2 by RT PCR (hospital order, performed in [facility] hospital lab) Nasopharyngeal Nasopharyngeal Swab     Status: None   Collection Time: 10/30/18  2:16 AM   Specimen: Nasopharyngeal Swab  Result Value Ref Range Status   SARS Coronavirus 2 NEGATIVE NEGATIVE Final    Comment: (NOTE) If result is NEGATIVE SARS-CoV-2 target nucleic acids are NOT DETECTED. The SARS-CoV-2 RNA is generally detectable in upper and lower  respiratory specimens during the acute phase of infection. The lowest  concentration of SARS-CoV-2 viral copies this assay can detect is 250  copies / mL. A negative result does not preclude SARS-CoV-2 infection  and should not be used as the sole basis for treatment or other  patient management decisions.  A negative  result may occur with  improper specimen collection / handling, submission of specimen other  than nasopharyngeal swab, presence of viral mutation(s) within the  areas targeted by this assay, and inadequate number of viral copies  (<250 copies / mL). A negative result must be combined with clinical  observations, patient history, and epidemiological information. If result is POSITIVE SARS-CoV-2 target nucleic acids are DETECTED. The SARS-CoV-2 RNA is generally detectable in upper and lower  respiratory specimens dur ing the acute phase of infection.  Positive  results are indicative of active infection with SARS-CoV-2.  Clinical  correlation with patient history and other diagnostic information is  necessary to determine patient infection status.  Positive results do  not rule out bacterial infection or co-infection with other viruses. If result is PRESUMPTIVE POSTIVE SARS-CoV-2 nucleic acids MAY BE PRESENT.   A presumptive positive result was obtained on the submitted specimen  and confirmed on repeat testing.  While 2019 novel coronavirus  (SARS-CoV-2) nucleic acids may be present in the submitted sample  additional confirmatory testing may be necessary for epidemiological  and / or clinical management purposes  to differentiate between  SARS-CoV-2 and other Sarbecovirus currently known to infect humans.  If clinically indicated additional testing with an alternate test  methodology [phone]) is advised. The SARS-CoV-2 RNA is generally  detectable in upper and lower respiratory sp ecimens during the acute  phase of infection. The expected result is Negative. Fact Sheet for Patients:  StrictlyIdeas.no Fact Sheet for Healthcare Providers: BankingDealers.co.za This test is not yet approved or cleared by the [place] FDA and has been authorized for detection and/or diagnosis of SARS-CoV-2 by FDA under an Emergency Use Authorization  (EUA).  This EUA will remain in effect (meaning this test can be used) for the duration of the COVID-19 declaration under Section 564(b)(1) of the Act, 21 U.S.C. section 360bbb-3(b)(1), unless the authorization is terminated or revoked sooner. Performed at [facility], [address] [place], [place] [address]   MRSA PCR Screening     Status: None   Collection Time: 10/30/18  5:45 AM   Specimen: Nasopharyngeal  Result Value Ref Range Status   MRSA by PCR NEGATIVE NEGATIVE Final    Comment:        The GeneXpert MRSA Assay (FDA approved for NASAL  specimens only), is one component of a comprehensive MRSA colonization surveillance program. It is not intended to diagnose MRSA infection nor to guide or monitor treatment for MRSA infections. Performed at [facility], [address] [place], [place] [address]     Coagulation Studies: No results for input(s): LABPROT, INR in the last 72 hours.  Urinalysis: No results for input(s): COLORURINE, LABSPEC, PHURINE, GLUCOSEU, HGBUR, BILIRUBINUR, KETONESUR, PROTEINUR, UROBILINOGEN, NITRITE, LEUKOCYTESUR in the last 72 hours.  Invalid input(s): APPERANCEUR    Imaging: No results found.   Medications:   . amiodarone 30 mg/hr (11/06/18 0741)  . magnesium sulfate bolus IVPB Stopped (11/03/18 1222)   . artificial tears   Both Eyes BID  . aspirin  81 mg Oral Daily  . atorvastatin  80 mg Oral Daily  . calcium acetate  1,334 mg Oral TID WC  . carvedilol  3.125 mg Oral QHS  . Chlorhexidine Gluconate Cloth  6 each Topical Q0600  . cinacalcet  90 mg Oral Q breakfast  . cycloSPORINE  1 drop Both Eyes BID  . docusate sodium  100 mg Oral Daily  . dorzolamide-timolol  1 drop Both Eyes BID  . feeding supplement (NEPRO CARB STEADY)  237 mL Oral BID BM  . latanoprost  1 drop Both Eyes QHS  . loratadine  10 mg Oral Daily  . midodrine  10 mg Oral Once  . mirtazapine  15 mg Oral QHS  . multivitamin  1 tablet Oral QHS   . pantoprazole  40 mg Oral Daily  . polyvinyl alcohol  1 drop Both Eyes Q4H while awake  . sertraline  150 mg Oral QHS  . sevelamer carbonate  0.8 g Oral TID WC  . sodium chloride flush  3 mL Intravenous Q12H  . ticagrelor  90 mg Oral BID   acetaminophen, acetaminophen, LORazepam, Melatonin, methocarbamol, midodrine, morphine injection, nitroGLYCERIN, ondansetron (ZOFRAN) IV, oxyCODONE, senna-docusate  Assessment/ Plan:  63 y.o. male with ESRD, dialysis for almost 30 years, during the patient, recent cervical decompression, autoimmune skin disease, STEMI s/p PCI 2 DES to LAD on 10/30/2018 and 10/24/2018 is admitted for following problems:  Principal Problem:   STEMI (ST elevation myocardial infarction) ([place]) Active Problems:   STEMI involving left anterior descending coronary artery (HCC)   Chest pain of uncertain etiology   Ischemic cardiomyopathy  [facility] [place]/88 kg/ MWF/ 3.45 min. Left arm AVF  #. ESRD with mild hyperkalemia Patient with recent STEMI s/p PCI, stent placement 10/26, 10/28, 2020 -Patient due for hemodialysis today.  Orders have been prepared.  #. Anemia of CKD  Lab Results  Component Value Date   HGB 11.6 (L) 11/05/2018  Hemoglobin currently 11.6.  Holding off on Epogen given recent acute coronary syndrome.  #. SHPTH  No results found for: PTH Lab Results  Component Value Date   PHOS 2.9 11/04/2018  Phosphorus under good control at 2.9.  Continue to monitor serum phosphorus.  # STEMI Underwent emergent PCI 10/26.  Severe multivessel coronary disease with culprit lesion involving proximal and mid LAD treated with 2 drug-eluting stents.   Repeat heart cath on 10/28 showed right heart pressures were severely elevated. Another stent was placed at that time.     LOS: 7 Milynn Quirion 11/2/20203:22 PM  [place], [place]

## 2018-11-06 NOTE — Progress Notes (Signed)
PT Cancellation Note  Patient Details Name: William Carpenter MRN: [id number] DOB: 12/03/1955   Cancelled Treatment:    Reason Eval/Treat Not Completed: Patient at procedure or test/unavailable.  Pt out of room for HD.  Will re-attempt when time allows.  Roxanne Gates, PT, DPT  Roxanne Gates 11/06/2018, 2:51 PM

## 2018-11-06 NOTE — Progress Notes (Signed)
This note also relates to the following rows which could not be included: Pulse Rate - Cannot attach notes to unvalidated device data Resp - Cannot attach notes to unvalidated device data BP - Cannot attach notes to unvalidated device data SpO2 - Cannot attach notes to unvalidated device data    11/06/18 1635  Vital Signs  Temp (!) 97.5 F (36.4 C)  Temp Source Oral  Pulse Rate Source Monitor  BP Location Right Arm  BP Method Automatic  Patient Position (if appropriate) Lying  Oxygen Therapy  O2 Device Room Air  During Hemodialysis Assessment  Blood Flow Rate (mL/min) 400 mL/min  Arterial Pressure (mmHg) -130 mmHg  Venous Pressure (mmHg) 110 mmHg  Transmembrane Pressure (mmHg) 60 mmHg  Ultrafiltration Rate (mL/min) 680 mL/min  Dialysate Flow Rate (mL/min) 600 ml/min  Conductivity: Machine  13.8  HD Safety Checks Performed Yes  KECN 69 KECN  Dialysis Fluid Bolus Normal Saline  Bolus Amount (mL) 250 mL  Intra-Hemodialysis Comments Progressing as prescribed;Tolerated well;Tx completed  Post-Hemodialysis Assessment  Rinseback Volume (mL) 250 mL  KECN 69 V  Dialyzer Clearance Clear  Duration of HD Treatment -hour(s) 3 hour(s)  Hemodialysis Intake (mL) 500 mL  UF Total -Machine (mL) 2000 mL  Net UF (mL) 1500 mL  Tolerated HD Treatment Yes  AVG/AVF Arterial Site Held (minutes) 10 minutes  AVG/AVF Venous Site Held (minutes) 10 minutes  PT TOLERATED HD TX WELL UP IN CHAIR WITH NO C/OS NO DISTRESS NOTED UFG 2L AVF +/+

## 2018-11-06 NOTE — TOC Transition Note (Signed)
Transition of Care [facility]) - CM/SW Discharge Note   Patient Details  Name: William Carpenter MRN: [id number] Date of Birth: [birth year]  Transition of Care [facility]) CM/SW Contact:  Ross Ludwig, LCSW Phone Number: 11/06/2018, 5:01 PM   Clinical Narrative:     Patient will be discharging back home with home health through [facility].  CSW contacted [facility] to let them know that patient is discharging today.  Patient will have [place] and nursing.  Final next level of care: [place] Barriers to Discharge: Barriers Resolved   Patient Goals and CMS Choice Patient states their goals for this hospitalization and ongoing recovery are:: To return home with home health CMS Medicare.gov Compare Post Acute Care list provided to:: Patient Choice offered to / list presented to : Patient  Discharge Placement  Patient discharged back home.                     Discharge Plan and Services     Post Acute Care Choice: Home Health          DME Arranged: N/A DME Agency: NA       HH Arranged: RN, PT HH Agency: [place] Date HH Agency Contacted: 11/06/18 Time HH Agency Contacted: 1430 Representative spoke with at [place]: [place] ([place]) Interventions     Readmission Risk Interventions Readmission Risk Prevention Plan 11/05/2018  Transportation Screening Complete  HRI or [place] Complete  Social Work Consult for [place] Planning/Counseling Complete  Palliative Care Screening Not Applicable  Medication Review Press photographer) Complete  Some recent data might be hidden

## 2018-11-06 NOTE — Discharge Summary (Addendum)
Physician Discharge Summary  William Carpenter NUU:[id number] DOB: 11/17/1955 DOA: 10/30/2018  PCP: Raelyn Mora, MD  Admit date: 10/30/2018 Discharge date: 11/06/2018  Admitted From: Inpatient Disposition: home  Recommendations for Outpatient Follow-up:  1. Follow up with PCP in 1-2 weeks 2. F/up with Dr End/cardiology in 2 weeks:  Home Health:No Equipment/Devices:no new equip  Discharge Condition:Stable CODE STATUS:Full code Diet recommendation: Cardiac diet  Brief/Interim Summary: Per admitting provider: 63 y.o. male with pertinent past medical history of ESRD on HD (MWF), hypertension, hyperlipidemia, renal cell carcinoma, chronic pain syndrome and FSGS presenting to the ED with chief complaints of chest pain and fever.  Patient states he went to bed last night at around 11 and woke up with neck pain and left arm spasm, he reported neck pain is been ongoing since afternoon and evening prior to going to bed.  He states that he started having chest pain describing as "vice like" with associated shortness of breath and dry cough.  Denies palpitation, diaphoresis, nausea or vomiting, abdominal pain, or diarrhea. Patient states been feeling hot and cold intermittently but given new onset of chest pain he decided to call EMS.  On EMS arrival patient was noted to have fever of 102 and was placed on 2 L nasal cannula.   On arrival to the ED, he was afebrile with blood pressure 224/114 mm Hg and pulse rate 81 beats/min. There were no focal neurological deficits; he was alert and oriented x4, but noted to be in significant pain.  Initial ECG showed anterior ST segment elevation.  Initial labs revealed troponin I 32, lactic 1.2, BUN 60, creatinine 7.25, alk phos 212, bilirubin 1.7, WBC 12.7, lipase 53.  Given ongoing chest pain patient received aspirin, nitroglycerin, and IV heparin with minimal improvement in his pain.  Given EKG findings and ongoing chest pain, code STEMI was initiated  and patient evaluated by on-call cardiologist who recommended patient be taken to Cath Lab for emergent left heart catheterization with possible PCI.  Hospital course Acute STEMI-chest pain is improved today. As previously noted: -s/p cardiac catheterization on 10/26 with DES to the proximal and mid LAD -repeat cardiac cath 10/28 with stable disease and patent stents; rPDA was stented -CTA chest 10/27 showed CAD, but no dissection or PE -Cardiology following-discussed with them 11/1 agree with plan of care -Continue dual antiplatelet therapy with aspirin and brilinta for at least 12 months-hemoglobin remained stable we will not pursue triple therapy to include Lovenox given risks of bleeding and poor outcome -ECHO with EF 50-55% -Continue Coreg and Lipitor -No ACE/ARB due to hypotension  ParoxysmalA-fib with RVR -Restarted amiodarone drip 10/31, completed drip -Discussed with cardiology will treat with amiodarone 4 mg p.o. twice daily x5 days then follow-up with 271m p.o. twice daily with close follow-up with cardiology.  ?GI bleed- patient had a maroon-colored stool x 1. No drop in hemoglobin. -Held lovenox, started SCDs -Well continue aspirin and brilinta due to recent STEMI and new stents -Hemoglobin actually increased from 11.3-11.6, no additional bloody stools  Hypomagnesemia -2.0 -Repleted 10/31  ESRD on HD MWF -Nephrology following -Appreciate their input -Patient tolerated dialysis without complications while in-house -[facility]ble [facility] finish up and [facility]  Hyperlipidemia-LDL is 90 this admission -Continue home Lipitor  Hypertension -Patient will use Midodrine supplementally at home -Medication adjustments as above    Discharge Diagnoses:  Principal Problem:   STEMI (ST elevation myocardial infarction) ([facility] Active Problems:   STEMI  involving left anterior descending coronary artery (HCC)   Chest  pain of uncertain etiology   Ischemic cardiomyopathy    Discharge Instructions  Discharge Instructions    AMB Referral to Cardiac Rehabilitation - Phase II   Complete by: As directed    Diagnosis:  STEMI Coronary Stents     After initial evaluation and assessments completed: Virtual Based Care may be provided alone or in conjunction with Phase 2 Cardiac Rehab based on patient barriers.: Yes   Call MD for:  difficulty breathing, headache or visual disturbances   Complete by: As directed    Call MD for:  extreme fatigue   Complete by: As directed    Call MD for:  hives   Complete by: As directed    Call MD for:  persistant dizziness or light-headedness   Complete by: As directed    Call MD for:  persistant nausea and vomiting   Complete by: As directed    Call MD for:  severe uncontrolled pain   Complete by: As directed    Call MD for:  temperature >100.4   Complete by: As directed    Diet - low sodium heart healthy   Complete by: As directed    Increase activity slowly   Complete by: As directed      Allergies as of 11/06/2018      Reactions   Albumin (human) Hives   Aloe    Erythromycin Hives   Iodinated Diagnostic Agents Hives   Iron Dextran    Metoprolol Tartrate    Penicillins Swelling   Quinine Sulfate [quinine]    Rabeprazole    Sulfa Antibiotics Other (See Comments)   Tape Other (See Comments)   Removes skin.   Trimethoprim       Medication List    TAKE these medications   acetaminophen 325 MG tablet Commonly known as: TYLENOL Take 650 mg by mouth every 8 (eight) hours as needed (pain or fever).   amiodarone 400 MG tablet Commonly known as: Pacerone Take 1 tablet (400 mg total) by mouth 2 (two) times daily for 5 days.   amiodarone 200 MG tablet Commonly known as: Pacerone Take 1 tablet (200 mg total) by mouth 2 (two) times daily. Start taking on: November 12, 2018     aspirin 81 MG chewable tablet Chew 1 tablet (81 mg total) by mouth  daily. Start taking on: November 07, 2018   atorvastatin 40 MG tablet Commonly known as: LIPITOR Take 20 mg by mouth daily.   b complex-vitamin c-folic acid 0.8 MG Tabs tablet Take 1 tablet by mouth at bedtime.   betamethasone dipropionate 0.05 % ointment Commonly known as: DIPROLENE Apply topically 2 (two) times daily.   bisacodyl 10 MG suppository Commonly known as: DULCOLAX Place 10 mg rectally daily as needed for moderate constipation.   calcium acetate 667 MG capsule Commonly known as: PHOSLO Take 1,334 mg by mouth 3 (three) times daily with meals.   carboxymethylcellulose 0.5 % Soln Commonly known as: REFRESH PLUS 1 drop every 2 (two) hours while awake.   carvedilol 25 MG tablet Commonly known as: COREG Take 25 mg by mouth daily with a meal.   cetirizine 10 MG tablet Commonly known as: ZYRTEC Take 10 mg by mouth daily.   cinacalcet 90 MG tablet Commonly known as: SENSIPAR Take 90 mg by mouth daily.   clobetasol ointment 0.05 % Commonly known as: TEMOVATE Apply 1 application topically 2 (two) times daily.   cycloSPORINE 0.05 %  ophthalmic emulsion Commonly known as: RESTASIS Place 1 drop into both eyes 2 (two) times daily.   docusate sodium 100 MG capsule Commonly known as: COLACE Take 100 mg by mouth daily.   dorzolamidel-timolol 22.3-6.8 MG/ML Soln ophthalmic solution Commonly known as: COSOPT 1 drop 2 (two) times daily.   hydrALAZINE 10 MG tablet Commonly known as: APRESOLINE Take 10 mg by mouth every 4 (four) hours as needed (systolic BP >323FTDD).   hydrOXYzine 25 MG capsule Commonly known as: VISTARIL Take 25 mg by mouth 3 (three) times daily as needed.   ketoconazole 2 % shampoo Commonly known as: NIZORAL Apply 1 application topically 2 (two) times a week.   KONSYL-D PO Take 5 mLs by mouth 2 (two) times daily.   lactulose 10 GM/15ML solution Commonly known as: CHRONULAC Take by mouth 3 (three) times daily.   latanoprost 0.005 %  ophthalmic solution Commonly known as: XALATAN Place 1 drop into both eyes at bedtime.      Melatonin 5 MG Caps Take by mouth.   methocarbamol 500 MG tablet Commonly known as: ROBAXIN Take 500 mg by mouth 2 (two) times daily.   midodrine 5 MG tablet Commonly known as: PROAMATINE Take 1 tablet (5 mg total) by mouth 3 (three) times daily as needed (for BP < 220 systolic).   mirtazapine 15 MG tablet Commonly known as: REMERON Take 15 mg by mouth at bedtime.   mupirocin cream 2 % Commonly known as: BACTROBAN Apply 1 application topically 2 (two) times daily.   naloxone 4 MG/0.1ML Liqd nasal spray kit Commonly known as: NARCAN Place 1 spray into the nose.   omeprazole 20 MG capsule Commonly known as: PRILOSEC Take 20 mg by mouth 2 (two) times daily before a meal.   oxycodone 5 MG capsule Commonly known as: OXY-IR Take 5-10 mg by mouth every 4 (four) hours as needed for pain.   polyvinyl alcohol 1.4 % ophthalmic solution Commonly known as: LIQUIFILM TEARS Place 1 drop into both eyes 4 (four) times daily as needed for dry eyes.   REFRESH OPTIVE SENSITIVE OP Place 1 drop into both eyes every 4 (four) hours.   senna-docusate 8.6-50 MG tablet Commonly known as: Senokot-S Take 1 tablet by mouth 2 (two) times daily as needed (to prevent constipation; scheduled until bowel movement).   sertraline 50 MG tablet Commonly known as: ZOLOFT Take 150 mg by mouth at bedtime.   sevelamer carbonate 0.8 g Pack packet Commonly known as: RENVELA Take 0.8 g by mouth 3 (three) times daily with meals.   terbinafine 1 % cream Commonly known as: LAMISIL Apply 1 application topically 2 (two) times daily.   ticagrelor 90 MG Tabs tablet Commonly known as: BRILINTA Take 1 tablet (90 mg total) by mouth 2 (two) times daily.       Follow-up Information    [facility] Follow up.   Specialty: Cardiac Rehabilitation Why: Your Cardiologist has referred you to  outpatient Cardiac Rehab at [facility].  Per your discussion with Roanna Epley, Cardiac Nurse Navigator, the Cardiac Rehab dept will contact you within one to two weeks after discharge to schedule your first appointment.   Contact information: [place] [id number] [place] [address] [phone]         Allergies  Allergen Reactions  . Albumin (Human) Hives  . Aloe   . Erythromycin Hives  . Iodinated Diagnostic Agents Hives  . Iron Dextran   . Metoprolol Tartrate   . Penicillins  Swelling  . Quinine Sulfate [Quinine]   . Rabeprazole   . Sulfa Antibiotics Other (See Comments)  . Tape Other (See Comments)    Removes skin.  . Trimethoprim     Consultations:  cards   Procedures/Studies: Dg Chest Port 1 View  Result Date: 10/30/2018 CLINICAL DATA:  63 year old male with fever. EXAM: PORTABLE CHEST 1 VIEW COMPARISON:  Chest radiograph dated 11/22/2017. FINDINGS: There is shallow inspiration with left lung base atelectasis or infiltrate. Left peripheral and subpleural streaky densities may represent atelectatic changes or developing infiltrate. There is an increased density in the right hilar region which may represent confluence of prominent hilar vessels and related to underlying pulmonary hypertension. An infiltrate or mass is not excluded. Clinical correlation and follow-up to resolution recommended. There is atherosclerotic calcification of the aortic arch. Increased tissue density surrounding the aortic arch may represent atelectatic changes in the adjacent lung. If there is clinical concern for acute aortic pathology further evaluation with CT with IV contrast is recommended. No large pleural effusion or pneumothorax. Stable cardiomegaly. No acute osseous pathology. Cervical ACDF. IMPRESSION: 1. Left lung base streaky densities may represent atelectasis or infiltrate. 2. Increased density in the right hilar region may represent dilated hilar vessels. An  infiltrate or mass is not excluded. Clinical correlation and follow-up to resolution recommended. 3. Crescentic density adjacent to the aortic arch may represent atelectasis in the adjacent lung. If there is clinical concern for acute aortic pathology, further evaluation with CT with IV contrast is recommended. 4. Stable cardiomegaly. Electronically Signed   By: Anner Crete M.D.   On: 10/30/2018 02:44   Ct Angio Chest/abd/pel For Dissection W And/or W/wo  Result Date: 10/31/2018 CLINICAL DATA:  63 year old male with history of acute onset of chest and back pain. Suspected aortic dissection. EXAM: CT ANGIOGRAPHY CHEST, ABDOMEN AND PELVIS TECHNIQUE: Multidetector CT imaging through the chest, abdomen and pelvis was performed using the standard protocol during bolus administration of intravenous contrast. Multiplanar reconstructed images and MIPs were obtained and reviewed to evaluate the vascular anatomy. CONTRAST:  125m OMNIPAQUE IOHEXOL 350 MG/ML SOLN COMPARISON:  CT the chest, abdomen and pelvis 06/26/2016. FINDINGS: CTA CHEST FINDINGS Cardiovascular: There is no crescentic high attenuation associated with the wall of the thoracic aorta on precontrast images to suggest acute intramural hematoma. No evidence of thoracic aortic aneurysm or dissection. Ascending thoracic aorta aortic arch and descending thoracic aorta measure 3.6 cm, 3.0 cm and 2.5 cm in diameter respectively. 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 There is no significant pericardial fluid, thickening or pericardial calcification. There is aortic atherosclerosis, as well as atherosclerosis of the great vessels of the mediastinum and the coronary arteries, including calcified atherosclerotic plaque in the left main, left anterior descending, left circumflex and  right coronary arteries. Calcifications of the aortic valve. Dilatation of the pulmonic trunk (3.8 cm in diameter). Mediastinum/Nodes: No pathologically enlarged mediastinal or hilar lymph nodes. Esophagus is unremarkable in appearance. No axillary lymphadenopathy. Lungs/Pleura: Small calcified granuloma in the apex of the left upper lobe. 5 mm right upper lobe pulmonary nodule (axial image 78 of series 6), stable compared to the prior study, considered definitively benign. No other suspicious appearing pulmonary nodules or masses are noted. Linear scarring in the left lower lobe, similar to the prior study. Trace left pleural effusion, new compared to the prior study.  No acute consolidative airspace disease. Musculoskeletal: Diffuse sclerosis throughout the visualized axial and appendicular skeleton, potentially related to hyperparathyroidism in this patient with history of end stage renal disease. Review of the MIP images confirms the above findings. CTA ABDOMEN AND PELVIS FINDINGS VASCULAR Aorta: Normal caliber aorta without aneurysm, dissection, vasculitis or significant stenosis. Celiac: Patent without evidence of aneurysm, dissection, vasculitis or significant stenosis. SMA: Patent without evidence of aneurysm, dissection, vasculitis or significant stenosis. Renals: Right renal artery is patent without evidence of aneurysm, dissection, vasculitis, fibromuscular dysplasia or significant stenosis. IMA: Patent without evidence of aneurysm, dissection, vasculitis or significant stenosis. Inflow: Patent without evidence of aneurysm, dissection, vasculitis or significant stenosis. Veins: No obvious venous abnormality within the limitations of this arterial phase study. Review of the MIP images confirms the above findings. NON-VASCULAR Hepatobiliary: No suspicious cystic or solid hepatic lesions are confidently identified on today's arterial phase examination. High attenuation lying dependently in the gallbladder,  which presumably represents biliary sludge lying dependently. No surrounding inflammatory changes to suggest an acute cholecystitis at this time. Pancreas: No pancreatic mass. No pancreatic ductal dilatation. No pancreatic or peripancreatic fluid collections or inflammatory changes. Spleen: Status post splenectomy. Adrenals/Urinary Tract: Status post left nephrectomy. Right native kidney is severely atrophic with innumerable small low-attenuation lesions throughout the kidney, presumably cysts related to cystic disease of dialysis. 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 No hydroureteronephrosis. Urinary bladder is completely decompressed. Right adrenal gland is normal in appearance. Left adrenal gland is not confidently identified may be surgically absent. Stomach/Bowel: Normal appearance of the stomach. No pathologic dilatation of small bowel or colon. Numerous colonic diverticulae are noted, with some subtle surrounding inflammatory changes, which could be indicative of an acute diverticulitis, but are more likely related to mild edema in this patient with history of renal failure. The appendix is not confidently identified and may be surgically absent. Regardless, there are no inflammatory changes noted adjacent to the cecum to suggest the presence of an acute appendicitis at this time. Lymphatic: Multiple prominent borderline enlarged lymph nodes are noted throughout the pelvis measuring up to 9 mm in short axis, nonspecific. No pathologically enlarged lymph nodes identified in the abdomen or pelvis. Reproductive: Prostate gland and seminal vesicles are unremarkable in appearance. Other: Trace volume of ascites.  No pneumoperitoneum. Musculoskeletal: Diffuse sclerosis throughout the visualized axial and appendicular skeleton, favored to be related to hyperparathyroidism in this patient with history of end-stage renal disease. Status post laminectomy from  L3-L5. Review of the MIP images confirms the above findings. IMPRESSION: 1. No evidence of aortic dissection or acute aortic syndrome. 2. Trace left pleural effusion. 3. Aortic atherosclerosis, in addition to left main and 3 vessel coronary artery disease. Please note that although the presence of coronary artery calcium documents the presence of coronary artery disease, the severity of this disease and any potential stenosis cannot be assessed on this non-gated CT examination. Assessment for potential risk factor modification, dietary therapy or pharmacologic therapy may be warranted, if clinically indicated. 4. Dilatation of the pulmonic trunk (3.8 cm in diameter), concerning for pulmonary arterial hypertension. 5. There are calcifications of the aortic valve. Echocardiographic correlation for evaluation of potential valvular dysfunction may be warranted if clinically indicated. 6. Colonic diverticulosis without definitive findings to suggest an acute diverticulitis at this time. There are subtle areas of stranding adjacent to the sigmoid colon which are favored to be related to a background state of mild soft tissue edema  in this patient with history of end stage renal disease. 7. Additional incidental findings, as above. Electronically Signed   By: Vinnie Langton M.D.   On: 10/31/2018 14:34       Subjective:   Discharge Exam: Vitals:   11/06/18 0528 11/06/18 0743  BP: 107/76 101/75  Pulse: (!) 59 64  Resp:  19  Temp: 97.6 F (36.4 C) 98.4 F (36.9 C)  SpO2: 95% 96%   Vitals:   11/05/18 1804 11/05/18 2050 11/06/18 0528 11/06/18 0743  BP: (!) 141/95 122/82 107/76 101/75  Pulse: 67 64 (!) 59 64  Resp: 18   19  Temp: (!) 97.5 F (36.4 C) 98.4 F (36.9 C) 97.6 F (36.4 C) 98.4 F (36.9 C)  TempSrc: Oral Oral Oral Oral  SpO2: 97% 98% 95% 96%  Weight:      Height:        General: Pt is alert, awake, not in acute distress Cardiovascular: RRR, S1/S2 +, no rubs, no  gallops Respiratory: CTA bilaterally, no wheezing, no rhonchi Abdominal: Soft, NT, ND, bowel sounds + Extremities: no edema, no cyanosis    The results of significant diagnostics from this hospitalization (including imaging, microbiology, ancillary and laboratory) are listed below for reference.     Microbiology: Recent Results (from the past 240 hour(s))  Blood Culture (routine x 2)     Status: None   Collection Time: 10/30/18  2:16 AM   Specimen: BLOOD  Result Value Ref Range Status   Specimen Description BLOOD RIGHT FOREARM  Final   Special Requests   Final    BOTTLES DRAWN AEROBIC AND ANAEROBIC Blood Culture adequate volume   Culture   Final    NO GROWTH 5 DAYS Performed at [facility], [address], [place], [place] [address]    Report Status 11/04/2018 FINAL  Final  Blood Culture (routine x 2)     Status: None   Collection Time: 10/30/18  2:16 AM   Specimen: BLOOD  Result Value Ref Range Status   Specimen Description BLOOD RIGHT ANTECUBITAL  Final   Special Requests   Final    BOTTLES DRAWN AEROBIC AND ANAEROBIC Blood Culture adequate volume   Culture   Final    NO GROWTH 5 DAYS Performed at [facility], [address] [place],  [address]    Report Status 11/04/2018 FINAL  Final  SARS Coronavirus 2 by RT PCR (hospital order, performed in [place] hospital lab) Nasopharyngeal Nasopharyngeal Swab     Status: None   Collection Time: 10/30/18  2:16 AM   Specimen: Nasopharyngeal Swab  Result Value Ref Range Status   SARS Coronavirus 2 NEGATIVE NEGATIVE Final    Comment: (NOTE) If result is NEGATIVE SARS-CoV-2 target nucleic acids are NOT DETECTED. The SARS-CoV-2 RNA is generally detectable in upper and lower  respiratory specimens during the acute phase of infection. The lowest  concentration of SARS-CoV-2 viral copies this assay can detect is 250  copies / mL. A negative result does not preclude SARS-CoV-2 infection  and should not  be used as the sole basis for treatment or other  patient management decisions.  A negative result may occur with  improper specimen collection / handling, submission of specimen other  than nasopharyngeal swab, presence of viral mutation(s) within the  areas targeted by this assay, and inadequate number of viral copies  (<250 copies / mL). A negative result must be combined with clinical  observations, patient history, and epidemiological information. If  result is POSITIVE SARS-CoV-2 target nucleic acids are DETECTED. The SARS-CoV-2 RNA is generally detectable in upper and lower  respiratory specimens dur ing the acute phase of infection.  Positive  results are indicative of active infection with SARS-CoV-2.  Clinical  correlation with patient history and other diagnostic information is  necessary to determine patient infection status.  Positive results do  not rule out bacterial infection or co-infection with other viruses. If result is PRESUMPTIVE POSTIVE SARS-CoV-2 nucleic acids MAY BE PRESENT.   A presumptive positive result was obtained on the submitted specimen  and confirmed on repeat testing.  While 2019 novel coronavirus  (SARS-CoV-2) nucleic acids may be present in the submitted sample  additional confirmatory testing may be necessary for epidemiological  and / or clinical management purposes  to differentiate between  SARS-CoV-2 and other Sarbecovirus currently known to infect humans.  If clinically indicated additional testing with an alternate test  methodology [phone]) is advised. The SARS-CoV-2 RNA is generally  detectable in upper and lower respiratory sp ecimens during the acute  phase of infection. The expected result is Negative. Fact Sheet for Patients:  StrictlyIdeas.no Fact Sheet for Healthcare Providers: BankingDealers.co.za This test is not yet approved or cleared by the [place] FDA and has been authorized  for detection and/or diagnosis of SARS-CoV-2 by FDA under an Emergency Use Authorization (EUA).  This EUA will remain in effect (meaning this test can be used) for the duration of the COVID-19 declaration under Section 564(b)(1) of the Act, 21 U.S.C. section 360bbb-3(b)(1), unless the authorization is terminated or revoked sooner. Performed at [facility], [address] [place], [place] [address]   MRSA PCR Screening     Status: None   Collection Time: 10/30/18  5:45 AM   Specimen: Nasopharyngeal  Result Value Ref Range Status   MRSA by PCR NEGATIVE NEGATIVE Final    Comment:        The GeneXpert MRSA Assay (FDA approved for NASAL specimens only), is one component of a comprehensive MRSA colonization surveillance program. It is not intended to diagnose MRSA infection nor to guide or monitor treatment for MRSA infections. Performed at [facility], [address] [place], [place] [address]      Labs: BNP (last 3 results) No results for input(s): BNP in the last 8760 hours. Basic Metabolic Panel: Recent Labs  Lab 10/30/18 1516  10/31/18 0440 11/01/18 0557  11/02/18 3295 11/03/18 0353 11/04/18 0614 11/05/18 0527 11/06/18 0543  NA  --    < > 136 132*   < > 132* 135 135 132* 132*  K  --    < > 4.4 5.6*   < > 5.4* 4.2 4.2 4.1 4.0  CL  --    < > 96* 93*   < > 91* 91* 94* 92* 90*  CO2  --    < > 26 24   < > 24 26 28 26 27   GLUCOSE  --    < > 89 135*   < > 107* 85 81 90 98  BUN  --    < > 42* 63*   < > 77* 52* 36* 48* 59*  CREATININE  --    < > 5.72* 7.10*   < > 7.78* 5.47* 4.87* 6.08* 7.35*  CALCIUM  --    < > 8.3* 8.1*   < > 7.8* 8.0* 7.9* 8.0* 7.9*  MG  --   --   --  1.8   < >  1.8 1.7 1.5* 2.0 2.0  PHOS 2.5  --  2.8 3.6  --   --   --  2.9  --   --    < > = values in this interval not displayed.   Liver Function Tests: Recent Labs  Lab 10/31/18 0440 11/01/18 0557 11/04/18 0614  ALBUMIN 3.2* 3.3* 3.1*   No results for input(s): LIPASE,  AMYLASE in the last 168 hours. No results for input(s): AMMONIA in the last 168 hours. CBC: Recent Labs  Lab 11/01/18 0557 11/02/18 0624 11/03/18 0353 11/03/18 1837 11/04/18 0614 11/05/18 0527  WBC 7.1 6.9 9.7  --  7.8 7.5  HGB 11.1* 11.3* 11.7* 12.3* 11.3* 11.6*  HCT 32.8* 32.7* 35.2* 37.2* 34.0* 35.1*  MCV 101.5* 100.6* 104.8*  --  103.3* 102.6*  PLT 241 242 263  --  242 262   Cardiac Enzymes: No results for input(s): CKTOTAL, CKMB, CKMBINDEX, TROPONINI in the last 168 hours. BNP: Invalid input(s): POCBNP CBG: No results for input(s): GLUCAP in the last 168 hours. D-Dimer No results for input(s): DDIMER in the last 72 hours. Hgb A1c No results for input(s): HGBA1C in the last 72 hours. Lipid Profile No results for input(s): CHOL, HDL, LDLCALC, TRIG, CHOLHDL, LDLDIRECT in the last 72 hours. Thyroid function studies No results for input(s): TSH, T4TOTAL, T3FREE, THYROIDAB in the last 72 hours.  Invalid input(s): FREET3 Anemia work up Recent Labs    11/05/18 0527  VITAMINB12 1,033*  FOLATE 43.0  FERRITIN 469*  TIBC 180*  IRON 32*   Urinalysis No results found for: COLORURINE, APPEARANCEUR, LABSPEC, [place], GLUCOSEU, [place], [place], KETONESUR, [place], UROBILINOGEN, NITRITE, LEUKOCYTESUR Sepsis Labs Invalid input(s): PROCALCITONIN,  WBC,  LACTICIDVEN Microbiology Recent Results (from the past 240 hour(s))  Blood Culture (routine x 2)     Status: None   Collection Time: 10/30/18  2:16 AM   Specimen: BLOOD  Result Value Ref Range Status   Specimen Description BLOOD RIGHT FOREARM  Final   Special Requests   Final    BOTTLES DRAWN AEROBIC AND ANAEROBIC Blood Culture adequate volume   Culture   Final    NO GROWTH 5 DAYS Performed at [facility], [address] [place], [place] [address]    Report Status 11/04/2018 FINAL  Final  Blood Culture (routine x 2)     Status: None   Collection Time: 10/30/18  2:16 AM   Specimen: BLOOD  Result Value  Ref Range Status   Specimen Description BLOOD RIGHT ANTECUBITAL  Final   Special Requests   Final    BOTTLES DRAWN AEROBIC AND ANAEROBIC Blood Culture adequate volume   Culture   Final    NO GROWTH 5 DAYS Performed at [facility], [address] [place], [place] [address]    Report Status 11/04/2018 FINAL  Final  SARS Coronavirus 2 by RT PCR (hospital order, performed in [place] hospital lab) Nasopharyngeal Nasopharyngeal Swab     Status: None   Collection Time: 10/30/18  2:16 AM   Specimen: Nasopharyngeal Swab  Result Value Ref Range Status   SARS Coronavirus 2 NEGATIVE NEGATIVE Final    Comment: (NOTE) If result is NEGATIVE SARS-CoV-2 target nucleic acids are NOT DETECTED. The SARS-CoV-2 RNA is generally detectable in upper and lower  respiratory specimens during the acute phase of infection. The lowest  concentration of SARS-CoV-2 viral copies this assay can detect is 250  copies / mL. A negative result does not preclude SARS-CoV-2 infection  and should  not be used as the sole basis for treatment or other  patient management decisions.  A negative result may occur with  improper specimen collection / handling, submission of specimen other  than nasopharyngeal swab, presence of viral mutation(s) within the  areas targeted by this assay, and inadequate number of viral copies  (<250 copies / mL). A negative result must be combined with clinical  observations, patient history, and epidemiological information. If result is POSITIVE SARS-CoV-2 target nucleic acids are DETECTED. The SARS-CoV-2 RNA is generally detectable in upper and lower  respiratory specimens dur ing the acute phase of infection.  Positive  results are indicative of active infection with SARS-CoV-2.  Clinical  correlation with patient history and other diagnostic information is  necessary to determine patient infection status.  Positive results do  not rule out bacterial infection or co-infection  with other viruses. If result is PRESUMPTIVE POSTIVE SARS-CoV-2 nucleic acids MAY BE PRESENT.   A presumptive positive result was obtained on the submitted specimen  and confirmed on repeat testing.  While 2019 novel coronavirus  (SARS-CoV-2) nucleic acids may be present in the submitted sample  additional confirmatory testing may be necessary for epidemiological  and / or clinical management purposes  to differentiate between  SARS-CoV-2 and other Sarbecovirus currently known to infect humans.  If clinically indicated additional testing with an alternate test  methodology [phone]) is advised. The SARS-CoV-2 RNA is generally  detectable in upper and lower respiratory sp ecimens during the acute  phase of infection. The expected result is Negative. Fact Sheet for Patients:  StrictlyIdeas.no Fact Sheet for Healthcare Providers: BankingDealers.co.za This test is not yet approved or cleared by the [place] FDA and has been authorized for detection and/or diagnosis of SARS-CoV-2 by FDA under an Emergency Use Authorization (EUA).  This EUA will remain in effect (meaning this test can be used) for the duration of the COVID-19 declaration under Section 564(b)(1) of the Act, 21 U.S.C. section 360bbb-3(b)(1), unless the authorization is terminated or revoked sooner. Performed at [facility], [address] [place], [place] [address]   MRSA PCR Screening     Status: None   Collection Time: 10/30/18  5:45 AM   Specimen: Nasopharyngeal  Result Value Ref Range Status   MRSA by PCR NEGATIVE NEGATIVE Final    Comment:        The GeneXpert MRSA Assay (FDA approved for NASAL specimens only), is one component of a comprehensive MRSA colonization surveillance program. It is not intended to diagnose MRSA infection nor to guide or monitor treatment for MRSA infections. Performed at [facility], [address]  [place], [place] [address]      Time coordinating discharge: Over 30 minutes  SIGNED:   Nicolette Bang, MD  [facility] 11/06/2018, 1:40 PM Pager   If 7PM-7AM, please contact night-coverage [email or web address] Password [email or web address]

## 2018-11-07 MED ORDER — AMIODARONE HCL 200 MG PO TABS: 200.0000 mg | ORAL_TABLET | Freq: Two times a day (BID) | ORAL | 0 refills | Status: DC

## 2018-11-07 MED ORDER — TICAGRELOR 90 MG PO TABS: 90.0000 mg | ORAL_TABLET | Freq: Two times a day (BID) | ORAL | 0 refills | Status: DC

## 2018-11-07 MED ORDER — MIDODRINE HCL 5 MG PO TABS: 5.0000 mg | ORAL_TABLET | Freq: Three times a day (TID) | ORAL | 0 refills | Status: AC | PRN

## 2018-11-07 MED ORDER — AMIODARONE HCL 400 MG PO TABS: 400.0000 mg | ORAL_TABLET | Freq: Two times a day (BID) | ORAL | 0 refills | Status: DC

## 2018-11-07 MED ORDER — CARVEDILOL 3.125 MG PO TABS: 3.1250 mg | ORAL_TABLET | Freq: Every day | ORAL | 0 refills | Status: DC

## 2018-11-07 NOTE — Progress Notes (Signed)
Patient taken off the floor via wheelchair by staff. No distress noted.

## 2018-11-07 NOTE — Progress Notes (Signed)
[facility], [place] 11/07/18  Subjective:  Late entry. Patient seen at bedside this a.m. prior to discharge. He was feeling much better. Tolerated dialysis well yesterday.   Objective:  Vital signs in last 24 hours:  Temp:  [97.5 F (36.4 C)-98.4 F (36.9 C)] 98.4 F (36.9 C) (11/03 0727) Pulse Rate:  [52-116] 62 (11/03 0727) Resp:  [12-19] 19 (11/03 0727) BP: (89-132)/(68-84) 105/75 (11/03 0727) SpO2:  [88 %-100 %] 96 % (11/03 0727) Weight:  [88 kg] 88 kg (11/03 0524)  Weight change:  Filed Weights   11/04/18 0716 11/05/18 0343 11/07/18 0524  Weight: 87.7 kg 89.7 kg 88 kg    Intake/Output:    Intake/Output Summary (Last 24 hours) at 11/07/2018 1420 Last data filed at 11/07/2018 0935 Gross per 24 hour  Intake 480 ml  Output 1500 ml  Net -1020 ml     Physical Exam: General:  No acute distress, laying in bed  HEENT  anicteric, moist oral mucous membranes  Pulm/lungs  clear bilateral, normal effort  CVS/Heart  irregular  Abdomen:   Soft, nontender, bowel sounds present  Extremities:  Trace edema  Neurologic:  Alert, oriented  Skin:  No acute rashes  Access:  Left forearm AV fistula       Basic Metabolic Panel:  Recent Labs  Lab 11/01/18 0557  11/02/18 0624 11/03/18 0353 11/04/18 0614 11/05/18 0527 11/06/18 0543  NA 132*   < > 132* 135 135 132* 132*  K 5.6*   < > 5.4* 4.2 4.2 4.1 4.0  CL 93*   < > 91* 91* 94* 92* 90*  CO2 24   < > 24 26 28 26 27   GLUCOSE 135*   < > 107* 85 81 90 98  BUN 63*   < > 77* 52* 36* 48* 59*  CREATININE 7.10*   < > 7.78* 5.47* 4.87* 6.08* 7.35*  CALCIUM 8.1*   < > 7.8* 8.0* 7.9* 8.0* 7.9*  MG 1.8   < > 1.8 1.7 1.5* 2.0 2.0  PHOS 3.6  --   --   --  2.9  --   --    < > = values in this interval not displayed.     CBC: Recent Labs  Lab 11/02/18 0624 11/03/18 0353 11/03/18 1837 11/04/18 0614 11/05/18 0527 11/06/18 1901  WBC 6.9 9.7  --  7.8 7.5 6.7  HGB 11.3* 11.7* 12.3* 11.3* 11.6*  12.9*  HCT 32.7* 35.2* 37.2* 34.0* 35.1* 39.2  MCV 100.6* 104.8*  --  103.3* 102.6* 102.6*  PLT 242 263  --  242 262 282      Lab Results  Component Value Date   HEPBSAG NON REACTIVE 10/30/2018      Microbiology:  Recent Results (from the past 240 hour(s))  Blood Culture (routine x 2)     Status: None   Collection Time: 10/30/18  2:16 AM   Specimen: BLOOD  Result Value Ref Range Status   Specimen Description BLOOD RIGHT FOREARM  Final   Special Requests   Final    BOTTLES DRAWN AEROBIC AND ANAEROBIC Blood Culture adequate volume   Culture   Final    NO GROWTH 5 DAYS Performed at [facility], [address], [place], [place] [address]    Report Status 11/04/2018 FINAL  Final  Blood Culture (routine x 2)     Status: None   Collection Time: 10/30/18  2:16 AM   Specimen: BLOOD  Result Value Ref Range  Status   Specimen Description BLOOD RIGHT ANTECUBITAL  Final   Special Requests   Final    BOTTLES DRAWN AEROBIC AND ANAEROBIC Blood Culture adequate volume   Culture   Final    NO GROWTH 5 DAYS Performed at [facility], [address] [place], [place] [address]    Report Status 11/04/2018 FINAL  Final  SARS Coronavirus 2 by RT PCR (hospital order, performed in [facility] hospital lab) Nasopharyngeal Nasopharyngeal Swab     Status: None   Collection Time: 10/30/18  2:16 AM   Specimen: Nasopharyngeal Swab  Result Value Ref Range Status   SARS Coronavirus 2 NEGATIVE NEGATIVE Final    Comment: (NOTE) If result is NEGATIVE SARS-CoV-2 target nucleic acids are NOT DETECTED. The SARS-CoV-2 RNA is generally detectable in upper and lower  respiratory specimens during the acute phase of infection. The lowest  concentration of SARS-CoV-2 viral copies this assay can detect is 250  copies / mL. A negative result does not preclude SARS-CoV-2 infection  and should not be used as the sole basis for treatment or other  patient management decisions.  A negative  result may occur with  improper specimen collection / handling, submission of specimen other  than nasopharyngeal swab, presence of viral mutation(s) within the  areas targeted by this assay, and inadequate number of viral copies  (<250 copies / mL). A negative result must be combined with clinical  observations, patient history, and epidemiological information. If result is POSITIVE SARS-CoV-2 target nucleic acids are DETECTED. The SARS-CoV-2 RNA is generally detectable in upper and lower  respiratory specimens dur ing the acute phase of infection.  Positive  results are indicative of active infection with SARS-CoV-2.  Clinical  correlation with patient history and other diagnostic information is  necessary to determine patient infection status.  Positive results do  not rule out bacterial infection or co-infection with other viruses. If result is PRESUMPTIVE POSTIVE SARS-CoV-2 nucleic acids MAY BE PRESENT.   A presumptive positive result was obtained on the submitted specimen  and confirmed on repeat testing.  While 2019 novel coronavirus  (SARS-CoV-2) nucleic acids may be present in the submitted sample  additional confirmatory testing may be necessary for epidemiological  and / or clinical management purposes  to differentiate between  SARS-CoV-2 and other Sarbecovirus currently known to infect humans.  If clinically indicated additional testing with an alternate test  methodology [phone] is advised. The SARS-CoV-2 RNA is generally  detectable in upper and lower respiratory sp ecimens during the acute  phase of infection. The expected result is Negative. Fact Sheet for Patients:  StrictlyIdeas.no Fact Sheet for Healthcare Providers: BankingDealers.co.za This test is not yet approved or cleared by the [place] FDA and has been authorized for detection and/or diagnosis of SARS-CoV-2 by FDA under an Emergency Use Authorization  (EUA).  This EUA will remain in effect (meaning this test can be used) for the duration of the COVID-19 declaration under Section 564(b)(1) of the Act, 21 U.S.C. section 360bbb-3(b)(1), unless the authorization is terminated or revoked sooner. Performed at [facility], [address] [place], [place] [address]   MRSA PCR Screening     Status: None   Collection Time: 10/30/18  5:45 AM   Specimen: Nasopharyngeal  Result Value Ref Range Status   MRSA by PCR NEGATIVE NEGATIVE Final    Comment:        The GeneXpert MRSA Assay (FDA approved for NASAL specimens only), is one component of  a comprehensive MRSA colonization surveillance program. It is not intended to diagnose MRSA infection nor to guide or monitor treatment for MRSA infections. Performed at [facility], [address] [place], [place] [address]     Coagulation Studies: No results for input(s): LABPROT, INR in the last 72 hours.  Urinalysis: No results for input(s): COLORURINE, LABSPEC, PHURINE, GLUCOSEU, HGBUR, BILIRUBINUR, KETONESUR, PROTEINUR, UROBILINOGEN, NITRITE, LEUKOCYTESUR in the last 72 hours.  Invalid input(s): APPERANCEUR    Imaging: No results found.   Medications:   . magnesium sulfate bolus IVPB Stopped (11/03/18 1222)   . amiodarone  200 mg Oral BID  . artificial tears   Both Eyes BID  . aspirin  81 mg Oral Daily  . atorvastatin  80 mg Oral Daily  . calcium acetate  1,334 mg Oral TID WC  . carvedilol  3.125 mg Oral QHS  . Chlorhexidine Gluconate Cloth  6 each Topical Q0600  . cinacalcet  90 mg Oral Q breakfast  . cycloSPORINE  1 drop Both Eyes BID  . docusate sodium  100 mg Oral Daily  . dorzolamide-timolol  1 drop Both Eyes BID  . feeding supplement (NEPRO CARB STEADY)  237 mL Oral BID BM  . latanoprost  1 drop Both Eyes QHS  . loratadine  10 mg Oral Daily  . midodrine  10 mg Oral Once  . mirtazapine  15 mg Oral QHS  . multivitamin  1 tablet Oral QHS  .  pantoprazole  40 mg Oral Daily  . polyvinyl alcohol  1 drop Both Eyes Q4H while awake  . sertraline  150 mg Oral QHS  . sevelamer carbonate  0.8 g Oral TID WC  . sodium chloride flush  3 mL Intravenous Q12H  . ticagrelor  90 mg Oral BID   acetaminophen, LORazepam, Melatonin, methocarbamol, midodrine, morphine injection, nitroGLYCERIN, ondansetron (ZOFRAN) IV, oxyCODONE, senna-docusate  Assessment/ Plan:  63 y.o. male with ESRD, dialysis for almost 30 years, during the patient, recent cervical decompression, autoimmune skin disease, STEMI s/p PCI 2 DES to LAD on 10/30/2018 and 10/24/2018 is admitted for following problems:  Principal Problem:   STEMI (ST elevation myocardial infarction) ([place]) Active Problems:   STEMI involving left anterior descending coronary artery (HCC)   Chest pain of uncertain etiology   Ischemic cardiomyopathy  [facility] [place]/88 kg/ MWF/ 3.45 min. Left arm AVF  #. ESRD with mild hyperkalemia Patient with recent STEMI s/p PCI, stent placement 10/26, 10/28, 2020 -Patient completed dialysis yesterday.  Tolerated well.  No indication for dialysis today.  Will resume normal outpatient dialysis at the [facility] tomorrow.  #. Anemia of CKD  Lab Results  Component Value Date   HGB 12.9 (L) 11/06/2018  Continue to hold Epogen at this time as most recent hemoglobin was 12.9.  #. SHPTH  No results found for: PTH Lab Results  Component Value Date   PHOS 2.9 11/04/2018  Bone mineral metabolism parameters under good control.  Continue to monitor phosphorus, PTH, calcium as an outpatient.  # STEMI Underwent emergent PCI 10/26.  Severe multivessel coronary disease with culprit lesion involving proximal and mid LAD treated with 2 drug-eluting stents.   Repeat heart cath on 10/28 showed right heart pressures were severely elevated. Another stent was placed at that time.     LOS: 8 Mar Walmer 11/3/20202:20 PM  [place],  [place]

## 2018-11-07 NOTE — Plan of Care (Signed)
  Problem: Pain Managment: Goal: General experience of comfort will improve Outcome: Progressing   

## 2018-11-07 NOTE — Progress Notes (Addendum)
William Carpenter to be D/C'd Home per MD order.  Discussed prescriptions and follow up appointments with the patient. Prescriptions electronically submitted, medication list explained in detail. Vascular instructions gone over with patient. Pt verbalized understanding. Brilinta coupon given to patient. Mynx brochure given to patient. Per patient no medications where sent to pharmacy, pharmacy also verified that they had no medications  Allergies as of 11/07/2018      Reactions   Albumin (human) Hives   Aloe    Erythromycin Hives   Iodinated Diagnostic Agents Hives   Iron Dextran    Metoprolol Tartrate    Penicillins Swelling   Quinine Sulfate [quinine]    Rabeprazole    Sulfa Antibiotics Other (See Comments)   Tape Other (See Comments)   Removes skin.   Trimethoprim       Medication List    STOP taking these medications   amLODipine 5 MG tablet Commonly known as: NORVASC   losartan 50 MG tablet Commonly known as: COZAAR     TAKE these medications   acetaminophen 325 MG tablet Commonly known as: TYLENOL Take 650 mg by mouth every 8 (eight) hours as needed (pain or fever).   amiodarone 400 MG tablet Commonly known as: Pacerone Take 1 tablet (400 mg total) by mouth 2 (two) times daily for 5 days. Notes to patient: Take 2nd dose this afternoon   amiodarone 200 MG tablet Commonly known as: Pacerone Take 1 tablet (200 mg total) by mouth 2 (two) times daily. Start taking on: November 12, 2018   aspirin 81 MG chewable tablet Chew 1 tablet (81 mg total) by mouth daily.   atorvastatin 40 MG tablet Commonly known as: LIPITOR Take 20 mg by mouth daily.   b complex-vitamin c-folic acid 0.8 MG Tabs tablet Take 1 tablet by mouth at bedtime.   betamethasone dipropionate 0.05 % ointment Commonly known as: DIPROLENE Apply topically 2 (two) times daily.   bisacodyl 10 MG suppository Commonly known as: DULCOLAX Place 10 mg rectally daily as needed for moderate constipation.    calcium acetate 667 MG capsule Commonly known as: PHOSLO Take 1,334 mg by mouth 3 (three) times daily with meals. Notes to patient: Take 2nd dose with lunch and 3rd dose with dinner   carboxymethylcellulose 0.5 % Soln Commonly known as: REFRESH PLUS 1 drop every 2 (two) hours while awake.   carvedilol 3.125 MG tablet Commonly known as: COREG Take 1 tablet (3.125 mg total) by mouth at bedtime. What changed:   medication strength  how much to take  when to take this   cetirizine 10 MG tablet Commonly known as: ZYRTEC Take 10 mg by mouth daily.   cinacalcet 90 MG tablet Commonly known as: SENSIPAR Take 90 mg by mouth daily.   clobetasol ointment 0.05 % Commonly known as: TEMOVATE Apply 1 application topically 2 (two) times daily.   cycloSPORINE 0.05 % ophthalmic emulsion Commonly known as: RESTASIS Place 1 drop into both eyes 2 (two) times daily. Notes to patient: Take 2nd dose this afternoon   docusate sodium 100 MG capsule Commonly known as: COLACE Take 100 mg by mouth daily.   dorzolamidel-timolol 22.3-6.8 MG/ML Soln ophthalmic solution Commonly known as: COSOPT 1 drop 2 (two) times daily. Notes to patient: Take 2nd dose this afternoon   hydrALAZINE 10 MG tablet Commonly known as: APRESOLINE Take 10 mg by mouth every 4 (four) hours as needed (systolic BP >[id number]).   hydrOXYzine 25 MG capsule Commonly known as: VISTARIL Take 25 mg  by mouth 3 (three) times daily as needed.   ketoconazole 2 % shampoo Commonly known as: NIZORAL Apply 1 application topically 2 (two) times a week.   KONSYL-D PO Take 5 mLs by mouth 2 (two) times daily.   lactulose 10 GM/15ML solution Commonly known as: CHRONULAC Take by mouth 3 (three) times daily.   latanoprost 0.005 % ophthalmic solution Commonly known as: XALATAN Place 1 drop into both eyes at bedtime.   Melatonin 5 MG Caps Take by mouth.   methocarbamol 500 MG tablet Commonly known as: ROBAXIN Take 500 mg by  mouth 2 (two) times daily.   midodrine 5 MG tablet Commonly known as: PROAMATINE Take 1 tablet (5 mg total) by mouth 3 (three) times daily as needed (for BP < 812 systolic).   mirtazapine 15 MG tablet Commonly known as: REMERON Take 15 mg by mouth at bedtime.   mupirocin cream 2 % Commonly known as: BACTROBAN Apply 1 application topically 2 (two) times daily.   naloxone 4 MG/0.1ML Liqd nasal spray kit Commonly known as: NARCAN Place 1 spray into the nose.   omeprazole 20 MG capsule Commonly known as: PRILOSEC Take 20 mg by mouth 2 (two) times daily before a meal. Notes to patient: Take 2nd dose this afternoon   oxycodone 5 MG capsule Commonly known as: OXY-IR Take 5-10 mg by mouth every 4 (four) hours as needed for pain.   polyvinyl alcohol 1.4 % ophthalmic solution Commonly known as: LIQUIFILM TEARS Place 1 drop into both eyes 4 (four) times daily as needed for dry eyes. Notes to patient: Take two more times today.    REFRESH OPTIVE SENSITIVE OP Place 1 drop into both eyes every 4 (four) hours.   senna-docusate 8.6-50 MG tablet Commonly known as: Senokot-S Take 1 tablet by mouth 2 (two) times daily as needed (to prevent constipation; scheduled until bowel movement).   sertraline 50 MG tablet Commonly known as: ZOLOFT Take 150 mg by mouth at bedtime.   sevelamer carbonate 0.8 g Pack packet Commonly known as: RENVELA Take 0.8 g by mouth 3 (three) times daily with meals. Notes to patient: Take 2nd dose with lunch and 3rd with dinner   terbinafine 1 % cream Commonly known as: LAMISIL Apply 1 application topically 2 (two) times daily.   ticagrelor 90 MG Tabs tablet Commonly known as: BRILINTA Take 1 tablet (90 mg total) by mouth 2 (two) times daily. Notes to patient: Take 2nd dose this afternoon       Vitals:   11/07/18 0524 11/07/18 0727  BP: 113/73 105/75  Pulse: (!) 116 62  Resp: 18 19  Temp: 98.2 F (36.8 C) 98.4 F (36.9 C)  SpO2: 93% 96%     Skin clean, dry and intact without evidence of skin break down, no evidence of skin tears noted. IV catheter discontinued intact. Site without signs and symptoms of complications. Dressing and pressure applied. Pt denies pain at this time. No complaints noted. Dressing to right groin dry and intact, no bleeding  An After Visit Summary was printed and given to the patient. Patient waiting on ride to be discharged home [place]

## 2018-11-07 NOTE — Clinical Social Work Note (Signed)
Transition of Care [facility]) - CM/SW Discharge Note   Patient Details  Name: MARLON SULEIMAN MRN: [id number] Date of Birth: 04/20/1955  Transition of Care [facility]) CM/SW Contact:  Ross Ludwig, LCSW Phone Number: 11/07/2018, 5:01 PM   Clinical Narrative:     Patient will be discharging back home with home health through [facility].  CSW contacted [facility] to let them know that patient is discharging today.  Patient will have [place] and nursing.  Final next level of care: [place] Barriers to Discharge: Barriers Resolved   Patient Goals and CMS Choice Patient states their goals for this hospitalization and ongoing recovery are:: To return home with home health CMS Medicare.gov Compare Post Acute Care list provided to:: Patient Choice offered to / list presented to : Patient  Discharge Placement  Patient discharged back home.                     Discharge Plan and Services     Post Acute Care Choice: Home Health          DME Arranged: N/A DME Agency: NA       HH Arranged: RN, PT HH Agency: [place] Date HH Agency Contacted: 11/06/18 Time HH Agency Contacted: 1430 Representative spoke with at [place]: [place] ([place]) Interventions     Readmission Risk Interventions Readmission Risk Prevention Plan 11/05/2018  Transportation Screening Complete  HRI or [place] Complete  Social Work Consult for [place] Planning/Counseling Complete  Palliative Care Screening Not Applicable  Medication Review Press photographer) Complete  Some recent data might be hidden

## 2018-11-07 NOTE — Progress Notes (Signed)
Patient remains in the hospital an additional night to complete his amiodarone load. Stable for discharge this morning.

## 2018-11-07 NOTE — Progress Notes (Signed)
Cardiovascular and Pulmonary Nurse Navigator Note:    Rounded on patient.  Originally rounded on patient on 11/03/2018 when patient was in the ICU.  Patient for discharged home with home health services of RN and PT.    Re-discussed with patient referral to Cardiac Rehab.  Explained to patient in-home physical therapy would need to be completed first prior to starting outpatient Cardiac Rehab. Again, this RN provided an overview of the Cardiac Rehab program.  Patient has declined participation in the onsite Cardiac Rehab program, as patient states his daughter would not be able to bring him since she is caring for both him and his wife.  (Note:  [facility] [place] provides patient transportation to and from [facility] for Hemodialysis.)  This RN then explained the outpatient virtual Cardiac Rehab program.  This program is where the Exercise Physiologist would touch base with you over the phone.  There is no charge for this program.  Patient informed this RN that he might be interested.  Patient agreed to the Cardiac Rehab dept contacting him in approximately 4 weeks to discuss Virtual Cardiac Rehab.    Patient appreciative of the above information.    Roanna Epley, RN, BSN, [facility]  Cardiovascular & Pulmonary Nurse Navigator  Direct Line: [phone]  Department Phone #: [phone] Fax: [phone]  Email Address: [email or web address]

## 2018-11-11 DIAGNOSIS — I120 Hypertensive chronic kidney disease with stage 5 chronic kidney disease or end stage renal disease: Secondary | ICD-10-CM | POA: Diagnosis not present

## 2018-11-11 DIAGNOSIS — N041 Nephrotic syndrome with focal and segmental glomerular lesions: Secondary | ICD-10-CM | POA: Diagnosis not present

## 2018-11-11 DIAGNOSIS — C649 Malignant neoplasm of unspecified kidney, except renal pelvis: Secondary | ICD-10-CM | POA: Diagnosis not present

## 2018-11-11 DIAGNOSIS — I213 ST elevation (STEMI) myocardial infarction of unspecified site: Secondary | ICD-10-CM | POA: Diagnosis not present

## 2018-11-11 DIAGNOSIS — I480 Paroxysmal atrial fibrillation: Secondary | ICD-10-CM | POA: Diagnosis not present

## 2018-11-11 DIAGNOSIS — Z48812 Encounter for surgical aftercare following surgery on the circulatory system: Secondary | ICD-10-CM | POA: Diagnosis not present

## 2018-11-11 DIAGNOSIS — R1310 Dysphagia, unspecified: Secondary | ICD-10-CM | POA: Diagnosis not present

## 2018-11-11 DIAGNOSIS — N186 End stage renal disease: Secondary | ICD-10-CM | POA: Diagnosis not present

## 2018-11-11 DIAGNOSIS — G894 Chronic pain syndrome: Secondary | ICD-10-CM | POA: Diagnosis not present

## 2018-11-11 DIAGNOSIS — E785 Hyperlipidemia, unspecified: Secondary | ICD-10-CM | POA: Diagnosis not present

## 2018-11-11 DIAGNOSIS — Z992 Dependence on renal dialysis: Secondary | ICD-10-CM | POA: Diagnosis not present

## 2018-11-13 DIAGNOSIS — I213 ST elevation (STEMI) myocardial infarction of unspecified site: Secondary | ICD-10-CM | POA: Diagnosis not present

## 2018-11-13 DIAGNOSIS — Z48812 Encounter for surgical aftercare following surgery on the circulatory system: Secondary | ICD-10-CM | POA: Diagnosis not present

## 2018-11-13 DIAGNOSIS — N186 End stage renal disease: Secondary | ICD-10-CM | POA: Diagnosis not present

## 2018-11-13 DIAGNOSIS — C649 Malignant neoplasm of unspecified kidney, except renal pelvis: Secondary | ICD-10-CM | POA: Diagnosis not present

## 2018-11-13 DIAGNOSIS — I120 Hypertensive chronic kidney disease with stage 5 chronic kidney disease or end stage renal disease: Secondary | ICD-10-CM | POA: Diagnosis not present

## 2018-11-13 DIAGNOSIS — N041 Nephrotic syndrome with focal and segmental glomerular lesions: Secondary | ICD-10-CM | POA: Diagnosis not present

## 2018-11-15 ENCOUNTER — Ambulatory Visit: Payer: Medicare Other | Admitting: Internal Medicine

## 2018-11-18 DIAGNOSIS — I213 ST elevation (STEMI) myocardial infarction of unspecified site: Secondary | ICD-10-CM | POA: Diagnosis not present

## 2018-11-18 DIAGNOSIS — C649 Malignant neoplasm of unspecified kidney, except renal pelvis: Secondary | ICD-10-CM | POA: Diagnosis not present

## 2018-11-18 DIAGNOSIS — N041 Nephrotic syndrome with focal and segmental glomerular lesions: Secondary | ICD-10-CM | POA: Diagnosis not present

## 2018-11-18 DIAGNOSIS — I120 Hypertensive chronic kidney disease with stage 5 chronic kidney disease or end stage renal disease: Secondary | ICD-10-CM | POA: Diagnosis not present

## 2018-11-18 DIAGNOSIS — N186 End stage renal disease: Secondary | ICD-10-CM | POA: Diagnosis not present

## 2018-11-18 DIAGNOSIS — Z48812 Encounter for surgical aftercare following surgery on the circulatory system: Secondary | ICD-10-CM | POA: Diagnosis not present

## 2018-11-23 ENCOUNTER — Encounter: Payer: Self-pay | Admitting: Nurse Practitioner

## 2018-11-23 ENCOUNTER — Ambulatory Visit (INDEPENDENT_AMBULATORY_CARE_PROVIDER_SITE_OTHER): Payer: Medicare Other

## 2018-11-23 ENCOUNTER — Other Ambulatory Visit: Payer: Self-pay

## 2018-11-23 ENCOUNTER — Telehealth: Payer: Self-pay | Admitting: Internal Medicine

## 2018-11-23 ENCOUNTER — Ambulatory Visit (INDEPENDENT_AMBULATORY_CARE_PROVIDER_SITE_OTHER): Payer: Medicare Other | Admitting: Nurse Practitioner

## 2018-11-23 VITALS — BP 84/60 | HR 54 | Ht 69.0 in | Wt 195.2 lb

## 2018-11-23 DIAGNOSIS — Z48812 Encounter for surgical aftercare following surgery on the circulatory system: Secondary | ICD-10-CM | POA: Diagnosis not present

## 2018-11-23 DIAGNOSIS — N186 End stage renal disease: Secondary | ICD-10-CM | POA: Diagnosis not present

## 2018-11-23 DIAGNOSIS — I25119 Atherosclerotic heart disease of native coronary artery with unspecified angina pectoris: Secondary | ICD-10-CM | POA: Diagnosis not present

## 2018-11-23 DIAGNOSIS — I120 Hypertensive chronic kidney disease with stage 5 chronic kidney disease or end stage renal disease: Secondary | ICD-10-CM | POA: Diagnosis not present

## 2018-11-23 DIAGNOSIS — I480 Paroxysmal atrial fibrillation: Secondary | ICD-10-CM

## 2018-11-23 DIAGNOSIS — N041 Nephrotic syndrome with focal and segmental glomerular lesions: Secondary | ICD-10-CM | POA: Diagnosis not present

## 2018-11-23 DIAGNOSIS — I213 ST elevation (STEMI) myocardial infarction of unspecified site: Secondary | ICD-10-CM

## 2018-11-23 DIAGNOSIS — E782 Mixed hyperlipidemia: Secondary | ICD-10-CM | POA: Diagnosis not present

## 2018-11-23 DIAGNOSIS — I4891 Unspecified atrial fibrillation: Secondary | ICD-10-CM | POA: Diagnosis not present

## 2018-11-23 DIAGNOSIS — I952 Hypotension due to drugs: Secondary | ICD-10-CM

## 2018-11-23 DIAGNOSIS — C649 Malignant neoplasm of unspecified kidney, except renal pelvis: Secondary | ICD-10-CM | POA: Diagnosis not present

## 2018-11-23 MED ORDER — AMIODARONE HCL 200 MG PO TABS: 200.0000 mg | ORAL_TABLET | Freq: Every day | ORAL | 11 refills | Status: AC

## 2018-11-23 MED ORDER — NITROGLYCERIN 0.4 MG SL SUBL: 0.4000 mg | SUBLINGUAL_TABLET | SUBLINGUAL | 6 refills | Status: AC | PRN

## 2018-11-23 NOTE — Patient Instructions (Signed)
Medication Instructions:  1- As needed Take NTG Place 1 tablet (0.4 mg total) under the tongue every 5 (five) minutes as needed for chest pain. Max 3 tablets. If chest pain is not relieved after 3 tablets seek urgent medical attention.  2- STOP Coreg 3- DECREASE Amiodarone Take 1 tablet (200 mg total) by mouth daily *If you need a refill on your cardiac medications before your next appointment, please call your pharmacy*  Lab Work: None ordered  If you have labs (blood work) drawn today and your tests are completely normal, you will receive your results only by: Marland Kitchen MyChart Message (if you have MyChart) OR . A paper copy in the mail If you have any lab test that is abnormal or we need to change your treatment, we will call you to review the results.  Testing/Procedures: 1- A zio monitor was placed today. It will remain on for 14 days. You will then return monitor and event diary in provided box. It takes 1-2 weeks for report to be downloaded and returned to [place]. We will call you with the results. If monitor falls of or has orange flashing light, please call Zio for further instructions.     Follow-Up: At [facility], you and your health needs are our priority.  As part of our continuing mission to provide you with exceptional heart care, we have created designated Provider Care Teams.  These Care Teams include your primary Cardiologist (physician) and Advanced Practice Providers (APPs -  Physician Assistants and Nurse Practitioners) who all work together to provide you with the care you need, when you need it.  Your next appointment:   1 month(s)  The format for your next appointment:   In Person  Provider:    You may see Nelva Bush, MD or Murray Hodgkins, NP.

## 2018-11-23 NOTE — Progress Notes (Signed)
Office Visit    Patient Name: William Carpenter Date of Encounter: 11/23/2018  Primary Care Provider:  Raelyn Mora, MD Primary Cardiologist:  Nelva Bush, MD  Chief Complaint    63 y/o ? 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  Past Medical History    Past Medical History:  Diagnosis Date   Bullous dermatosis    CAD (coronary artery disease)    a. 10/2018 Ant STEMI/PCI: LM nl, LAD 70p, 25m (3.5x16 Synergy DES and 3.0x28 Synergy DES), 50d, D1 95, D2 90, D3 70, LCX 50ost, OM2 60, lat OM2 90, RCA nl, RPDA 90. EF ~ 35%; b. 10/2018 Staged PCI of 90% RPDA (2.25x15 Resolute Onyx DES).   Dysphagia    ESRD (end stage renal disease) ([place])    FSGS (focal segmental glomerulosclerosis)    Hyperlipidemia    Ischemic cardiomyopathy    a. 10/2018 Echo: EF 50-55%, DD, apical AK, mid to apical anteroseptal AK. Mild to mod LAE. Mild to mod AoV sclerosis w/o stenosis.   PAF (paroxysmal atrial fibrillation) ([place])    a. Noted briefly following MI-->oral amio.   Spinal stenosis, cervical region    Past Surgical History:  Procedure Laterality Date   CARPAL TUNNEL RELEASE     CORONARY STENT INTERVENTION N/A 11/01/2018   Procedure: CORONARY STENT INTERVENTION;  Surgeon: Nelva Bush, MD;  Location: [place] [facility];  Service: Cardiovascular;  Laterality: N/A;   CORONARY/GRAFT ACUTE MI REVASCULARIZATION N/A 10/30/2018   Procedure: Coronary/Graft Acute MI Revascularization;  Surgeon: Nelva Bush, MD;  Location: [place] [facility];  Service: Cardiovascular;  Laterality: N/A;   LEFT HEART CATH AND CORONARY ANGIOGRAPHY N/A 10/30/2018   Procedure: LEFT HEART CATH AND CORONARY ANGIOGRAPHY;  Surgeon: Nelva Bush, MD;  Location: [place] [facility];  Service: Cardiovascular;  Laterality: N/A;   RIGHT/LEFT HEART CATH AND CORONARY ANGIOGRAPHY N/A 11/01/2018   Procedure: RIGHT/LEFT HEART CATH AND CORONARY ANGIOGRAPHY;  Surgeon: Nelva Bush, MD;  Location: [place] [facility];  Service: Cardiovascular;  Laterality: N/A;   SPINAL FUSION  2018   spleen  2015   removed    Allergies  Allergies  Allergen Reactions   Albumin (Human) Hives   Aloe    Erythromycin Hives   Iodinated Diagnostic Agents Hives   Iron Dextran    Metoprolol Tartrate    Penicillins Swelling   Quinine Sulfate [Quinine]    Rabeprazole    Sulfa Antibiotics Other (See Comments)   Tape Other (See Comments)    Removes skin.   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  He presented to [facility] on October 26 with chest pain and abnormal ECG suggestive of anterior myocardial infarction.  He underwent emergent diagnostic cardiac catheterization revealing diffuse multivessel CAD with severe proximal and mid LAD disease, which was felt to be the culprit.  These areas were successfully treated with 2 drug-eluting stents.  He had residual branch diagonal, obtuse marginal, circumflex, and RPDA disease.  Initial plan was made for medical therapy of residual disease however, he continued to have chest pain post PCI.  CTA of the chest, abdomen, and pelvis was performed and  was negative for dissection.  Given ongoing pain, he was taken back to the catheterization laboratory on October 28 and underwent successful PCI and drug-eluting stent placement to the RPDA.  He also developed atrial fibrillation and was placed on IV amiodarone.  During dialysis on the afternoon following PCI of the RPDA, he had wide-complex tachycardia.  By the following morning, he converted to  sinus rhythm and was switched to oral amiodarone, but did have recurrent PAF requiring an additional amiodarone bolus.  He again converted to sinus rhythm and given short duration of A. fib in the setting of MI with dual antiplatelet therapy, decision was made not to start oral anticoagulation.  Of note, there was report of dark stools on one evening during his admission but this subsequently resolved.  He did receive hemodialysis throughout admission.  Since his discharge, the main thing that he and his daughter have noticed is hypotension occurring on a daily basis.  This occurs with on both dialysis and nondialysis days and per his daughter, seems to be worse on nondialysis days.  He takes carvedilol 3.125 mg half a tablet daily since discharge on both dialysis and nondialysis days.  It is fairly common for him to awake with pressures in the 150s but by late morning/early afternoon, pressures are frequently in the 60s to 80s.  In this setting, he does experience lightheadedness.  He occasionally notes chest pain which appears to be mostly positional in nature.  He notes that if he lies on his left side for a while that he may develop some chest discomfort that improves when he moves his arms.  Also, he has had some exertional chest discomfort that improves when he raises his arms above his head.  There is no rhyme or reason to the occurrence of the symptoms though he does think that his heart rates elevate during episodes of discomfort.  Episodes typically last just a few seconds and resolve spontaneously.  Of note, his anginal equivalent was right neck, left shoulder, and left axillary pain.  He has not had anything like that since his discharge.  Home Medications    Prior to Admission medications   Medication Sig Start Date End Date Taking? Authorizing Provider  acetaminophen (TYLENOL) 325 MG tablet Take 650 mg by mouth every 8 (eight) hours as needed (pain or fever).     [provider]    amiodarone (PACERONE) 200 MG tablet Take 1 tablet (200 mg total) by mouth 2 (two) times daily. 11/12/18 12/12/18  Marcell Anger, MD  amiodarone (PACERONE) 400 MG tablet Take 1 tablet (400 mg total) by mouth 2 (two) times daily for 5 days. 11/07/18 11/12/18  Marcell Anger, MD  aspirin 81 MG chewable tablet Chew 1 tablet (81 mg total) by mouth daily. 11/07/18 12/07/18  Marcell Anger, MD  atorvastatin (LIPITOR) 40 MG tablet Take 20 mg by mouth daily.     [provider]  b complex-vitamin c-folic acid (NEPHRO-VITE) 0.8 MG TABS tablet Take 1 tablet by mouth at bedtime.    [provider]  betamethasone dipropionate (DIPROLENE) 0.05 % ointment Apply topically 2 (two) times daily.    [provider]  bisacodyl (DULCOLAX) 10 MG suppository Place 10 mg rectally daily as needed for moderate constipation.    [provider]  calcium acetate (PHOSLO) 667 MG capsule Take 1,334 mg by mouth 3 (three) times daily with meals.     [provider]  Carboxymethylcellul-Glycerin (REFRESH OPTIVE SENSITIVE OP) Place  1 drop into both eyes every 4 (four) hours.    [provider]  carboxymethylcellulose (REFRESH PLUS) 0.5 % SOLN 1 drop every 2 (two) hours while awake.    [provider]  carvedilol (COREG) 3.125 MG tablet Take 1/2 tablet (3.125 mg total) by mouth at bedtime. 11/07/18 12/07/18  Marcell Anger, MD  cetirizine (ZYRTEC) 10 MG tablet Take 10 mg by mouth daily.    [provider]  cinacalcet (SENSIPAR) 90 MG tablet Take 90 mg by mouth daily.    [provider]  clobetasol ointment (TEMOVATE) 2.20 % Apply 1 application topically 2 (two) times daily.    [provider]  cycloSPORINE (RESTASIS) 0.05 % ophthalmic emulsion Place 1 drop into both eyes 2 (two) times daily.    [provider]  docusate sodium (COLACE) 100 MG capsule Take 100 mg by mouth daily.    [provider]   dorzolamidel-timolol (COSOPT) 22.3-6.8 MG/ML SOLN ophthalmic solution 1 drop 2 (two) times daily.    [provider]  hydrOXYzine (VISTARIL) 25 MG capsule Take 25 mg by mouth 3 (three) times daily as needed.    [provider]  ketoconazole (NIZORAL) 2 % shampoo Apply 1 application topically 2 (two) times a week.    [provider]  lactulose (CHRONULAC) 10 GM/15ML solution Take by mouth 3 (three) times daily.    [provider]  latanoprost (XALATAN) 0.005 % ophthalmic solution Place 1 drop into both eyes at bedtime.    [provider]  Melatonin 5 MG CAPS Take by mouth.    [provider]  methocarbamol (ROBAXIN) 500 MG tablet Take 500 mg by mouth 2 (two) times daily.     [provider]  midodrine (PROAMATINE) 5 MG tablet Take 1 tablet (5 mg total) by mouth 3 (three) times daily as needed (for BP < 254 systolic). 11/07/18 12/07/18  Marcell Anger, MD  mirtazapine (REMERON) 15 MG tablet Take 15 mg by mouth at bedtime.    [provider]  mupirocin cream (BACTROBAN) 2 % Apply 1 application topically 2 (two) times daily. Patient not taking: Reported on 10/30/2018 09/30/16   Frederich Cha, MD  naloxone [facility]) nasal spray 4 mg/0.1 mL Place 1 spray into the nose.    [provider]  omeprazole (PRILOSEC) 20 MG capsule Take 20 mg by mouth 2 (two) times daily before a meal.    [provider]  oxycodone (OXY-IR) 5 MG capsule Take 5-10 mg by mouth every 4 (four) hours as needed for pain.     [provider]  polyvinyl alcohol (LIQUIFILM TEARS) 1.4 % ophthalmic solution Place 1 drop into both eyes 4 (four) times daily as needed for dry eyes.    [provider]  Psyllium (KONSYL-D PO) Take 5 mLs by mouth 2 (two) times daily.    [provider]  senna-docusate (SENOKOT-S) 8.6-50 MG tablet Take 1 tablet by mouth 2 (two) times daily as needed (to prevent constipation; scheduled until  bowel movement).     [provider]  sertraline (ZOLOFT) 50 MG tablet Take 150 mg by mouth at bedtime.    [provider]  sevelamer carbonate (RENVELA) 0.8 g PACK packet Take 0.8 g by mouth 3 (three) times daily with meals.    [provider]  terbinafine (LAMISIL) 1 % cream Apply 1 application topically 2 (two) times daily.    [provider]  ticagrelor (BRILINTA) 90 MG TABS tablet Take 1 tablet (  90 mg total) by mouth 2 (two) times daily. 11/07/18   Marcell Anger, MD    Review of Systems    Intermittent palpitations associated with chest discomfort, which is typically brief in nature.  He has been having some lightheadedness in the setting of very low blood pressures.  His appetite has been poor though this appears to be a chronic issue.  He has chronic mild lower extremity swelling.  He denies PND, orthopnea, syncope, or early satiety.  All other systems reviewed and are otherwise negative except as noted above.  Physical Exam    VS:  BP (!) 84/60 (BP Location: Right Arm, Patient Position: Sitting, Cuff Size: Normal)    Pulse (!) 54    Ht 5\' 9"  (1.753 m)    Wt 195 lb 4 oz (88.6 kg)    BMI 28.83 kg/m  , BMI Body mass index is 28.83 kg/m. GEN: Well nourished, well developed, in no acute distress. HEENT: normal. Neck: Supple, no JVD, carotid bruits, or masses. Cardiac: RRR, distant, 2/6 systolic murmur at the upper sternal borders, no murmurs, rubs, or gallops. No clubbing, cyanosis.  Trace bilateral lower extremity edema.  Radials/PT 1+ and equal bilaterally.  Respiratory:  Respirations regular and unlabored, clear to auscultation bilaterally. GI: Soft, nontender, nondistended, BS + x 4. MS: no deformity or atrophy. Skin: warm and dry, no rash. Neuro:  Strength and sensation are intact. Psych: Normal affect.  Accessory Clinical Findings    ECG personally reviewed by me today -sinus bradycardia/sinus arrhythmia, 54, nonspecific T changes,  prolonged QT- no acute changes.  Lab Results  Component Value Date   WBC 6.7 11/06/2018   HGB 12.9 (L) 11/06/2018   HCT 39.2 11/06/2018   MCV 102.6 (H) 11/06/2018   PLT 282 11/06/2018   Lab Results  Component Value Date   CREATININE 7.35 (H) 11/06/2018   BUN 59 (H) 11/06/2018   NA 132 (L) 11/06/2018   K 4.0 11/06/2018   CL 90 (L) 11/06/2018   CO2 27 11/06/2018   Lab Results  Component Value Date   ALT 18 10/30/2018   AST 26 10/30/2018   ALKPHOS 212 (H) 10/30/2018   BILITOT 1.7 (H) 10/30/2018   Lab Results  Component Value Date   CHOL 158 10/31/2018   HDL 44 10/31/2018   LDLCALC 90 10/31/2018   TRIG 119 10/31/2018   CHOLHDL 3.6 10/31/2018     Assessment & Plan    1.  Anterior STEMI, subsequent episode of care/coronary artery disease: Status post recent admission for right neck, left shoulder, and left axillary pain with finding of anterior ST segment elevation.  Diagnostic catheterization revealed severe diffuse multivessel coronary disease with severe LAD disease, which was treated with drug-eluting stents x 2.  With recurrent chest pain, the RPDA was subsequently treated in a staged fashion.  Since his discharge, he has had occasional, brief episodes of chest discomfort in the setting of palpitations and also some positional chest discomfort.  He has not had any symptoms similar to what prompted his ER presentation.  He remains on aspirin, Brilinta, statin.  He is on very low-dose beta-blocker-carvedilol 1.56 mg once a day and this is resulting in significant hypotension late in the morning and throughout the afternoon whether or not he has received dialysis that day.  In that setting, I feel like there is no choice but to discontinue this (blood pressure 84/60 today).  He is currently receiving physical therapy at home but might be interested in  cardiac rehabilitation if his conditioning permits.  He is also followed at the [facility] and ultimately, his daughter would  like all of his care to take place there as he has been seen there for many years.  They have told him they may need to switch from Brilinta to clopidogrel which of course would be acceptable.  He would require clopidogrel 600 mg load at the time of the switch.  2.  Paroxysmal atrial fibrillation: Noted during hospitalization.  He is currently on amiodarone 200 mg twice daily.  He has noted some palpitations with rises in heart rates into the low 100s and this has been associated with chest discomfort.  He is bradycardic today with a prolonged QT of 532 ms.  As above, discontinue carvedilol in the setting of hypotension and I will also reduce his amiodarone to 200 mg a day.  I am going to place a ZIO monitor for 2 weeks to assess for recurrence of A. fib as if it is noted, we may wish to change her antiarrhythmic strategy and this would also cause [place] to initiate oral anticoagulation.  3.  Hypotension/essential hypertension: History of hypertension though has been dealing with daily hypotension as outlined above.  Discontinuing carvedilol today.  His daughter will check his blood pressure daily.  She does use midodrine on an as-needed basis.  4.  Hyperlipidemia: LDL of 90 during hospitalization.  Continue high potency statin therapy.  He will plan to have lipids followed at the [place].  5.  End-stage renal disease: Followed closely by nephrology with hemodialysis 3 times a week.  6.  Disposition: Patient has had follow-up lab work through nephrology in the [place] since his hospital discharge.  Follow-up Zio monitoring.  Follow-up in clinic in 1 month or sooner if necessary.   Murray Hodgkins, NP 11/23/2018, 5:10 PM

## 2018-11-23 NOTE — Telephone Encounter (Signed)
Attempted to schedule fu .  Patient will have to call back when he knows what HD schedule will be.

## 2018-11-27 DIAGNOSIS — N041 Nephrotic syndrome with focal and segmental glomerular lesions: Secondary | ICD-10-CM | POA: Diagnosis not present

## 2018-11-27 DIAGNOSIS — I120 Hypertensive chronic kidney disease with stage 5 chronic kidney disease or end stage renal disease: Secondary | ICD-10-CM | POA: Diagnosis not present

## 2018-11-27 DIAGNOSIS — I213 ST elevation (STEMI) myocardial infarction of unspecified site: Secondary | ICD-10-CM | POA: Diagnosis not present

## 2018-11-27 DIAGNOSIS — N186 End stage renal disease: Secondary | ICD-10-CM | POA: Diagnosis not present

## 2018-11-27 DIAGNOSIS — C649 Malignant neoplasm of unspecified kidney, except renal pelvis: Secondary | ICD-10-CM | POA: Diagnosis not present

## 2018-11-27 DIAGNOSIS — Z48812 Encounter for surgical aftercare following surgery on the circulatory system: Secondary | ICD-10-CM | POA: Diagnosis not present

## 2018-12-05 DIAGNOSIS — C649 Malignant neoplasm of unspecified kidney, except renal pelvis: Secondary | ICD-10-CM | POA: Diagnosis not present

## 2018-12-05 DIAGNOSIS — N186 End stage renal disease: Secondary | ICD-10-CM | POA: Diagnosis not present

## 2018-12-05 DIAGNOSIS — I120 Hypertensive chronic kidney disease with stage 5 chronic kidney disease or end stage renal disease: Secondary | ICD-10-CM | POA: Diagnosis not present

## 2018-12-05 DIAGNOSIS — I213 ST elevation (STEMI) myocardial infarction of unspecified site: Secondary | ICD-10-CM | POA: Diagnosis not present

## 2018-12-05 DIAGNOSIS — Z48812 Encounter for surgical aftercare following surgery on the circulatory system: Secondary | ICD-10-CM | POA: Diagnosis not present

## 2018-12-05 DIAGNOSIS — N041 Nephrotic syndrome with focal and segmental glomerular lesions: Secondary | ICD-10-CM | POA: Diagnosis not present

## 2019-01-02 ENCOUNTER — Telehealth: Payer: Self-pay

## 2019-01-02 NOTE — Telephone Encounter (Signed)
-----   Message from Theora Gianotti, NP sent at 01/02/2019  9:17 AM EST ----- Monitoring did show some atrial fibrillation, ~ 1% of total beats.  Symptoms were associated with extra beats from the top and bottom portions of the heart, but not afib.  I discussed his case with Dr. Saunders Revel.   We would normally add a stronger blood thinner, in addition to brilinta, however he is at fairly high bleeding risk since he is already on brilinta,  a dialysis patient, and has had  low blood pressure, increasing his risk of falls.  Our recommendation at this  time is to continue asa 81mg   daily (please  ensure that he is  taking this) and brilinta 90mg  twice  daily.  Please also check to see that he has established care at the [facility],  [place]  he will need to f/u with [place] in January.

## 2019-01-02 NOTE — Telephone Encounter (Signed)
Call to patient and discussed results and POC following monitor.   Pt verbalized understanding and confirmed that he is taking medications as ordered.   Advised pt to call for any further questions or concerns.

## 2019-01-31 NOTE — Telephone Encounter (Signed)
l mom to schedule f/u 

## 2019-02-02 NOTE — Telephone Encounter (Signed)
Patient declined . [facility] at [facility] .    Closing encounter

## 2019-06-06 DIAGNOSIS — I959 Hypotension, unspecified: Secondary | ICD-10-CM | POA: Diagnosis not present

## 2019-06-06 DIAGNOSIS — R52 Pain, unspecified: Secondary | ICD-10-CM | POA: Diagnosis not present

## 2019-06-06 DIAGNOSIS — R0902 Hypoxemia: Secondary | ICD-10-CM | POA: Diagnosis not present

## 2019-06-06 DIAGNOSIS — I499 Cardiac arrhythmia, unspecified: Secondary | ICD-10-CM | POA: Diagnosis not present

## 2019-06-07 ENCOUNTER — Emergency Department: Payer: Medicare Other

## 2019-06-07 ENCOUNTER — Emergency Department
Admission: EM | Admit: 2019-06-07 | Discharge: 2019-06-07 | Disposition: A | Discharge status: Home / Self Care | Payer: Medicare Other | Attending: Emergency Medicine | Admitting: Emergency Medicine

## 2019-06-07 DIAGNOSIS — I252 Old myocardial infarction: Secondary | ICD-10-CM | POA: Diagnosis not present

## 2019-06-07 DIAGNOSIS — R0602 Shortness of breath: Secondary | ICD-10-CM | POA: Diagnosis not present

## 2019-06-07 DIAGNOSIS — Z20822 Contact with and (suspected) exposure to covid-19: Secondary | ICD-10-CM | POA: Insufficient documentation

## 2019-06-07 DIAGNOSIS — I120 Hypertensive chronic kidney disease with stage 5 chronic kidney disease or end stage renal disease: Secondary | ICD-10-CM | POA: Diagnosis not present

## 2019-06-07 DIAGNOSIS — I959 Hypotension, unspecified: Secondary | ICD-10-CM | POA: Diagnosis not present

## 2019-06-07 DIAGNOSIS — Z79899 Other long term (current) drug therapy: Secondary | ICD-10-CM | POA: Diagnosis not present

## 2019-06-07 DIAGNOSIS — Z992 Dependence on renal dialysis: Secondary | ICD-10-CM | POA: Insufficient documentation

## 2019-06-07 DIAGNOSIS — N186 End stage renal disease: Secondary | ICD-10-CM | POA: Diagnosis not present

## 2019-06-07 LAB — BRAIN NATRIURETIC PEPTIDE: B Natriuretic Peptide: 83.1 pg/mL (ref 0.0–100.0)

## 2019-06-07 LAB — COMPREHENSIVE METABOLIC PANEL
ALT: 20 U/L (ref 0–44)
AST: 28 U/L (ref 15–41)
Albumin: 3.5 g/dL (ref 3.5–5.0)
Alkaline Phosphatase: 183 U/L — ABNORMAL HIGH (ref 38–126)
Anion gap: 11 (ref 5–15)
BUN: 30 mg/dL — ABNORMAL HIGH (ref 8–23)
CO2: 29 mmol/L (ref 22–32)
Calcium: 8.6 mg/dL — ABNORMAL LOW (ref 8.9–10.3)
Chloride: 92 mmol/L — ABNORMAL LOW (ref 98–111)
Creatinine, Ser: 5.12 mg/dL — ABNORMAL HIGH (ref 0.61–1.24)
GFR calc Af Amer: 13 mL/min — ABNORMAL LOW (ref >60)
GFR calc non Af Amer: 11 mL/min — ABNORMAL LOW (ref >60)
Glucose, Bld: 111 mg/dL — ABNORMAL HIGH (ref 70–99)
Potassium: 4 mmol/L (ref 3.5–5.1)
Sodium: 132 mmol/L — ABNORMAL LOW (ref 135–145)
Total Bilirubin: 0.8 mg/dL (ref 0.3–1.2)
Total Protein: 8.6 g/dL — ABNORMAL HIGH (ref 6.5–8.1)

## 2019-06-07 LAB — CBC WITH DIFFERENTIAL/PLATELET
Abs Immature Granulocytes: 0.03 10*3/uL (ref 0.00–0.07)
Basophils Absolute: 0.1 10*3/uL (ref 0.0–0.1)
Basophils Relative: 1 %
Eosinophils Absolute: 0.4 10*3/uL (ref 0.0–0.5)
Eosinophils Relative: 4 %
HCT: 40.9 % (ref 39.0–52.0)
Hemoglobin: 13.2 g/dL (ref 13.0–17.0)
Immature Granulocytes: 0 %
Lymphocytes Relative: 33 %
Lymphs Abs: 3.8 10*3/uL (ref 0.7–4.0)
MCH: 33.8 pg (ref 26.0–34.0)
MCHC: 32.3 g/dL (ref 30.0–36.0)
MCV: 104.6 fL — ABNORMAL HIGH (ref 80.0–100.0)
Monocytes Absolute: 1.5 10*3/uL — ABNORMAL HIGH (ref 0.1–1.0)
Monocytes Relative: 13 %
Neutro Abs: 5.6 10*3/uL (ref 1.7–7.7)
Neutrophils Relative %: 49 %
Platelets: 237 10*3/uL (ref 150–400)
RBC: 3.91 MIL/uL — ABNORMAL LOW (ref 4.22–5.81)
RDW: 14.6 % (ref 11.5–15.5)
WBC: 11.4 10*3/uL — ABNORMAL HIGH (ref 4.0–10.5)
nRBC: 0 % (ref 0.0–0.2)

## 2019-06-07 LAB — LIPASE, BLOOD: Lipase: 49 U/L (ref 11–51)

## 2019-06-07 LAB — PROCALCITONIN: Procalcitonin: 0.64 ng/mL

## 2019-06-07 LAB — SARS CORONAVIRUS 2 BY RT PCR (HOSPITAL ORDER, PERFORMED IN ~~LOC~~ HOSPITAL LAB): SARS Coronavirus 2: NEGATIVE

## 2019-06-07 LAB — PROTIME-INR
INR: 1.1 (ref 0.8–1.2)
Prothrombin Time: 13.3 seconds (ref 11.4–15.2)

## 2019-06-07 LAB — APTT: aPTT: 32 seconds (ref 24–36)

## 2019-06-07 LAB — LACTIC ACID, PLASMA: Lactic Acid, Venous: 2.1 mmol/L (ref 0.5–1.9)

## 2019-06-07 MED ORDER — SODIUM CHLORIDE 0.9 % IV BOLUS
500.0000 mL | Freq: Once | INTRAVENOUS | Status: AC
  Administered 2019-06-07: 500 mL via INTRAVENOUS

## 2019-06-07 NOTE — ED Provider Notes (Signed)
[facility] Emergency Department Provider Note  ____________________________________________   First MD Initiated Contact with Patient 06/07/19 0009     (approximate)  I have reviewed the triage vital signs and the nursing notes.   HISTORY  Chief Complaint Hypotension  Level 5 caveat: History is somewhat limited by the patient being a vague historian although he is able to provide some information.  His daughter/power of attorney provided additional history.  HPI William Carpenter is a 64 y.o. male with medical history as listed below which notably includes end-stage renal disease on hemodialysis Mondays, Wednesdays, and Fridays, and a history of hypertension which is treated with midodrine 10 mg 3 times daily as needed for low blood pressure.  His daughter says that they are used to systolic blood pressures in the 70s and 80s, but tonight his blood pressure was considerably lower than usual.  He had a relatively busy day yesterday with a full course of dialysis at the [facility] and then he went to the [facility] emergency department and had an abscess drained on his back.  By the time he got home it has been many hours away and he was very tired.  His blood pressure tonight was low and his daughter reports that it was as low as systolic in the 60F.  He says that he is tired but has no other symptoms.  He denies fever, sore throat, chest pain, shortness of breath, nausea, vomiting, and abdominal pain.  He does not produce urine and he has no trouble lying flat at this time.  For EMS his initial blood pressure was 52/31, but his initial blood pressure in the emergency department was 90/68.  His daughter reports that this is severe for him and nothing in particular seems to make the symptoms better or worse.  He gets all of his care at the [place].         Past Medical History:  Diagnosis Date  . Bullous dermatosis   . CAD (coronary artery disease)    a. 10/2018  Ant STEMI/PCI: LM nl, LAD 70p, 63m (3.5x16 Synergy DES and 3.0x28 Synergy DES), 50d, D1 95, D2 90, D3 70, LCX 50ost, OM2 60, lat OM2 90, RCA nl, RPDA 90. EF ~ 35%; b. 10/2018 Staged PCI of 90% RPDA (2.25x15 Resolute Onyx DES).  Marland Kitchen Dysphagia   . ESRD (end stage renal disease) ([place])   . FSGS (focal segmental glomerulosclerosis)   . Hyperlipidemia   . Ischemic cardiomyopathy    a. 10/2018 Echo: EF 50-55%, DD, apical AK, mid to apical anteroseptal AK. Mild to mod LAE. Mild to mod AoV sclerosis w/o stenosis.  Marland Kitchen PAF (paroxysmal atrial fibrillation) ([place])    a. Noted briefly following MI-->oral amio.  Marland Kitchen Spinal stenosis, cervical region     Patient Active Problem List   Diagnosis Date Noted  . Chest pain of uncertain etiology   . Ischemic cardiomyopathy   . STEMI (ST elevation myocardial infarction) ([place]) 10/30/2018  . STEMI involving left anterior descending coronary artery ([place]) 10/30/2018  . Pressure injury of skin 11/23/2017  . ESRD on dialysis ([place]) 11/22/2017  . HLD (hyperlipidemia) 11/22/2017  . Intramuscular hematoma 11/22/2017  . Acalculous cholecystitis   . Leukocytosis 07/12/2016  . Hypotension 07/12/2016    Past Surgical History:  Procedure Laterality Date  . CARPAL TUNNEL RELEASE    . CORONARY STENT INTERVENTION N/A 11/01/2018   Procedure: CORONARY STENT INTERVENTION;  Surgeon: Nelva Bush, MD;  Location: [place] [facility];  Service: Cardiovascular;  Laterality: N/A;  . CORONARY/GRAFT ACUTE MI REVASCULARIZATION N/A 10/30/2018   Procedure: Coronary/Graft Acute MI Revascularization;  Surgeon: Nelva Bush, MD;  Location: [place] [facility];  Service: Cardiovascular;  Laterality: N/A;  . LEFT HEART CATH AND CORONARY ANGIOGRAPHY N/A 10/30/2018   Procedure: LEFT HEART CATH AND CORONARY ANGIOGRAPHY;  Surgeon: Nelva Bush, MD;  Location: [place] [facility];  Service: Cardiovascular;  Laterality: N/A;  . RIGHT/LEFT HEART CATH AND CORONARY ANGIOGRAPHY N/A  11/01/2018   Procedure: RIGHT/LEFT HEART CATH AND CORONARY ANGIOGRAPHY;  Surgeon: Nelva Bush, MD;  Location: [place] [facility];  Service: Cardiovascular;  Laterality: N/A;  . SPINAL FUSION  2018  . spleen  2015   removed    Prior to Admission medications   Medication Sig Start Date End Date Taking? Authorizing Provider  acetaminophen (TYLENOL) 325 MG tablet Take 650 mg by mouth every 8 (eight) hours as needed (pain or fever).     [provider]  amiodarone (PACERONE) 200 MG tablet Take 1 tablet (200 mg total) by mouth daily. 11/23/18 12/23/18  Theora Gianotti, NP  atorvastatin (LIPITOR) 40 MG tablet Take 20 mg by mouth daily.     [provider]  b complex-vitamin c-folic acid (NEPHRO-VITE) 0.8 MG TABS tablet Take 1 tablet by mouth at bedtime.    [provider]  betamethasone dipropionate (DIPROLENE) 0.05 % ointment Apply topically 2 (two) times daily.    [provider]  calcium acetate (PHOSLO) 667 MG capsule Take 1,334 mg by mouth 3 (three) times daily with meals. ON HOLD    [provider]  carboxymethylcellulose (REFRESH PLUS) 0.5 % SOLN 1 drop every 2 (two) hours while awake.    [provider]  cetirizine (ZYRTEC) 10 MG tablet Take 10 mg by mouth daily.    [provider]  cinacalcet (SENSIPAR) 90 MG tablet Take 90 mg by mouth daily. ON HOLD    [provider]  clobetasol ointment (TEMOVATE) 4.74 % Apply 1 application topically 2 (two) times daily.    [provider]  cycloSPORINE (RESTASIS) 0.05 % ophthalmic emulsion Place 1 drop into both eyes 2 (two) times daily.    [provider]  docusate sodium (COLACE) 100 MG capsule Take 100 mg by mouth daily.    [provider]  dorzolamidel-timolol (COSOPT) 22.3-6.8 MG/ML SOLN ophthalmic solution 1 drop 2 (two) times daily.    [provider]  hydrALAZINE (APRESOLINE) 10 MG tablet Take 10 mg by mouth every 4 (four)  hours as needed (systolic BP >259DGLO).     [provider]  hydrOXYzine (VISTARIL) 25 MG capsule Take 25 mg by mouth 3 (three) times daily as needed.    [provider]  ketoconazole (NIZORAL) 2 % shampoo Apply 1 application topically 2 (two) times a week.    [provider]  lactulose (CHRONULAC) 10 GM/15ML solution Take by mouth 3 (three) times daily.    [provider]  latanoprost (XALATAN) 0.005 % ophthalmic solution Place 1 drop into both eyes at bedtime.    [provider]  Melatonin 5 MG CAPS Take by mouth at bedtime.     [provider]  mirtazapine (REMERON) 15 MG tablet Take 15 mg by mouth at bedtime.    [provider]  mupirocin cream (BACTROBAN) 2 % Apply 1 application topically 2 (two) times daily. 09/30/16   Frederich Cha, MD  naloxone [facility]) nasal spray 4 mg/0.1 mL Place 1 spray  into the nose.    [provider]  nitroGLYCERIN (NITROSTAT) 0.4 MG SL tablet Place 1 tablet (0.4 mg total) under the tongue every 5 (five) minutes as needed for chest pain. 11/23/18 02/21/19  Theora Gianotti, NP  omeprazole (PRILOSEC) 20 MG capsule Take 20 mg by mouth 2 (two) times daily before a meal.    [provider]  oxycodone (OXY-IR) 5 MG capsule Take 5-10 mg by mouth every 4 (four) hours as needed for pain.     [provider]  polyvinyl alcohol (LIQUIFILM TEARS) 1.4 % ophthalmic solution Place 1 drop into both eyes 4 (four) times daily as needed for dry eyes.    [provider]  senna-docusate (SENOKOT-S) 8.6-50 MG tablet Take 1 tablet by mouth 2 (two) times daily as needed (to prevent constipation; scheduled until bowel movement).     [provider]  sertraline (ZOLOFT) 50 MG tablet Take 150 mg by mouth at bedtime.    [provider]  ticagrelor (BRILINTA) 90 MG TABS tablet Take 1 tablet (90 mg total) by mouth 2 (two) times daily. 11/07/18   Spongberg, Audie Pinto, MD     Allergies Albumin (human), Aloe, Erythromycin, Iodinated diagnostic agents, Iron dextran, Metoprolol tartrate, Penicillins, Quinine sulfate [quinine], Rabeprazole, Sulfa antibiotics, Tape, and Trimethoprim  Family History  Problem Relation Age of Onset  . Cancer Mother   . COPD Mother   . Heart disease Father   . Parkinson's disease Father     Social History Social History   Tobacco Use  . Smoking status: Never Smoker  . Smokeless tobacco: Never Used  Substance Use Topics  . Alcohol use: No  . Drug use: No    Review of Systems Level 5 caveat: History is somewhat limited by the patient being a vague historian although he is able to provide some information.  His daughter/power of attorney provided additional history.  Constitutional: Acute on chronic hypotension.  No fever/chills Eyes: No visual changes. ENT: No sore throat. Cardiovascular: Denies chest pain. Respiratory: Denies shortness of breath. Gastrointestinal: No abdominal pain.  No nausea, no vomiting.  No diarrhea.  No constipation. Genitourinary: End-stage renal disease on hemodialysis. Musculoskeletal: Negative for neck pain.  Negative for back pain. Integumentary: Negative for rash. Neurological: Negative for headaches, focal weakness or numbness.   ____________________________________________   PHYSICAL EXAM:  VITAL SIGNS: ED Triage Vitals  Enc Vitals Group     BP 06/07/19 0022 90/68     Pulse Rate 06/07/19 0022 85     Resp 06/07/19 0022 19     Temp 06/07/19 0022 98.8 F (37.1 C)     Temp Source 06/07/19 0022 Oral     SpO2 06/07/19 0022 94 %     Weight 06/07/19 0015 86.6 kg (190 lb 14.7 oz)     Height 06/07/19 0015 1.753 m (5\' 9" )     Head Circumference --      Peak Flow --      Pain Score 06/07/19 0015 0     Pain Loc --      Pain Edu? --      Excl. in [place]? --     Constitutional: Alert and oriented.  Slow to respond to questions but apparently he is at his baseline per daughter. Eyes:  Conjunctivae are normal.  Head: Atraumatic. Nose: No congestion/rhinnorhea. Mouth/Throat: Patient is wearing a mask. Neck: No stridor.  No meningeal signs.   Cardiovascular: Normal rate, regular rhythm. Grossly normal heart sounds.  Dialysis  access is in left arm with palpable thrill at the site of the fistula. Respiratory: Normal respiratory effort.  No retractions. Gastrointestinal: Soft and nontender. No distention.  Musculoskeletal: No lower extremity tenderness nor edema. No gross deformities of extremities. Neurologic:  Normal speech and language. No gross focal neurologic deficits are appreciated.  Skin:  Skin is warm, dry and intact. Psychiatric: Mood and affect are normal. Speech and behavior are normal.  ____________________________________________   LABS (all labs ordered are listed, but only abnormal results are displayed)  Labs Reviewed  LACTIC ACID, PLASMA - Abnormal; Notable for the following components:      Result Value   Lactic Acid, Venous 2.1 (*)    All other components within normal limits  COMPREHENSIVE METABOLIC PANEL - Abnormal; Notable for the following components:   Sodium 132 (*)    Chloride 92 (*)    Glucose, Bld 111 (*)    BUN 30 (*)    Creatinine, Ser 5.12 (*)    Calcium 8.6 (*)    Total Protein 8.6 (*)    Alkaline Phosphatase 183 (*)    GFR calc non Af Amer 11 (*)    GFR calc Af Amer 13 (*)    All other components within normal limits  CBC WITH DIFFERENTIAL/PLATELET - Abnormal; Notable for the following components:   WBC 11.4 (*)    RBC 3.91 (*)    MCV 104.6 (*)    Monocytes Absolute 1.5 (*)    All other components within normal limits  CULTURE, BLOOD (ROUTINE X 2)  CULTURE, BLOOD (ROUTINE X 2)  SARS CORONAVIRUS 2 BY RT PCR (HOSPITAL ORDER, [place] [facility])  PROCALCITONIN  LIPASE, BLOOD  BRAIN NATRIURETIC PEPTIDE  APTT  PROTIME-INR   ____________________________________________  EKG  ED ECG REPORT I, Hinda Kehr, the attending physician, personally viewed and interpreted this ECG.  Date: 06/07/2019 EKG Time: 00: 16 Rate: 82 Rhythm: normal sinus rhythm QRS Axis: normal Intervals: normal ST/T Wave abnormalities: Non-specific ST segment / T-wave changes, but no clear evidence of acute ischemia. Narrative Interpretation: no definitive evidence of acute ischemia; does not meet STEMI criteria.   ____________________________________________  RADIOLOGY I, Hinda Kehr, personally viewed and evaluated these images (plain radiographs) as part of my medical decision making, as well as reviewing the written report by the radiologist.  ED MD interpretation: Mild cardiomegaly, mild pulmonary vascular congestion.  Official radiology report(s): DG Chest Port 1 View  Result Date: 06/07/2019 CLINICAL DATA:  Hypotension and shortness EXAM: PORTABLE CHEST 1 VIEW COMPARISON:  October 30, 2018 FINDINGS: There is mild cardiomegaly. There is prominence of the central pulmonary vasculature. No large airspace consolidation or pleural effusion. No acute osseous abnormality. IMPRESSION: Mild cardiomegaly and pulmonary vascular congestion. Electronically Signed   By: Prudencio Pair M.D.   On: 06/07/2019 00:39    ____________________________________________   PROCEDURES   Procedure(s) performed (including Critical Care):  .1-3 Lead EKG Interpretation Performed by: Hinda Kehr, MD Authorized by: Hinda Kehr, MD     Interpretation: normal     ECG rate:  79   ECG rate assessment: normal     Rhythm: sinus rhythm     Ectopy: none     Conduction: normal       ____________________________________________   INITIAL IMPRESSION / MDM / ASSESSMENT AND PLAN / ED COURSE  As part of my medical decision making, I reviewed the following data within the electronic MEDICAL RECORD NUMBER History obtained from family, Nursing notes reviewed  and incorporated, Labs reviewed , radiograph reviewed, EKG interpreted , Old  chart reviewed and Notes from prior ED visits   Differential diagnosis includes, but is not limited to, acute on chronic hypotension, volume depletion in the setting of known hypertension and recent dialysis, acute infection including the possibility of septic shock, heart block, heart failure.  The patient is on the cardiac monitor to evaluate for evidence of arrhythmia and/or significant heart rate changes.  The patient's EKG is reassuring with no sign of heart block.  He is alert and oriented and at his baseline mental status.  His blood pressure is variable, first at 90/68 and subsequently at 67/48 and occasionally even lower.  He received a dose of Midrin 10 mg by mouth at home just prior to coming to the ED.  He received 500 mL of normal saline by EMS.  His daughter is very concerned about him receiving too much fluid and they have been told that he should never receive more than 1 L of fluid, both by mouth and by IV, in a 24 hour period.  However he remains hypertensive.  I discussed with the daughter and our plan is to check basic labs and to provide another 500 mL of normal saline and to monitor.  She is very comfortable managing him if he gets his blood pressure consistently to the 96P to 90 systolic range and the patient would prefer to go home rather than being transferred to the [place].  I think this is appropriate if his work-up is reassuring and his blood pressure improves.     Clinical Course as of Jun 07 935  Thu Jun 07, 2019  0056 Mild pulmonary vascular congestion  DG Chest [facility] [CF]  [phone] Generally reassuring CMP for dialysis patient, no hyperkalemia  Comprehensive metabolic panel(!) [CF]  3846 Very mild leukocytosis, otherwise reassuring CBC  CBC WITH DIFFERENTIAL(!) [CF]  0347 Patient's BP is up to 89/61 after the fluid bolus.  He is asymptomatic.  He and his daughter/POA are comfortable going home.  I think this is appropriate.  She will give him another dose of midodrine  when they get home and he will follow-up with his regular doctor.  I gave my usual and customary return precautions.   [CF]    Clinical Course User Index [CF] Hinda Kehr, MD     ____________________________________________  FINAL CLINICAL IMPRESSION(S) / ED DIAGNOSES  Final diagnoses:  Hypotension, unspecified hypotension type     MEDICATIONS GIVEN DURING THIS VISIT:  Medications  sodium chloride 0.9 % bolus 500 mL (0 mLs Intravenous Stopped 06/07/19 0302)     ED Discharge Orders    None      *Please note:  CHRISHUN SCHEER was evaluated in Emergency Department on 06/07/2019 for the symptoms described in the history of present illness. He was evaluated in the context of the global COVID-19 pandemic, which necessitated consideration that the patient might be at risk for infection with the SARS-CoV-2 virus that causes COVID-19. Institutional protocols and algorithms that pertain to the evaluation of patients at risk for COVID-19 are in a state of rapid change based on information released by regulatory bodies including the CDC and federal and state organizations. These policies and algorithms were followed during the patient's care in the ED.  Some ED evaluations and interventions may be delayed as a result of limited staffing during the pandemic.*  Note:  This document was prepared using Dragon voice recognition software and may include unintentional  dictation errors.   Hinda Kehr, MD 06/07/19 [phone]

## 2019-06-07 NOTE — ED Triage Notes (Signed)
HD patient get dialysis M,W,F last dialyzed yesterday. Daughter unable to get BP tonight called 911. On EMS arrival BP 52/31, no radial pulses. Got 500cc bolus wih EMS.

## 2019-06-07 NOTE — ED Notes (Signed)
Date and time results received: 06/07/19    Test:Lactic Critical Value: 2.1  Name of Provider Notified: Karma Greaser

## 2019-06-07 NOTE — Discharge Instructions (Addendum)
As we discussed, and spite of your hypotension, your work-up was generally reassuring tonight.  We gave you some extra fluids and I recommend you take another dose of midodrine when you get home.  Please follow-up with your regular doctor and continue taking your regular medications.

## 2019-06-12 LAB — CULTURE, BLOOD (ROUTINE X 2)
Culture: NO GROWTH
Culture: NO GROWTH
Special Requests: ADEQUATE

## 2020-09-23 ENCOUNTER — Inpatient Hospital Stay
Admission: EM | Admit: 2020-09-23 | Discharge: 2020-09-28 | DRG: 871 | Disposition: A | Discharge status: Home Health | Payer: Medicare Other | Attending: Internal Medicine | Admitting: Internal Medicine

## 2020-09-23 ENCOUNTER — Inpatient Hospital Stay: Payer: Medicare Other

## 2020-09-23 ENCOUNTER — Encounter: Payer: Self-pay | Admitting: Internal Medicine

## 2020-09-23 ENCOUNTER — Emergency Department: Payer: Medicare Other

## 2020-09-23 ENCOUNTER — Other Ambulatory Visit: Payer: Self-pay

## 2020-09-23 DIAGNOSIS — Z79899 Other long term (current) drug therapy: Secondary | ICD-10-CM | POA: Diagnosis not present

## 2020-09-23 DIAGNOSIS — I255 Ischemic cardiomyopathy: Secondary | ICD-10-CM | POA: Diagnosis present

## 2020-09-23 DIAGNOSIS — D631 Anemia in chronic kidney disease: Secondary | ICD-10-CM | POA: Diagnosis present

## 2020-09-23 DIAGNOSIS — R404 Transient alteration of awareness: Secondary | ICD-10-CM | POA: Diagnosis not present

## 2020-09-23 DIAGNOSIS — I480 Paroxysmal atrial fibrillation: Secondary | ICD-10-CM | POA: Diagnosis present

## 2020-09-23 DIAGNOSIS — Z881 Allergy status to other antibiotic agents status: Secondary | ICD-10-CM

## 2020-09-23 DIAGNOSIS — I120 Hypertensive chronic kidney disease with stage 5 chronic kidney disease or end stage renal disease: Secondary | ICD-10-CM | POA: Diagnosis present

## 2020-09-23 DIAGNOSIS — I739 Peripheral vascular disease, unspecified: Secondary | ICD-10-CM | POA: Diagnosis not present

## 2020-09-23 DIAGNOSIS — R0902 Hypoxemia: Secondary | ICD-10-CM | POA: Diagnosis not present

## 2020-09-23 DIAGNOSIS — R509 Fever, unspecified: Secondary | ICD-10-CM

## 2020-09-23 DIAGNOSIS — L03116 Cellulitis of left lower limb: Secondary | ICD-10-CM

## 2020-09-23 DIAGNOSIS — F419 Anxiety disorder, unspecified: Secondary | ICD-10-CM | POA: Diagnosis present

## 2020-09-23 DIAGNOSIS — Z7902 Long term (current) use of antithrombotics/antiplatelets: Secondary | ICD-10-CM

## 2020-09-23 DIAGNOSIS — Z66 Do not resuscitate: Secondary | ICD-10-CM | POA: Diagnosis present

## 2020-09-23 DIAGNOSIS — Z91041 Radiographic dye allergy status: Secondary | ICD-10-CM

## 2020-09-23 DIAGNOSIS — I252 Old myocardial infarction: Secondary | ICD-10-CM | POA: Diagnosis not present

## 2020-09-23 DIAGNOSIS — A419 Sepsis, unspecified organism: Principal | ICD-10-CM

## 2020-09-23 DIAGNOSIS — J9811 Atelectasis: Secondary | ICD-10-CM | POA: Diagnosis not present

## 2020-09-23 DIAGNOSIS — I82409 Acute embolism and thrombosis of unspecified deep veins of unspecified lower extremity: Secondary | ICD-10-CM | POA: Diagnosis not present

## 2020-09-23 DIAGNOSIS — Z951 Presence of aortocoronary bypass graft: Secondary | ICD-10-CM

## 2020-09-23 DIAGNOSIS — R739 Hyperglycemia, unspecified: Secondary | ICD-10-CM | POA: Diagnosis not present

## 2020-09-23 DIAGNOSIS — N2581 Secondary hyperparathyroidism of renal origin: Secondary | ICD-10-CM | POA: Diagnosis present

## 2020-09-23 DIAGNOSIS — N186 End stage renal disease: Secondary | ICD-10-CM | POA: Diagnosis not present

## 2020-09-23 DIAGNOSIS — I2510 Atherosclerotic heart disease of native coronary artery without angina pectoris: Secondary | ICD-10-CM | POA: Diagnosis present

## 2020-09-23 DIAGNOSIS — Z9081 Acquired absence of spleen: Secondary | ICD-10-CM

## 2020-09-23 DIAGNOSIS — J811 Chronic pulmonary edema: Secondary | ICD-10-CM | POA: Diagnosis not present

## 2020-09-23 DIAGNOSIS — Z882 Allergy status to sulfonamides status: Secondary | ICD-10-CM

## 2020-09-23 DIAGNOSIS — K5909 Other constipation: Secondary | ICD-10-CM | POA: Diagnosis present

## 2020-09-23 DIAGNOSIS — E785 Hyperlipidemia, unspecified: Secondary | ICD-10-CM | POA: Diagnosis present

## 2020-09-23 DIAGNOSIS — F32A Depression, unspecified: Secondary | ICD-10-CM | POA: Diagnosis present

## 2020-09-23 DIAGNOSIS — Z91048 Other nonmedicinal substance allergy status: Secondary | ICD-10-CM

## 2020-09-23 DIAGNOSIS — Z79890 Hormone replacement therapy (postmenopausal): Secondary | ICD-10-CM

## 2020-09-23 DIAGNOSIS — Z992 Dependence on renal dialysis: Secondary | ICD-10-CM

## 2020-09-23 DIAGNOSIS — Z20822 Contact with and (suspected) exposure to covid-19: Secondary | ICD-10-CM | POA: Diagnosis present

## 2020-09-23 DIAGNOSIS — Z880 Allergy status to penicillin: Secondary | ICD-10-CM

## 2020-09-23 DIAGNOSIS — M79662 Pain in left lower leg: Secondary | ICD-10-CM

## 2020-09-23 DIAGNOSIS — R0689 Other abnormalities of breathing: Secondary | ICD-10-CM | POA: Diagnosis not present

## 2020-09-23 DIAGNOSIS — R600 Localized edema: Secondary | ICD-10-CM | POA: Diagnosis not present

## 2020-09-23 DIAGNOSIS — M79605 Pain in left leg: Secondary | ICD-10-CM | POA: Diagnosis not present

## 2020-09-23 DIAGNOSIS — Z888 Allergy status to other drugs, medicaments and biological substances status: Secondary | ICD-10-CM

## 2020-09-23 DIAGNOSIS — R000 Tachycardia, unspecified: Secondary | ICD-10-CM | POA: Diagnosis not present

## 2020-09-23 DIAGNOSIS — L0390 Cellulitis, unspecified: Secondary | ICD-10-CM | POA: Diagnosis present

## 2020-09-23 DIAGNOSIS — D649 Anemia, unspecified: Secondary | ICD-10-CM | POA: Diagnosis not present

## 2020-09-23 DIAGNOSIS — Z955 Presence of coronary angioplasty implant and graft: Secondary | ICD-10-CM

## 2020-09-23 DIAGNOSIS — Z8249 Family history of ischemic heart disease and other diseases of the circulatory system: Secondary | ICD-10-CM

## 2020-09-23 LAB — PROTIME-INR
INR: 1 (ref 0.8–1.2)
Prothrombin Time: 13.4 seconds (ref 11.4–15.2)

## 2020-09-23 LAB — CBC WITH DIFFERENTIAL/PLATELET
Abs Immature Granulocytes: 0.07 10*3/uL (ref 0.00–0.07)
Basophils Absolute: 0.1 10*3/uL (ref 0.0–0.1)
Basophils Relative: 0 %
Eosinophils Absolute: 0.1 10*3/uL (ref 0.0–0.5)
Eosinophils Relative: 1 %
HCT: 39.1 % (ref 39.0–52.0)
Hemoglobin: 12.8 g/dL — ABNORMAL LOW (ref 13.0–17.0)
Immature Granulocytes: 0 %
Lymphocytes Relative: 19 %
Lymphs Abs: 3.3 10*3/uL (ref 0.7–4.0)
MCH: 32.6 pg (ref 26.0–34.0)
MCHC: 32.7 g/dL (ref 30.0–36.0)
MCV: 99.5 fL (ref 80.0–100.0)
Monocytes Absolute: 1.1 10*3/uL — ABNORMAL HIGH (ref 0.1–1.0)
Monocytes Relative: 6 %
Neutro Abs: 13.2 10*3/uL — ABNORMAL HIGH (ref 1.7–7.7)
Neutrophils Relative %: 74 %
Platelets: 298 10*3/uL (ref 150–400)
RBC: 3.93 MIL/uL — ABNORMAL LOW (ref 4.22–5.81)
RDW: 16.9 % — ABNORMAL HIGH (ref 11.5–15.5)
Smear Review: NORMAL
WBC: 17.9 10*3/uL — ABNORMAL HIGH (ref 4.0–10.5)
nRBC: 0.2 % (ref 0.0–0.2)

## 2020-09-23 LAB — COMPREHENSIVE METABOLIC PANEL
ALT: 16 U/L (ref 0–44)
AST: 28 U/L (ref 15–41)
Albumin: 3.4 g/dL — ABNORMAL LOW (ref 3.5–5.0)
Alkaline Phosphatase: 158 U/L — ABNORMAL HIGH (ref 38–126)
Anion gap: 15 (ref 5–15)
BUN: 31 mg/dL — ABNORMAL HIGH (ref 8–23)
CO2: 28 mmol/L (ref 22–32)
Calcium: 9.1 mg/dL (ref 8.9–10.3)
Chloride: 92 mmol/L — ABNORMAL LOW (ref 98–111)
Creatinine, Ser: 4.92 mg/dL — ABNORMAL HIGH (ref 0.61–1.24)
GFR, Estimated: 12 mL/min — ABNORMAL LOW (ref >60)
Glucose, Bld: 96 mg/dL (ref 70–99)
Potassium: 4.2 mmol/L (ref 3.5–5.1)
Sodium: 135 mmol/L (ref 135–145)
Total Bilirubin: 1.1 mg/dL (ref 0.3–1.2)
Total Protein: 9.2 g/dL — ABNORMAL HIGH (ref 6.5–8.1)

## 2020-09-23 LAB — RESP PANEL BY RT-PCR (FLU A&B, COVID) ARPGX2
Influenza A by PCR: NEGATIVE
Influenza B by PCR: NEGATIVE
SARS Coronavirus 2 by RT PCR: NEGATIVE

## 2020-09-23 LAB — APTT: aPTT: 33 seconds (ref 24–36)

## 2020-09-23 LAB — HIV ANTIBODY (ROUTINE TESTING W REFLEX): HIV Screen 4th Generation wRfx: NONREACTIVE

## 2020-09-23 LAB — TROPONIN I (HIGH SENSITIVITY): Troponin I (High Sensitivity): 14 ng/L (ref <18)

## 2020-09-23 LAB — LACTIC ACID, PLASMA: Lactic Acid, Venous: 1.8 mmol/L (ref 0.5–1.9)

## 2020-09-23 MED ORDER — VANCOMYCIN HCL IN DEXTROSE 1-5 GM/200ML-% IV SOLN
1000.0000 mg | Freq: Once | INTRAVENOUS | Status: AC
  Administered 2020-09-23: 1000 mg via INTRAVENOUS
  Filled 2020-09-23: qty 200

## 2020-09-23 MED ORDER — DOCUSATE SODIUM 100 MG PO CAPS
100.0000 mg | ORAL_CAPSULE | Freq: Every day | ORAL | Status: DC
  Administered 2020-09-23 – 2020-09-28 (×6): 100 mg via ORAL
  Filled 2020-09-23 (×6): qty 1

## 2020-09-23 MED ORDER — NITROGLYCERIN 0.4 MG SL SUBL: 0.4000 mg | SUBLINGUAL_TABLET | SUBLINGUAL | Status: DC | PRN

## 2020-09-23 MED ORDER — TRAZODONE HCL 100 MG PO TABS: 100.0000 mg | ORAL_TABLET | Freq: Every evening | ORAL | Status: DC | PRN

## 2020-09-23 MED ORDER — SODIUM CHLORIDE 0.9 % IV SOLN: 2.0000 g | INTRAVENOUS | Status: DC

## 2020-09-23 MED ORDER — ACETAMINOPHEN 325 MG PO TABS
650.0000 mg | ORAL_TABLET | Freq: Three times a day (TID) | ORAL | Status: DC | PRN
  Administered 2020-09-25 – 2020-09-26 (×4): 650 mg via ORAL
  Filled 2020-09-23 (×4): qty 2

## 2020-09-23 MED ORDER — LIDOCAINE 5 % EX PTCH
1.0000 | MEDICATED_PATCH | Freq: Two times a day (BID) | CUTANEOUS | Status: DC
  Administered 2020-09-23 – 2020-09-28 (×11): 1 via TRANSDERMAL
  Filled 2020-09-23 (×12): qty 1

## 2020-09-23 MED ORDER — AMIODARONE HCL 200 MG PO TABS
200.0000 mg | ORAL_TABLET | Freq: Every day | ORAL | Status: DC
  Administered 2020-09-23 – 2020-09-28 (×6): 200 mg via ORAL
  Filled 2020-09-23 (×6): qty 1

## 2020-09-23 MED ORDER — CYCLOSPORINE 0.05 % OP EMUL
1.0000 [drp] | Freq: Two times a day (BID) | OPHTHALMIC | Status: DC
  Administered 2020-09-23 – 2020-09-28 (×10): 1 [drp] via OPHTHALMIC
  Filled 2020-09-23 (×13): qty 30

## 2020-09-23 MED ORDER — CARBOXYMETHYLCELLULOSE SODIUM 0.5 % OP SOLN: 1.0000 [drp] | OPHTHALMIC | Status: DC

## 2020-09-23 MED ORDER — PANTOPRAZOLE SODIUM 40 MG PO TBEC
40.0000 mg | DELAYED_RELEASE_TABLET | Freq: Every day | ORAL | Status: DC
  Administered 2020-09-24 – 2020-09-28 (×5): 40 mg via ORAL
  Filled 2020-09-23 (×5): qty 1

## 2020-09-23 MED ORDER — HYDROXYZINE HCL 25 MG PO TABS: 25.0000 mg | ORAL_TABLET | Freq: Three times a day (TID) | ORAL | Status: DC | PRN

## 2020-09-23 MED ORDER — MORPHINE SULFATE (PF) 4 MG/ML IV SOLN
4.0000 mg | Freq: Once | INTRAVENOUS | Status: AC
  Administered 2020-09-23: 4 mg via INTRAVENOUS
  Filled 2020-09-23: qty 1

## 2020-09-23 MED ORDER — SODIUM CHLORIDE 0.9% FLUSH: 3.0000 mL | INTRAVENOUS | Status: DC | PRN

## 2020-09-23 MED ORDER — LACTATED RINGERS IV BOLUS
500.0000 mL | Freq: Once | INTRAVENOUS | Status: AC
  Administered 2020-09-23: 500 mL via INTRAVENOUS

## 2020-09-23 MED ORDER — BUPRENORPHINE HCL 300 MCG BU FILM
300.0000 ug | ORAL_FILM | Freq: Two times a day (BID) | BUCCAL | Status: DC
  Administered 2020-09-23 – 2020-09-28 (×10): 300 ug via BUCCAL
  Filled 2020-09-23 (×7): qty 1

## 2020-09-23 MED ORDER — BUPRENORPHINE HCL 300 MCG BU FILM
150.0000 ug | ORAL_FILM | Freq: Two times a day (BID) | BUCCAL | Status: DC
  Administered 2020-09-23 – 2020-09-28 (×10): 150 ug via BUCCAL
  Filled 2020-09-23 (×8): qty 1

## 2020-09-23 MED ORDER — SODIUM CHLORIDE 0.9 % IV SOLN: 250.0000 mL | INTRAVENOUS | Status: DC | PRN

## 2020-09-23 MED ORDER — DULOXETINE HCL 30 MG PO CPEP
30.0000 mg | ORAL_CAPSULE | Freq: Every day | ORAL | Status: DC
  Administered 2020-09-23 – 2020-09-28 (×6): 30 mg via ORAL
  Filled 2020-09-23 (×6): qty 1

## 2020-09-23 MED ORDER — METRONIDAZOLE 500 MG/100ML IV SOLN
500.0000 mg | Freq: Once | INTRAVENOUS | Status: AC
  Administered 2020-09-23: 500 mg via INTRAVENOUS
  Filled 2020-09-23: qty 100

## 2020-09-23 MED ORDER — SODIUM CHLORIDE 0.9 % IV SOLN
2.0000 g | Freq: Once | INTRAVENOUS | Status: AC
  Administered 2020-09-23: 2 g via INTRAVENOUS
  Filled 2020-09-23: qty 2

## 2020-09-23 MED ORDER — PREGABALIN 50 MG PO CAPS
50.0000 mg | ORAL_CAPSULE | Freq: Every day | ORAL | Status: DC
  Administered 2020-09-23 – 2020-09-28 (×6): 50 mg via ORAL
  Filled 2020-09-23 (×4): qty 1
  Filled 2020-09-23 (×2): qty 2

## 2020-09-23 MED ORDER — LACTATED RINGERS IV BOLUS: 500.0000 mL | Freq: Once | INTRAVENOUS | Status: DC

## 2020-09-23 MED ORDER — LORATADINE 10 MG PO TABS
10.0000 mg | ORAL_TABLET | Freq: Every day | ORAL | Status: DC
  Administered 2020-09-24 – 2020-09-28 (×5): 10 mg via ORAL
  Filled 2020-09-23 (×5): qty 1

## 2020-09-23 MED ORDER — METRONIDAZOLE 500 MG/100ML IV SOLN
500.0000 mg | Freq: Two times a day (BID) | INTRAVENOUS | Status: DC
  Administered 2020-09-23 – 2020-09-24 (×3): 500 mg via INTRAVENOUS
  Filled 2020-09-23 (×4): qty 100

## 2020-09-23 MED ORDER — VANCOMYCIN HCL IN DEXTROSE 1-5 GM/200ML-% IV SOLN
1000.0000 mg | INTRAVENOUS | Status: DC
  Administered 2020-09-24: 1000 mg via INTRAVENOUS
  Filled 2020-09-23 (×3): qty 200

## 2020-09-23 MED ORDER — CINACALCET HCL 30 MG PO TABS
60.0000 mg | ORAL_TABLET | Freq: Every day | ORAL | Status: DC
  Administered 2020-09-23 – 2020-09-28 (×6): 60 mg via ORAL
  Filled 2020-09-23 (×6): qty 2

## 2020-09-23 MED ORDER — BUPRENORPHINE HCL 450 MCG BU FILM: 450.0000 ug | ORAL_FILM | Freq: Two times a day (BID) | BUCCAL | Status: DC

## 2020-09-23 MED ORDER — LACTASE 3000 UNITS PO TABS
3000.0000 [IU] | ORAL_TABLET | Freq: Three times a day (TID) | ORAL | Status: DC
  Administered 2020-09-23 – 2020-09-28 (×12): 3000 [IU] via ORAL
  Filled 2020-09-23 (×16): qty 1

## 2020-09-23 MED ORDER — ALPRAZOLAM 0.5 MG PO TABS
0.5000 mg | ORAL_TABLET | Freq: Two times a day (BID) | ORAL | Status: DC | PRN
  Administered 2020-09-23: 0.5 mg via ORAL
  Filled 2020-09-23: qty 1

## 2020-09-23 MED ORDER — VANCOMYCIN HCL IN DEXTROSE 1-5 GM/200ML-% IV SOLN: 1000.0000 mg | Freq: Once | INTRAVENOUS | Status: DC

## 2020-09-23 MED ORDER — SODIUM CHLORIDE 0.9% FLUSH
3.0000 mL | Freq: Two times a day (BID) | INTRAVENOUS | Status: DC
  Administered 2020-09-24 – 2020-09-27 (×8): 3 mL via INTRAVENOUS

## 2020-09-23 MED ORDER — MIRTAZAPINE 15 MG PO TABS
15.0000 mg | ORAL_TABLET | Freq: Every day | ORAL | Status: DC
  Administered 2020-09-23 – 2020-09-27 (×5): 15 mg via ORAL
  Filled 2020-09-23 (×5): qty 1

## 2020-09-23 MED ORDER — VITAMIN D (ERGOCALCIFEROL) 1.25 MG (50000 UNIT) PO CAPS
50000.0000 [IU] | ORAL_CAPSULE | ORAL | Status: DC
  Administered 2020-09-26: 50000 [IU] via ORAL
  Filled 2020-09-23: qty 1

## 2020-09-23 MED ORDER — ONDANSETRON HCL 4 MG PO TABS: 4.0000 mg | ORAL_TABLET | Freq: Four times a day (QID) | ORAL | Status: DC | PRN

## 2020-09-23 MED ORDER — MIDODRINE HCL 5 MG PO TABS
15.0000 mg | ORAL_TABLET | Freq: Three times a day (TID) | ORAL | Status: DC
  Administered 2020-09-23 – 2020-09-28 (×14): 15 mg via ORAL
  Filled 2020-09-23 (×17): qty 3

## 2020-09-23 MED ORDER — CHLORHEXIDINE GLUCONATE CLOTH 2 % EX PADS
6.0000 | MEDICATED_PAD | Freq: Every day | CUTANEOUS | Status: DC
  Administered 2020-09-24 – 2020-09-28 (×5): 6 via TOPICAL
  Filled 2020-09-23 (×2): qty 6

## 2020-09-23 MED ORDER — RENA-VITE PO TABS
1.0000 | ORAL_TABLET | Freq: Every day | ORAL | Status: DC
  Administered 2020-09-23 – 2020-09-26 (×4): 1 via ORAL
  Filled 2020-09-23 (×5): qty 1

## 2020-09-23 MED ORDER — MELATONIN 5 MG PO TABS
5.0000 mg | ORAL_TABLET | Freq: Every day | ORAL | Status: DC
  Administered 2020-09-23 – 2020-09-27 (×5): 5 mg via ORAL
  Filled 2020-09-23 (×5): qty 1

## 2020-09-23 MED ORDER — ATORVASTATIN CALCIUM 20 MG PO TABS
20.0000 mg | ORAL_TABLET | Freq: Every day | ORAL | Status: DC
  Administered 2020-09-23 – 2020-09-28 (×6): 20 mg via ORAL
  Filled 2020-09-23 (×8): qty 1

## 2020-09-23 MED ORDER — HEPARIN SODIUM (PORCINE) 5000 UNIT/ML IJ SOLN
5000.0000 [IU] | Freq: Three times a day (TID) | INTRAMUSCULAR | Status: DC
  Administered 2020-09-23 – 2020-09-28 (×15): 5000 [IU] via SUBCUTANEOUS
  Filled 2020-09-23 (×15): qty 1

## 2020-09-23 MED ORDER — CLOPIDOGREL BISULFATE 75 MG PO TABS
75.0000 mg | ORAL_TABLET | Freq: Every day | ORAL | Status: DC
  Administered 2020-09-23 – 2020-09-28 (×6): 75 mg via ORAL
  Filled 2020-09-23 (×6): qty 1

## 2020-09-23 MED ORDER — VANCOMYCIN HCL IN DEXTROSE 1-5 GM/200ML-% IV SOLN: 1000.0000 mg | INTRAVENOUS | Status: DC

## 2020-09-23 MED ORDER — ONDANSETRON HCL 4 MG/2ML IJ SOLN
4.0000 mg | Freq: Four times a day (QID) | INTRAMUSCULAR | Status: DC | PRN
  Administered 2020-09-25: 4 mg via INTRAVENOUS
  Filled 2020-09-23: qty 2

## 2020-09-23 MED ORDER — SENNOSIDES-DOCUSATE SODIUM 8.6-50 MG PO TABS
1.0000 | ORAL_TABLET | Freq: Two times a day (BID) | ORAL | Status: DC | PRN
  Administered 2020-09-25 – 2020-09-26 (×2): 1 via ORAL
  Filled 2020-09-23 (×2): qty 1

## 2020-09-23 MED ORDER — SODIUM CHLORIDE 0.9% FLUSH
3.0000 mL | Freq: Two times a day (BID) | INTRAVENOUS | Status: DC
  Administered 2020-09-23 – 2020-09-28 (×10): 3 mL via INTRAVENOUS

## 2020-09-23 MED ORDER — POLYVINYL ALCOHOL 1.4 % OP SOLN
1.0000 [drp] | Freq: Four times a day (QID) | OPHTHALMIC | Status: DC | PRN
  Filled 2020-09-23: qty 15

## 2020-09-23 MED ORDER — CEFEPIME HCL 1 G IJ SOLR
1.0000 g | INTRAMUSCULAR | Status: DC
  Administered 2020-09-24: 1 g via INTRAVENOUS
  Filled 2020-09-23 (×2): qty 1

## 2020-09-23 MED ORDER — SODIUM CHLORIDE 0.9 % IV SOLN: 2.0000 g | Freq: Once | INTRAVENOUS | Status: DC

## 2020-09-23 MED ORDER — CALCIUM ACETATE (PHOS BINDER) 667 MG PO CAPS
1334.0000 mg | ORAL_CAPSULE | Freq: Three times a day (TID) | ORAL | Status: DC
  Administered 2020-09-23 – 2020-09-28 (×13): 1334 mg via ORAL
  Filled 2020-09-23 (×16): qty 2

## 2020-09-23 MED ORDER — IPRATROPIUM BROMIDE 0.02 % IN SOLN: 3.0000 mL | Freq: Four times a day (QID) | RESPIRATORY_TRACT | Status: DC | PRN

## 2020-09-23 MED ORDER — MIDODRINE HCL 5 MG PO TABS
15.0000 mg | ORAL_TABLET | Freq: Four times a day (QID) | ORAL | Status: DC | PRN
  Administered 2020-09-24 – 2020-09-26 (×2): 15 mg via ORAL
  Filled 2020-09-23: qty 3

## 2020-09-23 NOTE — Progress Notes (Signed)
[facility]  ROUNDING NOTE   Subjective:   William Carpenter is a 65 year old male with previous medical conditions including dyslipidemia, coronary artery disease, peripheral artery disease, paroxysmal atrial fibrillation, and end-stage renal disease on dialysis.  Patient presents to the ED with complaints of fever and shaking chills.  He is accompanied to the ED with his daughter and personal aide.  Patient will be admitted to [facility] for Sepsis [facility]) [A41.9]  Patient is known to our clinic from previous hospitalizations.  He currently receives outpatient dialysis treatments at the [facility] in [facility]  We have been consulted to manage dialysis needs during admission.  According to his daughter, he was recently admitted to the [place] in [place] for 3 weeks for cellulitis of the left lower extremity.  He was discharged on antibiotics to treat reoccurring cellulitis.  Patient and daughter state he is compliant with his medications.  He states he has had a poor appetite today, but was able to eat full meals yesterday.  Recorded a fever of 101F prior to admission.  Denies cough, lightheadedness, and chest pain.  Does report continued redness and swelling of left lower extremity.  Daughter states he received an angiogram during hospitalization at [place].  Pertinent labs on arrival include sodium 135, potassium 4.2, BUN 31, creatinine 4.92, EGFR 12.  Chest x-ray shows pulmonary vascular congestion and left mid to lower atelectasis.  Venous ultrasound of left lower extremity negative for DVT.   Objective:  Vital signs in last 24 hours:  Temp:  [100.2 F (37.9 C)] 100.2 F (37.9 C) (09/20 0828) Pulse Rate:  [87-113] 92 (09/20 1300) Resp:  [16-25] 19 (09/20 1300) BP: (82-120)/(54-75) 89/62 (09/20 1300) SpO2:  [93 %-99 %] 97 % (09/20 1300) Weight:  [90 kg] 90 kg (09/20 0834)  Weight change:  Filed Weights   09/23/20 0834  Weight: 90 kg    Intake/Output: No intake/output data  recorded.   Intake/Output this shift:  No intake/output data recorded.  Physical Exam: General: NAD, resting in bed  Head: Normocephalic, atraumatic. Moist oral mucosal membranes  Eyes: Anicteric  Lungs:  Clear to auscultation, normal effort, room air  Heart: Regular rate and rhythm  Abdomen:  Soft, nontender, nondistended  Extremities: No peripheral edema.  Neurologic: Nonfocal, moving all four extremities  Skin: BLE tight, shiny, dark  Access: In left upper aVF    Basic Metabolic Panel: Recent Labs  Lab 09/23/20 0845  NA 135  K 4.2  CL 92*  CO2 28  GLUCOSE 96  BUN 31*  CREATININE 4.92*  CALCIUM 9.1    Liver Function Tests: Recent Labs  Lab 09/23/20 0845  AST 28  ALT 16  ALKPHOS 158*  BILITOT 1.1  PROT 9.2*  ALBUMIN 3.4*   No results for input(s): LIPASE, AMYLASE in the last 168 hours. No results for input(s): AMMONIA in the last 168 hours.  CBC: Recent Labs  Lab 09/23/20 0845  WBC 17.9*  NEUTROABS 13.2*  HGB 12.8*  HCT 39.1  MCV 99.5  PLT 298    Cardiac Enzymes: No results for input(s): CKTOTAL, CKMB, CKMBINDEX, TROPONINI in the last 168 hours.  BNP: Invalid input(s): POCBNP  CBG: No results for input(s): GLUCAP in the last 168 hours.  Microbiology: Results for orders placed or performed during the hospital encounter of 09/23/20  Resp Panel by RT-PCR (Flu A&B, Covid) Nasopharyngeal Swab     Status: None   Collection Time: 09/23/20  8:45 AM   Specimen: Nasopharyngeal  Swab; Nasopharyngeal(NP) swabs in vial transport medium  Result Value Ref Range Status   SARS Coronavirus 2 by RT PCR NEGATIVE NEGATIVE Final    Comment: (NOTE) SARS-CoV-2 target nucleic acids are NOT DETECTED.  The SARS-CoV-2 RNA is generally detectable in upper respiratory specimens during the acute phase of infection. The lowest concentration of SARS-CoV-2 viral copies this assay can detect is 138 copies/mL. A negative result does not preclude SARS-Cov-2 infection and  should not be used as the sole basis for treatment or other patient management decisions. A negative result may occur with  improper specimen collection/handling, submission of specimen other than nasopharyngeal swab, presence of viral mutation(s) within the areas targeted by this assay, and inadequate number of viral copies(<138 copies/mL). A negative result must be combined with clinical observations, patient history, and epidemiological information. The expected result is Negative.  Fact Sheet for Patients:  EntrepreneurPulse.com.au  Fact Sheet for Healthcare Providers:  IncredibleEmployment.be  This test is no t yet approved or cleared by the [place] FDA and  has been authorized for detection and/or diagnosis of SARS-CoV-2 by FDA under an Emergency Use Authorization (EUA). This EUA will remain  in effect (meaning this test can be used) for the duration of the COVID-19 declaration under Section 564(b)(1) of the Act, 21 U.S.C.section 360bbb-3(b)(1), unless the authorization is terminated  or revoked sooner.       Influenza A by PCR NEGATIVE NEGATIVE Final   Influenza B by PCR NEGATIVE NEGATIVE Final    Comment: (NOTE) The Xpert Xpress SARS-CoV-2/FLU/RSV plus assay is intended as an aid in the diagnosis of influenza from Nasopharyngeal swab specimens and should not be used as a sole basis for treatment. Nasal washings and aspirates are unacceptable for Xpert Xpress SARS-CoV-2/FLU/RSV testing.  Fact Sheet for Patients: EntrepreneurPulse.com.au  Fact Sheet for Healthcare Providers: IncredibleEmployment.be  This test is not yet approved or cleared by the [place] FDA and has been authorized for detection and/or diagnosis of SARS-CoV-2 by FDA under an Emergency Use Authorization (EUA). This EUA will remain in effect (meaning this test can be used) for the duration of the COVID-19 declaration  under Section 564(b)(1) of the Act, 21 U.S.C. section 360bbb-3(b)(1), unless the authorization is terminated or revoked.  Performed at [facility], [address] [place], [place] [address]     Coagulation Studies: Recent Labs    09/23/20 0845  LABPROT 13.4  INR 1.0    Urinalysis: No results for input(s): COLORURINE, LABSPEC, PHURINE, GLUCOSEU, HGBUR, BILIRUBINUR, KETONESUR, PROTEINUR, UROBILINOGEN, NITRITE, LEUKOCYTESUR in the last 72 hours.  Invalid input(s): APPERANCEUR    Imaging: US Venous Img Lower Unilateral Left (DVT)  Result Date: 09/23/2020 CLINICAL DATA:  Left lower extremity pain and edema. History of malignancy. Evaluate for DVT. EXAM: LEFT LOWER EXTREMITY VENOUS DOPPLER ULTRASOUND TECHNIQUE: Gray-scale sonography with graded compression, as well as color Doppler and duplex ultrasound were performed to evaluate the lower extremity deep venous systems from the level of the common femoral vein and including the common femoral, femoral, profunda femoral, popliteal and calf veins including the posterior tibial, peroneal and gastrocnemius veins when visible. The superficial great saphenous vein was also interrogated. Spectral Doppler was utilized to evaluate flow at rest and with distal augmentation maneuvers in the common femoral, femoral and popliteal veins. COMPARISON:  None. FINDINGS: Contralateral Common Femoral Vein: Respiratory phasicity is normal and symmetric with the symptomatic side. No evidence of thrombus. Normal compressibility. Common Femoral Vein: No evidence of thrombus. Normal compressibility, respiratory phasicity  and response to augmentation. Saphenofemoral Junction: No evidence of thrombus. Normal compressibility and flow on color Doppler imaging. Profunda Femoral Vein: No evidence of thrombus. Normal compressibility and flow on color Doppler imaging. Femoral Vein: No evidence of thrombus. Normal compressibility, respiratory phasicity and response to  augmentation. Popliteal Vein: No evidence of thrombus. Normal compressibility, respiratory phasicity and response to augmentation. Calf Veins: No evidence of thrombus. Normal compressibility and flow on color Doppler imaging. Superficial Great Saphenous Vein: No evidence of thrombus. Normal compressibility. Other Findings:  None. IMPRESSION: No evidence of DVT within the left lower extremity. Electronically Signed   By: John  Watts M.D.   On: 09/23/2020 13:51   DG Chest Port 1 View  Result Date: 09/23/2020 CLINICAL DATA:  Suspect sepsis.  Complains of ongoing fever. EXAM: PORTABLE CHEST 1 VIEW COMPARISON:  06/07/2019 FINDINGS: Normal heart size. Pulmonary vascular congestion identified. Areas of platelike atelectasis noted within the left mid and left lower lung. No airspace consolidation identified. IMPRESSION: Left mid and lower lung platelike atelectasis. Electronically Signed   By: Taylor  Stroud M.D.   On: 09/23/2020 09:25     Medications:    sodium chloride     metronidazole      amiodarone  200 mg Oral Daily   atorvastatin  20 mg Oral Daily   Buprenorphine HCl  150 mcg Buccal BID   And   Buprenorphine HCl  300 mcg Buccal BID   calcium acetate  1,334 mg Oral TID WC   [START ON 09/24/2020] Chlorhexidine Gluconate Cloth  6 each Topical Q0600   cinacalcet  60 mg Oral Q breakfast   clopidogrel  75 mg Oral Daily   docusate sodium  100 mg Oral Daily   DULoxetine  30 mg Oral Daily   heparin  5,000 Units Subcutaneous Q8H   lactase  3,000 Units Oral TID WC   lidocaine  1 patch Transdermal Q12H   [START ON 09/24/2020] loratadine  10 mg Oral Daily   melatonin  5 mg Oral QHS   midodrine  15 mg Oral TID WC   mirtazapine  15 mg Oral QHS   multivitamin  1 tablet Oral QHS   [START ON 09/24/2020] pantoprazole  40 mg Oral Daily   pregabalin  50 mg Oral Daily   sodium chloride flush  3 mL Intravenous Q12H   sodium chloride flush  3 mL Intravenous Q12H   [START ON 09/26/2020] Vitamin D  (Ergocalciferol)  50,000 Units Oral Weekly   sodium chloride, acetaminophen, ALPRAZolam, hydrOXYzine, ipratropium, midodrine, nitroGLYCERIN, ondansetron **OR** ondansetron (ZOFRAN) IV, polyvinyl alcohol, senna-docusate, sodium chloride flush, traZODone  Assessment/ Plan:  Mr. Lillie K Szczerba is a 65 y.o.  male with previous medical conditions including dyslipidemia, coronary artery disease, peripheral artery disease, paroxysmal atrial fibrillation, and end-stage renal disease on dialysis. Patient admitted to [facility] with Sepsis (HCC) [A41.9]  [facility]/MWF/Lt AVF  End stage renal disease on dialysis. Received full treatment yesterday, prior to admission. Will plan to dialyze tomorrow.   Anemia of chronic kidney disease Lab Results  Component Value Date   HGB 12.8 (L) 09/23/2020  Hgb at goal   Secondary Hyperparathyroidism:   Lab Results  Component Value Date   CALCIUM 9.1 09/23/2020   PHOS 2.9 11/04/2018  Calcium at goal, will check phosphorus in am  4. Sepsis from left lower extremity cellulitis: febrile, tachycardia Venous US negative for DVT, IV antibiotics      LOS: 0 Shantelle Breeze 9/20/20223:12 PM   

## 2020-09-23 NOTE — Consult Note (Signed)
CODE SEPSIS - PHARMACY COMMUNICATION  **Broad Spectrum Antibiotics should be administered within 1 hour of Sepsis diagnosis**  Time Code Sepsis Called/Page Received: no page/consult at 1149  Antibiotics Ordered:  Cefepime vancomycin  Time of 1st antibiotic administration: cefepime 2gm given @0900    Kester Stimpson Rodriguez-Guzman PharmD, BCPS 09/23/2020 12:19 PM

## 2020-09-23 NOTE — Progress Notes (Signed)
PHARMACY -  BRIEF ANTIBIOTIC NOTE   Pharmacy has received consult(s) for Vancomycin and Cefepime from an ED provider.  The patient's profile has been reviewed for ht/wt/allergies/indication/available labs.    One time order(s) placed by MD for Cefepime 2 gm and Vancomycin 1000 mg  Further antibiotics/pharmacy consults should be ordered by admitting physician if indicated.                       Thank you, Roxy Filler A 09/23/2020  8:54 AM

## 2020-09-23 NOTE — Progress Notes (Signed)
Elink following code sepsis °

## 2020-09-23 NOTE — Progress Notes (Signed)
CODE SEPSIS - PHARMACY COMMUNICATION  **Broad Spectrum Antibiotics should be administered within 1 hour of Sepsis diagnosis**  Time Code Sepsis Called/Page Received: 0349  Antibiotics Ordered: cefepime/vanc/metro  Time of 1st antibiotic administration: 0856  Additional action taken by pharmacy:    If necessary, Name of Provider/Nurse Contacted:      Noralee Space ,PharmD Clinical Pharmacist  09/23/2020  9:24 AM

## 2020-09-23 NOTE — H&P (Signed)
History and Physical    ULICES MAACK ZDG:[id number] DOB: 11/09/1955 DOA: 09/23/2020  PCP: Raelyn Mora, MD   Patient coming from: Home  I have personally briefly reviewed patient's old medical records in [place]  Chief Complaint: Fever  HPI: William Carpenter is a 65 y.o. male with medical history significant for end-stage renal disease on hemodialysis (M/W/F), history of paroxysmal atrial fibrillation, dyslipidemia, coronary artery disease, peripheral arterial disease who presents to the ER via EMS for evaluation of fever and shaking chills. Patient's daughter who provided most of the history states that he was hospitalized at the [facility] for about 3 weeks for sepsis from left lower extremity cellulitis.  He was discharged on doxycycline as prophylaxis for recurrent cellulitis and has taken that as prescribed without missing any doses. One day prior to his admission he states that he did not feel well and on the morning of his admission he developed shaking chills with a fever and had a T-max of 101F.  He also has redness and swelling involving his left lower extremity. He denies having any chest pain, no shortness of breath, no cough, no abdominal pain, no changes in his bowel habits, no nausea, no vomiting, no cough, no dizziness, no lightheadedness, no palpitations, no diaphoresis, no focal deficits of blurred vision. Labs show sodium 135, potassium 4.2, chloride 92, bicarb 28, glucose 96, BUN 31, creatinine 4.92, calcium 9.1, alkaline phosphatase 158, albumin 3.4, AST 28, ALT 16, total protein 9.2, troponin 14, lactic acid 1.8, white count 17.9, hemoglobin 12.8, hematocrit 39.1, MCV 99.5, RDW 16.9, platelet count 298, PT 13.4, INR 1.0 Respiratory viral panel is negative Chest x-ray reviewed by me shows atelectasis in the left mid and lower lung Twelve-lead EKG reviewed by me shows sinus tachycardia    ED Course: Patient is a 65 year old male with multiple medical  problems who presents to the emergency room for evaluation of sudden onset fever and chills with a T-max of 101.  Patient has redness and swelling in his left lower extremity consistent with cellulitis. In the ER he received a dose of IV Flagyl, vancomycin and cefepime. He will be admitted to the hospital for sepsis from left lower extremity cellulitis.  Review of Systems: As per HPI otherwise all other systems reviewed and negative.    Past Medical History:  Diagnosis Date   Bullous dermatosis    CAD (coronary artery disease)    a. 10/2018 Ant STEMI/PCI: LM nl, LAD 70p, 33m (3.5x16 Synergy DES and 3.0x28 Synergy DES), 50d, D1 95, D2 90, D3 70, LCX 50ost, OM2 60, lat OM2 90, RCA nl, RPDA 90. EF ~ 35%; b. 10/2018 Staged PCI of 90% RPDA (2.25x15 Resolute Onyx DES).   Dysphagia    ESRD (end stage renal disease) ([place])    FSGS (focal segmental glomerulosclerosis)    Hyperlipidemia    Ischemic cardiomyopathy    a. 10/2018 Echo: EF 50-55%, DD, apical AK, mid to apical anteroseptal AK. Mild to mod LAE. Mild to mod AoV sclerosis w/o stenosis.   PAF (paroxysmal atrial fibrillation) ([place])    a. Noted briefly following MI-->oral amio.   Spinal stenosis, cervical region     Past Surgical History:  Procedure Laterality Date   CARPAL TUNNEL RELEASE     CORONARY STENT INTERVENTION N/A 11/01/2018   Procedure: CORONARY STENT INTERVENTION;  Surgeon: Nelva Bush, MD;  Location: [place] [facility];  Service: Cardiovascular;  Laterality: N/A;   CORONARY/GRAFT ACUTE MI REVASCULARIZATION N/A 10/30/2018  Procedure: Coronary/Graft Acute MI Revascularization;  Surgeon: Nelva Bush, MD;  Location: [place] [facility];  Service: Cardiovascular;  Laterality: N/A;   LEFT HEART CATH AND CORONARY ANGIOGRAPHY N/A 10/30/2018   Procedure: LEFT HEART CATH AND CORONARY ANGIOGRAPHY;  Surgeon: Nelva Bush, MD;  Location: [place];  Service: Cardiovascular;  Laterality: N/A;   RIGHT/LEFT HEART  CATH AND CORONARY ANGIOGRAPHY N/A 11/01/2018   Procedure: RIGHT/LEFT HEART CATH AND CORONARY ANGIOGRAPHY;  Surgeon: Nelva Bush, MD;  Location: [place] [facility];  Service: Cardiovascular;  Laterality: N/A;   SPINAL FUSION  2018   spleen  2015   removed     reports that he has never smoked. He has never used smokeless tobacco. He reports that he does not drink alcohol and does not use drugs.  Allergies  Allergen Reactions   Albumin (Human) Hives   Erythromycin Hives   Iodinated Diagnostic Agents Hives   Iron Dextran    Metoprolol Tartrate    Penicillins Swelling   Quinine Sulfate [Quinine]    Rabeprazole    Sulfa Antibiotics Other (See Comments)   Tape Other (See Comments)    Removes skin.   Trimethoprim    Aloe     Family History  Problem Relation Age of Onset   Cancer Mother    COPD Mother    Heart disease Father    Parkinson's disease Father       Prior to Admission medications   Medication Sig Start Date End Date Taking? Authorizing Provider  acetaminophen (TYLENOL) 325 MG tablet Take 650 mg by mouth every 8 (eight) hours as needed (pain or fever).    Yes [provider]  ALPRAZolam Duanne Moron) 0.5 MG tablet Take 0.5 mg by mouth 2 (two) times daily as needed for anxiety. 08/30/20  Yes [provider]  atorvastatin (LIPITOR) 40 MG tablet Take 20 mg by mouth daily.    Yes [provider]  b complex-vitamin c-folic acid (NEPHRO-VITE) 0.8 MG TABS tablet Take 1 tablet by mouth at bedtime.   Yes [provider]  betamethasone dipropionate (DIPROLENE) 0.05 % ointment Apply topically 2 (two) times daily.   Yes [provider]  Buprenorphine HCl (BELBUCA) 450 MCG FILM Take 450 mcg by mouth every 12 (twelve) hours. 08/06/20 02/06/21 Yes [provider]  calcium acetate (PHOSLO) 667 MG capsule Take 1,334 mg by mouth 3 (three) times daily with meals. ON HOLD   Yes [provider]  carboxymethylcellulose (REFRESH PLUS)  0.5 % SOLN 1 drop every 2 (two) hours while awake.   Yes [provider]  cinacalcet (SENSIPAR) 60 MG tablet Take 60 mg by mouth daily.   Yes [provider]  clobetasol ointment (TEMOVATE) 9.47 % Apply 1 application topically 2 (two) times daily.   Yes [provider]  clopidogrel (PLAVIX) 75 MG tablet Take 75 mg by mouth daily.   Yes [provider]  cycloSPORINE (RESTASIS) 0.05 % ophthalmic emulsion Place 1 drop into both eyes 2 (two) times daily.   Yes [provider]  docusate sodium (COLACE) 100 MG capsule Take 100 mg by mouth daily.   Yes [provider]  doxycycline (MONODOX) 100 MG capsule Take 100 mg by mouth 2 (two) times daily.   Yes [provider]  DULoxetine (CYMBALTA) 30 MG capsule Take 30 mg by mouth daily.   Yes [provider]  fluticasone (FLONASE) 50 MCG/ACT nasal spray Place 2 sprays into both nostrils daily.   Yes [provider]  guaiFENesin (MUCINEX) 600 MG 12 hr tablet Take 600 mg by mouth 2 (two) times daily as needed for cough or to loosen phlegm.   Yes [provider]  hydrOXYzine (VISTARIL) 25 MG capsule Take 25 mg by mouth 3 (three) times daily as needed.   Yes [provider]  ipratropium (ATROVENT HFA) 17 MCG/ACT inhaler Inhale 2 puffs into the lungs every 6 (six) hours as needed for wheezing.   Yes [provider]  ipratropium (ATROVENT) 0.03 % nasal spray Place 2 sprays into both nostrils 3 (three) times daily.   Yes [provider]  ketoconazole (NIZORAL) 2 % shampoo Apply 1 application topically 2 (two) times a week.   Yes [provider]  lactase (LACTAID) 3000 units tablet Take 3,000 Units by mouth 3 (three) times daily with meals.   Yes [provider]  lidocaine (LIDODERM) 5 % Place 1 patch onto the skin every 12 (twelve) hours. Remove & Discard patch within 12 hours or as directed by MD   Yes [provider]   lidocaine (XYLOCAINE) 5 % ointment Apply 1 application topically 2 (two) times daily as needed for mild pain.   Yes [provider]  loratadine (CLARITIN) 10 MG tablet Take 10 mg by mouth daily.   Yes [provider]  Melatonin 5 MG CAPS Take by mouth at bedtime.    Yes [provider]  midodrine (PROAMATINE) 5 MG tablet Take 15 mg by mouth 4 (four) times daily as needed.   Yes [provider]  mirtazapine (REMERON) 15 MG tablet Take 15 mg by mouth at bedtime.   Yes [provider]  omeprazole (PRILOSEC) 20 MG capsule Take 20 mg by mouth 2 (two) times daily before a meal.   Yes [provider]  polyvinyl alcohol (LIQUIFILM TEARS) 1.4 % ophthalmic solution Place 1 drop into both eyes 4 (four) times daily as needed for dry eyes.   Yes [provider]  pregabalin (LYRICA) 50 MG capsule Take 50 mg by mouth daily. 08/06/20  Yes [provider]  senna-docusate (SENOKOT-S) 8.6-50 MG tablet Take 1 tablet by mouth 2 (two) times daily as needed (to prevent constipation; scheduled until bowel movement).    Yes [provider]  simethicone (MYLICON) 40 EV/0.3JK drops Take 40 mg by mouth 4 (four) times daily as needed for flatulence.   Yes [provider]  traZODone (DESYREL) 100 MG tablet Take 100 mg by mouth at bedtime as needed for sleep.   Yes [provider]  Vitamin D, Ergocalciferol, (DRISDOL) 1.25 MG (50000 UNIT) CAPS capsule Take 50,000 Units by mouth once a week. 08/06/20  Yes [provider]  amiodarone (PACERONE) 200 MG tablet Take 1 tablet (200 mg total) by mouth daily. 11/23/18 12/23/18  Theora Gianotti, NP  cetirizine (ZYRTEC) 10 MG tablet Take 10 mg by mouth daily. Patient not taking: Reported on 09/23/2020    [provider]  cinacalcet (SENSIPAR) 90 MG tablet Take 90 mg by mouth daily. ON HOLD Patient not taking: No sig reported    [provider]   dorzolamidel-timolol (COSOPT) 22.3-6.8 MG/ML SOLN ophthalmic solution 1 drop 2 (two) times daily. Patient not taking: Reported on 09/23/2020    [provider]  hydrALAZINE (APRESOLINE) 10 MG tablet Take 10 mg by mouth every 4 (four) hours as needed (systolic BP >093GHWE).  Patient not taking: No sig reported    [provider]  ibuprofen (ADVIL) 400 MG tablet Take  400 mg by mouth every 6 (six) hours as needed. 06/20/20   [provider]  lactulose (CHRONULAC) 10 GM/15ML solution Take by mouth 3 (three) times daily. Patient not taking: No sig reported    [provider]  latanoprost (XALATAN) 0.005 % ophthalmic solution Place 1 drop into both eyes at bedtime. Patient not taking: No sig reported    [provider]  mupirocin cream (BACTROBAN) 2 % Apply 1 application topically 2 (two) times daily. Patient not taking: Reported on 09/23/2020 09/30/16   Frederich Cha, MD  naloxone [facility]) nasal spray 4 mg/0.1 mL Place 1 spray into the nose.    [provider]  nitroGLYCERIN (NITROSTAT) 0.4 MG SL tablet Place 1 tablet (0.4 mg total) under the tongue every 5 (five) minutes as needed for chest pain. 11/23/18 02/21/19  Theora Gianotti, NP  oxycodone (OXY-IR) 5 MG capsule Take 5-10 mg by mouth every 4 (four) hours as needed for pain.  Patient not taking: No sig reported    [provider]  sertraline (ZOLOFT) 50 MG tablet Take 150 mg by mouth at bedtime. Patient not taking: Reported on 09/23/2020    [provider]  ticagrelor (BRILINTA) 90 MG TABS tablet Take 1 tablet (90 mg total) by mouth 2 (two) times daily. Patient not taking: No sig reported 11/07/18   Marcell Anger, MD    Physical Exam: Vitals:   09/23/20 0930 09/23/20 1000 09/23/20 1030 09/23/20 1100  BP: 101/63 92/63 (!) 87/54 (!) 85/55  Pulse: (!) 106 (!) 104 (!) 102 90  Resp: 16 19 (!) 24 (!) 25  Temp:      TempSrc:      SpO2: 95% 95% 93% 98%   Weight:      Height:         Vitals:   09/23/20 0930 09/23/20 1000 09/23/20 1030 09/23/20 1100  BP: 101/63 92/63 (!) 87/54 (!) 85/55  Pulse: (!) 106 (!) 104 (!) 102 90  Resp: 16 19 (!) 24 (!) 25  Temp:      TempSrc:      SpO2: 95% 95% 93% 98%  Weight:      Height:          Constitutional: Alert and oriented x 3 . Not in any apparent distress HEENT:      Head: Normocephalic and atraumatic.         Eyes: PERLA, EOMI, Conjunctivae are normal. Sclera is non-icteric.       Mouth/Throat: Mucous membranes are moist.       Neck: Supple with no signs of meningismus. Cardiovascular: Regular rate and rhythm. No murmurs, gallops, or rubs. 2+ symmetrical distal pulses are present . No JVD. Swelling involving the left lower extremity Respiratory: Respiratory effort normal .Lungs sounds clear bilaterally. No wheezes, crackles, or rhonchi.  Gastrointestinal: Soft, non tender, and non distended with positive bowel sounds.  Genitourinary: No CVA tenderness. Musculoskeletal: Nontender with normal range of motion in all extremities. No cyanosis, or erythema of extremities. Neurologic:  Face is symmetric. Moving all extremities. No gross focal neurologic deficits . Skin: Skin is warm, dry.  No rash or ulcers, redness/swelling involving the left leg, differential warmth involving the left leg, gangrene involving the toes of the left foot Psychiatric: Mood and affect are normal    Labs on Admission: I have personally reviewed following labs and imaging studies  CBC: Recent Labs  Lab 09/23/20 0845  WBC 17.9*  NEUTROABS 13.2*  HGB 12.8*  HCT 39.1  MCV 99.5  PLT 916   Basic Metabolic Panel: Recent Labs  Lab 09/23/20 0845  NA 135  K 4.2  CL 92*  CO2 28  GLUCOSE 96  BUN 31*  CREATININE 4.92*  CALCIUM 9.1   GFR: Estimated Creatinine Clearance: 16.6 mL/min (A) (by C-G formula based on SCr of 4.92 mg/dL (H)). Liver Function Tests: Recent Labs  Lab 09/23/20 0845  AST 28  ALT 16   ALKPHOS 158*  BILITOT 1.1  PROT 9.2*  ALBUMIN 3.4*   No results for input(s): LIPASE, AMYLASE in the last 168 hours. No results for input(s): AMMONIA in the last 168 hours. Coagulation Profile: Recent Labs  Lab 09/23/20 0845  INR 1.0   Cardiac Enzymes: No results for input(s): CKTOTAL, CKMB, CKMBINDEX, TROPONINI in the last 168 hours. BNP (last 3 results) No results for input(s): PROBNP in the last 8760 hours. HbA1C: No results for input(s): HGBA1C in the last 72 hours. CBG: No results for input(s): GLUCAP in the last 168 hours. Lipid Profile: No results for input(s): CHOL, HDL, LDLCALC, TRIG, CHOLHDL, LDLDIRECT in the last 72 hours. Thyroid Function Tests: No results for input(s): TSH, T4TOTAL, FREET4, T3FREE, THYROIDAB in the last 72 hours. Anemia Panel: No results for input(s): VITAMINB12, FOLATE, FERRITIN, TIBC, IRON, RETICCTPCT in the last 72 hours. Urine analysis: No results found for: COLORURINE, APPEARANCEUR, LABSPEC, PHURINE, GLUCOSEU, HGBUR, BILIRUBINUR, KETONESUR, PROTEINUR, UROBILINOGEN, NITRITE, LEUKOCYTESUR  Radiological Exams on Admission: DG Chest Port 1 View  Result Date: 09/23/2020 CLINICAL DATA:  Suspect sepsis.  Complains of ongoing fever. EXAM: PORTABLE CHEST 1 VIEW COMPARISON:  06/07/2019 FINDINGS: Normal heart size. Pulmonary vascular congestion identified. Areas of platelike atelectasis noted within the left mid and left lower lung. No airspace consolidation identified. IMPRESSION: Left mid and lower lung platelike atelectasis. Electronically Signed   By: Kerby Moors M.D.   On: 09/23/2020 09:25     Assessment/Plan Principal Problem:   Sepsis ([place]) Active Problems:   ESRD on dialysis ([place])   Ischemic cardiomyopathy   PAF (paroxysmal atrial fibrillation) ([place])   Cellulitis   PAD (peripheral artery disease) ([place])     Patient is a 65 year old Caucasian male with multiple medical problems who presents to the ER for evaluation of fever, shaking  chills, redness and was found to have cellulitis involving the left lower extremity.   Sepsis from left lower extremity cellulitis (POA) As evidenced by fever with a T-max of 101, tachycardia, marked leukocytosis and left lower extremity cellulitis. 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  CODE STATUS was discussed and he is a DO NOT RESUSCITATE.  They verbalized understanding and agree with the treatment plan Disposition Plan: Back to previous home environment Consults called: Nephrology, vascular surgery Status:At the time of admission,  it appears that the appropriate admission status for this patient is inpatient. This is judged to be reasonable and necessary to provide the required intensity of service to ensure the patient's safety given the presenting symptoms, physical exam findings, and initial radiographic and laboratory data in the context of their comorbid conditions. Patient requires inpatient status due to high intensity of service, high risk for further deterioration and high frequency of surveillance required.     Shakima Nisley MD [facility]     09/23/2020, 12:00 PM

## 2020-09-23 NOTE — ED Notes (Signed)
Pt states that he does not make urine anymore. EDP made aware.

## 2020-09-23 NOTE — Consult Note (Signed)
[facility] Vascular Consult Note  MRN : [id number]  William Carpenter is a 65 y.o. (Feb 18, 1955) male who presents with chief complaint of  Chief Complaint  Patient presents with   Fever   History of Present Illness:  William Carpenter is a 65 year old male with past medical history of hyperlipidemia, CAD, paroxysmal atrial fibrillation, FSGS, and ESRD on HD (MWF) who presents to the ED complaining of fever.    EMS reports that patient woke up this morning with shaking and chills, called his daughter to the room, who found his temperature to be 101.  Daughter had recently noticed increase swelling and redness to patient's left lower extremity, tracking up his shin from his left foot.  He was previously admitted 1 to 2 months ago at the [place] for sepsis related to cellulitis.  He currently takes doxycycline to prevent against recurrent cellulitis, has not missed any doses.  He denies any cough, chest pain, shortness of breath, abdominal pain, vomiting, or diarrhea.  He does not currently make urine.  He last underwent a full run of dialysis yesterday at the [place].  The patient has been under the care of the [facility] vascular surgeon.  The patient has known peripheral artery disease to the bilateral lower extremity.  The patient and his family member at the bedside state a recent angiogram and attempt to reestablish blood flow to the left lower extremity.  The patient told he experienced an embolic event distally to the left lower extremity status post recent angiogram.  Patient is due to undergo either a TMA or below the knee amputation with his vascular surgeon at the [place] in the near future.  Patient is due to undergo a meeting tomorrow in regard to discussing this.  Vascular surgery was consulted by Dr. Damita Dunnings for possible endovascular or open intervention. Current Facility-Administered Medications  Medication Dose Route Frequency Provider Last Rate Last Admin   midodrine  (PROAMATINE) tablet 15 mg  15 mg Oral TID WC Blake Divine, MD   15 mg at 09/23/20 1043   Current Outpatient Medications  Medication Sig Dispense Refill   acetaminophen (TYLENOL) 325 MG tablet Take 650 mg by mouth every 8 (eight) hours as needed (pain or fever).      ALPRAZolam (XANAX) 0.5 MG tablet Take 0.5 mg by mouth 2 (two) times daily as needed for anxiety.     atorvastatin (LIPITOR) 40 MG tablet Take 20 mg by mouth daily.      b complex-vitamin c-folic acid (NEPHRO-VITE) 0.8 MG TABS tablet Take 1 tablet by mouth at bedtime.     betamethasone dipropionate (DIPROLENE) 0.05 % ointment Apply topically 2 (two) times daily.     Buprenorphine HCl (BELBUCA) 450 MCG FILM Take 450 mcg by mouth every 12 (twelve) hours.     calcium acetate (PHOSLO) 667 MG capsule Take 1,334 mg by mouth 3 (three) times daily with meals. ON HOLD     carboxymethylcellulose (REFRESH PLUS) 0.5 % SOLN 1 drop every 2 (two) hours while awake.     cinacalcet (SENSIPAR) 60 MG tablet Take 60 mg by mouth daily.     clobetasol ointment (TEMOVATE) 6.57 % Apply 1 application topically 2 (two) times daily.     clopidogrel (PLAVIX) 75 MG tablet Take 75 mg by mouth daily.     cycloSPORINE (RESTASIS) 0.05 % ophthalmic emulsion Place 1 drop into both eyes 2 (two) times daily.     docusate sodium (COLACE) 100 MG capsule Take 100  mg by mouth daily.     doxycycline (MONODOX) 100 MG capsule Take 100 mg by mouth 2 (two) times daily.     DULoxetine (CYMBALTA) 30 MG capsule Take 30 mg by mouth daily.     fluticasone (FLONASE) 50 MCG/ACT nasal spray Place 2 sprays into both nostrils daily.     guaiFENesin (MUCINEX) 600 MG 12 hr tablet Take 600 mg by mouth 2 (two) times daily as needed for cough or to loosen phlegm.     hydrOXYzine (VISTARIL) 25 MG capsule Take 25 mg by mouth 3 (three) times daily as needed.     ipratropium (ATROVENT HFA) 17 MCG/ACT inhaler Inhale 2 puffs into the lungs every 6 (six) hours as needed for wheezing.      ipratropium (ATROVENT) 0.03 % nasal spray Place 2 sprays into both nostrils 3 (three) times daily.     ketoconazole (NIZORAL) 2 % shampoo Apply 1 application topically 2 (two) times a week.     lactase (LACTAID) 3000 units tablet Take 3,000 Units by mouth 3 (three) times daily with meals.     lidocaine (LIDODERM) 5 % Place 1 patch onto the skin every 12 (twelve) hours. Remove & Discard patch within 12 hours or as directed by MD     lidocaine (XYLOCAINE) 5 % ointment Apply 1 application topically 2 (two) times daily as needed for mild pain.     loratadine (CLARITIN) 10 MG tablet Take 10 mg by mouth daily.     Melatonin 5 MG CAPS Take by mouth at bedtime.      midodrine (PROAMATINE) 5 MG tablet Take 15 mg by mouth 4 (four) times daily as needed.     mirtazapine (REMERON) 15 MG tablet Take 15 mg by mouth at bedtime.     omeprazole (PRILOSEC) 20 MG capsule Take 20 mg by mouth 2 (two) times daily before a meal.     polyvinyl alcohol (LIQUIFILM TEARS) 1.4 % ophthalmic solution Place 1 drop into both eyes 4 (four) times daily as needed for dry eyes.     pregabalin (LYRICA) 50 MG capsule Take 50 mg by mouth daily.     senna-docusate (SENOKOT-S) 8.6-50 MG tablet Take 1 tablet by mouth 2 (two) times daily as needed (to prevent constipation; scheduled until bowel movement).      simethicone (MYLICON) 40 VE/9.3YB drops Take 40 mg by mouth 4 (four) times daily as needed for flatulence.     traZODone (DESYREL) 100 MG tablet Take 100 mg by mouth at bedtime as needed for sleep.     Vitamin D, Ergocalciferol, (DRISDOL) 1.25 MG (50000 UNIT) CAPS capsule Take 50,000 Units by mouth once a week.     amiodarone (PACERONE) 200 MG tablet Take 1 tablet (200 mg total) by mouth daily. 30 tablet 11   cetirizine (ZYRTEC) 10 MG tablet Take 10 mg by mouth daily. (Patient not taking: Reported on 09/23/2020)     cinacalcet (SENSIPAR) 90 MG tablet Take 90 mg by mouth daily. ON HOLD (Patient not taking: No sig reported)      dorzolamidel-timolol (COSOPT) 22.3-6.8 MG/ML SOLN ophthalmic solution 1 drop 2 (two) times daily. (Patient not taking: Reported on 09/23/2020)     hydrALAZINE (APRESOLINE) 10 MG tablet Take 10 mg by mouth every 4 (four) hours as needed (systolic BP >017PZWC).  (Patient not taking: No sig reported)     ibuprofen (ADVIL) 400 MG tablet Take 400 mg by mouth every 6 (six) hours as needed.     lactulose ([place])  10 GM/15ML solution Take by mouth 3 (three) times daily. (Patient not taking: No sig reported)     latanoprost (XALATAN) 0.005 % ophthalmic solution Place 1 drop into both eyes at bedtime. (Patient not taking: No sig reported)     mupirocin cream (BACTROBAN) 2 % Apply 1 application topically 2 (two) times daily. (Patient not taking: Reported on 09/23/2020) 30 g 0   naloxone (NARCAN) nasal spray 4 mg/0.1 mL Place 1 spray into the nose.     nitroGLYCERIN (NITROSTAT) 0.4 MG SL tablet Place 1 tablet (0.4 mg total) under the tongue every 5 (five) minutes as needed for chest pain. 30 tablet 6   oxycodone (OXY-IR) 5 MG capsule Take 5-10 mg by mouth every 4 (four) hours as needed for pain.  (Patient not taking: No sig reported)     sertraline (ZOLOFT) 50 MG tablet Take 150 mg by mouth at bedtime. (Patient not taking: Reported on 09/23/2020)     ticagrelor (BRILINTA) 90 MG TABS tablet Take 1 tablet (90 mg total) by mouth 2 (two) times daily. (Patient not taking: No sig reported) 60 tablet 0   Past Medical History:  Diagnosis Date   Bullous dermatosis    CAD (coronary artery disease)    a. 10/2018 Ant STEMI/PCI: LM nl, LAD 70p, 63m (3.5x16 Synergy DES and 3.0x28 Synergy DES), 50d, D1 95, D2 90, D3 70, LCX 50ost, OM2 60, lat OM2 90, RCA nl, RPDA 90. EF ~ 35%; b. 10/2018 Staged PCI of 90% RPDA (2.25x15 Resolute Onyx DES).   Dysphagia    ESRD (end stage renal disease) ([place])    FSGS (focal segmental glomerulosclerosis)    Hyperlipidemia    Ischemic cardiomyopathy    a. 10/2018 Echo: EF 50-55%, DD, apical  AK, mid to apical anteroseptal AK. Mild to mod LAE. Mild to mod AoV sclerosis w/o stenosis.   PAF (paroxysmal atrial fibrillation) ([place])    a. Noted briefly following MI-->oral amio.   Spinal stenosis, cervical region    Past Surgical History:  Procedure Laterality Date   CARPAL TUNNEL RELEASE     CORONARY STENT INTERVENTION N/A 11/01/2018   Procedure: CORONARY STENT INTERVENTION;  Surgeon: Nelva Bush, MD;  Location: [facility];  Service: Cardiovascular;  Laterality: N/A;   CORONARY/GRAFT ACUTE MI REVASCULARIZATION N/A 10/30/2018   Procedure: Coronary/Graft Acute MI Revascularization;  Surgeon: Nelva Bush, MD;  Location: [facility];  Service: Cardiovascular;  Laterality: N/A;   LEFT HEART CATH AND CORONARY ANGIOGRAPHY N/A 10/30/2018   Procedure: LEFT HEART CATH AND CORONARY ANGIOGRAPHY;  Surgeon: Nelva Bush, MD;  Location: [facility];  Service: Cardiovascular;  Laterality: N/A;   RIGHT/LEFT HEART CATH AND CORONARY ANGIOGRAPHY N/A 11/01/2018   Procedure: RIGHT/LEFT HEART CATH AND CORONARY ANGIOGRAPHY;  Surgeon: Nelva Bush, MD;  Location: [facility];  Service: Cardiovascular;  Laterality: N/A;   SPINAL FUSION  2018   spleen  2015   removed   Social History Social History   Tobacco Use   Smoking status: Never   Smokeless tobacco: Never  Vaping Use   Vaping Use: Never used  Substance Use Topics   Alcohol use: No   Drug use: No   Family History Family History  Problem Relation Age of Onset   Cancer Mother    COPD Mother    Heart disease Father    Parkinson's disease Father   Denies family history of peripheral artery disease, venous disease or renal disease.  Allergies  Allergen Reactions  Albumin (Human) Hives   Erythromycin Hives   Iodinated Diagnostic Agents Hives   Iron Dextran    Metoprolol Tartrate    Penicillins Swelling   Quinine Sulfate [Quinine]    Rabeprazole    Sulfa Antibiotics Other (See  Comments)   Tape Other (See Comments)    Removes skin.   Trimethoprim    Aloe    REVIEW OF SYSTEMS (Negative unless checked)  Constitutional: [] Weight loss  [] Fever  [] Chills Cardiac: [] Chest pain   [] Chest pressure   [] Palpitations   [] Shortness of breath when laying flat   [] Shortness of breath at rest   [] Shortness of breath with exertion. Vascular:  [] Pain in legs with walking   [] Pain in legs at rest   [] Pain in legs when laying flat   [] Claudication   [x] Pain in feet when walking  [x] Pain in feet at rest  [x] Pain in feet when laying flat   [] History of DVT   [] Phlebitis   [] Swelling in legs   [] Varicose veins   [x] Non-healing ulcers Pulmonary:   [] Uses home oxygen   [] Productive cough   [] Hemoptysis   [] Wheeze  [] COPD   [] Asthma Neurologic:  [] Dizziness  [] Blackouts   [] Seizures   [] History of stroke   [] History of TIA  [] Aphasia   [] Temporary blindness   [] Dysphagia   [] Weakness or numbness in arms   [] Weakness or numbness in legs Musculoskeletal:  [] Arthritis   [] Joint swelling   [] Joint pain   [] Low back pain Hematologic:  [] Easy bruising  [] Easy bleeding   [] Hypercoagulable state   [] Anemic  [] Hepatitis Gastrointestinal:  [] Blood in stool   [] Vomiting blood  [] Gastroesophageal reflux/heartburn   [] Difficulty swallowing. Genitourinary:  [x] Chronic kidney disease   [] Difficult urination  [] Frequent urination  [] Burning with urination   [] Blood in urine Skin:  [] Rashes   [x] Ulcers   [x] Wounds Psychological:  [] History of anxiety   []  History of major depression.  Physical Examination  Vitals:   09/23/20 0900 09/23/20 0930 09/23/20 1000 09/23/20 1030  BP: 117/75 101/63 92/63 (!) 87/54  Pulse: (!) 112 (!) 106 (!) 104 (!) 102  Resp: (!) 22 16 19  (!) 24  Temp:      TempSrc:      SpO2: 99% 95% 95% 93%  Weight:      Height:       Body mass index is 29.3 kg/m. Gen:  WD/WN, NAD Head: [place]/AT, No temporalis wasting. Prominent temp pulse not noted. Ear/Nose/Throat: Hearing grossly  intact, nares w/o erythema or drainage, oropharynx w/o Erythema/Exudate Eyes: Sclera non-icteric, conjunctiva clear Neck: Trachea midline.  No JVD.  Pulmonary:  Good air movement, respirations not labored, equal bilaterally.  Cardiac: RRR, normal S1, S2. Vascular:  Vessel Right Left  Radial Palpable Palpable  Ulnar Palpable Palpable  Brachial Palpable Palpable  Carotid Palpable, without bruit Palpable, without bruit  Aorta Not palpable N/A  Femoral Palpable Palpable  Popliteal Palpable Palpable  PT Non-Palpable Non-Palpable  DP Non-Palpable Non-Palpable   Left Lower Extremity: Thigh soft.  Calf soft.  Stasis dermatitis changes noted about the mid calf distally.?  Missing first toenail.  Hard to palpate pedal pulses.  Motor/sensory is intact.  Gastrointestinal: soft, non-tender/non-distended. No guarding/reflex.  Musculoskeletal: M/S 5/5 throughout.  Extremities without ischemic changes.  No deformity or atrophy. No edema. Neurologic: Sensation grossly intact in extremities.  Symmetrical.  Speech is fluent. Motor exam as listed above. Psychiatric: Judgment intact, Mood & affect appropriate for pt's clinical situation. Dermatologic: As above Lymph : No  Cervical, Axillary, or Inguinal lymphadenopathy.  CBC Lab Results  Component Value Date   WBC 17.9 (H) 09/23/2020   HGB 12.8 (L) 09/23/2020   HCT 39.1 09/23/2020   MCV 99.5 09/23/2020   PLT 298 09/23/2020   BMET    Component Value Date/Time   NA 135 09/23/2020 0845   NA 141 06/16/2013 2116   K 4.2 09/23/2020 0845   K 4.4 06/16/2013 2116   CL 92 (L) 09/23/2020 0845   CL 103 06/16/2013 2116   CO2 28 09/23/2020 0845   CO2 31 06/16/2013 2116   GLUCOSE 96 09/23/2020 0845   GLUCOSE 77 06/16/2013 2116   BUN 31 (H) 09/23/2020 0845   BUN 38 (H) 06/16/2013 2116   CREATININE 4.92 (H) 09/23/2020 0845   CREATININE 8.82 (H) 06/16/2013 2116   CALCIUM 9.1 09/23/2020 0845   CALCIUM 8.9 06/16/2013 2116   GFRNONAA 12 (L) 09/23/2020  0845   GFRNONAA 6 (L) 06/16/2013 2116   GFRAA 13 (L) 06/07/2019 0013   GFRAA 7 (L) 06/16/2013 2116   Estimated Creatinine Clearance: 16.6 mL/min (A) (by C-G formula based on SCr of 4.92 mg/dL (H)).  COAG Lab Results  Component Value Date   INR 1.0 09/23/2020   INR 1.1 06/07/2019   INR 1.1 10/31/2018   Radiology DG Chest Port 1 View  Result Date: 09/23/2020 CLINICAL DATA:  Suspect sepsis.  Complains of ongoing fever. EXAM: PORTABLE CHEST 1 VIEW COMPARISON:  06/07/2019 FINDINGS: Normal heart size. Pulmonary vascular congestion identified. Areas of platelike atelectasis noted within the left mid and left lower lung. No airspace consolidation identified. IMPRESSION: Left mid and lower lung platelike atelectasis. Electronically Signed   By: Kerby Moors M.D.   On: 09/23/2020 09:25    Assessment/Plan William Carpenter is a 65 year old male with past medical history of hyperlipidemia, CAD, paroxysmal atrial fibrillation, FSGS, and ESRD on HD (MWF) who presents to the ED complaining of fever.   1.  Peripheral Artery Disease: The patient has been under the care of the [facility] vascular surgeon.  The patient has known peripheral artery disease to the bilateral lower extremity.  The patient and his family member at the bedside state a recent angiogram and attempt to reestablish blood flow to the left lower extremity (a few weeks ago).  The patient denies any wound formation until recently. The patient was told he experienced an embolic event distally to the left lower extremity status post recent angiogram which caused his recent wound.  Patient is due to undergo either a TMA or below the knee amputation with his vascular surgeon at the [facility] in the near future.  I believe the patient's family when per will be attending this meeting tomorrow.  I had a long conversation with the patient and his 2 family members at the bedside in regard to possibly transitioning his care to our service.  We discussed undergoing  an angiogram of her wound and possible amputation in the future.  At this time, the patient and his family would like to continue the plan of care he was receiving at the [place].    The patient has tried to reach out to the [place] however due to lack of beds he is unable to be transferred at this time.  Family is willing to have the patient's sepsis treated here with ultimate plan for care to continue with his vascular surgeon at the [facility].  We will continue to follow the patient during his inpatient stay here.  We  are available if the patient changes his mind.  Patient will be admitted for IV antibiotics and pain control.  2. ESRD: Patient with known history of end-stage renal disease maintained on hemodialysis via a left upper extremity dialysis access. Patient denies any issues with the functioning of his access at this time Nephrology has been consulted for dialysis  3.  Hyperlipidemia: On Plavix and statin Encouraged good control as its slows the progression of atherosclerotic disease   Discussed with Dr. Mayme Genta, PA-C  09/23/2020 11:40 AM  This note was created with Dragon medical transcription system.  Any error is purely unintentional

## 2020-09-23 NOTE — Consult Note (Signed)
Pharmacy Antibiotic Note  William Carpenter is a 65 y.o. male admitted on 09/23/2020 with cellulitis.  Pharmacy has been consulted for Vancomycin dosing.  Plan: ESRD patient with HD on MWF. Next dialysis tomorrow 9/21 per neurologist note.  Loading dose 2000mg  given 9/20, then Vancomycin 1000mg  with dialysis. Plan to order vancomycin level as steady state.  Height: 5\' 9"  (175.3 cm) Weight: 90 kg (198 lb 6.6 oz) IBW/kg (Calculated) : 70.7  Temp (24hrs), Avg:99.6 F (37.6 C), Min:99 F (37.2 C), Max:100.2 F (37.9 C)  Recent Labs  Lab 09/23/20 0845  WBC 17.9*  CREATININE 4.92*  LATICACIDVEN 1.8    Estimated Creatinine Clearance: 16.6 mL/min (A) (by C-G formula based on SCr of 4.92 mg/dL (H)).    Allergies  Allergen Reactions   Albumin (Human) Hives   Erythromycin Hives   Iodinated Diagnostic Agents Hives   Iron Dextran    Metoprolol Tartrate    Penicillins Swelling   Quinine Sulfate [Quinine]    Rabeprazole    Sulfa Antibiotics Other (See Comments)   Tape Other (See Comments)    Removes skin.   Trimethoprim    Aloe      Antimicrobials this admission: 9/20 Cefepime >>  9/20 Vancomycin >>  9/20 Metronidazole >>     Microbiology results: cultures pending  Thank you for allowing pharmacy to be a part of this patient's care.  Zayanna Pundt Rodriguez-Guzman PharmD, BCPS 09/23/2020 5:39 PM

## 2020-09-23 NOTE — ED Notes (Signed)
[facility]  spoke  with  Hassan Rowan they  are  on  diversion   informed  Dr. Charna Archer  MD

## 2020-09-23 NOTE — ED Provider Notes (Signed)
[facility] Emergency Department Provider Note   ____________________________________________   Event Date/Time   First MD Initiated Contact with Patient 09/23/20 [phone]     (approximate)  I have reviewed the triage vital signs and the nursing notes.   HISTORY  Chief Complaint Fever    HPI William Carpenter is a 65 y.o. male with past medical history of hyperlipidemia, CAD, paroxysmal atrial fibrillation, FSGS, and ESRD on HD (MWF) who presents to the ED complaining of fever.  EMS reports that patient woke up this morning with shaking and chills, called his daughter to the room, who found his temperature to be 101.  Daughter had recently noticed increase swelling and redness to patient's left lower extremity, tracking up his shin from his left foot.  He was previously admitted 1 to 2 months ago at the [place] for sepsis related to cellulitis.  He currently takes doxycycline to prevent against recurrent cellulitis, has not missed any doses.  He denies any cough, chest pain, shortness of breath, abdominal pain, vomiting, or diarrhea.  He does not currently make urine.  He last underwent a full run of dialysis yesterday at the [place].        Past Medical History:  Diagnosis Date   Bullous dermatosis    CAD (coronary artery disease)    a. 10/2018 Ant STEMI/PCI: LM nl, LAD 70p, 71m (3.5x16 Synergy DES and 3.0x28 Synergy DES), 50d, D1 95, D2 90, D3 70, LCX 50ost, OM2 60, lat OM2 90, RCA nl, RPDA 90. EF ~ 35%; b. 10/2018 Staged PCI of 90% RPDA (2.25x15 Resolute Onyx DES).   Dysphagia    ESRD (end stage renal disease) ([place])    FSGS (focal segmental glomerulosclerosis)    Hyperlipidemia    Ischemic cardiomyopathy    a. 10/2018 Echo: EF 50-55%, DD, apical AK, mid to apical anteroseptal AK. Mild to mod LAE. Mild to mod AoV sclerosis w/o stenosis.   PAF (paroxysmal atrial fibrillation) ([place])    a. Noted briefly following MI-->oral amio.   Spinal stenosis, cervical region      Patient Active Problem List   Diagnosis Date Noted   Chest pain of uncertain etiology    Ischemic cardiomyopathy    STEMI (ST elevation myocardial infarction) ([place]) 10/30/2018   STEMI involving left anterior descending coronary artery ([place]) 10/30/2018   Pressure injury of skin 11/23/2017   ESRD on dialysis ([place]) 11/22/2017   HLD (hyperlipidemia) 11/22/2017   Intramuscular hematoma 11/22/2017   Acalculous cholecystitis    Leukocytosis 07/12/2016   Hypotension 07/12/2016    Past Surgical History:  Procedure Laterality Date   CARPAL TUNNEL RELEASE     CORONARY STENT INTERVENTION N/A 11/01/2018   Procedure: CORONARY STENT INTERVENTION;  Surgeon: Nelva Bush, MD;  Location: [place] [facility];  Service: Cardiovascular;  Laterality: N/A;   CORONARY/GRAFT ACUTE MI REVASCULARIZATION N/A 10/30/2018   Procedure: Coronary/Graft Acute MI Revascularization;  Surgeon: Nelva Bush, MD;  Location: [place] [facility];  Service: Cardiovascular;  Laterality: N/A;   LEFT HEART CATH AND CORONARY ANGIOGRAPHY N/A 10/30/2018   Procedure: LEFT HEART CATH AND CORONARY ANGIOGRAPHY;  Surgeon: Nelva Bush, MD;  Location: [place] [facility];  Service: Cardiovascular;  Laterality: N/A;   RIGHT/LEFT HEART CATH AND CORONARY ANGIOGRAPHY N/A 11/01/2018   Procedure: RIGHT/LEFT HEART CATH AND CORONARY ANGIOGRAPHY;  Surgeon: Nelva Bush, MD;  Location: [place] [facility];  Service: Cardiovascular;  Laterality: N/A;   SPINAL FUSION  2018   spleen  2015  removed    Prior to Admission medications   Medication Sig Start Date End Date Taking? Authorizing Provider  acetaminophen (TYLENOL) 325 MG tablet Take 650 mg by mouth every 8 (eight) hours as needed (pain or fever).    Yes [provider]  ALPRAZolam Duanne Moron) 0.5 MG tablet Take 0.5 mg by mouth 2 (two) times daily as needed for anxiety. 08/30/20  Yes [provider]  atorvastatin (LIPITOR) 40 MG tablet Take 20 mg by  mouth daily.    Yes [provider]  b complex-vitamin c-folic acid (NEPHRO-VITE) 0.8 MG TABS tablet Take 1 tablet by mouth at bedtime.   Yes [provider]  betamethasone dipropionate (DIPROLENE) 0.05 % ointment Apply topically 2 (two) times daily.   Yes [provider]  Buprenorphine HCl (BELBUCA) 450 MCG FILM Take 450 mcg by mouth every 12 (twelve) hours. 08/06/20 02/06/21 Yes [provider]  calcium acetate (PHOSLO) 667 MG capsule Take 1,334 mg by mouth 3 (three) times daily with meals. ON HOLD   Yes [provider]  carboxymethylcellulose (REFRESH PLUS) 0.5 % SOLN 1 drop every 2 (two) hours while awake.   Yes [provider]  cinacalcet (SENSIPAR) 60 MG tablet Take 60 mg by mouth daily.   Yes [provider]  clobetasol ointment (TEMOVATE) 1.49 % Apply 1 application topically 2 (two) times daily.   Yes [provider]  clopidogrel (PLAVIX) 75 MG tablet Take 75 mg by mouth daily.   Yes [provider]  cycloSPORINE (RESTASIS) 0.05 % ophthalmic emulsion Place 1 drop into both eyes 2 (two) times daily.   Yes [provider]  docusate sodium (COLACE) 100 MG capsule Take 100 mg by mouth daily.   Yes [provider]  doxycycline (MONODOX) 100 MG capsule Take 100 mg by mouth 2 (two) times daily.   Yes [provider]  DULoxetine (CYMBALTA) 30 MG capsule Take 30 mg by mouth daily.   Yes [provider]  fluticasone (FLONASE) 50 MCG/ACT nasal spray Place 2 sprays into both nostrils daily.   Yes [provider]  guaiFENesin (MUCINEX) 600 MG 12 hr tablet Take 600 mg by mouth 2 (two) times daily as needed for cough or to loosen phlegm.   Yes [provider]  hydrOXYzine (VISTARIL) 25 MG capsule Take 25 mg by mouth 3 (three) times daily as needed.   Yes [provider]  ipratropium (ATROVENT HFA) 17 MCG/ACT inhaler Inhale 2 puffs into the lungs every 6 (six) hours as  needed for wheezing.   Yes [provider]  ipratropium (ATROVENT) 0.03 % nasal spray Place 2 sprays into both nostrils 3 (three) times daily.   Yes [provider]  ketoconazole (NIZORAL) 2 % shampoo Apply 1 application topically 2 (two) times a week.   Yes [provider]  lactase (LACTAID) 3000 units tablet Take 3,000 Units by mouth 3 (three) times daily with meals.   Yes [provider]  lidocaine (LIDODERM) 5 % Place 1 patch onto the skin every 12 (twelve) hours. Remove & Discard patch within 12 hours or as directed by MD   Yes [provider]  lidocaine (XYLOCAINE) 5 % ointment Apply 1 application topically 2 (two) times daily as needed for mild pain.   Yes [provider]  loratadine (CLARITIN) 10 MG tablet Take 10 mg by mouth daily.   Yes [provider]  Melatonin 5 MG CAPS Take by mouth at bedtime.    Yes [provider]  midodrine (PROAMATINE) 5 MG tablet Take 15 mg by mouth 4 (four) times daily as needed.   Yes [provider]  mirtazapine (REMERON) 15 MG tablet Take 15 mg by mouth at bedtime.   Yes [provider]  omeprazole (PRILOSEC) 20 MG capsule Take 20 mg by mouth 2 (two) times daily before a meal.   Yes [provider]  polyvinyl alcohol (LIQUIFILM TEARS) 1.4 % ophthalmic solution Place 1 drop into both eyes 4 (four) times daily as needed for dry eyes.   Yes [provider]  pregabalin (LYRICA) 50 MG capsule Take 50 mg by mouth daily. 08/06/20  Yes [provider]  senna-docusate (SENOKOT-S) 8.6-50 MG tablet Take 1 tablet by mouth 2 (two) times daily as needed (to prevent constipation; scheduled until bowel movement).    Yes [provider]  simethicone (MYLICON) 40 KP/5.4SF drops Take 40 mg by mouth 4 (four) times daily as needed for flatulence.   Yes [provider]  traZODone (DESYREL) 100 MG tablet Take 100 mg by mouth at bedtime as needed for  sleep.   Yes [provider]  Vitamin D, Ergocalciferol, (DRISDOL) 1.25 MG (50000 UNIT) CAPS capsule Take 50,000 Units by mouth once a week. 08/06/20  Yes [provider]  amiodarone (PACERONE) 200 MG tablet Take 1 tablet (200 mg total) by mouth daily. 11/23/18 12/23/18  Theora Gianotti, NP  cetirizine (ZYRTEC) 10 MG tablet Take 10 mg by mouth daily. Patient not taking: Reported on 09/23/2020    [provider]  cinacalcet (SENSIPAR) 90 MG tablet Take 90 mg by mouth daily. ON HOLD Patient not taking: No sig reported    [provider]  dorzolamidel-timolol (COSOPT) 22.3-6.8 MG/ML SOLN ophthalmic solution 1 drop 2 (two) times daily. Patient not taking: Reported on 09/23/2020    [provider]  hydrALAZINE (APRESOLINE) 10 MG tablet Take 10 mg by mouth every 4 (four) hours as needed (systolic BP >681EXNT).  Patient not taking: No sig reported    [provider]  ibuprofen (ADVIL) 400 MG tablet Take 400 mg by mouth every 6 (six) hours as needed. 06/20/20   [provider]  lactulose (CHRONULAC) 10 GM/15ML solution Take by mouth 3 (three) times daily. Patient not taking: No sig reported    [provider]  latanoprost (XALATAN) 0.005 % ophthalmic solution Place 1 drop into both eyes at bedtime. Patient not taking: No sig reported    [provider]  mupirocin cream (BACTROBAN) 2 % Apply 1 application topically 2 (two) times daily. Patient not taking: Reported on 09/23/2020 09/30/16   Frederich Cha, MD  naloxone [facility]) nasal spray 4 mg/0.1 mL Place 1 spray into the nose.    [provider]  nitroGLYCERIN (NITROSTAT) 0.4 MG SL tablet Place 1 tablet (0.4 mg total) under the tongue every 5 (five) minutes as needed for chest pain. 11/23/18 02/21/19  Theora Gianotti, NP  oxycodone (OXY-IR) 5 MG capsule Take 5-10 mg by mouth every 4 (four) hours as needed for pain.  Patient not taking: No sig reported     [provider]  sertraline (ZOLOFT) 50 MG tablet Take 150 mg by mouth at bedtime. Patient not taking: Reported on 09/23/2020    [provider]  ticagrelor (BRILINTA) 90 MG TABS tablet Take 1 tablet (90 mg total) by mouth 2 (two) times daily. Patient not taking: No sig reported 11/07/18   Marcell Anger, MD    Allergies Albumin (human),  Erythromycin, Iodinated diagnostic agents, Iron dextran, Metoprolol tartrate, Penicillins, Quinine sulfate [quinine], Rabeprazole, Sulfa antibiotics, Tape, Trimethoprim, and Aloe  Family History  Problem Relation Age of Onset   Cancer Mother    COPD Mother    Heart disease Father    Parkinson's disease Father     Social History Social History   Tobacco Use   Smoking status: Never   Smokeless tobacco: Never  Vaping Use   Vaping Use: Never used  Substance Use Topics   Alcohol use: No   Drug use: No    Review of Systems  Constitutional: Positive for fever/chills. Eyes: No visual changes. ENT: No sore throat. Cardiovascular: Denies chest pain. Respiratory: Denies shortness of breath. Gastrointestinal: No abdominal pain.  No nausea, no vomiting.  No diarrhea.  No constipation. Genitourinary: Negative for dysuria. Musculoskeletal: Negative for back pain.  Positive for left leg redness and swelling. Skin: Negative for rash. Neurological: Negative for headaches, focal weakness or numbness.  ____________________________________________   PHYSICAL EXAM:  VITAL SIGNS: ED Triage Vitals  Enc Vitals Group     BP 09/23/20 0828 120/75     Pulse Rate 09/23/20 0828 (!) 113     Resp 09/23/20 0828 20     Temp 09/23/20 0828 100.2 F (37.9 C)     Temp Source 09/23/20 0828 Oral     SpO2 09/23/20 0828 95 %     Weight --      Height --      Head Circumference --      Peak Flow --      Pain Score 09/23/20 0831 3     Pain Loc --      Pain Edu? --      Excl. in [place]? --     Constitutional: Alert and oriented.   Ill-appearing and diaphoretic. Eyes: Conjunctivae are normal. Head: Atraumatic. Nose: No congestion/rhinnorhea. Mouth/Throat: Mucous membranes are moist. Neck: Normal ROM Cardiovascular: Tachycardic, regular rhythm. Grossly normal heart sounds.  Left upper extremity AV fistula with palpable thrill. Respiratory: Normal respiratory effort.  No retractions. Lungs CTAB. Gastrointestinal: Soft and nontender. No distention. Genitourinary: deferred Musculoskeletal: Erythema and warmth extending from left foot up to left mid shin with associated tenderness but no fluctuance or induration. Neurologic:  Normal speech and language. No gross focal neurologic deficits are appreciated. Skin:  Skin is warm, dry and intact. No rash noted. Psychiatric: Mood and affect are normal. Speech and behavior are normal.  ____________________________________________   LABS (all labs ordered are listed, but only abnormal results are displayed)  Labs Reviewed  COMPREHENSIVE METABOLIC PANEL - Abnormal; Notable for the following components:      Result Value   Chloride 92 (*)    BUN 31 (*)    Creatinine, Ser 4.92 (*)    Total Protein 9.2 (*)    Albumin 3.4 (*)    Alkaline Phosphatase 158 (*)    GFR, Estimated 12 (*)    All other components within normal limits  CBC WITH DIFFERENTIAL/PLATELET - Abnormal; Notable for the following components:   WBC 17.9 (*)    RBC 3.93 (*)    Hemoglobin 12.8 (*)    RDW 16.9 (*)    Neutro Abs 13.2 (*)    Monocytes Absolute 1.1 (*)    All other components within normal limits  RESP PANEL BY RT-PCR (FLU A&B, COVID) ARPGX2  CULTURE, BLOOD (ROUTINE X 2)  CULTURE, BLOOD (ROUTINE X 2)  URINE CULTURE  LACTIC ACID, PLASMA  PROTIME-INR  APTT  URINALYSIS, COMPLETE (UACMP) WITH MICROSCOPIC  TROPONIN I (HIGH SENSITIVITY)   ____________________________________________  EKG  ED ECG REPORT I, Blake Divine, the attending physician, personally viewed and interpreted this  ECG.   Date: 09/23/2020  EKG Time: 8:35  Rate: 111  Rhythm: sinus tachycardia  Axis: Normal  Intervals:none  ST&T Change: None   PROCEDURES  Procedure(s) performed (including Critical Care):  .Critical Care Performed by: Blake Divine, MD Authorized by: Blake Divine, MD   Critical care provider statement:    Critical care time (minutes):  45   Critical care time was exclusive of:  Separately billable procedures and treating other patients and teaching time   Critical care was necessary to treat or prevent imminent or life-threatening deterioration of the following conditions:  Sepsis   Critical care was time spent personally by me on the following activities:  Discussions with consultants, evaluation of patient's response to treatment, examination of patient, ordering and performing treatments and interventions, ordering and review of laboratory studies, ordering and review of radiographic studies, pulse oximetry, re-evaluation of patient's condition, obtaining history from patient or surrogate and review of old charts   I assumed direction of critical care for this patient from another provider in my specialty: no     Care discussed with: admitting provider     ____________________________________________   INITIAL IMPRESSION / ASSESSMENT AND PLAN / ED COURSE      65 year old male with past medical history of hyperlipidemia, CAD, FSGS, ESRD on HD (MWF) atrial fibrillation, and chronic hypotension on midodrine who presents to the ED complaining of fever and chills starting this morning after recent redness developing in his left lower extremity.  Patient's presentation is concerning for sepsis given his fever this morning and tachycardia here in the ED.  Sepsis work-up started and we will treat with broad-spectrum antibiotics, most likely source appears to be at recurrent left lower extremity cellulitis.  We will hydrate with small IV fluid bolus and give patient's usual dose  of midodrine, which he has not yet had this morning.  Chest x-ray reviewed by me and shows no infiltrate, edema, or effusion.  Labs are reassuring at this time.  I reached out to the [place] for possible transfer, however they are on complete diversion at this time.  Case discussed with hospitalist for admission here at [facility].      ____________________________________________   FINAL CLINICAL IMPRESSION(S) / ED DIAGNOSES  Final diagnoses:  Sepsis without acute organ dysfunction, due to unspecified organism [facility])  Left leg cellulitis     ED Discharge Orders     None        Note:  This document was prepared using Dragon voice recognition software and may include unintentional dictation errors.    Blake Divine, MD 09/23/20 1051

## 2020-09-23 NOTE — ED Triage Notes (Signed)
Pt here from home with a fever. Pt called out screaming to his daughter this morning behaving abnormal. Pt is a dialysis pt (MWF) with a left side fistula. Pt has cellulitis in his left foot and had a hx of sepsis in Aug with an hospital admission. Pt has hx of afib. Pt was given 975mg  of Tylenol by ems.

## 2020-09-23 NOTE — Progress Notes (Addendum)
Pharmacy Antibiotic Note  William Carpenter is a 65 y.o. male admitted on 09/23/2020 with cellulitis.  Pharmacy has been consulted for Cefepime dosing.  Plan: Cefepime 1gm every 24 hours  for ESRD (HD MWF)  Height: 5\' 9"  (175.3 cm) Weight: 90 kg (198 lb 6.6 oz) IBW/kg (Calculated) : 70.7  Temp (24hrs), Avg:100.2 F (37.9 C), Min:100.2 F (37.9 C), Max:100.2 F (37.9 C)  Recent Labs  Lab 09/23/20 0845  WBC 17.9*  CREATININE 4.92*  LATICACIDVEN 1.8    Estimated Creatinine Clearance: 16.6 mL/min (A) (by C-G formula based on SCr of 4.92 mg/dL (H)).    Allergies  Allergen Reactions   Albumin (Human) Hives   Erythromycin Hives   Iodinated Diagnostic Agents Hives   Iron Dextran    Metoprolol Tartrate    Penicillins Swelling   Quinine Sulfate [Quinine]    Rabeprazole    Sulfa Antibiotics Other (See Comments)   Tape Other (See Comments)    Removes skin.   Trimethoprim    Aloe     Antimicrobials this admission: 9/20 Cefepime >>  9/20 Vancomycin >>  9/20 Metronidazole x1   Microbiology results: cultures pending  Thank you for allowing pharmacy to be a part of this patient's care.  William Carpenter PharmD, BCPS 09/23/2020 3:24 PM

## 2020-09-24 DIAGNOSIS — N186 End stage renal disease: Secondary | ICD-10-CM | POA: Diagnosis not present

## 2020-09-24 DIAGNOSIS — I480 Paroxysmal atrial fibrillation: Secondary | ICD-10-CM

## 2020-09-24 DIAGNOSIS — A419 Sepsis, unspecified organism: Secondary | ICD-10-CM | POA: Diagnosis not present

## 2020-09-24 DIAGNOSIS — L03116 Cellulitis of left lower limb: Secondary | ICD-10-CM | POA: Diagnosis not present

## 2020-09-24 LAB — BASIC METABOLIC PANEL
Anion gap: 13 (ref 5–15)
BUN: 45 mg/dL — ABNORMAL HIGH (ref 8–23)
CO2: 28 mmol/L (ref 22–32)
Calcium: 8.8 mg/dL — ABNORMAL LOW (ref 8.9–10.3)
Chloride: 92 mmol/L — ABNORMAL LOW (ref 98–111)
Creatinine, Ser: 6.22 mg/dL — ABNORMAL HIGH (ref 0.61–1.24)
GFR, Estimated: 9 mL/min — ABNORMAL LOW (ref >60)
Glucose, Bld: 92 mg/dL (ref 70–99)
Potassium: 4.3 mmol/L (ref 3.5–5.1)
Sodium: 133 mmol/L — ABNORMAL LOW (ref 135–145)

## 2020-09-24 LAB — CBC
HCT: 33.7 % — ABNORMAL LOW (ref 39.0–52.0)
Hemoglobin: 11.4 g/dL — ABNORMAL LOW (ref 13.0–17.0)
MCH: 33.7 pg (ref 26.0–34.0)
MCHC: 33.8 g/dL (ref 30.0–36.0)
MCV: 99.7 fL (ref 80.0–100.0)
Platelets: 247 10*3/uL (ref 150–400)
RBC: 3.38 MIL/uL — ABNORMAL LOW (ref 4.22–5.81)
RDW: 16.4 % — ABNORMAL HIGH (ref 11.5–15.5)
WBC: 16.9 10*3/uL — ABNORMAL HIGH (ref 4.0–10.5)
nRBC: 0.1 % (ref 0.0–0.2)

## 2020-09-24 LAB — HEPATITIS B SURFACE ANTIBODY,QUALITATIVE: Hep B S Ab: REACTIVE — AB

## 2020-09-24 LAB — PROCALCITONIN: Procalcitonin: 6.6 ng/mL

## 2020-09-24 LAB — PROTIME-INR
INR: 1.2 (ref 0.8–1.2)
Prothrombin Time: 15.4 seconds — ABNORMAL HIGH (ref 11.4–15.2)

## 2020-09-24 LAB — CORTISOL-AM, BLOOD: Cortisol - AM: 13.8 ug/dL (ref 6.7–22.6)

## 2020-09-24 LAB — HEPATITIS B SURFACE ANTIGEN: Hepatitis B Surface Ag: NONREACTIVE

## 2020-09-24 MED ORDER — LIDOCAINE HCL (PF) 1 % IJ SOLN
5.0000 mL | INTRAMUSCULAR | Status: DC | PRN
  Filled 2020-09-24: qty 5

## 2020-09-24 MED ORDER — PENTAFLUOROPROP-TETRAFLUOROETH EX AERO
1.0000 "application " | INHALATION_SPRAY | CUTANEOUS | Status: DC | PRN
  Filled 2020-09-24: qty 30

## 2020-09-24 MED ORDER — LIDOCAINE-PRILOCAINE 2.5-2.5 % EX CREA
1.0000 "application " | TOPICAL_CREAM | CUTANEOUS | Status: DC | PRN
  Filled 2020-09-24 (×2): qty 5

## 2020-09-24 MED ORDER — HEPARIN SODIUM (PORCINE) 1000 UNIT/ML DIALYSIS
1000.0000 [IU] | INTRAMUSCULAR | Status: DC | PRN
  Filled 2020-09-24: qty 1

## 2020-09-24 MED ORDER — SODIUM CHLORIDE 0.9 % IV SOLN: 100.0000 mL | INTRAVENOUS | Status: DC | PRN

## 2020-09-24 MED ORDER — ALTEPLASE 2 MG IJ SOLR
2.0000 mg | Freq: Once | INTRAMUSCULAR | Status: DC | PRN
Start: 2020-09-24 — End: 2020-09-26
  Filled 2020-09-24: qty 2

## 2020-09-24 NOTE — ED Notes (Signed)
Pt taken to dialysis at this time.

## 2020-09-24 NOTE — Progress Notes (Signed)
Pt with LUA AVF cannulated without difficulty, completed tx no complications, fluid removal 0.5 liter. VSS AVF with thrill and bruit present. No concerns, will return to ED.

## 2020-09-24 NOTE — ED Notes (Signed)
BP 88/60(70,) MD notified.

## 2020-09-24 NOTE — Progress Notes (Addendum)
PROGRESS NOTE    William Carpenter   KJI:[id number]  DOB: 1955/11/03  PCP: Raelyn Mora, MD    DOA: 09/23/2020 LOS: 1    Brief Narrative / Hospital Course to Date:   65 year old male with past medical history of ESRD on dialysis MWF, paroxysmal A. fib, hyperlipidemia, CAD, PAD who follows at the door [facility], presenting to the ER via EMS due to fever and shaking changes.  Patient was recently admitted at the [facility] for 3 weeks with sepsis secondary to left lower extremity cellulitis and discharged on doxycycline as prophylaxis for recurrence.  T-max at home 101F.  Patient noted progressive redness and swelling of extremity.  Admitted to [facility] service and started on IV antibiotics with vascular surgery consulted.  Patient and family prefer any surgical intervention to be performed at the [place].  Therefore being treated conservatively here with IV antibiotics.  Assessment & Plan   Principal Problem:   Sepsis ([place]) Active Problems:   ESRD on dialysis ([place])   Ischemic cardiomyopathy   PAF (paroxysmal atrial fibrillation) (HCC)   Cellulitis   PAD (peripheral artery disease) ([place])   Sepsis due to left lower extremity cellulitis, recurrent in the setting of severe peripheral artery disease -patient met sepsis criteria with fever, tachycardia, leukocytosis in the setting of cellulitis.  She left lower extremity Doppler negative for DVT. --Continue IV antibiotics --Elevate left lower extremity --Follow blood cultures --Vascular surgery following, follow-up recommendations  History of paroxysmal A. Fib -currently rate controlled.  Continue amiodarone.  Patient not on long-term anticoagulation due to increased risk of bleeding. --Monitor telemetry  ESRD -on hemodialysis Monday Wednesday Friday.  Nephrology following.  Monitor.  Anxiety/depression -continue home Xanax, Remeron, Cymbalta and trazodone  Patient BMI: Body mass index is 29.3 kg/m.   DVT prophylaxis: heparin  injection 5,000 Units Start: 09/23/20 1400   Diet:  Diet Orders (From admission, onward)     Start     Ordered   09/23/20 1147  Diet renal with fluid restriction Fluid restriction: 1200 mL Fluid; Room service appropriate? Yes; Fluid consistency: Thin  Diet effective now       Question Answer Comment  Fluid restriction: 1200 mL Fluid   Room service appropriate? Yes   Fluid consistency: Thin      09/23/20 1149              Code Status: DNR   Subjective 09/24/20    Patient seen this morning in the ED holding for bed.  He reports intermittently having the chills overnight and his morning.  States he does have dialysis today.  Currently no other acute complaints and states he feels awful.   Disposition Plan & Communication   Status is: Inpatient  Remains inpatient appropriate because:IV treatments appropriate due to intensity of illness or inability to take PO  Dispo: The patient is from: Home              Anticipated d/c is to: Home              Patient currently is not medically stable to d/c.   Difficult to place patient No  \   Consults, Procedures, Significant Events   Consultants:  Vascular surgery Nephrology  Procedures:  Hemodialysis  Antimicrobials:  Anti-infectives (From admission, onward)    Start     Dose/Rate Route Frequency Ordered Stop   09/24/20 1200  vancomycin (VANCOCIN) IVPB 1000 mg/200 mL premix  Status:  Discontinued  1,000 mg 200 mL/hr over 60 Minutes Intravenous Every M-W-F (Hemodialysis) 09/23/20 1741 09/23/20 1743   09/24/20 1200  vancomycin (VANCOCIN) IVPB 1000 mg/200 mL premix        1,000 mg 200 mL/hr over 60 Minutes Intravenous Every M-W-F (Hemodialysis) 09/23/20 1743 10/01/20 1159   09/24/20 0800  ceFEPIme (MAXIPIME) 2 g in sodium chloride 0.9 % 100 mL IVPB  Status:  Discontinued        2 g 200 mL/hr over 30 Minutes Intravenous Every 24 hours 09/23/20 1530 09/23/20 1607   09/24/20 0800  ceFEPIme (MAXIPIME) 1 g in sodium  chloride 0.9 % 100 mL IVPB        1 g 200 mL/hr over 30 Minutes Intravenous Every 24 hours 09/23/20 1607 10/01/20 0759   09/23/20 2100  metroNIDAZOLE (FLAGYL) IVPB 500 mg        500 mg 100 mL/hr over 60 Minutes Intravenous Every 12 hours 09/23/20 1149 09/30/20 2059   09/23/20 1530  vancomycin (VANCOCIN) IVPB 1000 mg/200 mL premix        1,000 mg 200 mL/hr over 60 Minutes Intravenous  Once 09/23/20 1518 09/24/20 1330   09/23/20 1200  ceFEPIme (MAXIPIME) 2 g in sodium chloride 0.9 % 100 mL IVPB  Status:  Discontinued        2 g 200 mL/hr over 30 Minutes Intravenous  Once 09/23/20 1149 09/23/20 1156   09/23/20 1200  vancomycin (VANCOCIN) IVPB 1000 mg/200 mL premix  Status:  Discontinued        1,000 mg 200 mL/hr over 60 Minutes Intravenous  Once 09/23/20 1149 09/23/20 1225   09/23/20 0900  ceFEPIme (MAXIPIME) 2 g in sodium chloride 0.9 % 100 mL IVPB        2 g 200 mL/hr over 30 Minutes Intravenous  Once 09/23/20 0850 09/23/20 0930   09/23/20 0900  metroNIDAZOLE (FLAGYL) IVPB 500 mg        500 mg 100 mL/hr over 60 Minutes Intravenous  Once 09/23/20 0850 09/23/20 1035   09/23/20 0900  vancomycin (VANCOCIN) IVPB 1000 mg/200 mL premix        1,000 mg 200 mL/hr over 60 Minutes Intravenous  Once 09/23/20 0850 09/23/20 1035         Micro    Objective   Vitals:   09/24/20 1600 09/24/20 1615 09/24/20 1700 09/24/20 1838  BP: 103/74 (!) 118/93 101/79 112/79  Pulse: 99 100 (!) 101 95  Resp: _0 Temp:      TempSrc:      SpO2:    99%  Weight:      Height:        Intake/Output Summary (Last 24 hours) at 09/24/2020 1912 Last data filed at 09/24/2020 1700 Gross per 24 hour  Intake 3 ml  Output 500 ml  Net -497 ml   Filed Weights   09/23/20 0834  Weight: 90 kg    Physical Exam:  General exam: awake with eyes closed, no acute distress HEENT: moist mucus membranes, hearing grossly normal  Respiratory system: CTAB, no wheezes, rales or rhonchi, normal respiratory  effort. Cardiovascular system: normal S1/S2, RRR.   Gastrointestinal system: Abdomen soft and nontender., ND, no HSM felt, +bowel sounds. Central nervous system: A&O x3. no gross focal neurologic deficits, normal speech Extremities: Left lower extremity with edema, warmth and dark erythema, normal tone Psychiatry: normal mood, congruent affect, judgement and insight appear normal  Labs   Data Reviewed: I have personally reviewed following labs and  imaging studies  CBC: Recent Labs  Lab 09/23/20 0845 09/24/20 0436  WBC 17.9* 16.9*  NEUTROABS 13.2*  --   HGB 12.8* 11.4*  HCT 39.1 33.7*  MCV 99.5 99.7  PLT 298 893   Basic Metabolic Panel: Recent Labs  Lab 09/23/20 0845 09/24/20 0436  NA 135 133*  K 4.2 4.3  CL 92* 92*  CO2 28 28  GLUCOSE 96 92  BUN 31* 45*  CREATININE 4.92* 6.22*  CALCIUM 9.1 8.8*   GFR: Estimated Creatinine Clearance: 13.1 mL/min (A) (by C-G formula based on SCr of 6.22 mg/dL (H)). Liver Function Tests: Recent Labs  Lab 09/23/20 0845  AST 28  ALT 16  ALKPHOS 158*  BILITOT 1.1  PROT 9.2*  ALBUMIN 3.4*   No results for input(s): LIPASE, AMYLASE in the last 168 hours. No results for input(s): AMMONIA in the last 168 hours. Coagulation Profile: Recent Labs  Lab 09/23/20 0845 09/24/20 0436  INR 1.0 1.2   Cardiac Enzymes: No results for input(s): CKTOTAL, CKMB, CKMBINDEX, TROPONINI in the last 168 hours. BNP (last 3 results) No results for input(s): PROBNP in the last 8760 hours. HbA1C: No results for input(s): HGBA1C in the last 72 hours. CBG: No results for input(s): GLUCAP in the last 168 hours. Lipid Profile: No results for input(s): CHOL, HDL, LDLCALC, TRIG, CHOLHDL, LDLDIRECT in the last 72 hours. Thyroid Function Tests: No results for input(s): TSH, T4TOTAL, FREET4, T3FREE, THYROIDAB in the last 72 hours. Anemia Panel: No results for input(s): VITAMINB12, FOLATE, FERRITIN, TIBC, IRON, RETICCTPCT in the last 72 hours. Sepsis  Labs: Recent Labs  Lab 09/23/20 0845 09/24/20 0436  PROCALCITON  --  6.60  LATICACIDVEN 1.8  --     Recent Results (from the past 240 hour(s))  Resp Panel by RT-PCR (Flu A&B, Covid) Nasopharyngeal Swab     Status: None   Collection Time: 09/23/20  8:45 AM   Specimen: Nasopharyngeal Swab; Nasopharyngeal(NP) swabs in vial transport medium  Result Value Ref Range Status   SARS Coronavirus 2 by RT PCR NEGATIVE NEGATIVE Final    Comment: (NOTE) SARS-CoV-2 target nucleic acids are NOT DETECTED.  The SARS-CoV-2 RNA is generally detectable in upper respiratory specimens during the acute phase of infection. The lowest concentration of SARS-CoV-2 viral copies this assay can detect is 138 copies/mL. A negative result does not preclude SARS-Cov-2 infection and should not be used as the sole basis for treatment or other patient management decisions. A negative result may occur with  improper specimen collection/handling, submission of specimen other than nasopharyngeal swab, presence of viral mutation(s) within the areas targeted by this assay, and inadequate number of viral copies(<138 copies/mL). A negative result must be combined with clinical observations, patient history, and epidemiological information. The expected result is Negative.  Fact Sheet for Patients:  EntrepreneurPulse.com.au  Fact Sheet for Healthcare Providers:  IncredibleEmployment.be  This test is no t yet approved or cleared by the [place] FDA and  has been authorized for detection and/or diagnosis of SARS-CoV-2 by FDA under an Emergency Use Authorization (EUA). This EUA will remain  in effect (meaning this test can be used) for the duration of the COVID-19 declaration under Section 564(b)(1) of the Act, 21 U.S.C.section 360bbb-3(b)(1), unless the authorization is terminated  or revoked sooner.       Influenza A by PCR NEGATIVE NEGATIVE Final   Influenza B by PCR  NEGATIVE NEGATIVE Final    Comment: (NOTE) The Xpert Xpress SARS-CoV-2/FLU/RSV plus assay is intended as  an aid in the diagnosis of influenza from Nasopharyngeal swab specimens and should not be used as a sole basis for treatment. Nasal washings and aspirates are unacceptable for Xpert Xpress SARS-CoV-2/FLU/RSV testing.  Fact Sheet for Patients: EntrepreneurPulse.com.au  Fact Sheet for Healthcare Providers: IncredibleEmployment.be  This test is not yet approved or cleared by the [place] FDA and has been authorized for detection and/or diagnosis of SARS-CoV-2 by FDA under an Emergency Use Authorization (EUA). This EUA will remain in effect (meaning this test can be used) for the duration of the COVID-19 declaration under Section 564(b)(1) of the Act, 21 U.S.C. section 360bbb-3(b)(1), unless the authorization is terminated or revoked.  Performed at [facility], [address] [place], [place] [address]   Blood Culture (routine x 2)     Status: None (Preliminary result)   Collection Time: 09/23/20  8:45 AM   Specimen: BLOOD  Result Value Ref Range Status   Specimen Description BLOOD RIGHT [facility]  Final   Special Requests   Final    BOTTLES DRAWN AEROBIC AND ANAEROBIC Blood Culture adequate volume   Culture   Final    NO GROWTH < 24 HOURS Performed at [facility], [address], [place], [place] [address]    Report Status PENDING  Incomplete  Blood Culture (routine x 2)     Status: None (Preliminary result)   Collection Time: 09/23/20  8:45 AM   Specimen: BLOOD  Result Value Ref Range Status   Specimen Description BLOOD RIGHT FA  Final   Special Requests   Final    BOTTLES DRAWN AEROBIC AND ANAEROBIC Blood Culture adequate volume   Culture   Final    NO GROWTH < 24 HOURS Performed at [facility], [address] [place], [place] [address]    Report Status PENDING  Incomplete      Imaging Studies    US Venous Img Lower Unilateral Left (DVT)  Result Date: 09/23/2020 CLINICAL DATA:  Left lower extremity pain and edema. History of malignancy. Evaluate for DVT. EXAM: LEFT LOWER EXTREMITY VENOUS DOPPLER ULTRASOUND TECHNIQUE: Gray-scale sonography with graded compression, as well as color Doppler and duplex ultrasound were performed to evaluate the lower extremity deep venous systems from the level of the common femoral vein and including the common femoral, femoral, profunda femoral, popliteal and calf veins including the posterior tibial, peroneal and gastrocnemius veins when visible. The superficial great saphenous vein was also interrogated. Spectral Doppler was utilized to evaluate flow at rest and with distal augmentation maneuvers in the common femoral, femoral and popliteal veins. COMPARISON:  None. FINDINGS: Contralateral Common Femoral Vein: Respiratory phasicity is normal and symmetric with the symptomatic side. No evidence of thrombus. Normal compressibility. Common Femoral Vein: No evidence of thrombus. Normal compressibility, respiratory phasicity and response to augmentation. Saphenofemoral Junction: No evidence of thrombus. Normal compressibility and flow on color Doppler imaging. Profunda Femoral Vein: No evidence of thrombus. Normal compressibility and flow on color Doppler imaging. Femoral Vein: No evidence of thrombus. Normal compressibility, respiratory phasicity and response to augmentation. Popliteal Vein: No evidence of thrombus. Normal compressibility, respiratory phasicity and response to augmentation. Calf Veins: No evidence of thrombus. Normal compressibility and flow on color Doppler imaging. Superficial Great Saphenous Vein: No evidence of thrombus. Normal compressibility. Other Findings:  None. IMPRESSION: No evidence of DVT within the left lower extremity. Electronically Signed   By: Sandi Mariscal M.D.   On: 09/23/2020 13:51   DG Chest [facility] 1 View  Result  Date:  09/23/2020 CLINICAL DATA:  Suspect sepsis.  Complains of ongoing fever. EXAM: PORTABLE CHEST 1 VIEW COMPARISON:  06/07/2019 FINDINGS: Normal heart size. Pulmonary vascular congestion identified. Areas of platelike atelectasis noted within the left mid and left lower lung. No airspace consolidation identified. IMPRESSION: Left mid and lower lung platelike atelectasis. Electronically Signed   By: Kerby Moors M.D.   On: 09/23/2020 09:25     Medications   Scheduled Meds:  amiodarone  200 mg Oral Daily   atorvastatin  20 mg Oral Daily   Buprenorphine HCl  150 mcg Buccal BID   And   Buprenorphine HCl  300 mcg Buccal BID   calcium acetate  1,334 mg Oral TID WC   Chlorhexidine Gluconate Cloth  6 each Topical Q0600   cinacalcet  60 mg Oral Q breakfast   clopidogrel  75 mg Oral Daily   cycloSPORINE  1 drop Both Eyes BID   docusate sodium  100 mg Oral Daily   DULoxetine  30 mg Oral Daily   heparin  5,000 Units Subcutaneous Q8H   lactase  3,000 Units Oral TID WC   lidocaine  1 patch Transdermal Q12H   loratadine  10 mg Oral Daily   melatonin  5 mg Oral QHS   midodrine  15 mg Oral TID WC   mirtazapine  15 mg Oral QHS   multivitamin  1 tablet Oral QHS   pantoprazole  40 mg Oral Daily   pregabalin  50 mg Oral Daily   sodium chloride flush  3 mL Intravenous Q12H   sodium chloride flush  3 mL Intravenous Q12H   [START ON 09/26/2020] Vitamin D (Ergocalciferol)  50,000 Units Oral Weekly   Continuous Infusions:  sodium chloride     sodium chloride     sodium chloride     ceFEPime (MAXIPIME) IV Stopped (09/24/20 0906)   metronidazole Stopped (09/24/20 1202)   vancomycin 1,000 mg (09/24/20 1223)       LOS: 1 day    Time spent: 30 minutes    Ezekiel Slocumb, DO [facility]  09/24/2020, 7:12 PM      If 7PM-7AM, please contact night-coverage. How to contact the [facility] Attending or Consulting provider [place] or covering provider during after hours [place], for this patient?     Check the care team in [facility] and look for a) attending/consulting [facility] provider listed and b) the [facility] team listed Log into [email or web address] and use [place]'s universal password to access. If you do not have the password, please contact the hospital operator. Locate the [facility] provider you are looking for under [facility] and page to a number that you can be directly reached. If you still have difficulty reaching the provider, please page the [facility] (Director on Call) for the Hospitalists listed on amion for assistance.

## 2020-09-24 NOTE — ED Notes (Signed)
Pt back from dialysis at this time.

## 2020-09-24 NOTE — ED Notes (Signed)
Brennan Bailey, RN notified of handoff at this time

## 2020-09-24 NOTE — Plan of Care (Signed)

## 2020-09-24 NOTE — Progress Notes (Signed)
[facility]  ROUNDING NOTE   Subjective:   William Carpenter is a 65 year old male with previous medical conditions including dyslipidemia, coronary artery disease, peripheral artery disease, paroxysmal atrial fibrillation, and end-stage renal disease on dialysis.  Patient presents to the ED with complaints of fever and shaking chills.  He is accompanied to the ED with his daughter and personal aide.  Patient will be admitted to [facility] for Sepsis [facility]) [A41.9]  Patient is known to our clinic from previous hospitalizations.  He currently receives outpatient dialysis treatments at the [facility] clinic in [facility].  We have been consulted to manage dialysis needs during admission.   Patient resting in bed Alert and oriented Tolerating small meals Denies shortness of breath    Objective:  Vital signs in last 24 hours:  Temp:  [98.9 F (37.2 C)-99.9 F (37.7 C)] 99.9 F (37.7 C) (09/21 0643) Pulse Rate:  [85-104] 98 (09/21 0745) Resp:  [19-25] 22 (09/21 0745) BP: (74-92)/(52-65) 88/60 (09/21 0745) SpO2:  [91 %-98 %] 98 % (09/21 0745)  Weight change:  Filed Weights   09/23/20 0834  Weight: 90 kg    Intake/Output: No intake/output data recorded.   Intake/Output this shift:  No intake/output data recorded.  Physical Exam: General: NAD, resting in bed  Head: Normocephalic, atraumatic. Moist oral mucosal membranes  Eyes: Anicteric  Lungs:  Clear to auscultation, normal effort, room air  Heart: Regular rate and rhythm  Abdomen:  Soft, nontender, nondistended  Extremities: LLE 1+ peripheral edema, red, hot  Neurologic: Nonfocal, moving all four extremities  Skin: BLE tight, shiny, dark  Access: In left upper aVF    Basic Metabolic Panel: Recent Labs  Lab 09/23/20 0845 09/24/20 0436  NA 135 133*  K 4.2 4.3  CL 92* 92*  CO2 28 28  GLUCOSE 96 92  BUN 31* 45*  CREATININE 4.92* 6.22*  CALCIUM 9.1 8.8*     Liver Function Tests: Recent Labs  Lab  09/23/20 0845  AST 28  ALT 16  ALKPHOS 158*  BILITOT 1.1  PROT 9.2*  ALBUMIN 3.4*    No results for input(s): LIPASE, AMYLASE in the last 168 hours. No results for input(s): AMMONIA in the last 168 hours.  CBC: Recent Labs  Lab 09/23/20 0845 09/24/20 0436  WBC 17.9* 16.9*  NEUTROABS 13.2*  --   HGB 12.8* 11.4*  HCT 39.1 33.7*  MCV 99.5 99.7  PLT 298 247     Cardiac Enzymes: No results for input(s): CKTOTAL, CKMB, CKMBINDEX, TROPONINI in the last 168 hours.  BNP: Invalid input(s): POCBNP  CBG: No results for input(s): GLUCAP in the last 168 hours.  Microbiology: Results for orders placed or performed during the hospital encounter of 09/23/20  Resp Panel by RT-PCR (Flu A&B, Covid) Nasopharyngeal Swab     Status: None   Collection Time: 09/23/20  8:45 AM   Specimen: Nasopharyngeal Swab; Nasopharyngeal(NP) swabs in vial transport medium  Result Value Ref Range Status   SARS Coronavirus 2 by RT PCR NEGATIVE NEGATIVE Final    Comment: (NOTE) SARS-CoV-2 target nucleic acids are NOT DETECTED.  The SARS-CoV-2 RNA is generally detectable in upper respiratory specimens during the acute phase of infection. The lowest concentration of SARS-CoV-2 viral copies this assay can detect is 138 copies/mL. A negative result does not preclude SARS-Cov-2 infection and should not be used as the sole basis for treatment or other patient management decisions. A negative result may occur with  improper specimen collection/handling, submission  of specimen other than nasopharyngeal swab, presence of viral mutation(s) within the areas targeted by this assay, and inadequate number of viral copies(<138 copies/mL). A negative result must be combined with clinical observations, patient history, and epidemiological information. The expected result is Negative.  Fact Sheet for Patients:  EntrepreneurPulse.com.au  Fact Sheet for Healthcare Providers:   IncredibleEmployment.be  This test is no t yet approved or cleared by the [place] FDA and  has been authorized for detection and/or diagnosis of SARS-CoV-2 by FDA under an Emergency Use Authorization (EUA). This EUA will remain  in effect (meaning this test can be used) for the duration of the COVID-19 declaration under Section 564(b)(1) of the Act, 21 U.S.C.section 360bbb-3(b)(1), unless the authorization is terminated  or revoked sooner.       Influenza A by PCR NEGATIVE NEGATIVE Final   Influenza B by PCR NEGATIVE NEGATIVE Final    Comment: (NOTE) The Xpert Xpress SARS-CoV-2/FLU/RSV plus assay is intended as an aid in the diagnosis of influenza from Nasopharyngeal swab specimens and should not be used as a sole basis for treatment. Nasal washings and aspirates are unacceptable for Xpert Xpress SARS-CoV-2/FLU/RSV testing.  Fact Sheet for Patients: EntrepreneurPulse.com.au  Fact Sheet for Healthcare Providers: IncredibleEmployment.be  This test is not yet approved or cleared by the [place] FDA and has been authorized for detection and/or diagnosis of SARS-CoV-2 by FDA under an Emergency Use Authorization (EUA). This EUA will remain in effect (meaning this test can be used) for the duration of the COVID-19 declaration under Section 564(b)(1) of the Act, 21 U.S.C. section 360bbb-3(b)(1), unless the authorization is terminated or revoked.  Performed at [facility], [address] [place], [place] [address]   Blood Culture (routine x 2)     Status: None (Preliminary result)   Collection Time: 09/23/20  8:45 AM   Specimen: BLOOD  Result Value Ref Range Status   Specimen Description BLOOD RIGHT [facility]  Final   Special Requests   Final    BOTTLES DRAWN AEROBIC AND ANAEROBIC Blood Culture adequate volume   Culture   Final    NO GROWTH < 24 HOURS Performed at [facility], [address],  [place], [place] [address]    Report Status PENDING  Incomplete  Blood Culture (routine x 2)     Status: None (Preliminary result)   Collection Time: 09/23/20  8:45 AM   Specimen: BLOOD  Result Value Ref Range Status   Specimen Description BLOOD RIGHT FA  Final   Special Requests   Final    BOTTLES DRAWN AEROBIC AND ANAEROBIC Blood Culture adequate volume   Culture   Final    NO GROWTH < 24 HOURS Performed at [facility], [address] [place], [place] [address]    Report Status PENDING  Incomplete    Coagulation Studies: Recent Labs    09/23/20 0845 09/24/20 0436  LABPROT 13.4 15.4*  INR 1.0 1.2     Urinalysis: No results for input(s): COLORURINE, LABSPEC, PHURINE, GLUCOSEU, HGBUR, BILIRUBINUR, KETONESUR, PROTEINUR, UROBILINOGEN, NITRITE, LEUKOCYTESUR in the last 72 hours.  Invalid input(s): APPERANCEUR    Imaging: US Venous Img Lower Unilateral Left (DVT)  Result Date: 09/23/2020 CLINICAL DATA:  Left lower extremity pain and edema. History of malignancy. Evaluate for DVT. EXAM: LEFT LOWER EXTREMITY VENOUS DOPPLER ULTRASOUND TECHNIQUE: Gray-scale sonography with graded compression, as well as color Doppler and duplex ultrasound were performed to evaluate the lower extremity deep venous systems from the level of  the common femoral vein and including the common femoral, femoral, profunda femoral, popliteal and calf veins including the posterior tibial, peroneal and gastrocnemius veins when visible. The superficial great saphenous vein was also interrogated. Spectral Doppler was utilized to evaluate flow at rest and with distal augmentation maneuvers in the common femoral, femoral and popliteal veins. COMPARISON:  None. FINDINGS: Contralateral Common Femoral Vein: Respiratory phasicity is normal and symmetric with the symptomatic side. No evidence of thrombus. Normal compressibility. Common Femoral Vein: No evidence of thrombus. Normal compressibility, respiratory phasicity  and response to augmentation. Saphenofemoral Junction: No evidence of thrombus. Normal compressibility and flow on color Doppler imaging. Profunda Femoral Vein: No evidence of thrombus. Normal compressibility and flow on color Doppler imaging. Femoral Vein: No evidence of thrombus. Normal compressibility, respiratory phasicity and response to augmentation. Popliteal Vein: No evidence of thrombus. Normal compressibility, respiratory phasicity and response to augmentation. Calf Veins: No evidence of thrombus. Normal compressibility and flow on color Doppler imaging. Superficial Great Saphenous Vein: No evidence of thrombus. Normal compressibility. Other Findings:  None. IMPRESSION: No evidence of DVT within the left lower extremity. Electronically Signed   By: Sandi Mariscal M.D.   On: 09/23/2020 13:51   DG Chest Port 1 View  Result Date: 09/23/2020 CLINICAL DATA:  Suspect sepsis.  Complains of ongoing fever. EXAM: PORTABLE CHEST 1 VIEW COMPARISON:  06/07/2019 FINDINGS: Normal heart size. Pulmonary vascular congestion identified. Areas of platelike atelectasis noted within the left mid and left lower lung. No airspace consolidation identified. IMPRESSION: Left mid and lower lung platelike atelectasis. Electronically Signed   By: Kerby Moors M.D.   On: 09/23/2020 09:25     Medications:    sodium chloride     ceFEPime (MAXIPIME) IV 1 g (09/24/20 0836)   metronidazole Stopped (09/23/20 2320)   vancomycin      amiodarone  200 mg Oral Daily   atorvastatin  20 mg Oral Daily   Buprenorphine HCl  150 mcg Buccal BID   And   Buprenorphine HCl  300 mcg Buccal BID   calcium acetate  1,334 mg Oral TID WC   Chlorhexidine Gluconate Cloth  6 each Topical Q0600   cinacalcet  60 mg Oral Q breakfast   clopidogrel  75 mg Oral Daily   cycloSPORINE  1 drop Both Eyes BID   docusate sodium  100 mg Oral Daily   DULoxetine  30 mg Oral Daily   heparin  5,000 Units Subcutaneous Q8H   lactase  3,000 Units Oral TID WC    lidocaine  1 patch Transdermal Q12H   loratadine  10 mg Oral Daily   melatonin  5 mg Oral QHS   midodrine  15 mg Oral TID WC   mirtazapine  15 mg Oral QHS   multivitamin  1 tablet Oral QHS   pantoprazole  40 mg Oral Daily   pregabalin  50 mg Oral Daily   sodium chloride flush  3 mL Intravenous Q12H   sodium chloride flush  3 mL Intravenous Q12H   [START ON 09/26/2020] Vitamin D (Ergocalciferol)  50,000 Units Oral Weekly   sodium chloride, acetaminophen, ALPRAZolam, hydrOXYzine, ipratropium, midodrine, nitroGLYCERIN, ondansetron **OR** ondansetron (ZOFRAN) IV, polyvinyl alcohol, senna-docusate, sodium chloride flush, traZODone  Assessment/ Plan:  Mr. ARDEN TINOCO is a 65 y.o.  male with previous medical conditions including dyslipidemia, coronary artery disease, peripheral artery disease, paroxysmal atrial fibrillation, and end-stage renal disease on dialysis. Patient admitted to [facility] with Sepsis [facility]) [A41.9]  [facility]/L[facility] AVF  End  stage renal disease on dialysis. Received full treatment yesterday, prior to admission. Scheduled to receive dialysis today, low UF goal.  Anemia of chronic kidney disease Lab Results  Component Value Date   HGB 11.4 (L) 09/24/2020  Hgb at target   Secondary Hyperparathyroidism:   Lab Results  Component Value Date   CALCIUM 8.8 (L) 09/24/2020   PHOS 2.9 11/04/2018  Calcium slightly below target  4. Sepsis from left lower extremity cellulitis: febrile, tachycardia Venous US negative for DVT, IV antibiotics      LOS: 1 Haldon Carley 9/21/20229:50 AM

## 2020-09-25 DIAGNOSIS — A419 Sepsis, unspecified organism: Secondary | ICD-10-CM | POA: Diagnosis not present

## 2020-09-25 DIAGNOSIS — I739 Peripheral vascular disease, unspecified: Secondary | ICD-10-CM | POA: Diagnosis not present

## 2020-09-25 DIAGNOSIS — L03116 Cellulitis of left lower limb: Secondary | ICD-10-CM | POA: Diagnosis not present

## 2020-09-25 DIAGNOSIS — N186 End stage renal disease: Secondary | ICD-10-CM | POA: Diagnosis not present

## 2020-09-25 LAB — BASIC METABOLIC PANEL
Anion gap: 17 — ABNORMAL HIGH (ref 5–15)
BUN: 35 mg/dL — ABNORMAL HIGH (ref 8–23)
CO2: 26 mmol/L (ref 22–32)
Calcium: 9.1 mg/dL (ref 8.9–10.3)
Chloride: 88 mmol/L — ABNORMAL LOW (ref 98–111)
Creatinine, Ser: 4.88 mg/dL — ABNORMAL HIGH (ref 0.61–1.24)
GFR, Estimated: 12 mL/min — ABNORMAL LOW (ref >60)
Glucose, Bld: 77 mg/dL (ref 70–99)
Potassium: 3.9 mmol/L (ref 3.5–5.1)
Sodium: 131 mmol/L — ABNORMAL LOW (ref 135–145)

## 2020-09-25 LAB — MAGNESIUM: Magnesium: 1.6 mg/dL — ABNORMAL LOW (ref 1.7–2.4)

## 2020-09-25 LAB — CBC
HCT: 33.4 % — ABNORMAL LOW (ref 39.0–52.0)
Hemoglobin: 11.1 g/dL — ABNORMAL LOW (ref 13.0–17.0)
MCH: 33.1 pg (ref 26.0–34.0)
MCHC: 33.2 g/dL (ref 30.0–36.0)
MCV: 99.7 fL (ref 80.0–100.0)
Platelets: 225 10*3/uL (ref 150–400)
RBC: 3.35 MIL/uL — ABNORMAL LOW (ref 4.22–5.81)
RDW: 16.2 % — ABNORMAL HIGH (ref 11.5–15.5)
WBC: 11.4 10*3/uL — ABNORMAL HIGH (ref 4.0–10.5)
nRBC: 0.2 % (ref 0.0–0.2)

## 2020-09-25 LAB — HEPATITIS B SURFACE ANTIBODY, QUANTITATIVE: Hep B S AB Quant (Post): 396.7 m[IU]/mL (ref >9.9)

## 2020-09-25 LAB — PROCALCITONIN: Procalcitonin: 7.82 ng/mL

## 2020-09-25 MED ORDER — SODIUM CHLORIDE 0.9 % IV SOLN
1.0000 g | INTRAVENOUS | Status: DC
  Filled 2020-09-25: qty 1

## 2020-09-25 MED ORDER — TRAZODONE HCL 100 MG PO TABS
100.0000 mg | ORAL_TABLET | Freq: Every day | ORAL | Status: DC
  Administered 2020-09-25 – 2020-09-27 (×3): 100 mg via ORAL
  Filled 2020-09-25 (×3): qty 1

## 2020-09-25 MED ORDER — FLUTICASONE PROPIONATE 50 MCG/ACT NA SUSP
2.0000 | Freq: Every day | NASAL | Status: DC
  Administered 2020-09-25 – 2020-09-28 (×4): 2 via NASAL
  Filled 2020-09-25: qty 16

## 2020-09-25 MED ORDER — ALPRAZOLAM 0.5 MG PO TABS: 0.5000 mg | ORAL_TABLET | Freq: Every day | ORAL | Status: DC | PRN

## 2020-09-25 MED ORDER — MAGNESIUM SULFATE 2 GM/50ML IV SOLN
2.0000 g | Freq: Once | INTRAVENOUS | Status: AC
  Administered 2020-09-25: 2 g via INTRAVENOUS
  Filled 2020-09-25: qty 50

## 2020-09-25 MED ORDER — IPRATROPIUM BROMIDE 0.03 % NA SOLN
2.0000 | Freq: Three times a day (TID) | NASAL | Status: DC
  Administered 2020-09-25 – 2020-09-28 (×9): 2 via NASAL
  Filled 2020-09-25: qty 30

## 2020-09-25 MED ORDER — ALPRAZOLAM 0.5 MG PO TABS
0.5000 mg | ORAL_TABLET | Freq: Every day | ORAL | Status: DC
  Administered 2020-09-25 – 2020-09-27 (×3): 0.5 mg via ORAL
  Filled 2020-09-25 (×3): qty 1

## 2020-09-25 MED ORDER — GUAIFENESIN ER 600 MG PO TB12: 600.0000 mg | ORAL_TABLET | Freq: Two times a day (BID) | ORAL | Status: DC | PRN

## 2020-09-25 NOTE — Consult Note (Signed)
Pharmacy Antibiotic Note  William Carpenter is a 65 y.o. male with PMH of ESRD on hemodialysis (M/W/F), history of paroxysmal atrial fibrillation, dyslipidemia, coronary artery disease, peripheral arterial disease who was admitted on 09/23/2020 with cellulitis.  Pharmacy has been consulted for Vancomycin dosing. Today is day 3 of ABX.   Temp 100.2 on admin, afebrile since, Wbc 17.9>16.9>11.4, procal 7.82  Bcx have had NG x2 days  Plan: Vancomycin --Will continue vancomycin 1g IV on MWF following dialysis --Will obtain vancomycin levels after 4th or 5th dose  Cefepime --Will continue cefepime 1g IV daily following dialysis on dialysis days  Will continue to monitor cultures and vancomycin levels and adjust dose as clinically indicated   Height: 5\' 9"  (175.3 cm) Weight: 90.4 kg (199 lb 4.7 oz) IBW/kg (Calculated) : 70.7  Temp (24hrs), Avg:99.1 F (37.3 C), Min:98.6 F (37 C), Max:99.7 F (37.6 C)  Recent Labs  Lab 09/23/20 0845 09/24/20 0436 09/25/20 0606  WBC 17.9* 16.9* 11.4*  CREATININE 4.92* 6.22* 4.88*  LATICACIDVEN 1.8  --   --      Estimated Creatinine Clearance: 16.8 mL/min (A) (by C-G formula based on SCr of 4.88 mg/dL (H)).    Allergies  Allergen Reactions   Albumin (Human) Hives   Erythromycin Hives   Iodinated Diagnostic Agents Hives   Iron Dextran    Metoprolol Tartrate    Penicillins Swelling   Quinine Sulfate [Quinine]    Rabeprazole    Sulfa Antibiotics Other (See Comments)   Tape Other (See Comments)    Removes skin.   Trimethoprim    Aloe      Antimicrobials this admission: Ongoing 9/20 Cefepime >>  9/20 Vancomycin >>   Completed 9/20 Metronidazole >> 9/22     Microbiology results: 9/20 Bcx: NG x2 days  Thank you for allowing pharmacy to be a part of this patient's care.  Narda Rutherford, PharmD Pharmacy Resident  09/25/2020 12:37 PM

## 2020-09-25 NOTE — Progress Notes (Signed)
[facility]  ROUNDING NOTE   Subjective:   William Carpenter is a 65 year old male with previous medical conditions including dyslipidemia, coronary artery disease, peripheral artery disease, paroxysmal atrial fibrillation, and end-stage renal disease on dialysis.  Patient presents to the ED with complaints of fever and shaking chills.  He is accompanied to the ED with his daughter and personal aide.  Patient will be admitted to [facility] for DVT (deep venous thrombosis) (HCC) [I82.409] Left leg cellulitis [L03.116] Sepsis (HCC) [A41.9] Pain and swelling of left lower leg [O16.073, M79.89] Sepsis without acute organ dysfunction, due to unspecified organism [facility]) [A41.9]  Patient is known to our clinic from previous hospitalizations.  He currently receives outpatient dialysis treatments at the [facility] clinic in [facility].  We have been consulted to manage dialysis needs during admission.   Patient seen resting in bed Tolerating small meals Denies nausea, vomiting and diarrhea Denies shortness of breath  Objective:  Vital signs in last 24 hours:  Temp:  [98.6 F (37 C)-99.7 F (37.6 C)] 98.9 F (37.2 C) (09/22 1140) Pulse Rate:  [72-101] 72 (09/22 1447) Resp:  [16-19] 16 (09/22 1140) BP: (95-118)/(68-93) 104/74 (09/22 1447) SpO2:  [95 %-99 %] 96 % (09/22 1447) Weight:  [90.4 kg] 90.4 kg (09/21 1952)  Weight change: 0.4 kg Filed Weights   09/23/20 0834 09/24/20 1952  Weight: 90 kg 90.4 kg    Intake/Output: I/O last 3 completed shifts: In: 243 [P.O.:240; I.V.:3] Out: 500 [Other:500]   Intake/Output this shift:  Total I/O In: 360 [P.O.:360] Out: -   Physical Exam: General: NAD, resting in bed  Head: Normocephalic, atraumatic. Moist oral mucosal membranes  Eyes: Anicteric  Lungs:  Clear to auscultation, normal effort, room air  Heart: Regular rate and rhythm  Abdomen:  Soft, nontender, nondistended  Extremities: LLE 1+ peripheral edema, red, hot   Neurologic: Nonfocal, moving all four extremities  Skin: BLE tight, shiny, dark  Access: In left upper aVF    Basic Metabolic Panel: Recent Labs  Lab 09/23/20 0845 09/24/20 0436 09/25/20 0606  NA 135 133* 131*  K 4.2 4.3 3.9  CL 92* 92* 88*  CO2 28 28 26   GLUCOSE 96 92 77  BUN 31* 45* 35*  CREATININE 4.92* 6.22* 4.88*  CALCIUM 9.1 8.8* 9.1  MG  --   --  1.6*     Liver Function Tests: Recent Labs  Lab 09/23/20 0845  AST 28  ALT 16  ALKPHOS 158*  BILITOT 1.1  PROT 9.2*  ALBUMIN 3.4*    No results for input(s): LIPASE, AMYLASE in the last 168 hours. No results for input(s): AMMONIA in the last 168 hours.  CBC: Recent Labs  Lab 09/23/20 0845 09/24/20 0436 09/25/20 0606  WBC 17.9* 16.9* 11.4*  NEUTROABS 13.2*  --   --   HGB 12.8* 11.4* 11.1*  HCT 39.1 33.7* 33.4*  MCV 99.5 99.7 99.7  PLT 298 247 225     Cardiac Enzymes: No results for input(s): CKTOTAL, CKMB, CKMBINDEX, TROPONINI in the last 168 hours.  BNP: Invalid input(s): POCBNP  CBG: No results for input(s): GLUCAP in the last 168 hours.  Microbiology: Results for orders placed or performed during the hospital encounter of 09/23/20  Resp Panel by RT-PCR (Flu A&B, Covid) Nasopharyngeal Swab     Status: None   Collection Time: 09/23/20  8:45 AM   Specimen: Nasopharyngeal Swab; Nasopharyngeal(NP) swabs in vial transport medium  Result Value Ref Range Status   SARS Coronavirus  2 by RT PCR NEGATIVE NEGATIVE Final    Comment: (NOTE) SARS-CoV-2 target nucleic acids are NOT DETECTED.  The SARS-CoV-2 RNA is generally detectable in upper respiratory specimens during the acute phase of infection. The lowest concentration of SARS-CoV-2 viral copies this assay can detect is 138 copies/mL. A negative result does not preclude SARS-Cov-2 infection and should not be used as the sole basis for treatment or other patient management decisions. A negative result may occur with  improper specimen  collection/handling, submission of specimen other than nasopharyngeal swab, presence of viral mutation(s) within the areas targeted by this assay, and inadequate number of viral copies(<138 copies/mL). A negative result must be combined with clinical observations, patient history, and epidemiological information. The expected result is Negative.  Fact Sheet for Patients:  EntrepreneurPulse.com.au  Fact Sheet for Healthcare Providers:  IncredibleEmployment.be  This test is no t yet approved or cleared by the [place] FDA and  has been authorized for detection and/or diagnosis of SARS-CoV-2 by FDA under an Emergency Use Authorization (EUA). This EUA will remain  in effect (meaning this test can be used) for the duration of the COVID-19 declaration under Section 564(b)(1) of the Act, 21 U.S.C.section 360bbb-3(b)(1), unless the authorization is terminated  or revoked sooner.       Influenza A by PCR NEGATIVE NEGATIVE Final   Influenza B by PCR NEGATIVE NEGATIVE Final    Comment: (NOTE) The Xpert Xpress SARS-CoV-2/FLU/RSV plus assay is intended as an aid in the diagnosis of influenza from Nasopharyngeal swab specimens and should not be used as a sole basis for treatment. Nasal washings and aspirates are unacceptable for Xpert Xpress SARS-CoV-2/FLU/RSV testing.  Fact Sheet for Patients: EntrepreneurPulse.com.au  Fact Sheet for Healthcare Providers: IncredibleEmployment.be  This test is not yet approved or cleared by the [place] FDA and has been authorized for detection and/or diagnosis of SARS-CoV-2 by FDA under an Emergency Use Authorization (EUA). This EUA will remain in effect (meaning this test can be used) for the duration of the COVID-19 declaration under Section 564(b)(1) of the Act, 21 U.S.C. section 360bbb-3(b)(1), unless the authorization is terminated or revoked.  Performed at [facility], [address] [place], [place] [address]   Blood Culture (routine x 2)     Status: None (Preliminary result)   Collection Time: 09/23/20  8:45 AM   Specimen: BLOOD  Result Value Ref Range Status   Specimen Description BLOOD RIGHT [facility]  Final   Special Requests   Final    BOTTLES DRAWN AEROBIC AND ANAEROBIC Blood Culture adequate volume   Culture   Final    NO GROWTH 2 DAYS Performed at [facility], [address], [place], [place] [address]    Report Status PENDING  Incomplete  Blood Culture (routine x 2)     Status: None (Preliminary result)   Collection Time: 09/23/20  8:45 AM   Specimen: BLOOD  Result Value Ref Range Status   Specimen Description BLOOD RIGHT FA  Final   Special Requests   Final    BOTTLES DRAWN AEROBIC AND ANAEROBIC Blood Culture adequate volume   Culture   Final    NO GROWTH 2 DAYS Performed at [facility], [address], [place], [place] [address]    Report Status PENDING  Incomplete    Coagulation Studies: Recent Labs    09/23/20 0845 09/24/20 0436  LABPROT 13.4 15.4*  INR 1.0 1.2     Urinalysis: No results for input(s): COLORURINE, LABSPEC,  PHURINE, GLUCOSEU, HGBUR, BILIRUBINUR, KETONESUR, PROTEINUR, UROBILINOGEN, NITRITE, LEUKOCYTESUR in the last 72 hours.  Invalid input(s): APPERANCEUR    Imaging: No results found.   Medications:    sodium chloride     sodium chloride     sodium chloride     [START ON 09/26/2020] ceFEPime (MAXIPIME) IV     vancomycin 1,000 mg (09/24/20 1223)    ALPRAZolam  0.5 mg Oral QHS   amiodarone  200 mg Oral Daily   atorvastatin  20 mg Oral Daily   Buprenorphine HCl  150 mcg Buccal BID   And   Buprenorphine HCl  300 mcg Buccal BID   calcium acetate  1,334 mg Oral TID WC   Chlorhexidine Gluconate Cloth  6 each Topical Q0600   cinacalcet  60 mg Oral Q breakfast   clopidogrel  75 mg Oral Daily   cycloSPORINE  1 drop Both Eyes BID   docusate sodium  100 mg Oral Daily    DULoxetine  30 mg Oral Daily   fluticasone  2 spray Each Nare Daily   heparin  5,000 Units Subcutaneous Q8H   ipratropium  2 spray Each Nare TID   lactase  3,000 Units Oral TID WC   lidocaine  1 patch Transdermal Q12H   loratadine  10 mg Oral Daily   melatonin  5 mg Oral QHS   midodrine  15 mg Oral TID WC   mirtazapine  15 mg Oral QHS   multivitamin  1 tablet Oral QHS   pantoprazole  40 mg Oral Daily   pregabalin  50 mg Oral Daily   sodium chloride flush  3 mL Intravenous Q12H   sodium chloride flush  3 mL Intravenous Q12H   traZODone  100 mg Oral QHS   [START ON 09/26/2020] Vitamin D (Ergocalciferol)  50,000 Units Oral Weekly   sodium chloride, sodium chloride, sodium chloride, acetaminophen, ALPRAZolam, alteplase, guaiFENesin, heparin, ipratropium, lidocaine (PF), lidocaine-prilocaine, midodrine, nitroGLYCERIN, ondansetron **OR** ondansetron (ZOFRAN) IV, pentafluoroprop-tetrafluoroeth, polyvinyl alcohol, senna-docusate, sodium chloride flush  Assessment/ Plan:  [name] is a 65 y.o.  male with previous medical conditions including dyslipidemia, coronary artery disease, peripheral artery disease, paroxysmal atrial fibrillation, and end-stage renal disease on dialysis. Patient admitted to [facility] with DVT (deep venous thrombosis) (HCC) [I82.409] Left leg cellulitis [L03.116] Sepsis (HCC) [A41.9] Pain and swelling of left lower leg [A21.308, M79.89] Sepsis without acute organ dysfunction, due to unspecified organism ([place]) [A41.9]  [facility]/MWF/Lt AVF  End stage renal disease on dialysis. Received full treatment yesterday, prior to admission. Received dialysis yesterday, tolerated well. UF 556ml achieved. Next treatment scheduled for Friday  Anemia of chronic kidney disease Lab Results  Component Value Date   HGB 11.1 (L) 09/25/2020  Hgb within acceptable range   Secondary Hyperparathyroidism:   Lab Results  Component Value Date   CALCIUM 9.1 09/25/2020   PHOS  2.9 11/04/2018  Calcium and phosphorus at goal  4. Sepsis from left lower extremity cellulitis: febrile, tachycardia Venous US negative for DVT, IV antibiotics. Patient and family prefer surgical intervention at [place].     LOS: 2 Ithan Touhey 9/22/20223:59 PM

## 2020-09-25 NOTE — Evaluation (Addendum)
Physical Therapy Evaluation Patient Details Name: William Carpenter MRN: [id number] DOB: 1955/12/19 Today's Date: 09/25/2020  History of Present Illness  65 year old male with past medical history of ESRD on dialysis MWF, paroxysmal A. fib, hyperlipidemia, CAD, PAD who follows at the door [facility], presenting to the ER via EMS due to fever and shaking. Pt was admitted forsepsis due to LLE cellulitis.  Clinical Impression  Pt alert, oriented x 4, pleasant, cooperative with treatment session. Pt states PLOF as MOD I for household mobility w/ rollator. Pt requires assistance for ADLs, aid provides assistance 5x/week for bathing, dressing, etc. Pt lives w/ daughter and son-in-law who are able to provide support 24/7 if necessary.  Pt noted lightheadedness with position transitions at baseline. BP (supine) 104/75 at rest. Pt unable to complete orthostatic assessment due to sudden nausea and needing to utilize [facility]. Pt transferred w/ SUP for sit > stand, bed height elevated w/ transfer to [facility] + 1 hand hold assist, MIN G for safety. Due to changes in PLOF HHPT is recommended to improve functional mobility and return to PLOF as able. Skilled PT intervention is indicated to address deficits in function, mobility, and to return to PLOF as able.       Recommendations for follow up therapy are one component of a multi-disciplinary discharge planning process, led by the attending physician.  Recommendations may be updated based on patient status, additional functional criteria and insurance authorization.  Follow Up Recommendations Home health PT; 24/7 supervision    Equipment Recommendations  Rolling walker with 5" wheels    Recommendations for Other Services       Precautions / Restrictions Precautions Precautions: Fall Restrictions Weight Bearing Restrictions: No      Mobility  Bed Mobility Overal bed mobility: Modified Independent             General bed mobility comments:  Supin > sit w/ MOD, HOB heavily elevated    Transfers Overall transfer level: Needs assistance Equipment used: 1 person hand held assist Transfers: Stand Pivot Transfers;Sit to/from Stand Sit to Stand: Supervision Stand pivot transfers: Min guard       General transfer comment: MIN G for safety bed > BSC  Ambulation/Gait             General Gait Details: deferred secondary to nausea and malaise  Stairs            Wheelchair Mobility    Modified Rankin (Stroke Patients Only)       Balance Overall balance assessment: Needs assistance Sitting-balance support: Feet supported;Single extremity supported Sitting balance-Leahy Scale: Good Sitting balance - Comments: Pt able to sit without BUE support without lean or LOB     Standing balance-Leahy Scale: Fair Standing balance comment: + 1 handheld assit bed > BSC, pt reaching for PT assist and arm rests                             Pertinent Vitals/Pain Pain Assessment: Faces Faces Pain Scale: Hurts even more Pain Location: L LE, L great toe, , Pain Descriptors / Indicators: Aching;Sore Pain Intervention(s): Limited activity within patient's tolerance;Monitored during session;Repositioned    Home Living Family/patient expects to be discharged to:: Private residence Living Arrangements: Children Available Help at Discharge: Family;Available 24 hours/day Type of Home: House Home Access: Ramped entrance     Home Layout: Two level;Able to live on main level with bedroom/bathroom Home Equipment: Shower seat;Walker - 4  wheels;Grab bars - toilet;Other (comment);Wheelchair - manual (hospital bed)      Prior Function Level of Independence: Needs assistance   Gait / Transfers Assistance Needed: Household amb w/ rollator MOD I w/ WC for ramp entry and community distances  ADL's / Homemaking Assistance Needed: Daughter and son-in-law assist with groceries and general care; aid is present M-F to assit with  bathing, dressing, hearing aids, ect        Hand Dominance        Extremity/Trunk Assessment   Upper Extremity Assessment Upper Extremity Assessment: Overall WFL for tasks assessed    Lower Extremity Assessment Lower Extremity Assessment: Generalized weakness (SILT BLE slight less L > R; decreased Bilat DF AROM, unable to achieve neutral positioning)       Communication   Communication: No difficulties  Cognition Arousal/Alertness: Awake/alert Behavior During Therapy: WFL for tasks assessed/performed Overall Cognitive Status: Within Functional Limits for tasks assessed                                 General Comments: AOx4      General Comments General comments (skin integrity, edema, etc.): edema LLE, warmth,    Exercises     Assessment/Plan    PT Assessment Patient needs continued PT services  PT Problem List Decreased strength;Decreased range of motion;Decreased activity tolerance;Decreased balance;Decreased mobility;Decreased coordination       PT Treatment Interventions Balance training;Gait training;Neuromuscular re-education;Stair training;Functional mobility training;Therapeutic activities;Therapeutic exercise    PT Goals (Current goals can be found in the Care Plan section)  Acute Rehab PT Goals Patient Stated Goal: To go home PT Goal Formulation: With patient Time For Goal Achievement: 10/09/20 Potential to Achieve Goals: Good    Frequency Min 2X/week   Barriers to discharge        Co-evaluation               AM-PAC PT "6 Clicks" Mobility  Outcome Measure Help needed turning from your back to your side while in a flat bed without using bedrails?: None Help needed moving from lying on your back to sitting on the side of a flat bed without using bedrails?: A Little Help needed moving to and from a bed to a chair (including a wheelchair)?: A Little Help needed standing up from a chair using your arms (e.g., wheelchair or  bedside chair)?: A Little Help needed to walk in hospital room?: A Lot Help needed climbing 3-5 steps with a railing? : Total 6 Click Score: 16    End of Session Equipment Utilized During Treatment: Gait belt Activity Tolerance: Other (comment) (treatment limited by nausea) Patient left: Other (comment) [facility] w/ nursing present) Nurse Communication: Mobility status PT Visit Diagnosis: Muscle weakness (generalized) (M62.81);Other abnormalities of gait and mobility (R26.89)    Time: 1425-1501 PT Time Calculation (min) (ACUTE ONLY): 36 min   Charges:             [employer], SPT

## 2020-09-25 NOTE — Progress Notes (Addendum)
PROGRESS NOTE    William Carpenter   ZOX:[id number]  DOB: [birth year]  PCP: Raelyn Mora, MD    DOA: 09/23/2020 LOS: 2    Brief Narrative / Hospital Course to Date:   65 year old male with past medical history of ESRD on dialysis MWF, paroxysmal A. fib, hyperlipidemia, CAD, PAD who follows at the door [facility], presenting to the ER via EMS due to fever and shaking changes.  Patient was recently admitted at the [facility] for 3 weeks with sepsis secondary to left lower extremity cellulitis and discharged on doxycycline as prophylaxis for recurrence.  T-max at home 101F.  Patient noted progressive redness and swelling of extremity.  Admitted to [facility] service and started on IV antibiotics with vascular surgery consulted.  Patient and family prefer any surgical intervention to be performed at the [place].  Therefore being treated conservatively here with IV antibiotics.  Assessment & Plan   Principal Problem:   Sepsis ([place]) Active Problems:   ESRD on dialysis ([place])   Ischemic cardiomyopathy   PAF (paroxysmal atrial fibrillation) (H[facility])   Cellulitis   PAD (peripheral artery disease) ([place])   Sepsis due to left lower extremity cellulitis, recurrent in the setting of severe peripheral artery disease -patient met sepsis criteria with fever, tachycardia, leukocytosis in the setting of cellulitis.  She left lower extremity Doppler negative for DVT. --Continue IV antibiotics: Vancomycin, Cefepime --Stop Flagyl --Elevate left lower extremity --Follow blood cultures --Vascular surgery following, follow-up recommendations  Hypomagnesemia - replacing 2g IV Mg-sulfate for  Mg 1.6 this AM (9/22)  History of paroxysmal A. Fib -currently rate controlled.  Continue amiodarone.  Patient not on long-term anticoagulation due to increased risk of bleeding. --Monitor telemetry  ESRD -on hemodialysis Monday Wednesday Friday.  Nephrology following.  Monitor.  Anxiety/depression -continue home Xanax,  Remeron, Cymbalta and trazodone  Patient BMI: Body mass index is 29.43 kg/m.   DVT prophylaxis: heparin injection 5,000 Units Start: 09/23/20 1400   Diet:  Diet Orders (From admission, onward)     Start     Ordered   09/23/20 1147  Diet renal with fluid restriction Fluid restriction: 1200 mL Fluid; Room service appropriate? Yes; Fluid consistency: Thin  Diet effective now       Question Answer Comment  Fluid restriction: 1200 mL Fluid   Room service appropriate? Yes   Fluid consistency: Thin      09/23/20 1149              Code Status: DNR   Subjective 09/25/20    Patient seen this morning with daughter at bedside.  He reports some left foot/leg pain and discomfort.  No fevers/chills.  Reports constipation with last BM on Sunday (4 days ago).  Reviewed patient's home meds with daughter and updated orders according to how they're taken at home.  Discussed attempting to contact the [place] regarding transfer vs close follow up after discharge.   Disposition Plan & Communication   Status is: Inpatient  Remains inpatient appropriate because:IV treatments appropriate due to intensity of illness or inability to take PO  Dispo: The patient is from: Home              Anticipated d/c is to: Home              Patient currently is not medically stable to d/c.   Difficult to place patient No  Family communication: daughter at bedside on rounds today 9/22.   Consults, Procedures, Significant Events   Consultants:  Vascular surgery Nephrology  Procedures:  Hemodialysis  Antimicrobials:  Anti-infectives (From admission, onward)    Start     Dose/Rate Route Frequency Ordered Stop   09/26/20 1600  ceFEPIme (MAXIPIME) 1 g in sodium chloride 0.9 % 100 mL IVPB        1 g 200 mL/hr over 30 Minutes Intravenous Every 24 hours 09/25/20 1029 10/01/20 1559   09/24/20 1200  vancomycin (VANCOCIN) IVPB 1000 mg/200 mL premix  Status:  Discontinued        1,000 mg 200 mL/hr over 60  Minutes Intravenous Every M-W-F (Hemodialysis) 09/23/20 1741 09/23/20 1743   09/24/20 1200  vancomycin (VANCOCIN) IVPB 1000 mg/200 mL premix        1,000 mg 200 mL/hr over 60 Minutes Intravenous Every M-W-F (Hemodialysis) 09/23/20 1743 10/01/20 1559   09/24/20 0800  ceFEPIme (MAXIPIME) 2 g in sodium chloride 0.9 % 100 mL IVPB  Status:  Discontinued        2 g 200 mL/hr over 30 Minutes Intravenous Every 24 hours 09/23/20 1530 09/23/20 1607   09/24/20 0800  ceFEPIme (MAXIPIME) 1 g in sodium chloride 0.9 % 100 mL IVPB  Status:  Discontinued        1 g 200 mL/hr over 30 Minutes Intravenous Every 24 hours 09/23/20 1607 09/25/20 1029   09/23/20 2100  metroNIDAZOLE (FLAGYL) IVPB 500 mg  Status:  Discontinued        500 mg 100 mL/hr over 60 Minutes Intravenous Every 12 hours 09/23/20 1149 09/25/20 0834   09/23/20 1530  vancomycin (VANCOCIN) IVPB 1000 mg/200 mL premix        1,000 mg 200 mL/hr over 60 Minutes Intravenous  Once 09/23/20 1518 09/24/20 1330   09/23/20 1200  ceFEPIme (MAXIPIME) 2 g in sodium chloride 0.9 % 100 mL IVPB  Status:  Discontinued        2 g 200 mL/hr over 30 Minutes Intravenous  Once 09/23/20 1149 09/23/20 1156   09/23/20 1200  vancomycin (VANCOCIN) IVPB 1000 mg/200 mL premix  Status:  Discontinued        1,000 mg 200 mL/hr over 60 Minutes Intravenous  Once 09/23/20 1149 09/23/20 1225   09/23/20 0900  ceFEPIme (MAXIPIME) 2 g in sodium chloride 0.9 % 100 mL IVPB        2 g 200 mL/hr over 30 Minutes Intravenous  Once 09/23/20 0850 09/23/20 0930   09/23/20 0900  metroNIDAZOLE (FLAGYL) IVPB 500 mg        500 mg 100 mL/hr over 60 Minutes Intravenous  Once 09/23/20 0850 09/23/20 1035   09/23/20 0900  vancomycin (VANCOCIN) IVPB 1000 mg/200 mL premix        1,000 mg 200 mL/hr over 60 Minutes Intravenous  Once 09/23/20 0850 09/23/20 1035         Micro    Objective   Vitals:   09/25/20 0617 09/25/20 0909 09/25/20 1140 09/25/20 1447  BP: 95/73 100/68 104/74 104/74   Pulse: 86 84 86 72  Resp: 18 18 16   Temp: 98.6 F (37 C)  98.9 F (37.2 C)   TempSrc:   Oral Oral  SpO2: 96% 96% 98% 96%  Weight:      Height:        Intake/Output Summary (Last 24 hours) at 09/25/2020 1653 Last data filed at 09/25/2020 1423 Gross per 24 hour  Intake 600 ml  Output 500 ml  Net 100 ml   Filed Weights   09/23/20 0834 09/24/20 1952    Weight: 90 kg 90.4 kg    Physical Exam:  General exam: awake with eyes closed, no acute distress Respiratory system: CTAB, no wheezes, rales or rhonchi, normal respiratory effort. Cardiovascular system: normal S1/S2, RRR.   Central nervous system: A&O x3. no gross focal neurologic deficits, normal speech Extremities: Left lower extremity with tense edema, warmth to touch and dark erythema, normal tone Psychiatry: normal mood, congruent affect, judgement and insight appear normal  Labs   Data Reviewed: I have personally reviewed following labs and imaging studies  CBC: Recent Labs  Lab 09/23/20 0845 09/24/20 0436 09/25/20 0606  WBC 17.9* 16.9* 11.4*  NEUTROABS 13.2*  --   --   HGB 12.8* 11.4* 11.1*  HCT 39.1 33.7* 33.4*  MCV 99.5 99.7 99.7  PLT 298 247 225   Basic Metabolic Panel: Recent Labs  Lab 09/23/20 0845 09/24/20 0436 09/25/20 0606  NA 135 133* 131*  K 4.2 4.3 3.9  CL 92* 92* 88*  CO2 28 28 26  GLUCOSE 96 92 77  BUN 31* 45* 35*  CREATININE 4.92* 6.22* 4.88*  CALCIUM 9.1 8.8* 9.1  MG  --   --  1.6*   GFR: Estimated Creatinine Clearance: 16.8 mL/min (A) (by C-G formula based on SCr of 4.88 mg/dL (H)). Liver Function Tests: Recent Labs  Lab 09/23/20 0845  AST 28  ALT 16  ALKPHOS 158*  BILITOT 1.1  PROT 9.2*  ALBUMIN 3.4*   No results for input(s): LIPASE, AMYLASE in the last 168 hours. No results for input(s): AMMONIA in the last 168 hours. Coagulation Profile: Recent Labs  Lab 09/23/20 0845 09/24/20 0436  INR 1.0 1.2   Cardiac Enzymes: No results for input(s): CKTOTAL, CKMB,  CKMBINDEX, TROPONINI in the last 168 hours. BNP (last 3 results) No results for input(s): PROBNP in the last 8760 hours. HbA1C: No results for input(s): HGBA1C in the last 72 hours. CBG: No results for input(s): GLUCAP in the last 168 hours. Lipid Profile: No results for input(s): CHOL, HDL, LDLCALC, TRIG, CHOLHDL, LDLDIRECT in the last 72 hours. Thyroid Function Tests: No results for input(s): TSH, T4TOTAL, FREET4, T3FREE, THYROIDAB in the last 72 hours. Anemia Panel: No results for input(s): VITAMINB12, FOLATE, FERRITIN, TIBC, IRON, RETICCTPCT in the last 72 hours. Sepsis Labs: Recent Labs  Lab 09/23/20 0845 09/24/20 0436 09/25/20 0606  PROCALCITON  --  6.60 7.82  LATICACIDVEN 1.8  --   --     Recent Results (from the past 240 hour(s))  Resp Panel by RT-PCR (Flu A&B, Covid) Nasopharyngeal Swab     Status: None   Collection Time: 09/23/20  8:45 AM   Specimen: Nasopharyngeal Swab; Nasopharyngeal(NP) swabs in vial transport medium  Result Value Ref Range Status   SARS Coronavirus 2 by RT PCR NEGATIVE NEGATIVE Final    Comment: (NOTE) SARS-CoV-2 target nucleic acids are NOT DETECTED.  The SARS-CoV-2 RNA is generally detectable in upper respiratory specimens during the acute phase of infection. The lowest concentration of SARS-CoV-2 viral copies this assay can detect is 138 copies/mL. A negative result does not preclude SARS-Cov-2 infection and should not be used as the sole basis for treatment or other patient management decisions. A negative result may occur with  improper specimen collection/handling, submission of specimen other than nasopharyngeal swab, presence of viral mutation(s) within the areas targeted by this assay, and inadequate number of viral copies(<138 copies/mL). A negative result must be combined with clinical observations, patient history, and epidemiological information. The expected result is Negative.    Fact Sheet for Patients:   https://www.fda.gov/media/152166/download  Fact Sheet for Healthcare Providers:  https://www.fda.gov/media/152162/download  This test is no t yet approved or cleared by the [place] FDA and  has been authorized for detection and/or diagnosis of SARS-CoV-2 by FDA under an Emergency Use Authorization (EUA). This EUA will remain  in effect (meaning this test can be used) for the duration of the COVID-19 declaration under Section 564(b)(1) of the Act, 21 U.S.C.section 360bbb-3(b)(1), unless the authorization is terminated  or revoked sooner.       Influenza A by PCR NEGATIVE NEGATIVE Final   Influenza B by PCR NEGATIVE NEGATIVE Final    Comment: (NOTE) The Xpert Xpress SARS-CoV-2/FLU/RSV plus assay is intended as an aid in the diagnosis of influenza from Nasopharyngeal swab specimens and should not be used as a sole basis for treatment. Nasal washings and aspirates are unacceptable for Xpert Xpress SARS-CoV-2/FLU/RSV testing.  Fact Sheet for Patients: https://www.fda.gov/media/152166/download  Fact Sheet for Healthcare Providers: https://www.fda.gov/media/152162/download  This test is not yet approved or cleared by the [place] FDA and has been authorized for detection and/or diagnosis of SARS-CoV-2 by FDA under an Emergency Use Authorization (EUA). This EUA will remain in effect (meaning this test can be used) for the duration of the COVID-19 declaration under Section 564(b)(1) of the Act, 21 U.S.C. section 360bbb-3(b)(1), unless the authorization is terminated or revoked.  Performed at [facility], [address] [place], [place] [address]   Blood Culture (routine x 2)     Status: None (Preliminary result)   Collection Time: 09/23/20  8:45 AM   Specimen: BLOOD  Result Value Ref Range Status   Specimen Description BLOOD RIGHT AC  Final   Special Requests   Final    BOTTLES DRAWN AEROBIC AND ANAEROBIC Blood Culture adequate volume   Culture   Final     NO GROWTH 2 DAYS Performed at [facility], [address] [place], [place] [address]    Report Status PENDING  Incomplete  Blood Culture (routine x 2)     Status: None (Preliminary result)   Collection Time: 09/23/20  8:45 AM   Specimen: BLOOD  Result Value Ref Range Status   Specimen Description BLOOD RIGHT FA  Final   Special Requests   Final    BOTTLES DRAWN AEROBIC AND ANAEROBIC Blood Culture adequate volume   Culture   Final    NO GROWTH 2 DAYS Performed at [facility], [address] [place], [place] [address]    Report Status PENDING  Incomplete      Imaging Studies   No results found.   Medications   Scheduled Meds:  ALPRAZolam  0.5 mg Oral QHS   amiodarone  200 mg Oral Daily   atorvastatin  20 mg Oral Daily   Buprenorphine HCl  150 mcg Buccal BID   And   Buprenorphine HCl  300 mcg Buccal BID   calcium acetate  1,334 mg Oral TID WC   Chlorhexidine Gluconate Cloth  6 each Topical Q0600   cinacalcet  60 mg Oral Q breakfast   clopidogrel  75 mg Oral Daily   cycloSPORINE  1 drop Both Eyes BID   docusate sodium  100 mg Oral Daily   DULoxetine  30 mg Oral Daily   fluticasone  2 spray Each Nare Daily   heparin  5,000 Units Subcutaneous Q8H   ipratropium  2 spray Each Nare TID   lactase  3,000 Units Oral TID WC     lidocaine  1 patch Transdermal Q12H   loratadine  10 mg Oral Daily   melatonin  5 mg Oral QHS   midodrine  15 mg Oral TID WC   mirtazapine  15 mg Oral QHS   multivitamin  1 tablet Oral QHS   pantoprazole  40 mg Oral Daily   pregabalin  50 mg Oral Daily   sodium chloride flush  3 mL Intravenous Q12H   sodium chloride flush  3 mL Intravenous Q12H   traZODone  100 mg Oral QHS   [START ON 09/26/2020] Vitamin D (Ergocalciferol)  50,000 Units Oral Weekly   Continuous Infusions:  sodium chloride     sodium chloride     sodium chloride     [START ON 09/26/2020] ceFEPime (MAXIPIME) IV     vancomycin 1,000 mg (09/24/20 1223)        LOS: 2 days    Time spent: 30 minutes with > 50% spent at bedside and in coordination of care     Ezekiel Slocumb, DO [facility]  09/25/2020, 4:53 PM      If 7PM-7AM, please contact night-coverage. How to contact the [facility] Attending or Consulting provider [place] or covering provider during after hours [place], for this patient?    Check the care team in [facility] and look for a) attending/consulting TR[facility] provider listed and b) the [facility] team listed Log into [email or web address] and use [place]'s universal password to access. If you do not have the password, please contact the hospital operator. Locate the [facility] provider you are looking for under [facility] and page to a number that you can be directly reached. If you still have difficulty reaching the provider, please page the [facility] (Director on Call) for the Hospitalists listed on amion for assistance.

## 2020-09-26 DIAGNOSIS — A419 Sepsis, unspecified organism: Secondary | ICD-10-CM | POA: Diagnosis not present

## 2020-09-26 DIAGNOSIS — I739 Peripheral vascular disease, unspecified: Secondary | ICD-10-CM | POA: Diagnosis not present

## 2020-09-26 DIAGNOSIS — N186 End stage renal disease: Secondary | ICD-10-CM | POA: Diagnosis not present

## 2020-09-26 DIAGNOSIS — L03116 Cellulitis of left lower limb: Secondary | ICD-10-CM | POA: Diagnosis not present

## 2020-09-26 LAB — BASIC METABOLIC PANEL
Anion gap: 15 (ref 5–15)
BUN: 48 mg/dL — ABNORMAL HIGH (ref 8–23)
CO2: 26 mmol/L (ref 22–32)
Calcium: 8.5 mg/dL — ABNORMAL LOW (ref 8.9–10.3)
Chloride: 90 mmol/L — ABNORMAL LOW (ref 98–111)
Creatinine, Ser: 6.06 mg/dL — ABNORMAL HIGH (ref 0.61–1.24)
GFR, Estimated: 10 mL/min — ABNORMAL LOW (ref >60)
Glucose, Bld: 79 mg/dL (ref 70–99)
Potassium: 4.1 mmol/L (ref 3.5–5.1)
Sodium: 131 mmol/L — ABNORMAL LOW (ref 135–145)

## 2020-09-26 LAB — PROCALCITONIN: Procalcitonin: 8.22 ng/mL

## 2020-09-26 LAB — CBC
HCT: 30.9 % — ABNORMAL LOW (ref 39.0–52.0)
Hemoglobin: 10.4 g/dL — ABNORMAL LOW (ref 13.0–17.0)
MCH: 33.3 pg (ref 26.0–34.0)
MCHC: 33.7 g/dL (ref 30.0–36.0)
MCV: 99 fL (ref 80.0–100.0)
Platelets: 230 10*3/uL (ref 150–400)
RBC: 3.12 MIL/uL — ABNORMAL LOW (ref 4.22–5.81)
RDW: 15.8 % — ABNORMAL HIGH (ref 11.5–15.5)
WBC: 9.2 10*3/uL (ref 4.0–10.5)
nRBC: 0 % (ref 0.0–0.2)

## 2020-09-26 LAB — MAGNESIUM: Magnesium: 2 mg/dL (ref 1.7–2.4)

## 2020-09-26 LAB — PHOSPHORUS: Phosphorus: 5.2 mg/dL — ABNORMAL HIGH (ref 2.5–4.6)

## 2020-09-26 MED ORDER — CEFDINIR 300 MG PO CAPS: 300.0000 mg | ORAL_CAPSULE | Freq: Two times a day (BID) | ORAL | Status: DC

## 2020-09-26 MED ORDER — SENNOSIDES-DOCUSATE SODIUM 8.6-50 MG PO TABS
1.0000 | ORAL_TABLET | Freq: Two times a day (BID) | ORAL | Status: DC
  Administered 2020-09-26 – 2020-09-28 (×4): 1 via ORAL
  Filled 2020-09-26 (×4): qty 1

## 2020-09-26 MED ORDER — BISACODYL 5 MG PO TBEC
5.0000 mg | DELAYED_RELEASE_TABLET | Freq: Every day | ORAL | Status: DC | PRN
  Administered 2020-09-27: 5 mg via ORAL
  Filled 2020-09-26: qty 1

## 2020-09-26 MED ORDER — DOXYCYCLINE HYCLATE 100 MG PO TABS
100.0000 mg | ORAL_TABLET | Freq: Two times a day (BID) | ORAL | Status: DC
  Administered 2020-09-26 – 2020-09-28 (×5): 100 mg via ORAL
  Filled 2020-09-26 (×5): qty 1

## 2020-09-26 MED ORDER — CEFDINIR 300 MG PO CAPS
300.0000 mg | ORAL_CAPSULE | Freq: Every day | ORAL | Status: DC
  Administered 2020-09-26 – 2020-09-27 (×2): 300 mg via ORAL
  Filled 2020-09-26 (×2): qty 1

## 2020-09-26 NOTE — Progress Notes (Signed)
[facility]  ROUNDING NOTE   Subjective:   William Carpenter is a 65 year old male with previous medical conditions including dyslipidemia, coronary artery disease, peripheral artery disease, paroxysmal atrial fibrillation, and end-stage renal disease on dialysis.  Patient presents to the ED with complaints of fever and shaking chills.  He is accompanied to the ED with his daughter and personal aide.  Patient will be admitted to [facility] for DVT (deep venous thrombosis) (HCC) [I82.409] Left leg cellulitis [L03.116] Sepsis (HCC) [A41.9] Pain and swelling of left lower leg [I71.245, M79.89] Sepsis without acute organ dysfunction, due to unspecified organism [facility]) [A41.9]  Patient is known to our clinic from previous hospitalizations.  He currently receives outpatient dialysis treatments at the [facility] in [facility].  We have been consulted to manage dialysis needs during admission.   Patient seen and evaluated during dialysis   HEMODIALYSIS FLOWSHEET:  Blood Flow Rate (mL/min): 200 mL/min Arterial Pressure (mmHg): -110 mmHg Venous Pressure (mmHg): 80 mmHg Transmembrane Pressure (mmHg): 60 mmHg Ultrafiltration Rate (mL/min): 70 mL/min Dialysate Flow Rate (mL/min): 500 ml/min Conductivity: Machine : 13.8 Conductivity: Machine : 13.8  No complaints at this time  Objective:  Vital signs in last 24 hours:  Temp:  [97.7 F (36.5 C)-99 F (37.2 C)] 99 F (37.2 C) (09/23 1130) Pulse Rate:  [72-91] 82 (09/23 1217) Resp:  [11-22] 16 (09/23 1217) BP: (81-134)/(57-105) 120/82 (09/23 1130) SpO2:  [92 %-100 %] 100 % (09/23 1130)  Weight change:  Filed Weights   09/23/20 0834 09/24/20 1952  Weight: 90 kg 90.4 kg    Intake/Output: I/O last 3 completed shifts: In: 840 [P.O.:840] Out: -    Intake/Output this shift:  Total I/O In: -  Out: 1000 [Other:1000]  Physical Exam: General: NAD, resting in bed  Head: Normocephalic, atraumatic. Moist oral mucosal  membranes  Eyes: Anicteric  Lungs:  Clear to auscultation, normal effort, room air  Heart: Regular rate and rhythm  Abdomen:  Soft, nontender, nondistended  Extremities: LLE 1+ peripheral edema, red, warm  Neurologic: Nonfocal, moving all four extremities  Skin: BLE tight, shiny, dark  Access: In left upper aVF    Basic Metabolic Panel: Recent Labs  Lab 09/23/20 0845 09/24/20 0436 09/25/20 0606 09/26/20 0552  NA 135 133* 131* 131*  K 4.2 4.3 3.9 4.1  CL 92* 92* 88* 90*  CO2 28 28 26 26   GLUCOSE 96 92 77 79  BUN 31* 45* 35* 48*  CREATININE 4.92* 6.22* 4.88* 6.06*  CALCIUM 9.1 8.8* 9.1 8.5*  MG  --   --  1.6* 2.0     Liver Function Tests: Recent Labs  Lab 09/23/20 0845  AST 28  ALT 16  ALKPHOS 158*  BILITOT 1.1  PROT 9.2*  ALBUMIN 3.4*    No results for input(s): LIPASE, AMYLASE in the last 168 hours. No results for input(s): AMMONIA in the last 168 hours.  CBC: Recent Labs  Lab 09/23/20 0845 09/24/20 0436 09/25/20 0606 09/26/20 0552  WBC 17.9* 16.9* 11.4* 9.2  NEUTROABS 13.2*  --   --   --   HGB 12.8* 11.4* 11.1* 10.4*  HCT 39.1 33.7* 33.4* 30.9*  MCV 99.5 99.7 99.7 99.0  PLT 298 247 225 230     Cardiac Enzymes: No results for input(s): CKTOTAL, CKMB, CKMBINDEX, TROPONINI in the last 168 hours.  BNP: Invalid input(s): POCBNP  CBG: No results for input(s): GLUCAP in the last 168 hours.  Microbiology: Results for orders placed or performed  during the hospital encounter of 09/23/20  Resp Panel by RT-PCR (Flu A&B, Covid) Nasopharyngeal Swab     Status: None   Collection Time: 09/23/20  8:45 AM   Specimen: Nasopharyngeal Swab; Nasopharyngeal(NP) swabs in vial transport medium  Result Value Ref Range Status   SARS Coronavirus 2 by RT PCR NEGATIVE NEGATIVE Final    Comment: (NOTE) SARS-CoV-2 target nucleic acids are NOT DETECTED.  The SARS-CoV-2 RNA is generally detectable in upper respiratory specimens during the acute phase of infection. The  lowest concentration of SARS-CoV-2 viral copies this assay can detect is 138 copies/mL. A negative result does not preclude SARS-Cov-2 infection and should not be used as the sole basis for treatment or other patient management decisions. A negative result may occur with  improper specimen collection/handling, submission of specimen other than nasopharyngeal swab, presence of viral mutation(s) within the areas targeted by this assay, and inadequate number of viral copies(<138 copies/mL). A negative result must be combined with clinical observations, patient history, and epidemiological information. The expected result is Negative.  Fact Sheet for Patients:  EntrepreneurPulse.com.au  Fact Sheet for Healthcare Providers:  IncredibleEmployment.be  This test is no t yet approved or cleared by the [place] FDA and  has been authorized for detection and/or diagnosis of SARS-CoV-2 by FDA under an Emergency Use Authorization (EUA). This EUA will remain  in effect (meaning this test can be used) for the duration of the COVID-19 declaration under Section 564(b)(1) of the Act, 21 U.S.C.section 360bbb-3(b)(1), unless the authorization is terminated  or revoked sooner.       Influenza A by PCR NEGATIVE NEGATIVE Final   Influenza B by PCR NEGATIVE NEGATIVE Final    Comment: (NOTE) The Xpert Xpress SARS-CoV-2/FLU/RSV plus assay is intended as an aid in the diagnosis of influenza from Nasopharyngeal swab specimens and should not be used as a sole basis for treatment. Nasal washings and aspirates are unacceptable for Xpert Xpress SARS-CoV-2/FLU/RSV testing.  Fact Sheet for Patients: EntrepreneurPulse.com.au  Fact Sheet for Healthcare Providers: IncredibleEmployment.be  This test is not yet approved or cleared by the [place] FDA and has been authorized for detection and/or diagnosis of SARS-CoV-2 by FDA under  an Emergency Use Authorization (EUA). This EUA will remain in effect (meaning this test can be used) for the duration of the COVID-19 declaration under Section 564(b)(1) of the Act, 21 U.S.C. section 360bbb-3(b)(1), unless the authorization is terminated or revoked.  Performed at [facility], [address] [place], [place] [address]   Blood Culture (routine x 2)     Status: None (Preliminary result)   Collection Time: 09/23/20  8:45 AM   Specimen: BLOOD  Result Value Ref Range Status   Specimen Description BLOOD RIGHT [facility]  Final   Special Requests   Final    BOTTLES DRAWN AEROBIC AND ANAEROBIC Blood Culture adequate volume   Culture   Final    NO GROWTH 3 DAYS Performed at [facility], [address], [place], [place] [address]    Report Status PENDING  Incomplete  Blood Culture (routine x 2)     Status: None (Preliminary result)   Collection Time: 09/23/20  8:45 AM   Specimen: BLOOD  Result Value Ref Range Status   Specimen Description BLOOD RIGHT FA  Final   Special Requests   Final    BOTTLES DRAWN AEROBIC AND ANAEROBIC Blood Culture adequate volume   Culture   Final    NO GROWTH 3 DAYS Performed at  [facility], [address], [place], [place] [address]    Report Status PENDING  Incomplete    Coagulation Studies: Recent Labs    09/24/20 0436  LABPROT 15.4*  INR 1.2     Urinalysis: No results for input(s): COLORURINE, LABSPEC, PHURINE, GLUCOSEU, HGBUR, BILIRUBINUR, KETONESUR, PROTEINUR, UROBILINOGEN, NITRITE, LEUKOCYTESUR in the last 72 hours.  Invalid input(s): APPERANCEUR    Imaging: No results found.   Medications:    sodium chloride     sodium chloride     sodium chloride     ceFEPime (MAXIPIME) IV     vancomycin 1,000 mg (09/24/20 1223)    ALPRAZolam  0.5 mg Oral QHS   amiodarone  200 mg Oral Daily   atorvastatin  20 mg Oral Daily   Buprenorphine HCl  150 mcg Buccal BID   And   Buprenorphine HCl  300 mcg  Buccal BID   calcium acetate  1,334 mg Oral TID WC   Chlorhexidine Gluconate Cloth  6 each Topical Q0600   cinacalcet  60 mg Oral Q breakfast   clopidogrel  75 mg Oral Daily   cycloSPORINE  1 drop Both Eyes BID   docusate sodium  100 mg Oral Daily   DULoxetine  30 mg Oral Daily   fluticasone  2 spray Each Nare Daily   heparin  5,000 Units Subcutaneous Q8H   ipratropium  2 spray Each Nare TID   lactase  3,000 Units Oral TID WC   lidocaine  1 patch Transdermal Q12H   loratadine  10 mg Oral Daily   melatonin  5 mg Oral QHS   midodrine  15 mg Oral TID WC   mirtazapine  15 mg Oral QHS   multivitamin  1 tablet Oral QHS   pantoprazole  40 mg Oral Daily   pregabalin  50 mg Oral Daily   sodium chloride flush  3 mL Intravenous Q12H   sodium chloride flush  3 mL Intravenous Q12H   traZODone  100 mg Oral QHS   Vitamin D (Ergocalciferol)  50,000 Units Oral Weekly   sodium chloride, sodium chloride, sodium chloride, acetaminophen, ALPRAZolam, alteplase, guaiFENesin, heparin, ipratropium, lidocaine (PF), lidocaine-prilocaine, midodrine, nitroGLYCERIN, ondansetron **OR** ondansetron (ZOFRAN) IV, pentafluoroprop-tetrafluoroeth, polyvinyl alcohol, senna-docusate, sodium chloride flush  Assessment/ Plan:  William Carpenter is a 65 y.o.  male with previous medical conditions including dyslipidemia, coronary artery disease, peripheral artery disease, paroxysmal atrial fibrillation, and end-stage renal disease on dialysis. Patient admitted to [facility] with DVT (deep venous thrombosis) (HCC) [I82.409] Left leg cellulitis [L03.116] Sepsis (HCC) [A41.9] Pain and swelling of left lower leg [H06.237, M79.89] Sepsis without acute organ dysfunction, due to unspecified organism ([place]) [A41.9]  [facility]/Lt AVF  End stage renal disease on dialysis. Received full treatment yesterday, prior to admission. Receiving dialysis today. UF 1L achieved. Next treatment scheduled for Monday  Anemia of chronic kidney  disease Lab Results  Component Value Date   HGB 10.4 (L) 09/26/2020  Hgb within range   Secondary Hyperparathyroidism:   Lab Results  Component Value Date   CALCIUM 8.5 (L) 09/26/2020   PHOS 2.9 11/04/2018  Calcium slightly low Continue cinacalcet and calcium acetate  4. Sepsis from left lower extremity cellulitis: febrile, tachycardia Venous US negative for DVT, IV antibiotics. Patient and family prefer surgical intervention at [place].     LOS: 3 Chue Berkovich 9/23/202212:28 PM

## 2020-09-26 NOTE — Progress Notes (Signed)
Tx completed, no prolonged bleeding, 1.0kg removed.

## 2020-09-26 NOTE — Consult Note (Signed)
Pharmacy Antibiotic Note  William Carpenter is a 65 y.o. male with PMH of ESRD on hemodialysis (M/W/F), history of paroxysmal atrial fibrillation, dyslipidemia, coronary artery disease, peripheral arterial disease who was admitted on 09/23/2020 with cellulitis.  Pharmacy has been consulted for Vancomycin dosing. Today is day 4 of ABX.   Temp 100.2 on admin, afebrile since, Wbc 17.9>16.9>11.4>9.2, procal 7.82>8.22  Bcx have had NG x3 days  Plan: Vancomycin --Will continue vancomycin 1g IV on MWF following dialysis until 9/28  --Will obtain vancomycin levels after 4th or 5th dose  Cefepime --Will continue cefepime 1g IV daily following dialysis on dialysis days until 9/28  Stop dates have been placed on the orders  Will continue to monitor cultures and vancomycin levels and adjust dose as clinically indicated   Height: 5\' 9"  (175.3 cm) Weight: 90.4 kg (199 lb 4.7 oz) IBW/kg (Calculated) : 70.7  Temp (24hrs), Avg:98.3 F (36.8 C), Min:97.7 F (36.5 C), Max:98.9 F (37.2 C)  Recent Labs  Lab 09/23/20 0845 09/24/20 0436 09/25/20 0606 09/26/20 0552  WBC 17.9* 16.9* 11.4* 9.2  CREATININE 4.92* 6.22* 4.88* 6.06*  LATICACIDVEN 1.8  --   --   --      Estimated Creatinine Clearance: 13.5 mL/min (A) (by C-G formula based on SCr of 6.06 mg/dL (H)).    Allergies  Allergen Reactions   Albumin (Human) Hives   Erythromycin Hives   Iodinated Diagnostic Agents Hives   Iron Dextran    Metoprolol Tartrate    Penicillins Swelling   Quinine Sulfate [Quinine]    Rabeprazole    Sulfa Antibiotics Other (See Comments)   Tape Other (See Comments)    Removes skin.   Trimethoprim    Aloe      Antimicrobials this admission: Ongoing 9/20 Cefepime >>  9/20 Vancomycin >>   Completed 9/20 Metronidazole >> 9/22     Microbiology results: 9/20 Bcx: NG x2 days  Thank you for allowing pharmacy to be a part of this patient's care.  Narda Rutherford, PharmD Pharmacy Resident   09/26/2020 7:10 AM

## 2020-09-26 NOTE — Progress Notes (Signed)
PROGRESS NOTE    William Carpenter   EXN:[id number]  DOB: 07/04/1955  PCP: Raelyn Mora, MD    DOA: 09/23/2020 LOS: 3    Brief Narrative / Hospital Course to Date:   65 year old male with past medical history of ESRD on dialysis MWF, paroxysmal A. fib, hyperlipidemia, CAD, PAD who follows at the door [facility], presenting to the ER via EMS due to fever and shaking changes.  Patient was recently admitted at the [facility] for 3 weeks with sepsis secondary to left lower extremity cellulitis and discharged on doxycycline as prophylaxis for recurrence.  T-max at home 101F.  Patient noted progressive redness and swelling of extremity.  Admitted to [facility] service and started on IV antibiotics with vascular surgery consulted.  Patient and family prefer any surgical intervention to be performed at the [place].  Therefore being treated conservatively here with IV antibiotics.  Assessment & Plan   Principal Problem:   Sepsis ([place]) Active Problems:   ESRD on dialysis ([place])   Ischemic cardiomyopathy   PAF (paroxysmal atrial fibrillation) (HCC)   Cellulitis   PAD (peripheral artery disease) ([place])   Sepsis due to left lower extremity cellulitis, recurrent in the setting of severe peripheral artery disease -patient met sepsis criteria with fever, tachycardia, leukocytosis in the setting of cellulitis.  She left lower extremity Doppler negative for DVT. --Transition IV to PO antibiotics:  --D/CVancomycin, Cefepime --Start Omnicef, Doxycycline --Will monitor closely on PO antibiotics x24-48 given rapid onset of sepsis and recurrent infection --Stop Flagyl --Elevate left lower extremity --Follow blood cultures --Vascular surgery following, follow-up recommendations  Hypomagnesemia - replacing 2g IV Mg-sulfate for  Mg 1.6 this AM (9/22). 9/23: Resolved, Mg 2.0 --Monitor and replace as needed  History of paroxysmal A. Fib -currently rate controlled.  Continue amiodarone.  Patient not on  long-term anticoagulation due to increased risk of bleeding. --Monitor telemetry  ESRD -on hemodialysis Monday Wednesday Friday.  Nephrology following.  Monitor.  Constipation -bowel regimen per orders  Anxiety/depression -continue home Xanax, Remeron, Cymbalta and trazodone  Patient BMI: Body mass index is 29.43 kg/m.   DVT prophylaxis: heparin injection 5,000 Units Start: 09/23/20 1400   Diet:  Diet Orders (From admission, onward)     Start     Ordered   09/23/20 1147  Diet renal with fluid restriction Fluid restriction: 1200 mL Fluid; Room service appropriate? Yes; Fluid consistency: Thin  Diet effective now       Question Answer Comment  Fluid restriction: 1200 mL Fluid   Room service appropriate? Yes   Fluid consistency: Thin      09/23/20 1149              Code Status: DNR   Subjective 09/26/20    Patient seen in dialysis today.  He reports left foot pain but no other acute complaints.  He denies fevers or chills.  Still has not had a BM however denies abdominal pain nausea or vomiting.   Disposition Plan & Communication   Status is: Inpatient  Remains inpatient appropriate because:IV treatments appropriate due to intensity of illness or inability to take PO.  Potential DC home tomorrow if clinically stable on oral antibiotics  Dispo: The patient is from: Home              Anticipated d/c is to: Home              Patient currently is not medically stable to d/c.   Difficult to place patient  No  Family communication: Daughter Juliann Pulse updated by phone this afternoon 9/23.  Able with plan to monitor on oral antibiotics and hopeful discharge tomorrow.   Consults, Procedures, Significant Events   Consultants:  Vascular surgery Nephrology  Procedures:  Hemodialysis  Antimicrobials:  Anti-infectives (From admission, onward)    Start     Dose/Rate Route Frequency Ordered Stop   09/26/20 1600  ceFEPIme (MAXIPIME) 1 g in sodium chloride 0.9 % 100 mL IVPB   Status:  Discontinued        1 g 200 mL/hr over 30 Minutes Intravenous Every 24 hours 09/25/20 1029 09/26/20 1428   09/26/20 1515  doxycycline (VIBRA-TABS) tablet 100 mg        100 mg Oral Every 12 hours 09/26/20 1428     09/26/20 1515  cefdinir (OMNICEF) capsule 300 mg        300 mg Oral Every 12 hours 09/26/20 1428     09/24/20 1200  vancomycin (VANCOCIN) IVPB 1000 mg/200 mL premix  Status:  Discontinued        1,000 mg 200 mL/hr over 60 Minutes Intravenous Every M-W-F (Hemodialysis) 09/23/20 1741 09/23/20 1743   09/24/20 1200  vancomycin (VANCOCIN) IVPB 1000 mg/200 mL premix  Status:  Discontinued        1,000 mg 200 mL/hr over 60 Minutes Intravenous Every M-W-F (Hemodialysis) 09/23/20 1743 09/26/20 1428   09/24/20 0800  ceFEPIme (MAXIPIME) 2 g in sodium chloride 0.9 % 100 mL IVPB  Status:  Discontinued        2 g 200 mL/hr over 30 Minutes Intravenous Every 24 hours 09/23/20 1530 09/23/20 1607   09/24/20 0800  ceFEPIme (MAXIPIME) 1 g in sodium chloride 0.9 % 100 mL IVPB  Status:  Discontinued        1 g 200 mL/hr over 30 Minutes Intravenous Every 24 hours 09/23/20 1607 09/25/20 1029   09/23/20 2100  metroNIDAZOLE (FLAGYL) IVPB 500 mg  Status:  Discontinued        500 mg 100 mL/hr over 60 Minutes Intravenous Every 12 hours 09/23/20 1149 09/25/20 0834   09/23/20 1530  vancomycin (VANCOCIN) IVPB 1000 mg/200 mL premix        1,000 mg 200 mL/hr over 60 Minutes Intravenous  Once 09/23/20 1518 09/24/20 1330   09/23/20 1200  ceFEPIme (MAXIPIME) 2 g in sodium chloride 0.9 % 100 mL IVPB  Status:  Discontinued        2 g 200 mL/hr over 30 Minutes Intravenous  Once 09/23/20 1149 09/23/20 1156   09/23/20 1200  vancomycin (VANCOCIN) IVPB 1000 mg/200 mL premix  Status:  Discontinued        1,000 mg 200 mL/hr over 60 Minutes Intravenous  Once 09/23/20 1149 09/23/20 1225   09/23/20 0900  ceFEPIme (MAXIPIME) 2 g in sodium chloride 0.9 % 100 mL IVPB        2 g 200 mL/hr over 30 Minutes Intravenous   Once 09/23/20 0850 09/23/20 0930   09/23/20 0900  metroNIDAZOLE (FLAGYL) IVPB 500 mg        500 mg 100 mL/hr over 60 Minutes Intravenous  Once 09/23/20 0850 09/23/20 1035   09/23/20 0900  vancomycin (VANCOCIN) IVPB 1000 mg/200 mL premix        1,000 mg 200 mL/hr over 60 Minutes Intravenous  Once 09/23/20 0850 09/23/20 1035         Micro    Objective   Vitals:   09/26/20 1100 09/26/20 1115 09/26/20 1130 09/26/20 1217  BP: 130/89 (!) 123/105 120/82   Pulse: 84 88 76 82  Resp: 11 20 17 16   Temp:   99 F (37.2 C)   TempSrc:   Oral   SpO2: 98% 99% 100%   Weight:      Height:        Intake/Output Summary (Last 24 hours) at 09/26/2020 1431 Last data filed at 09/26/2020 1130 Gross per 24 hour  Intake 240 ml  Output 1000 ml  Net -760 ml   Filed Weights   09/23/20 0834 09/24/20 1952  Weight: 90 kg 90.4 kg    Physical Exam:  General exam: awake with eyes closed, no acute distress Respiratory system: CTAB, no wheezes, rales or rhonchi, normal respiratory effort. Cardiovascular system: normal S1/S2, RRR.   Central nervous system: A&O x3. no gross focal neurologic deficits, normal speech Extremities: Left lower extremity with tense edema, warmth to touch and dark erythema, normal tone Psychiatry: normal mood, congruent affect, judgement and insight appear normal  Labs   Data Reviewed: I have personally reviewed following labs and imaging studies  CBC: Recent Labs  Lab 09/23/20 0845 09/24/20 0436 09/25/20 0606 09/26/20 0552  WBC 17.9* 16.9* 11.4* 9.2  NEUTROABS 13.2*  --   --   --   HGB 12.8* 11.4* 11.1* 10.4*  HCT 39.1 33.7* 33.4* 30.9*  MCV 99.5 99.7 99.7 99.0  PLT 298 247 225 812   Basic Metabolic Panel: Recent Labs  Lab 09/23/20 0845 09/24/20 0436 09/25/20 0606 09/26/20 0552  NA 135 133* 131* 131*  K 4.2 4.3 3.9 4.1  CL 92* 92* 88* 90*  CO2 28 28 26 26   GLUCOSE 96 92 77 79  BUN 31* 45* 35* 48*  CREATININE 4.92* 6.22* 4.88* 6.06*  CALCIUM 9.1 8.8*  9.1 8.5*  MG  --   --  1.6* 2.0  PHOS  --   --   --  5.2*   GFR: Estimated Creatinine Clearance: 13.5 mL/min (A) (by C-G formula based on SCr of 6.06 mg/dL (H)). Liver Function Tests: Recent Labs  Lab 09/23/20 0845  AST 28  ALT 16  ALKPHOS 158*  BILITOT 1.1  PROT 9.2*  ALBUMIN 3.4*   No results for input(s): LIPASE, AMYLASE in the last 168 hours. No results for input(s): AMMONIA in the last 168 hours. Coagulation Profile: Recent Labs  Lab 09/23/20 0845 09/24/20 0436  INR 1.0 1.2   Cardiac Enzymes: No results for input(s): CKTOTAL, CKMB, CKMBINDEX, TROPONINI in the last 168 hours. BNP (last 3 results) No results for input(s): PROBNP in the last 8760 hours. HbA1C: No results for input(s): HGBA1C in the last 72 hours. CBG: No results for input(s): GLUCAP in the last 168 hours. Lipid Profile: No results for input(s): CHOL, HDL, LDLCALC, TRIG, CHOLHDL, LDLDIRECT in the last 72 hours. Thyroid Function Tests: No results for input(s): TSH, T4TOTAL, FREET4, T3FREE, THYROIDAB in the last 72 hours. Anemia Panel: No results for input(s): VITAMINB12, FOLATE, FERRITIN, TIBC, IRON, RETICCTPCT in the last 72 hours. Sepsis Labs: Recent Labs  Lab 09/23/20 0845 09/24/20 0436 09/25/20 0606 09/26/20 0552  PROCALCITON  --  6.60 7.82 8.22  LATICACIDVEN 1.8  --   --   --     Recent Results (from the past 240 hour(s))  Resp Panel by RT-PCR (Flu A&B, Covid) Nasopharyngeal Swab     Status: None   Collection Time: 09/23/20  8:45 AM   Specimen: Nasopharyngeal Swab; Nasopharyngeal(NP) swabs in vial transport medium  Result Value Ref Range  Status   SARS Coronavirus 2 by RT PCR NEGATIVE NEGATIVE Final    Comment: (NOTE) SARS-CoV-2 target nucleic acids are NOT DETECTED.  The SARS-CoV-2 RNA is generally detectable in upper respiratory specimens during the acute phase of infection. The lowest concentration of SARS-CoV-2 viral copies this assay can detect is 138 copies/mL. A negative  result does not preclude SARS-Cov-2 infection and should not be used as the sole basis for treatment or other patient management decisions. A negative result may occur with  improper specimen collection/handling, submission of specimen other than nasopharyngeal swab, presence of viral mutation(s) within the areas targeted by this assay, and inadequate number of viral copies(<138 copies/mL). A negative result must be combined with clinical observations, patient history, and epidemiological information. The expected result is Negative.  Fact Sheet for Patients:  EntrepreneurPulse.com.au  Fact Sheet for Healthcare Providers:  IncredibleEmployment.be  This test is no t yet approved or cleared by the [place] FDA and  has been authorized for detection and/or diagnosis of SARS-CoV-2 by FDA under an Emergency Use Authorization (EUA). This EUA will remain  in effect (meaning this test can be used) for the duration of the COVID-19 declaration under Section 564(b)(1) of the Act, 21 U.S.C.section 360bbb-3(b)(1), unless the authorization is terminated  or revoked sooner.       Influenza A by PCR NEGATIVE NEGATIVE Final   Influenza B by PCR NEGATIVE NEGATIVE Final    Comment: (NOTE) The Xpert Xpress SARS-CoV-2/FLU/RSV plus assay is intended as an aid in the diagnosis of influenza from Nasopharyngeal swab specimens and should not be used as a sole basis for treatment. Nasal washings and aspirates are unacceptable for Xpert Xpress SARS-CoV-2/FLU/RSV testing.  Fact Sheet for Patients: EntrepreneurPulse.com.au  Fact Sheet for Healthcare Providers: IncredibleEmployment.be  This test is not yet approved or cleared by the [place] FDA and has been authorized for detection and/or diagnosis of SARS-CoV-2 by FDA under an Emergency Use Authorization (EUA). This EUA will remain in effect (meaning this test can be used)  for the duration of the COVID-19 declaration under Section 564(b)(1) of the Act, 21 U.S.C. section 360bbb-3(b)(1), unless the authorization is terminated or revoked.  Performed at [facility], [address] [place], [place] [address]   Blood Culture (routine x 2)     Status: None (Preliminary result)   Collection Time: 09/23/20  8:45 AM   Specimen: BLOOD  Result Value Ref Range Status   Specimen Description BLOOD RIGHT [employer]  Final   Special Requests   Final    BOTTLES DRAWN AEROBIC AND ANAEROBIC Blood Culture adequate volume   Culture   Final    NO GROWTH 3 DAYS Performed at [facility], [address], [place], [place] [address]    Report Status PENDING  Incomplete  Blood Culture (routine x 2)     Status: None (Preliminary result)   Collection Time: 09/23/20  8:45 AM   Specimen: BLOOD  Result Value Ref Range Status   Specimen Description BLOOD RIGHT FA  Final   Special Requests   Final    BOTTLES DRAWN AEROBIC AND ANAEROBIC Blood Culture adequate volume   Culture   Final    NO GROWTH 3 DAYS Performed at [facility], [address], [place], [place] [address]    Report Status PENDING  Incomplete      Imaging Studies   No results found.   Medications   Scheduled Meds:  ALPRAZolam  0.5 mg Oral QHS  amiodarone  200 mg Oral Daily   atorvastatin  20 mg Oral Daily   Buprenorphine HCl  150 mcg Buccal BID   And   Buprenorphine HCl  300 mcg Buccal BID   calcium acetate  1,334 mg Oral TID WC   cefdinir  300 mg Oral Q12H   Chlorhexidine Gluconate Cloth  6 each Topical Q0600   cinacalcet  60 mg Oral Q breakfast   clopidogrel  75 mg Oral Daily   cycloSPORINE  1 drop Both Eyes BID   docusate sodium  100 mg Oral Daily   doxycycline  100 mg Oral Q12H   DULoxetine  30 mg Oral Daily   fluticasone  2 spray Each Nare Daily   heparin  5,000 Units Subcutaneous Q8H   ipratropium  2 spray Each Nare TID   lactase  3,000 Units Oral TID WC    lidocaine  1 patch Transdermal Q12H   loratadine  10 mg Oral Daily   melatonin  5 mg Oral QHS   midodrine  15 mg Oral TID WC   mirtazapine  15 mg Oral QHS   multivitamin  1 tablet Oral QHS   pantoprazole  40 mg Oral Daily   pregabalin  50 mg Oral Daily   senna-docusate  1 tablet Oral BID   sodium chloride flush  3 mL Intravenous Q12H   sodium chloride flush  3 mL Intravenous Q12H   traZODone  100 mg Oral QHS   Vitamin D (Ergocalciferol)  50,000 Units Oral Weekly   Continuous Infusions:  sodium chloride         LOS: 3 days    Time spent: 30 minutes with > 50% spent at bedside and in coordination of care     Ezekiel Slocumb, DO [facility]  09/26/2020, 2:31 PM      If 7PM-7AM, please contact night-coverage. How to contact the [facility] Attending or Consulting provider [place] or covering provider during after hours [place], for this patient?    Check the care team in [facility] and look for a) attending/consulting TRH provider listed and b) the [facility] team listed Log into [email or web address] and use [facility]'s universal password to access. If you do not have the password, please contact the hospital operator. Locate the [facility] provider you are looking for under [facility] and page to a number that you can be directly reached. If you still have difficulty reaching the provider, please page the [facility] (Director on Call) for the Hospitalists listed on amion for assistance.

## 2020-09-26 NOTE — TOC Initial Note (Signed)
Transition of Care [facility]) - Initial/Assessment Note    Patient Details  Name: William Carpenter MRN: [id number] Date of Birth: 26-May-1955  [facility]) CM/SW Contact:    Alberteen Sam, LCSW Phone Number: 09/26/2020, 3:06 PM  Clinical Narrative:                  CSW met with patient who requests CSW call his daughter Juliann Pulse.   CSW spoke with Juliann Pulse regarding discharge planning. Juliann Pulse reports patient is already active with [facility] with PT and RN. Would like to resume services at dc.   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   Corene Cornea with [facility] informed of above.   No other discharge needs identified at this time.    Expected Discharge Plan: [place] Barriers to Discharge: Continued Medical Work up   Patient Goals and CMS Choice Patient states their goals for this hospitalization and ongoing recovery are:: to go home CMS Medicare.gov Compare Post Acute Care list provided to:: Patient Represenative (must comment) (daughter Juliann Pulse) Choice offered to / list presented to : Adult Children  Expected Discharge Plan and Services Expected Discharge Plan: [place] Choice: [place] arrangements for the past 2 months: Single Family Home                           HH Arranged: PT, RN [facility] Agency: [place] ([place]) Date HH Agency Contacted: 09/26/20 Time [place]: 1506 Representative spoke with at [place]: Corene Cornea  Prior Living Arrangements/Services Living arrangements for the past 2 months: [place]     Do you feel safe going back to the place where you live?: Yes               Activities of Daily Living Home Assistive Devices/Equipment: Environmental consultant (specify type) ADL Screening (condition at time of admission) Patient's cognitive ability adequate to safely complete daily activities?:  Yes Is the patient deaf or have difficulty hearing?: Yes Does the patient have difficulty seeing, even when wearing glasses/contacts?: Yes Does the patient have difficulty concentrating, remembering, or making decisions?: No Patient able to express need for assistance with ADLs?: Yes Does the patient have difficulty dressing or bathing?: Yes Independently performs ADLs?: No Communication: Independent Dressing (OT): Appropriate for developmental age Grooming: Appropriate for developmental age Feeding: Independent Bathing: Needs assistance Is this a change from baseline?: Pre-admission baseline Toileting: Independent In/Out Bed: Needs assistance Is this a change from baseline?: Pre-admission baseline Walks in Home: Needs assistance Is this a change from baseline?: Pre-admission baseline Does the patient have difficulty walking or climbing stairs?: Yes Weakness of Legs: Both Weakness of Arms/Hands: None  Permission Sought/Granted                  Emotional Assessment Appearance:: Appears stated age     Orientation: : Oriented to Self, Oriented to Place, Oriented to  Time, Oriented to Situation Alcohol / Substance Use: Not Applicable Psych Involvement: No (comment)  Admission diagnosis:  DVT (deep venous thrombosis) (HCC) [I82.409] Left leg cellulitis [L03.116] Sepsis ([place]) [A41.9] Pain and swelling of left lower leg [F81.017, M79.89] Sepsis without acute organ dysfunction, due to unspecified organism [facility]) [A41.9] Patient Active Problem List   Diagnosis Date Noted   Sepsis ([place]) 09/23/2020   PAF (  paroxysmal atrial fibrillation) (HCC)    Cellulitis    PAD (peripheral artery disease) (HCC)    Chest pain of uncertain etiology    Ischemic cardiomyopathy    STEMI (ST elevation myocardial infarction) ([place]) 10/30/2018   STEMI involving left anterior descending coronary artery ([place]) 10/30/2018   Pressure injury of skin 11/23/2017   ESRD on dialysis ([place]) 11/22/2017   HLD  (hyperlipidemia) 11/22/2017   Intramuscular hematoma 11/22/2017   Acalculous cholecystitis    Leukocytosis 07/12/2016   Hypotension 07/12/2016   PCP:  Raelyn Mora, MD Pharmacy:   [facility] [place] [address] [place] [address] Phone: [phone] Fax: [phone]     Social Determinants of Health ([place]) Interventions    Readmission Risk Interventions Readmission Risk Prevention Plan 11/05/2018  Transportation Screening Complete  HRI or [place] Complete  Social Work Consult for [facility] Planning/Counseling Complete  Palliative Care Screening Not Applicable  Medication Review Press photographer) Complete  Some recent data might be hidden

## 2020-09-26 NOTE — Progress Notes (Signed)
PT Cancellation Note  Patient Details Name: William Carpenter MRN: [id number] DOB: 1955/06/06   Cancelled Treatment:    Reason Eval/Treat Not Completed: Other (comment). Pt out of room at this time, PT to follow up as able.  Lieutenant Diego PT, DPT 9:32 AM,09/26/20

## 2020-09-26 NOTE — Progress Notes (Signed)
Tx Initiated; T/B+ cannulated successfully,  UFR goal of 1kg.

## 2020-09-26 NOTE — Progress Notes (Signed)
PHARMACY NOTE:  ANTIMICROBIAL RENAL DOSAGE ADJUSTMENT  Current antimicrobial regimen includes a mismatch between antimicrobial dosage and estimated renal function.  As per policy approved by the Pharmacy & Therapeutics and Medical Executive Committees, the antimicrobial dosage will be adjusted accordingly.  Current antimicrobial dosage:  cefdinir 300 mg BID   Indication: Cellulitis   Renal Function:  Estimated Creatinine Clearance: 13.5 mL/min (A) (by C-G formula based on SCr of 6.06 mg/dL (H)). []      On intermittent HD, scheduled: []      On CRRT    Antimicrobial dosage has been changed to:  cefdinir 300 mg QD   Additional comments:   Thank you for allowing pharmacy to be a part of this patient's care.  [place], [facility] 09/26/2020 2:33 PM

## 2020-09-26 NOTE — Care Management Important Message (Signed)
Important Message  Patient Details  Name: William Carpenter MRN: [id number] Date of Birth: 1955-02-09   Medicare Important Message Given:  Yes     Dannette Barbara 09/26/2020, 1:40 PM

## 2020-09-27 ENCOUNTER — Inpatient Hospital Stay: Payer: Medicare Other

## 2020-09-27 DIAGNOSIS — N186 End stage renal disease: Secondary | ICD-10-CM | POA: Diagnosis not present

## 2020-09-27 DIAGNOSIS — L03116 Cellulitis of left lower limb: Secondary | ICD-10-CM | POA: Diagnosis not present

## 2020-09-27 DIAGNOSIS — A419 Sepsis, unspecified organism: Secondary | ICD-10-CM | POA: Diagnosis not present

## 2020-09-27 DIAGNOSIS — I739 Peripheral vascular disease, unspecified: Secondary | ICD-10-CM | POA: Diagnosis not present

## 2020-09-27 LAB — VITAMIN D 25 HYDROXY (VIT D DEFICIENCY, FRACTURES): Vit D, 25-Hydroxy: 57.94 ng/mL (ref 30–100)

## 2020-09-27 LAB — MAGNESIUM: Magnesium: 1.9 mg/dL (ref 1.7–2.4)

## 2020-09-27 LAB — CBC
HCT: 32.6 % — ABNORMAL LOW (ref 39.0–52.0)
Hemoglobin: 10.8 g/dL — ABNORMAL LOW (ref 13.0–17.0)
MCH: 33.1 pg (ref 26.0–34.0)
MCHC: 33.1 g/dL (ref 30.0–36.0)
MCV: 100 fL (ref 80.0–100.0)
Platelets: 233 10*3/uL (ref 150–400)
RBC: 3.26 MIL/uL — ABNORMAL LOW (ref 4.22–5.81)
RDW: 15.9 % — ABNORMAL HIGH (ref 11.5–15.5)
WBC: 8.4 10*3/uL (ref 4.0–10.5)
nRBC: 0 % (ref 0.0–0.2)

## 2020-09-27 LAB — BASIC METABOLIC PANEL
Anion gap: 14 (ref 5–15)
BUN: 36 mg/dL — ABNORMAL HIGH (ref 8–23)
CO2: 29 mmol/L (ref 22–32)
Calcium: 8.8 mg/dL — ABNORMAL LOW (ref 8.9–10.3)
Chloride: 91 mmol/L — ABNORMAL LOW (ref 98–111)
Creatinine, Ser: 5 mg/dL — ABNORMAL HIGH (ref 0.61–1.24)
GFR, Estimated: 12 mL/min — ABNORMAL LOW (ref >60)
Glucose, Bld: 85 mg/dL (ref 70–99)
Potassium: 3.9 mmol/L (ref 3.5–5.1)
Sodium: 134 mmol/L — ABNORMAL LOW (ref 135–145)

## 2020-09-27 LAB — TSH: TSH: 2.275 u[IU]/mL (ref 0.350–4.500)

## 2020-09-27 LAB — MRSA NEXT GEN BY PCR, NASAL: MRSA by PCR Next Gen: NOT DETECTED

## 2020-09-27 MED ORDER — BISACODYL 10 MG RE SUPP: 10.0000 mg | Freq: Every day | RECTAL | Status: DC | PRN

## 2020-09-27 MED ORDER — LACTULOSE 10 GM/15ML PO SOLN
20.0000 g | Freq: Every day | ORAL | Status: DC | PRN
  Administered 2020-09-27: 20 g via ORAL
  Filled 2020-09-27: qty 30

## 2020-09-27 NOTE — Progress Notes (Signed)
Pt given dose of lactulose and PO bisacodyl this afternoon. No BM as of this time. Will continue to monitor.

## 2020-09-27 NOTE — Progress Notes (Signed)
PROGRESS NOTE    William Carpenter   KSM:[id number]  DOB: 1955/03/21  PCP: Raelyn Mora, MD    DOA: 09/23/2020 LOS: 4    Brief Narrative / Hospital Course to Date:   65 year old male with past medical history of ESRD on dialysis MWF, paroxysmal A. fib, hyperlipidemia, CAD, PAD who follows at the door [facility], presenting to the ER via EMS due to fever and shaking changes.  Patient was recently admitted at the [facility] for 3 weeks with sepsis secondary to left lower extremity cellulitis and discharged on doxycycline as prophylaxis for recurrence.  T-max at home 101F.  Patient noted progressive redness and swelling of extremity.  Admitted to [facility] service and started on IV antibiotics with vascular surgery consulted.  Patient and family prefer any surgical intervention to be performed at the [place].  Therefore being treated conservatively here with IV antibiotics.  Assessment & Plan   Principal Problem:   Sepsis ([place]) Active Problems:   ESRD on dialysis ([place])   Ischemic cardiomyopathy   PAF (paroxysmal atrial fibrillation) ([facility])   Cellulitis   PAD (peripheral artery disease) ([place])   Sepsis due to left lower extremity cellulitis, recurrent in the setting of severe peripheral artery disease -patient met sepsis criteria with fever, tachycardia, leukocytosis in the setting of cellulitis.  She left lower extremity Doppler negative for DVT. --Transition IV to PO antibiotics:  --D/CVancomycin, Cefepime -- Continue Omnicef, Doxycycline --Will monitor closely on PO antibiotics x24-48 given rapid onset of sepsis and recurrent infection --Stop Flagyl --Elevate left lower extremity --Follow blood cultures --Vascular surgery following, follow-up recommendations 9/24: Worsening left lower extremity pain but no fevers or chills. --Repeat left lower extremity Doppler ultrasound to rule out DVT.  Hypomagnesemia - replacing 2g IV Mg-sulfate for  Mg 1.6 this AM (9/22). 9/23: Resolved, Mg  2.0 --Monitor and replace as needed  History of paroxysmal A. Fib -currently rate controlled.  Continue amiodarone.  Patient not on long-term anticoagulation due to increased risk of bleeding. --Monitor telemetry  ESRD -on hemodialysis Monday Wednesday Friday.  Nephrology following.  Monitor.  Constipation -chronic.  Last BM was _0 /20/22 1149              Code Status: DNR   Subjective 09/27/20    Patient seen seen on rounds this morning reports worsening left lower extremity pain that now radiates to the medial thigh.  No fevers or chills.  He reports still not having any bowel movement has been over 4 days.    Disposition Plan & Communication   Status is: Inpatient  Remains inpatient appropriate because: worsening LLE pain requiring further evaluation and monitoring.  Potential DC home tomorrow.  Dispo: The patient is from: Home  Anticipated d/c is to: Home              Patient currently is not medically stable to d/c.   Difficult to place patient No  Family communication: Daughter Juliann Pulse updated by phone this afternoon 9/24.     Consults, Procedures, Significant Events   Consultants:  Vascular surgery Nephrology  Procedures:   Hemodialysis  Antimicrobials:  Anti-infectives (From admission, onward)    Start     Dose/Rate Route Frequency Ordered Stop   09/26/20 1600  ceFEPIme (MAXIPIME) 1 g in sodium chloride 0.9 % 100 mL IVPB  Status:  Discontinued        1 g 200 mL/hr over 30 Minutes Intravenous Every 24 hours 09/25/20 1029 09/26/20 1428   09/26/20 1530  cefdinir (OMNICEF) capsule 300 mg        300 mg Oral Daily 09/26/20 1438     09/26/20 1515  doxycycline (VIBRA-TABS) tablet 100 mg        100 mg Oral Every 12 hours 09/26/20 1428     09/26/20 1515  cefdinir (OMNICEF) capsule 300 mg  Status:  Discontinued        300 mg Oral Every 12 hours 09/26/20 1428 09/26/20 1437   09/24/20 1200  vancomycin (VANCOCIN) IVPB 1000 mg/200 mL premix  Status:  Discontinued        1,000 mg 200 mL/hr over 60 Minutes Intravenous Every M-W-F (Hemodialysis) 09/23/20 1741 09/23/20 1743   09/24/20 1200  vancomycin (VANCOCIN) IVPB 1000 mg/200 mL premix  Status:  Discontinued        1,000 mg 200 mL/hr over 60 Minutes Intravenous Every M-W-F (Hemodialysis) 09/23/20 1743 09/26/20 1428   09/24/20 0800  ceFEPIme (MAXIPIME) 2 g in sodium chloride 0.9 % 100 mL IVPB  Status:  Discontinued        2 g 200 mL/hr over 30 Minutes Intravenous Every 24 hours 09/23/20 1530 09/23/20 1607   09/24/20 0800  ceFEPIme (MAXIPIME) 1 g in sodium chloride 0.9 % 100 mL IVPB  Status:  Discontinued        1 g 200 mL/hr over 30 Minutes Intravenous Every 24 hours 09/23/20 1607 09/25/20 1029   09/23/20 2100  metroNIDAZOLE (FLAGYL) IVPB 500 mg  Status:  Discontinued        500 mg 100 mL/hr over 60 Minutes Intravenous Every 12 hours 09/23/20 1149 09/25/20 0834   09/23/20 1530  vancomycin (VANCOCIN) IVPB 1000 mg/200 mL premix        1,000 mg 200 mL/hr over 60 Minutes Intravenous  Once 09/23/20 1518 09/24/20 1330   09/23/20 1200  ceFEPIme (MAXIPIME) 2 g in sodium chloride 0.9 % 100 mL IVPB  Status:  Discontinued        2 g 200 mL/hr over 30 Minutes Intravenous  Once  09/23/20 1149 09/23/20 1156   09/23/20 1200  vancomycin (VANCOCIN) IVPB 1000 mg/200 mL premix  Status:  Discontinued        1,000 mg 200 mL/hr over 60 Minutes Intravenous  Once 09/23/20 1149 09/23/20 1225   09/23/20 0900  ceFEPIme (MAXIPIME) 2 g in sodium chloride 0.9 % 100 mL IVPB        2 g 200 mL/hr over 30 Minutes Intravenous  Once 09/23/20 0850 09/23/20 0930   09/23/20 0900  metroNIDAZOLE (FLAGYL) IVPB 500 mg        500 mg 100 mL/hr over 60 Minutes Intravenous  Once 09/23/20 0850 09/23/20 1035   09/23/20 0900  vancomycin (VANCOCIN) IVPB 1000 mg/200  mL premix        1,000 mg 200 mL/hr over 60 Minutes Intravenous  Once 09/23/20 0850 09/23/20 1035         Micro    Objective   Vitals:   09/27/20 0014 09/27/20 0421 09/27/20 0751 09/27/20 1210  BP: (!) 88/61 118/84 99/74 105/69  Pulse: 80 82 86 80  Resp: _0 Temp: 98.3 F (36.8 C) 98.7 F (37.1 C) 98.6 F (37 C) 98.2 F (36.8 C)  TempSrc:   Oral Oral  SpO2: 93% 94% 94% 94%  Weight:      Height:        Intake/Output Summary (Last 24 hours) at 09/27/2020 1309 Last data filed at 09/27/2020 0950 Gross per 24 hour  Intake 720 ml  Output --  Net 720 ml   Filed Weights   09/23/20 0834 09/24/20 1952  Weight: 90 kg 90.4 kg    Physical Exam:  General exam: awake with eyes closed, no acute distress Respiratory system: CTAB, no wheezes, rales or rhonchi, normal respiratory effort. Cardiovascular system: normal S1/S2, RRR.   Central nervous system: A&O x3. no gross focal neurologic deficits, normal speech Extremities: Left lower extremity improved edema, no longer with differential warmth, skin progressively dark on the LLE distally Psychiatry: normal mood, congruent affect, judgement and insight appear normal  Labs   Data Reviewed: I have personally reviewed following labs and imaging studies  CBC: Recent Labs  Lab 09/23/20 0845 09/24/20 0436 09/25/20 0606 09/26/20 0552 09/27/20 0708  WBC 17.9* 16.9*  11.4* 9.2 8.4  NEUTROABS 13.2*  --   --   --   --   HGB 12.8* 11.4* 11.1* 10.4* 10.8*  HCT 39.1 33.7* 33.4* 30.9* 32.6*  MCV 99.5 99.7 99.7 99.0 100.0  PLT 298 247 225 230 478   Basic Metabolic Panel: Recent Labs  Lab 09/23/20 0845 09/24/20 0436 09/25/20 0606 09/26/20 0552 09/27/20 0708  NA 135 133* 131* 131* 134*  K 4.2 4.3 3.9 4.1 3.9  CL 92* 92* 88* 90* 91*  CO2 _1 GLUCOSE 96 92 77 79 85  BUN 31* 45* 35* 48* 36*  CREATININE 4.92* 6.22* 4.88* 6.06* 5.00*  CALCIUM 9.1 8.8* 9.1 8.5* 8.8*  MG  --   --  1.6* 2.0 1.9  PHOS  --   --   --  5.2*  --    GFR: Estimated Creatinine Clearance: 16.4 mL/min (A) (by C-G formula based on SCr of 5 mg/dL (H)). Liver Function Tests: Recent Labs  Lab 09/23/20 0845  AST 28  ALT 16  ALKPHOS 158*  BILITOT 1.1  PROT 9.2*  ALBUMIN 3.4*   No results for input(s): LIPASE, AMYLASE in the last 168 hours. No results for input(s): AMMONIA in the last 168 hours. Coagulation Profile: Recent Labs  Lab 09/23/20 0845 09/24/20 0436  INR 1.0 1.2   Cardiac Enzymes: No results for input(s): CKTOTAL, CKMB, CKMBINDEX, TROPONINI in the last 168 hours. BNP (last 3 results) No results for input(s): PROBNP in the last 8760 hours. HbA1C: No results for input(s): HGBA1C in the last 72 hours. CBG: No results for input(s): GLUCAP in the last 168 hours. Lipid Profile: No results for input(s): CHOL, HDL, LDLCALC, TRIG, CHOLHDL, LDLDIRECT in the last 72 hours. Thyroid Function Tests: No results for input(s): TSH, T4TOTAL, FREET4, T3FREE, THYROIDAB in the last 72 hours. Anemia Panel: No results for input(s): VITAMINB12, FOLATE, FERRITIN, TIBC, IRON, RETICCTPCT in the last 72  hours. Sepsis Labs: Recent Labs  Lab 09/23/20 0845 09/24/20 0436 09/25/20 0606 09/26/20 0552  PROCALCITON  --  6.60 7.82 8.22  LATICACIDVEN 1.8  --   --   --     Recent Results (from the past 240 hour(s))  Resp Panel by RT-PCR (Flu A&B, Covid) Nasopharyngeal  Swab     Status: None   Collection Time: 09/23/20  8:45 AM   Specimen: Nasopharyngeal Swab; Nasopharyngeal(NP) swabs in vial transport medium  Result Value Ref Range Status   SARS Coronavirus 2 by RT PCR NEGATIVE NEGATIVE Final    Comment: (NOTE) SARS-CoV-2 target nucleic acids are NOT DETECTED.  The SARS-CoV-2 RNA is generally detectable in upper respiratory specimens during the acute phase of infection. The lowest concentration of SARS-CoV-2 viral copies this assay can detect is 138 copies/mL. A negative result does not preclude SARS-Cov-2 infection and should not be used as the sole basis for treatment or other patient management decisions. A negative result may occur with  improper specimen collection/handling, submission of specimen other than nasopharyngeal swab, presence of viral mutation(s) within the areas targeted by this assay, and inadequate number of viral copies(<138 copies/mL). A negative result must be combined with clinical observations, patient history, and epidemiological information. The expected result is Negative.  Fact Sheet for Patients:  EntrepreneurPulse.com.au  Fact Sheet for Healthcare Providers:  IncredibleEmployment.be  This test is no t yet approved or cleared by the [place] FDA and  has been authorized for detection and/or diagnosis of SARS-CoV-2 by FDA under an Emergency Use Authorization (EUA). This EUA will remain  in effect (meaning this test can be used) for the duration of the COVID-19 declaration under Section 564(b)(1) of the Act, 21 U.S.C.section 360bbb-3(b)(1), unless the authorization is terminated  or revoked sooner.       Influenza A by PCR NEGATIVE NEGATIVE Final   Influenza B by PCR NEGATIVE NEGATIVE Final    Comment: (NOTE) The Xpert Xpress SARS-CoV-2/FLU/RSV plus assay is intended as an aid in the diagnosis of influenza from Nasopharyngeal swab specimens and should not be used as a sole  basis for treatment. Nasal washings and aspirates are unacceptable for Xpert Xpress SARS-CoV-2/FLU/RSV testing.  Fact Sheet for Patients: EntrepreneurPulse.com.au  Fact Sheet for Healthcare Providers: IncredibleEmployment.be  This test is not yet approved or cleared by the [place] FDA and has been authorized for detection and/or diagnosis of SARS-CoV-2 by FDA under an Emergency Use Authorization (EUA). This EUA will remain in effect (meaning this test can be used) for the duration of the COVID-19 declaration under Section 564(b)(1) of the Act, 21 U.S.C. section 360bbb-3(b)(1), unless the authorization is terminated or revoked.  Performed at [facility], [address] [place], [place] [address]   Blood Culture (routine x 2)     Status: None (Preliminary result)   Collection Time: 09/23/20  8:45 AM   Specimen: BLOOD  Result Value Ref Range Status   Specimen Description BLOOD RIGHT [facility]  Final   Special Requests   Final    BOTTLES DRAWN AEROBIC AND ANAEROBIC Blood Culture adequate volume   Culture   Final    NO GROWTH 4 DAYS Performed at [facility], [address] [place], [place] [address]    Report Status PENDING  Incomplete  Blood Culture (routine x 2)     Status: None (Preliminary result)   Collection Time: 09/23/20  8:45 AM   Specimen: BLOOD  Result Value Ref Range Status   Specimen Description  BLOOD RIGHT FA  Final   Special Requests   Final    BOTTLES DRAWN AEROBIC AND ANAEROBIC Blood Culture adequate volume   Culture   Final    NO GROWTH 4 DAYS Performed at [facility], [address] [place], [place] [address]    Report Status PENDING  Incomplete  MRSA Next Gen by PCR, Nasal     Status: None   Collection Time: 09/27/20  4:30 AM   Specimen: Nasal Mucosa; Nasal Swab  Result Value Ref Range Status   MRSA by PCR Next Gen NOT DETECTED NOT DETECTED Final    Comment: (NOTE) The GeneXpert  MRSA Assay (FDA approved for NASAL specimens only), is one component of a comprehensive MRSA colonization surveillance program. It is not intended to diagnose MRSA infection nor to guide or monitor treatment for MRSA infections. Test performance is not FDA approved in patients less than 36 years old. Performed at [facility], [address] [place], [place] [address]       Imaging Studies   No results found.   Medications   Scheduled Meds:  ALPRAZolam  0.5 mg Oral QHS   amiodarone  200 mg Oral Daily   atorvastatin  20 mg Oral Daily   Buprenorphine HCl  150 mcg Buccal BID   And   Buprenorphine HCl  300 mcg Buccal BID   calcium acetate  1,334 mg Oral TID WC   cefdinir  300 mg Oral Daily   Chlorhexidine Gluconate Cloth  6 each Topical Q0600   cinacalcet  60 mg Oral Q breakfast   clopidogrel  75 mg Oral Daily   cycloSPORINE  1 drop Both Eyes BID   docusate sodium  100 mg Oral Daily   doxycycline  100 mg Oral Q12H   DULoxetine  30 mg Oral Daily   fluticasone  2 spray Each Nare Daily   heparin  5,000 Units Subcutaneous Q8H   ipratropium  2 spray Each Nare TID   lactase  3,000 Units Oral TID WC   lidocaine  1 patch Transdermal Q12H   loratadine  10 mg Oral Daily   melatonin  5 mg Oral QHS   midodrine  15 mg Oral TID WC   mirtazapine  15 mg Oral QHS   multivitamin  1 tablet Oral QHS   pantoprazole  40 mg Oral Daily   pregabalin  50 mg Oral Daily   senna-docusate  1 tablet Oral BID   sodium chloride flush  3 mL Intravenous Q12H   sodium chloride flush  3 mL Intravenous Q12H   traZODone  100 mg Oral QHS   Vitamin D (Ergocalciferol)  50,000 Units Oral Weekly   Continuous Infusions:  sodium chloride         LOS: 4 days    Time spent: 30 minutes with > 50% spent at bedside and in coordination of care     Ezekiel Slocumb, DO [facility]  09/27/2020, 1:09 PM      If 7PM-7AM, please contact night-coverage. How to contact the [facility] Attending  or Consulting provider [place] or covering provider during after hours , for this patient?    Check the care team in [facility] and look for a) attending/consulting TR[facility] provider listed and b) the [facility] team listed Log into [email or web address] and use [place]'s universal password to access. If you do not have the password, please contact the hospital operator. Locate the [facility] provider you are looking for under [facility] and page to a number that you can be directly reached. If you still have difficulty reaching the provider, please page the [facility] (Director on Call) for the Hospitalists listed on amion for assistance.

## 2020-09-27 NOTE — Progress Notes (Signed)
[facility]  PROGRESS NOTE   Subjective:   Patient seen at bedside. Complaints of left lower extremity pain and discoloration. Prior notes appreciated. Patient is being followed by vascular. Had stable dialysis yesterday.  Objective:  Vital signs in last 24 hours:  Temp:  [98.2 F (36.8 C)-100.2 F (37.9 C)] 98.2 F (36.8 C) (09/24 1210) Pulse Rate:  [80-86] 80 (09/24 1210) Resp:  [16-18] 18 (09/24 1210) BP: (88-118)/(61-84) 105/69 (09/24 1210) SpO2:  [93 %-94 %] 94 % (09/24 1210)  Weight change:  Filed Weights   09/23/20 0834 09/24/20 1952  Weight: 90 kg 90.4 kg    Intake/Output: I/O last 3 completed shifts: In: 75 [P.O.:720] Out: 1000 [Other:1000]   Intake/Output this shift:  Total I/O In: 240 [P.O.:240] Out: -   Physical Exam: General:  No acute distress  Head:  Normocephalic, atraumatic. Moist oral mucosal membranes  Eyes:  Anicteric  Neck:  Supple  Lungs:   Clear to auscultation, normal effort  Heart:  S1S2 no rubs  Abdomen:   Soft, nontender, bowel sounds present  Extremities: Left lower extremity cool to touch and bluish discoloration.  Neurologic:  Awake, alert, following commands  Skin: Left lower extremity discoloration.  Access:     Basic Metabolic Panel: Recent Labs  Lab 09/23/20 0845 09/24/20 0436 09/25/20 0606 09/26/20 0552 09/27/20 0708  NA 135 133* 131* 131* 134*  K 4.2 4.3 3.9 4.1 3.9  CL 92* 92* 88* 90* 91*  CO2 28 28 26 26 29   GLUCOSE 96 92 77 79 85  BUN 31* 45* 35* 48* 36*  CREATININE 4.92* 6.22* 4.88* 6.06* 5.00*  CALCIUM 9.1 8.8* 9.1 8.5* 8.8*  MG  --   --  1.6* 2.0 1.9  PHOS  --   --   --  5.2*  --     CBC: Recent Labs  Lab 09/23/20 0845 09/24/20 0436 09/25/20 0606 09/26/20 0552 09/27/20 0708  WBC 17.9* 16.9* 11.4* 9.2 8.4  NEUTROABS 13.2*  --   --   --   --   HGB 12.8* 11.4* 11.1* 10.4* 10.8*  HCT 39.1 33.7* 33.4* 30.9* 32.6*  MCV 99.5 99.7 99.7 99.0 100.0  PLT 298 247 225 230 233      Urinalysis: No results for input(s): COLORURINE, LABSPEC, PHURINE, GLUCOSEU, HGBUR, BILIRUBINUR, KETONESUR, PROTEINUR, UROBILINOGEN, NITRITE, LEUKOCYTESUR in the last 72 hours.  Invalid input(s): APPERANCEUR    Imaging: No results found.   Medications:    sodium chloride      ALPRAZolam  0.5 mg Oral QHS   amiodarone  200 mg Oral Daily   atorvastatin  20 mg Oral Daily   Buprenorphine HCl  150 mcg Buccal BID   And   Buprenorphine HCl  300 mcg Buccal BID   calcium acetate  1,334 mg Oral TID WC   cefdinir  300 mg Oral Daily   Chlorhexidine Gluconate Cloth  6 each Topical Q0600   cinacalcet  60 mg Oral Q breakfast   clopidogrel  75 mg Oral Daily   cycloSPORINE  1 drop Both Eyes BID   docusate sodium  100 mg Oral Daily   doxycycline  100 mg Oral Q12H   DULoxetine  30 mg Oral Daily   fluticasone  2 spray Each Nare Daily   heparin  5,000 Units Subcutaneous Q8H   ipratropium  2 spray Each Nare TID   lactase  3,000 Units Oral TID WC   lidocaine  1 patch Transdermal Q12H   loratadine  10 mg Oral Daily   melatonin  5 mg Oral QHS   midodrine  15 mg Oral TID WC   mirtazapine  15 mg Oral QHS   multivitamin  1 tablet Oral QHS   pantoprazole  40 mg Oral Daily   pregabalin  50 mg Oral Daily   senna-docusate  1 tablet Oral BID   sodium chloride flush  3 mL Intravenous Q12H   sodium chloride flush  3 mL Intravenous Q12H   traZODone  100 mg Oral QHS   Vitamin D (Ergocalciferol)  50,000 Units Oral Weekly    Assessment/ Plan:     Principal Problem:   Sepsis ([place]) Active Problems:   ESRD on dialysis ([place])   Ischemic cardiomyopathy   PAF (paroxysmal atrial fibrillation) ([place])   Cellulitis   PAD (peripheral artery disease) ([place])  65 year old male with history of hypertension coronary artery disease, peripheral vascular disease, atrial fibrillation, end-stage renal disease on dialysis now admitted with history of fever and shaking chills found to have left lower extremity  cellulitis.  #1: ESRD: Patient had stable dialysis yesterday.  #2: Anemia: We will continue to monitor hemoglobin levels.  Continue the anemia protocols.  #3: Second hyperparathyroidism: We will continue to monitor calcium, phosphate and PTH levels.  We will continue the Sensipar and calcium acetate.  #4: Left lower extremity cellulitis: Patient has been on antibiotics.  He is being followed by vascular for possible intervention.   LOS: [place], MD [facility] 9/24/20221:55 PM

## 2020-09-28 DIAGNOSIS — A419 Sepsis, unspecified organism: Secondary | ICD-10-CM | POA: Diagnosis not present

## 2020-09-28 DIAGNOSIS — L03116 Cellulitis of left lower limb: Secondary | ICD-10-CM | POA: Diagnosis not present

## 2020-09-28 DIAGNOSIS — N186 End stage renal disease: Secondary | ICD-10-CM | POA: Diagnosis not present

## 2020-09-28 DIAGNOSIS — I739 Peripheral vascular disease, unspecified: Secondary | ICD-10-CM | POA: Diagnosis not present

## 2020-09-28 LAB — CBC
HCT: 31.9 % — ABNORMAL LOW (ref 39.0–52.0)
Hemoglobin: 10.5 g/dL — ABNORMAL LOW (ref 13.0–17.0)
MCH: 33.1 pg (ref 26.0–34.0)
MCHC: 32.9 g/dL (ref 30.0–36.0)
MCV: 100.6 fL — ABNORMAL HIGH (ref 80.0–100.0)
Platelets: 243 10*3/uL (ref 150–400)
RBC: 3.17 MIL/uL — ABNORMAL LOW (ref 4.22–5.81)
RDW: 15.7 % — ABNORMAL HIGH (ref 11.5–15.5)
WBC: 9.3 10*3/uL (ref 4.0–10.5)
nRBC: 0 % (ref 0.0–0.2)

## 2020-09-28 LAB — CULTURE, BLOOD (ROUTINE X 2)
Culture: NO GROWTH
Culture: NO GROWTH
Special Requests: ADEQUATE
Special Requests: ADEQUATE

## 2020-09-28 MED ORDER — BISACODYL 10 MG RE SUPP
10.0000 mg | Freq: Once | RECTAL | Status: AC
  Administered 2020-09-28: 10 mg via RECTAL
  Filled 2020-09-28: qty 1

## 2020-09-28 MED ORDER — CEFDINIR 300 MG PO CAPS: 300.0000 mg | ORAL_CAPSULE | Freq: Every day | ORAL | 0 refills | Status: AC

## 2020-09-28 MED ORDER — CEFDINIR 300 MG PO CAPS
300.0000 mg | ORAL_CAPSULE | ORAL | Status: DC
  Filled 2020-09-28: qty 1

## 2020-09-28 MED ORDER — DOXYCYCLINE HYCLATE 100 MG PO TABS: 100.0000 mg | ORAL_TABLET | Freq: Two times a day (BID) | ORAL | 0 refills | Status: AC

## 2020-09-28 NOTE — Discharge Summary (Signed)
Physician Discharge Summary  William Carpenter YJE:[id number] DOB: 1955/10/27 DOA: 09/23/2020  PCP: Raelyn Mora, MD  Admit date: 09/23/2020 Discharge date: 09/28/2020  Admitted From: home Disposition:  home  Recommendations for Outpatient Follow-up:  Follow up with PCP in 1-2 weeks Please obtain BMP/CBC in one week Please follow up with Vascular Surgery at [place]: PT, RN  Equipment/Devices: none   Discharge Condition: stable  CODE STATUS: DNR  Diet recommendation: Renal diet with fluid restriction 1200 mL/day  Discharge Diagnoses: Principal Problem:   Sepsis ([place]) Active Problems:   ESRD on dialysis ([place])   Ischemic cardiomyopathy   PAF (paroxysmal atrial fibrillation) ([place])   Cellulitis   PAD (peripheral artery disease) ([place])    Summary of HPI and Hospital Course:  65 year old male with past medical history of ESRD on dialysis MWF, paroxysmal A. fib, hyperlipidemia, CAD, PAD who follows at the door [facility], presenting to the ER via EMS due to fever and shaking changes.  Patient was recently admitted at the [facility] for 3 weeks with sepsis secondary to left lower extremity cellulitis and discharged on doxycycline as prophylaxis for recurrence.  T-max at home 101F.  Patient noted progressive redness and swelling of extremity.   Admitted to [facility] service and started on IV antibiotics with vascular surgery consulted.  Patient and family prefer any surgical intervention to be performed at the [place].  Therefore patient was treated conservatively here with IV antibiotics.     Sepsis due to left lower extremity cellulitis, recurrent in the setting of severe peripheral artery disease -patient met sepsis criteria with fever, tachycardia, leukocytosis in the setting of cellulitis.  Left lower extremity Doppler negative for DVT.   Vascular surgery consulted, intervention here declined with plan to follow up at [facility] with broad spectrum IV antiobitics:  Vanc, Cefepime, Flagyl. Transitioned PO antibiotics: Omnicef, Doxycycline Monitored for stability on oral antibiotics - stable. Elevate left lower extremity. Blood cultures negative 9/24: Worsening left lower extremity pain but no fevers or chills.  Repeat left lower extremity Doppler ultrasound again negative DVT. Discharge plan: 4 more days Omnicef and Doxy to complete 10 days of therapy.  Advise very close follow up at [facility] with vascular surgery.     Hypomagnesemia - resolved with replacement for Mg 1.6. --Monitor in follow up and and replace as needed --Goal K>4, Mg>2   History of paroxysmal A. Fib -currently rate controlled.  Continue amiodarone.  Patient not on long-term anticoagulation due to increased risk of bleeding. --Monitored on telemetry  ESRD -on hemodialysis Monday Wednesday Friday.  Nephrology following.  Monitor.   Constipation -chronic.  Last BM was Sunday 9/18 per daughter.  Patient has history of complications and obstruction.  Aggressive bowel regimen ordered and patient eventually had large BM.   Recommend scheduled stool softeners/laxatives at home.   Anxiety/depression -continue home Xanax, Remeron, Cymbalta and trazodone   Discharge Instructions   Discharge Instructions     Call MD for:  extreme fatigue   Complete by: As directed    Call MD for:  persistant dizziness or light-headedness   Complete by: As directed    Call MD for:  persistant nausea and vomiting   Complete by: As directed    Call MD for:  severe uncontrolled pain   Complete by: As directed    Call MD for:  temperature >100.4   Complete by: As directed    Diet - low sodium heart healthy   Complete by:  As directed    Discharge instructions   Complete by: As directed    Please continue taking the two antibiotics (doxycycline and cefdinir) for 4 more days.  Contact your vascular surgery team at the [facility] for continued care. I recommend following up with them in a week or less if  they are able to see you.   Increase activity slowly   Complete by: As directed       Allergies as of 09/28/2020       Reactions   Albumin (human) Hives   Erythromycin Hives   Iodinated Diagnostic Agents Hives   Iron Dextran    Metoprolol Tartrate    Penicillins Swelling   Quinine Sulfate [quinine]    Rabeprazole    Sulfa Antibiotics Other (See Comments)   Tape Other (See Comments)   Removes skin.   Trimethoprim    Aloe         Medication List     STOP taking these medications    cetirizine 10 MG tablet Commonly known as: ZYRTEC   dorzolamidel-timolol 22.3-6.8 MG/ML Soln ophthalmic solution Commonly known as: COSOPT   doxycycline 100 MG capsule Commonly known as: MONODOX   hydrALAZINE 10 MG tablet Commonly known as: APRESOLINE   lactulose 10 GM/15ML solution Commonly known as: CHRONULAC   latanoprost 0.005 % ophthalmic solution Commonly known as: XALATAN   mupirocin cream 2 % Commonly known as: BACTROBAN   sertraline 50 MG tablet Commonly known as: ZOLOFT   ticagrelor 90 MG Tabs tablet Commonly known as: BRILINTA       TAKE these medications    acetaminophen 325 MG tablet Commonly known as: TYLENOL Take 650 mg by mouth every 8 (eight) hours as needed (pain or fever).   ALPRAZolam 0.5 MG tablet Commonly known as: XANAX Take 0.5 mg by mouth 2 (two) times daily as needed for anxiety.   amiodarone 200 MG tablet Commonly known as: Pacerone Take 1 tablet (200 mg total) by mouth daily.   atorvastatin 40 MG tablet Commonly known as: LIPITOR Take 20 mg by mouth daily.   b complex-vitamin c-folic acid 0.8 MG Tabs tablet Take 1 tablet by mouth at bedtime.   Belbuca 450 MCG Film Generic drug: Buprenorphine HCl Take 450 mcg by mouth every 12 (twelve) hours.   betamethasone dipropionate 0.05 % ointment Commonly known as: DIPROLENE Apply topically 2 (two) times daily.   calcium acetate 667 MG capsule Commonly known as: PHOSLO Take 1,334 mg  by mouth 3 (three) times daily with meals. ON HOLD   carboxymethylcellulose 0.5 % Soln Commonly known as: REFRESH PLUS 1 drop every 2 (two) hours while awake.   cefdinir 300 MG capsule Commonly known as: OMNICEF Take 1 capsule (300 mg total) by mouth daily for 4 days.   cinacalcet 60 MG tablet Commonly known as: SENSIPAR Take 60 mg by mouth daily. What changed: Another medication with the same name was removed. Continue taking this medication, and follow the directions you see here.   clobetasol ointment 0.05 % Commonly known as: TEMOVATE Apply 1 application topically 2 (two) times daily.   clopidogrel 75 MG tablet Commonly known as: PLAVIX Take 75 mg by mouth daily.   cycloSPORINE 0.05 % ophthalmic emulsion Commonly known as: RESTASIS Place 1 drop into both eyes 2 (two) times daily.   docusate sodium 100 MG capsule Commonly known as: COLACE Take 100 mg by mouth daily.   doxycycline 100 MG tablet Commonly known as: VIBRA-TABS Take 1 tablet (100 mg total)  by mouth every 12 (twelve) hours for 4 days.   DULoxetine 30 MG capsule Commonly known as: CYMBALTA Take 30 mg by mouth daily.   fluticasone 50 MCG/ACT nasal spray Commonly known as: FLONASE Place 2 sprays into both nostrils daily.   guaiFENesin 600 MG 12 hr tablet Commonly known as: MUCINEX Take 600 mg by mouth 2 (two) times daily as needed for cough or to loosen phlegm.   hydrOXYzine 25 MG capsule Commonly known as: VISTARIL Take 25 mg by mouth 3 (three) times daily as needed.   ibuprofen 400 MG tablet Commonly known as: ADVIL Take 400 mg by mouth every 6 (six) hours as needed.   ipratropium 0.03 % nasal spray Commonly known as: ATROVENT Place 2 sprays into both nostrils 3 (three) times daily.   ipratropium 17 MCG/ACT inhaler Commonly known as: ATROVENT HFA Inhale 2 puffs into the lungs every 6 (six) hours as needed for wheezing.   ketoconazole 2 % shampoo Commonly known as: NIZORAL Apply 1  application topically 2 (two) times a week.   lactase 3000 units tablet Commonly known as: LACTAID Take 3,000 Units by mouth 3 (three) times daily with meals.   lidocaine 5 % Commonly known as: LIDODERM Place 1 patch onto the skin every 12 (twelve) hours. Remove & Discard patch within 12 hours or as directed by MD   lidocaine 5 % ointment Commonly known as: XYLOCAINE Apply 1 application topically 2 (two) times daily as needed for mild pain.   loratadine 10 MG tablet Commonly known as: CLARITIN Take 10 mg by mouth daily.   Melatonin 5 MG Caps Take by mouth at bedtime.   midodrine 5 MG tablet Commonly known as: PROAMATINE Take 15 mg by mouth 4 (four) times daily as needed.   mirtazapine 15 MG tablet Commonly known as: REMERON Take 15 mg by mouth at bedtime.   naloxone 4 MG/0.1ML Liqd nasal spray kit Commonly known as: NARCAN Place 1 spray into the nose.   nitroGLYCERIN 0.4 MG SL tablet Commonly known as: NITROSTAT Place 1 tablet (0.4 mg total) under the tongue every 5 (five) minutes as needed for chest pain.   omeprazole 20 MG capsule Commonly known as: PRILOSEC Take 20 mg by mouth 2 (two) times daily before a meal.   polyvinyl alcohol 1.4 % ophthalmic solution Commonly known as: LIQUIFILM TEARS Place 1 drop into both eyes 4 (four) times daily as needed for dry eyes.   pregabalin 50 MG capsule Commonly known as: LYRICA Take 50 mg by mouth daily.   senna-docusate 8.6-50 MG tablet Commonly known as: Senokot-S Take 1 tablet by mouth 2 (two) times daily as needed (to prevent constipation; scheduled until bowel movement).   simethicone 40 MG/0.6ML drops Commonly known as: MYLICON Take 40 mg by mouth 4 (four) times daily as needed for flatulence.   traZODone 100 MG tablet Commonly known as: DESYREL Take 100 mg by mouth at bedtime as needed for sleep.   Vitamin D (Ergocalciferol) 1.25 MG (50000 UNIT) Caps capsule Commonly known as: DRISDOL Take 50,000 Units by  mouth once a week.        Allergies  Allergen Reactions   Albumin (Human) Hives   Erythromycin Hives   Iodinated Diagnostic Agents Hives   Iron Dextran    Metoprolol Tartrate    Penicillins Swelling   Quinine Sulfate [Quinine]    Rabeprazole    Sulfa Antibiotics Other (See Comments)   Tape Other (See Comments)    Removes skin.  Trimethoprim    Aloe      If you experience worsening of your admission symptoms, develop shortness of breath, life threatening emergency, suicidal or homicidal thoughts you must seek medical attention immediately by calling 911 or calling your MD immediately  if symptoms less severe.    Please note   You were cared for by a hospitalist during your hospital stay. If you have any questions about your discharge medications or the care you received while you were in the hospital after you are discharged, you can call the unit and asked to speak with the hospitalist on call if the hospitalist that took care of you is not available. Once you are discharged, your primary care physician will handle any further medical issues. Please note that NO REFILLS for any discharge medications will be authorized once you are discharged, as it is imperative that you return to your primary care physician (or establish a relationship with a primary care physician if you do not have one) for your aftercare needs so that they can reassess your need for medications and monitor your lab values.   Consultations: Vascular surgery Nephrology    Procedures/Studies: US Venous Img Lower Unilateral Left (DVT)  Result Date: 09/27/2020 CLINICAL DATA:  Left lower extremity pain and edema. History of malignancy. Currently on anticoagulation. Evaluate for DVT. EXAM: LEFT LOWER EXTREMITY VENOUS DOPPLER ULTRASOUND TECHNIQUE: Gray-scale sonography with graded compression, as well as color Doppler and duplex ultrasound were performed to evaluate the lower extremity deep venous systems from  the level of the common femoral vein and including the common femoral, femoral, profunda femoral, popliteal and calf veins including the posterior tibial, peroneal and gastrocnemius veins when visible. The superficial great saphenous vein was also interrogated. Spectral Doppler was utilized to evaluate flow at rest and with distal augmentation maneuvers in the common femoral, femoral and popliteal veins. COMPARISON:  Left lower extremity venous Doppler ultrasound-09/23/2020 (negative) FINDINGS: Contralateral Common Femoral Vein: Respiratory phasicity is normal and symmetric with the symptomatic side. No evidence of thrombus. Normal compressibility. Common Femoral Vein: No evidence of thrombus. Normal compressibility, respiratory phasicity and response to augmentation. Saphenofemoral Junction: No evidence of thrombus. Normal compressibility and flow on color Doppler imaging. Profunda Femoral Vein: No evidence of thrombus. Normal compressibility and flow on color Doppler imaging. Femoral Vein: No evidence of thrombus. Normal compressibility, respiratory phasicity and response to augmentation. Popliteal Vein: No evidence of thrombus. Normal compressibility, respiratory phasicity and response to augmentation. Calf Veins: No evidence of thrombus. Normal compressibility and flow on color Doppler imaging. Superficial Great Saphenous Vein: No evidence of thrombus. Normal compressibility. Venous Reflux:  None. Other Findings:  None. IMPRESSION: No evidence of DVT within the left lower extremity. Electronically Signed   By: Sandi Mariscal M.D.   On: 09/27/2020 15:24   US Venous Img Lower Unilateral Left (DVT)  Result Date: 09/23/2020 CLINICAL DATA:  Left lower extremity pain and edema. History of malignancy. Evaluate for DVT. EXAM: LEFT LOWER EXTREMITY VENOUS DOPPLER ULTRASOUND TECHNIQUE: Gray-scale sonography with graded compression, as well as color Doppler and duplex ultrasound were performed to evaluate the lower  extremity deep venous systems from the level of the common femoral vein and including the common femoral, femoral, profunda femoral, popliteal and calf veins including the posterior tibial, peroneal and gastrocnemius veins when visible. The superficial great saphenous vein was also interrogated. Spectral Doppler was utilized to evaluate flow at rest and with distal augmentation maneuvers in the common femoral, femoral and popliteal veins. COMPARISON:  None. FINDINGS: Contralateral Common Femoral Vein: Respiratory phasicity is normal and symmetric with the symptomatic side. No evidence of thrombus. Normal compressibility. Common Femoral Vein: No evidence of thrombus. Normal compressibility, respiratory phasicity and response to augmentation. Saphenofemoral Junction: No evidence of thrombus. Normal compressibility and flow on color Doppler imaging. Profunda Femoral Vein: No evidence of thrombus. Normal compressibility and flow on color Doppler imaging. Femoral Vein: No evidence of thrombus. Normal compressibility, respiratory phasicity and response to augmentation. Popliteal Vein: No evidence of thrombus. Normal compressibility, respiratory phasicity and response to augmentation. Calf Veins: No evidence of thrombus. Normal compressibility and flow on color Doppler imaging. Superficial Great Saphenous Vein: No evidence of thrombus. Normal compressibility. Other Findings:  None. IMPRESSION: No evidence of DVT within the left lower extremity. Electronically Signed   By: Sandi Mariscal M.D.   On: 09/23/2020 13:51   DG Chest Port 1 View  Result Date: 09/23/2020 CLINICAL DATA:  Suspect sepsis.  Complains of ongoing fever. EXAM: PORTABLE CHEST 1 VIEW COMPARISON:  06/07/2019 FINDINGS: Normal heart size. Pulmonary vascular congestion identified. Areas of platelike atelectasis noted within the left mid and left lower lung. No airspace consolidation identified. IMPRESSION: Left mid and lower lung platelike atelectasis.  Electronically Signed   By: Kerby Moors M.D.   On: 09/23/2020 09:25       Subjective: Pt finally had BM earlier this AM.  Leg feels okay, still painful but not worsening.  No fever/chills.  Eager to go home.     Discharge Exam: Vitals:   09/28/20 0715 09/28/20 1100  BP: 110/72 122/88  Pulse: 92 88  Resp:    Temp: 98.6 F (37 C) 98.8 F (37.1 C)  SpO2: 97% 98%   Vitals:   09/27/20 2348 09/28/20 0442 09/28/20 0715 09/28/20 1100  BP: 110/72 109/78 110/72 122/88  Pulse: 86 81 92 88  Resp: (!) 21 17    Temp: 98.6 F (37 C) 98.3 F (36.8 C) 98.6 F (37 C) 98.8 F (37.1 C)  TempSrc: Oral     SpO2: 93% 99% 97% 98%  Weight:  88.8 kg    Height:        General: Pt is alert, awake, not in acute distress Cardiovascular: RRR, S1/S2 +, no rubs, no gallops Respiratory: CTA bilaterally, no wheezing, no rhonchi Abdominal: Soft, NT, ND, bowel sounds + Extremities: LLE with stable hyperpigmentation and  erythema and swelling with mild tenderness on palpation. Unpalpable DP pulses bilaterally    The results of significant diagnostics from this hospitalization (including imaging, microbiology, ancillary and laboratory) are listed below for reference.     Microbiology: Recent Results (from the past 240 hour(s))  Resp Panel by RT-PCR (Flu A&B, Covid) Nasopharyngeal Swab     Status: None   Collection Time: 09/23/20  8:45 AM   Specimen: Nasopharyngeal Swab; Nasopharyngeal(NP) swabs in vial transport medium  Result Value Ref Range Status   SARS Coronavirus 2 by RT PCR NEGATIVE NEGATIVE Final    Comment: (NOTE) SARS-CoV-2 target nucleic acids are NOT DETECTED.  The SARS-CoV-2 RNA is generally detectable in upper respiratory specimens during the acute phase of infection. The lowest concentration of SARS-CoV-2 viral copies this assay can detect is 138 copies/mL. A negative result does not preclude SARS-Cov-2 infection and should not be used as the sole basis for treatment or other  patient management decisions. A negative result may occur with  improper specimen collection/handling, submission of specimen other than nasopharyngeal swab, presence of viral mutation(s) within the areas targeted  by this assay, and inadequate number of viral copies(<138 copies/mL). A negative result must be combined with clinical observations, patient history, and epidemiological information. The expected result is Negative.  Fact Sheet for Patients:  EntrepreneurPulse.com.au  Fact Sheet for Healthcare Providers:  IncredibleEmployment.be  This test is no t yet approved or cleared by the [place] FDA and  has been authorized for detection and/or diagnosis of SARS-CoV-2 by FDA under an Emergency Use Authorization (EUA). This EUA will remain  in effect (meaning this test can be used) for the duration of the COVID-19 declaration under Section 564(b)(1) of the Act, 21 U.S.C.section 360bbb-3(b)(1), unless the authorization is terminated  or revoked sooner.       Influenza A by PCR NEGATIVE NEGATIVE Final   Influenza B by PCR NEGATIVE NEGATIVE Final    Comment: (NOTE) The Xpert Xpress SARS-CoV-2/FLU/RSV plus assay is intended as an aid in the diagnosis of influenza from Nasopharyngeal swab specimens and should not be used as a sole basis for treatment. Nasal washings and aspirates are unacceptable for Xpert Xpress SARS-CoV-2/FLU/RSV testing.  Fact Sheet for Patients: EntrepreneurPulse.com.au  Fact Sheet for Healthcare Providers: IncredibleEmployment.be  This test is not yet approved or cleared by the [place] FDA and has been authorized for detection and/or diagnosis of SARS-CoV-2 by FDA under an Emergency Use Authorization (EUA). This EUA will remain in effect (meaning this test can be used) for the duration of the COVID-19 declaration under Section 564(b)(1) of the Act, 21 U.S.C. section  360bbb-3(b)(1), unless the authorization is terminated or revoked.  Performed at [facility], [address] [place], [place] [address]   Blood Culture (routine x 2)     Status: None   Collection Time: 09/23/20  8:45 AM   Specimen: BLOOD  Result Value Ref Range Status   Specimen Description BLOOD RIGHT [facility]  Final   Special Requests   Final    BOTTLES DRAWN AEROBIC AND ANAEROBIC Blood Culture adequate volume   Culture   Final    NO GROWTH 5 DAYS Performed at [facility], [address] [place], [place] [address]    Report Status 09/28/2020 FINAL  Final  Blood Culture (routine x 2)     Status: None   Collection Time: 09/23/20  8:45 AM   Specimen: BLOOD  Result Value Ref Range Status   Specimen Description BLOOD RIGHT FA  Final   Special Requests   Final    BOTTLES DRAWN AEROBIC AND ANAEROBIC Blood Culture adequate volume   Culture   Final    NO GROWTH 5 DAYS Performed at [facility], [address] [place], [place] [address]    Report Status 09/28/2020 FINAL  Final  MRSA Next Gen by PCR, Nasal     Status: None   Collection Time: 09/27/20  4:30 AM   Specimen: Nasal Mucosa; Nasal Swab  Result Value Ref Range Status   MRSA by PCR Next Gen NOT DETECTED NOT DETECTED Final    Comment: (NOTE) The GeneXpert MRSA Assay (FDA approved for NASAL specimens only), is one component of a comprehensive MRSA colonization surveillance program. It is not intended to diagnose MRSA infection nor to guide or monitor treatment for MRSA infections. Test performance is not FDA approved in patients less than 36 years old. Performed at [facility], [address] [place], [place] [address]      Labs: BNP (last 3 results) No results for input(s): BNP in the last 8760 hours. Basic Metabolic  Panel: Recent Labs  Lab 09/23/20 0845 09/24/20 0436 09/25/20 0606 09/26/20 0552 09/27/20 0708  NA 135 133* 131* 131* 134*  K 4.2 4.3 3.9 4.1 3.9  CL 92*  92* 88* 90* 91*  CO2 _0 GLUCOSE 96 92 77 79 85  BUN 31* 45* 35* 48* 36*  CREATININE 4.92* 6.22* 4.88* 6.06* 5.00*  CALCIUM 9.1 8.8* 9.1 8.5* 8.8*  MG  --   --  1.6* 2.0 1.9  PHOS  --   --   --  5.2*  --    Liver Function Tests: Recent Labs  Lab 09/23/20 0845  AST 28  ALT 16  ALKPHOS 158*  BILITOT 1.1  PROT 9.2*  ALBUMIN 3.4*   No results for input(s): LIPASE, AMYLASE in the last 168 hours. No results for input(s): AMMONIA in the last 168 hours. CBC: Recent Labs  Lab 09/23/20 0845 09/24/20 0436 09/25/20 0606 09/26/20 0552 09/27/20 0708 09/28/20 0510  WBC 17.9* 16.9* 11.4* 9.2 8.4 9.3  NEUTROABS 13.2*  --   --   --   --   --   HGB 12.8* 11.4* 11.1* 10.4* 10.8* 10.5*  HCT 39.1 33.7* 33.4* 30.9* 32.6* 31.9*  MCV 99.5 99.7 99.7 99.0 100.0 100.6*  PLT 298 247 225 230 233 243   Cardiac Enzymes: No results for input(s): CKTOTAL, CKMB, CKMBINDEX, TROPONINI in the last 168 hours. BNP: Invalid input(s): POCBNP CBG: No results for input(s): GLUCAP in the last 168 hours. D-Dimer No results for input(s): DDIMER in the last 72 hours. Hgb A1c No results for input(s): HGBA1C in the last 72 hours. Lipid Profile No results for input(s): CHOL, HDL, LDLCALC, TRIG, CHOLHDL, LDLDIRECT in the last 72 hours. Thyroid function studies Recent Labs    09/27/20 1238  TSH 2.275   Anemia work up No results for input(s): VITAMINB12, FOLATE, FERRITIN, TIBC, IRON, RETICCTPCT in the last 72 hours. Urinalysis No results found for: COLORURINE, APPEARANCEUR, [place], [place], GLUCOSEU, [place], [place], [place], PROTEINUR, UROBILINOGEN, NITRITE, LEUKOCYTESUR Sepsis Labs Invalid input(s): PROCALCITONIN,  WBC,  LACTICIDVEN Microbiology Recent Results (from the past 240 hour(s))  Resp Panel by RT-PCR (Flu A&B, Covid) Nasopharyngeal Swab     Status: None   Collection Time: 09/23/20  8:45 AM   Specimen: Nasopharyngeal Swab; Nasopharyngeal(NP) swabs in vial transport medium   Result Value Ref Range Status   SARS Coronavirus 2 by RT PCR NEGATIVE NEGATIVE Final    Comment: (NOTE) SARS-CoV-2 target nucleic acids are NOT DETECTED.  The SARS-CoV-2 RNA is generally detectable in upper respiratory specimens during the acute phase of infection. The lowest concentration of SARS-CoV-2 viral copies this assay can detect is 138 copies/mL. A negative result does not preclude SARS-Cov-2 infection and should not be used as the sole basis for treatment or other patient management decisions. A negative result may occur with  improper specimen collection/handling, submission of specimen other than nasopharyngeal swab, presence of viral mutation(s) within the areas targeted by this assay, and inadequate number of viral copies(<138 copies/mL). A negative result must be combined with clinical observations, patient history, and epidemiological information. The expected result is Negative.  Fact Sheet for Patients:  EntrepreneurPulse.com.au  Fact Sheet for Healthcare Providers:  [employer]credible[employer]ployment[employer]  This test is no t yet approved or cleared by the [place] FDA and  has been authorized for detection and/or diagnosis of SARS-CoV-2 by FDA under an Emergency Use Authorization (EUA). This EUA will remain  in effect (meaning this test can be used)  for the duration of the COVID-19 declaration under Section 564(b)(1) of the Act, 21 U.S.C.section 360bbb-3(b)(1), unless the authorization is terminated  or revoked sooner.       Influenza A by PCR NEGATIVE NEGATIVE Final   Influenza B by PCR NEGATIVE NEGATIVE Final    Comment: (NOTE) The Xpert Xpress SARS-CoV-2/FLU/RSV plus assay is intended as an aid in the diagnosis of influenza from Nasopharyngeal swab specimens and should not be used as a sole basis for treatment. Nasal washings and aspirates are unacceptable for Xpert Xpress SARS-CoV-2/FLU/RSV testing.  Fact Sheet for  Patients: EntrepreneurPulse.com.au  Fact Sheet for Healthcare Providers: IncredibleEmployment.be  This test is not yet approved or cleared by the [place] FDA and has been authorized for detection and/or diagnosis of SARS-CoV-2 by FDA under an Emergency Use Authorization (EUA). This EUA will remain in effect (meaning this test can be used) for the duration of the COVID-19 declaration under Section 564(b)(1) of the Act, 21 U.S.C. section 360bbb-3(b)(1), unless the authorization is terminated or revoked.  Performed at [facility], [address] [place], [place] [address]   Blood Culture (routine x 2)     Status: None   Collection Time: 09/23/20  8:45 AM   Specimen: BLOOD  Result Value Ref Range Status   Specimen Description BLOOD RIGHT [facility]  Final   Special Requests   Final    BOTTLES DRAWN AEROBIC AND ANAEROBIC Blood Culture adequate volume   Culture   Final    NO GROWTH 5 DAYS Performed at [facility], [address] [place], [place] [address]    Report Status 09/28/2020 FINAL  Final  Blood Culture (routine x 2)     Status: None   Collection Time: 09/23/20  8:45 AM   Specimen: BLOOD  Result Value Ref Range Status   Specimen Description BLOOD RIGHT FA  Final   Special Requests   Final    BOTTLES DRAWN AEROBIC AND ANAEROBIC Blood Culture adequate volume   Culture   Final    NO GROWTH 5 DAYS Performed at [facility], [address], [place], [place] [address]    Report Status 09/28/2020 FINAL  Final  MRSA Next Gen by PCR, Nasal     Status: None   Collection Time: 09/27/20  4:30 AM   Specimen: Nasal Mucosa; Nasal Swab  Result Value Ref Range Status   MRSA by PCR Next Gen NOT DETECTED NOT DETECTED Final    Comment: (NOTE) The GeneXpert MRSA Assay (FDA approved for NASAL specimens only), is one component of a comprehensive MRSA colonization surveillance program. It is not intended to diagnose MRSA  infection nor to guide or monitor treatment for MRSA infections. Test performance is not FDA approved in patients less than 65 years old. Performed at [facility], [address] [place], [place] [address]      Time coordinating discharge: Over 30 minutes  SIGNED:   Ezekiel Slocumb, DO [facility] 09/28/2020, 2:32 PM   If 7PM-7AM, please contact night-coverage [email or web address]

## 2020-09-28 NOTE — Progress Notes (Signed)
[facility]  PROGRESS NOTE   Subjective:   As per the patient he is going home today and follow-up with the [facility] physicians.  Objective:  Vital signs in last 24 hours:  Temp:  [98.3 F (36.8 C)-99.1 F (37.3 C)] 98.8 F (37.1 C) (09/25 1100) Pulse Rate:  [81-92] 88 (09/25 1100) Resp:  [17-21] 17 (09/25 0442) BP: (109-122)/(72-88) 122/88 (09/25 1100) SpO2:  [93 %-99 %] 98 % (09/25 1100) Weight:  [88.8 kg] 88.8 kg (09/25 0442)  Weight change:  Filed Weights   09/23/20 0834 09/24/20 1952 09/28/20 0442  Weight: 90 kg 90.4 kg 88.8 kg    Intake/Output: I/O last 3 completed shifts: In: 59 [P.O.:720] Out: 250 [Urine:250]   Intake/Output this shift:  Total I/O In: -  Out: 1 [Stool:1]  Physical Exam: General:  No acute distress  Head:  Normocephalic, atraumatic. Moist oral mucosal membranes  Eyes:  Anicteric  Neck:  Supple  Lungs:   Clear to auscultation, normal effort  Heart:  S1S2 no rubs  Abdomen:   Soft, nontender, bowel sounds present  Extremities: Lower extremity discoloration  Neurologic:  Awake, alert, following commands  Skin:  No lesions  Access:     Basic Metabolic Panel: Recent Labs  Lab 09/23/20 0845 09/24/20 0436 09/25/20 0606 09/26/20 0552 09/27/20 0708  NA 135 133* 131* 131* 134*  K 4.2 4.3 3.9 4.1 3.9  CL 92* 92* 88* 90* 91*  CO2 28 28 26 26 29   GLUCOSE 96 92 77 79 85  BUN 31* 45* 35* 48* 36*  CREATININE 4.92* 6.22* 4.88* 6.06* 5.00*  CALCIUM 9.1 8.8* 9.1 8.5* 8.8*  MG  --   --  1.6* 2.0 1.9  PHOS  --   --   --  5.2*  --     CBC: Recent Labs  Lab 09/23/20 0845 09/24/20 0436 09/25/20 0606 09/26/20 0552 09/27/20 0708 09/28/20 0510  WBC 17.9* 16.9* 11.4* 9.2 8.4 9.3  NEUTROABS 13.2*  --   --   --   --   --   HGB 12.8* 11.4* 11.1* 10.4* 10.8* 10.5*  HCT 39.1 33.7* 33.4* 30.9* 32.6* 31.9*  MCV 99.5 99.7 99.7 99.0 100.0 100.6*  PLT 298 247 225 230 233 243     Urinalysis: No results for input(s): COLORURINE,  LABSPEC, PHURINE, GLUCOSEU, HGBUR, BILIRUBINUR, KETONESUR, PROTEINUR, UROBILINOGEN, NITRITE, LEUKOCYTESUR in the last 72 hours.  Invalid input(s): APPERANCEUR    Imaging: US Venous Img Lower Unilateral Left (DVT)  Result Date: 09/27/2020 CLINICAL DATA:  Left lower extremity pain and edema. History of malignancy. Currently on anticoagulation. Evaluate for DVT. EXAM: LEFT LOWER EXTREMITY VENOUS DOPPLER ULTRASOUND TECHNIQUE: Gray-scale sonography with graded compression, as well as color Doppler and duplex ultrasound were performed to evaluate the lower extremity deep venous systems from the level of the common femoral vein and including the common femoral, femoral, profunda femoral, popliteal and calf veins including the posterior tibial, peroneal and gastrocnemius veins when visible. The superficial great saphenous vein was also interrogated. Spectral Doppler was utilized to evaluate flow at rest and with distal augmentation maneuvers in the common femoral, femoral and popliteal veins. COMPARISON:  Left lower extremity venous Doppler ultrasound-09/23/2020 (negative) FINDINGS: Contralateral Common Femoral Vein: Respiratory phasicity is normal and symmetric with the symptomatic side. No evidence of thrombus. Normal compressibility. Common Femoral Vein: No evidence of thrombus. Normal compressibility, respiratory phasicity and response to augmentation. Saphenofemoral Junction: No evidence of thrombus. Normal compressibility and flow on color Doppler imaging.  Profunda Femoral Vein: No evidence of thrombus. Normal compressibility and flow on color Doppler imaging. Femoral Vein: No evidence of thrombus. Normal compressibility, respiratory phasicity and response to augmentation. Popliteal Vein: No evidence of thrombus. Normal compressibility, respiratory phasicity and response to augmentation. Calf Veins: No evidence of thrombus. Normal compressibility and flow on color Doppler imaging. Superficial Great Saphenous  Vein: No evidence of thrombus. Normal compressibility. Venous Reflux:  None. Other Findings:  None. IMPRESSION: No evidence of DVT within the left lower extremity. Electronically Signed   By: Sandi Mariscal M.D.   On: 09/27/2020 15:24     Medications:    sodium chloride      ALPRAZolam  0.5 mg Oral QHS   amiodarone  200 mg Oral Daily   atorvastatin  20 mg Oral Daily   Buprenorphine HCl  150 mcg Buccal BID   And   Buprenorphine HCl  300 mcg Buccal BID   calcium acetate  1,334 mg Oral TID WC   cefdinir  300 mg Oral Once per day on Sun Mon Wed Fri   Chlorhexidine Gluconate Cloth  6 each Topical Q0600   cinacalcet  60 mg Oral Q breakfast   clopidogrel  75 mg Oral Daily   cycloSPORINE  1 drop Both Eyes BID   docusate sodium  100 mg Oral Daily   doxycycline  100 mg Oral Q12H   DULoxetine  30 mg Oral Daily   fluticasone  2 spray Each Nare Daily   heparin  5,000 Units Subcutaneous Q8H   ipratropium  2 spray Each Nare TID   lactase  3,000 Units Oral TID WC   lidocaine  1 patch Transdermal Q12H   loratadine  10 mg Oral Daily   melatonin  5 mg Oral QHS   midodrine  15 mg Oral TID WC   mirtazapine  15 mg Oral QHS   multivitamin  1 tablet Oral QHS   pantoprazole  40 mg Oral Daily   pregabalin  50 mg Oral Daily   senna-docusate  1 tablet Oral BID   sodium chloride flush  3 mL Intravenous Q12H   sodium chloride flush  3 mL Intravenous Q12H   traZODone  100 mg Oral QHS   Vitamin D (Ergocalciferol)  50,000 Units Oral Weekly    Assessment/ Plan:     Principal Problem:   Sepsis ([place]) Active Problems:   ESRD on dialysis ([place])   Ischemic cardiomyopathy   PAF (paroxysmal atrial fibrillation) ([place])   Cellulitis   PAD (peripheral artery disease) ([place])  65 year old male with history of hypertension coronary artery disease, peripheral vascular disease, atrial fibrillation, end-stage renal disease on dialysis now admitted with history of fever and shaking chills found to have left lower  extremity cellulitis.   #1: ESRD: Patient had stable dialysis on Friday.  Patient would like to go home and resume dialysis as outpatient.   #2: Anemia: We will continue to monitor hemoglobin levels.  Continue the anemia protocols.   #3: Second hyperparathyroidism: We will continue to monitor calcium, phosphate and PTH levels.  We will continue the Sensipar and calcium acetate.   #4: Left lower extremity cellulitis: Patient has been on antibiotics.  He is being followed by vascular for possible intervention either here or at the [place]..   LOS: [place], [facility] 9/25/20223:12 PM

## 2020-09-28 NOTE — Progress Notes (Signed)
PHARMACY NOTE:  ANTIMICROBIAL RENAL DOSAGE ADJUSTMENT  Current antimicrobial regimen includes a mismatch between antimicrobial dosage and estimated renal function.  As per policy approved by the Pharmacy & Therapeutics and Medical Executive Committees, the antimicrobial dosage will be adjusted accordingly.  Current antimicrobial dosage:  Cefdinir 300 mg daily  Indication: Cellulitis  Renal Function:  Estimated Creatinine Clearance: 16.2 mL/min (A) (by C-G formula based on SCr of 5 mg/dL (H)). [x]      On intermittent HD, scheduled: MWF    Antimicrobial dosage has been changed to:  Cefdinir MoWeFr post-HD and supplemental dose on Su   Thank you for allowing pharmacy to be a part of this patient's care.  Benita Gutter, [facility] 09/28/2020 7:50 AM

## 2020-09-28 NOTE — TOC Transition Note (Signed)
Transition of Care [facility]) - CM/SW Discharge Note   Patient Details  Name: William Carpenter MRN: [id number] Date of Birth: 1955-03-01  Transition of Care [facility]) CM/SW Contact:  Magnus Ivan, LCSW Phone Number: 09/28/2020, 2:09 PM   Clinical Narrative:   Patient to discharge home today. CSW notified Corene Cornea with [facility]. They will follow for PT and RN.    Final next level of care: [place] Barriers to Discharge: Barriers Resolved   Patient Goals and CMS Choice Patient states their goals for this hospitalization and ongoing recovery are:: to go home CMS Medicare.gov Compare Post Acute Care list provided to:: Patient Represenative (must comment) (daughter Juliann Pulse) Choice offered to / list presented to : Adult Children  Discharge Placement                       Discharge Plan and Services     Post Acute Care Choice: Home Health                    HH Arranged: PT, RN [facility] Agency: [place] ([facility]) Date [place]: 09/28/20 Time [place]: 1506 Representative spoke with at [place]: [place] (SDOH) Interventions     Readmission Risk Interventions Readmission Risk Prevention Plan 11/05/2018  Transportation Screening Complete  HRI or Home Care Consult Complete  Social Work Consult for [place] Planning/Counseling Complete  Palliative Care Screening Not Applicable  Medication Review Press photographer) Complete  Some recent data might be hidden

## 2020-12-05 DIAGNOSIS — T8612 Kidney transplant failure: Secondary | ICD-10-CM | POA: Diagnosis not present

## 2020-12-05 DIAGNOSIS — Z905 Acquired absence of kidney: Secondary | ICD-10-CM | POA: Diagnosis not present

## 2020-12-05 DIAGNOSIS — N250 Renal osteodystrophy: Secondary | ICD-10-CM | POA: Diagnosis not present

## 2020-12-05 DIAGNOSIS — F419 Anxiety disorder, unspecified: Secondary | ICD-10-CM | POA: Diagnosis not present

## 2020-12-05 DIAGNOSIS — G8929 Other chronic pain: Secondary | ICD-10-CM | POA: Diagnosis not present

## 2020-12-05 DIAGNOSIS — E785 Hyperlipidemia, unspecified: Secondary | ICD-10-CM | POA: Diagnosis not present

## 2020-12-05 DIAGNOSIS — Z85528 Personal history of other malignant neoplasm of kidney: Secondary | ICD-10-CM | POA: Diagnosis not present

## 2020-12-05 DIAGNOSIS — I2510 Atherosclerotic heart disease of native coronary artery without angina pectoris: Secondary | ICD-10-CM | POA: Diagnosis not present

## 2020-12-05 DIAGNOSIS — Z9289 Personal history of other medical treatment: Secondary | ICD-10-CM | POA: Diagnosis not present

## 2020-12-05 DIAGNOSIS — Z9081 Acquired absence of spleen: Secondary | ICD-10-CM | POA: Diagnosis not present

## 2020-12-05 DIAGNOSIS — I252 Old myocardial infarction: Secondary | ICD-10-CM | POA: Diagnosis not present

## 2020-12-05 DIAGNOSIS — D472 Monoclonal gammopathy: Secondary | ICD-10-CM | POA: Diagnosis not present

## 2020-12-05 DIAGNOSIS — I120 Hypertensive chronic kidney disease with stage 5 chronic kidney disease or end stage renal disease: Secondary | ICD-10-CM | POA: Diagnosis not present

## 2020-12-05 DIAGNOSIS — Z8619 Personal history of other infectious and parasitic diseases: Secondary | ICD-10-CM | POA: Diagnosis not present

## 2020-12-05 DIAGNOSIS — G629 Polyneuropathy, unspecified: Secondary | ICD-10-CM | POA: Diagnosis not present

## 2020-12-05 DIAGNOSIS — N186 End stage renal disease: Secondary | ICD-10-CM | POA: Diagnosis not present

## 2020-12-05 DIAGNOSIS — G4733 Obstructive sleep apnea (adult) (pediatric): Secondary | ICD-10-CM | POA: Diagnosis not present

## 2020-12-05 DIAGNOSIS — F32A Depression, unspecified: Secondary | ICD-10-CM | POA: Diagnosis not present

## 2020-12-05 DIAGNOSIS — G4700 Insomnia, unspecified: Secondary | ICD-10-CM | POA: Diagnosis not present

## 2020-12-05 DIAGNOSIS — Z89612 Acquired absence of left leg above knee: Secondary | ICD-10-CM | POA: Diagnosis not present

## 2020-12-05 DIAGNOSIS — Z872 Personal history of diseases of the skin and subcutaneous tissue: Secondary | ICD-10-CM | POA: Diagnosis not present

## 2020-12-05 DIAGNOSIS — I480 Paroxysmal atrial fibrillation: Secondary | ICD-10-CM | POA: Diagnosis not present

## 2020-12-05 DIAGNOSIS — N2581 Secondary hyperparathyroidism of renal origin: Secondary | ICD-10-CM | POA: Diagnosis not present

## 2020-12-05 DIAGNOSIS — R34 Anuria and oliguria: Secondary | ICD-10-CM | POA: Diagnosis not present

## 2020-12-05 DIAGNOSIS — Z9989 Dependence on other enabling machines and devices: Secondary | ICD-10-CM | POA: Diagnosis not present

## 2020-12-06 DIAGNOSIS — G4733 Obstructive sleep apnea (adult) (pediatric): Secondary | ICD-10-CM | POA: Diagnosis not present

## 2020-12-06 DIAGNOSIS — R34 Anuria and oliguria: Secondary | ICD-10-CM | POA: Diagnosis not present

## 2020-12-06 DIAGNOSIS — D472 Monoclonal gammopathy: Secondary | ICD-10-CM | POA: Diagnosis not present

## 2020-12-06 DIAGNOSIS — N186 End stage renal disease: Secondary | ICD-10-CM | POA: Diagnosis not present

## 2020-12-06 DIAGNOSIS — I120 Hypertensive chronic kidney disease with stage 5 chronic kidney disease or end stage renal disease: Secondary | ICD-10-CM | POA: Diagnosis not present

## 2020-12-06 DIAGNOSIS — T8612 Kidney transplant failure: Secondary | ICD-10-CM | POA: Diagnosis not present

## 2020-12-07 DIAGNOSIS — R34 Anuria and oliguria: Secondary | ICD-10-CM | POA: Diagnosis not present

## 2020-12-07 DIAGNOSIS — G4733 Obstructive sleep apnea (adult) (pediatric): Secondary | ICD-10-CM | POA: Diagnosis not present

## 2020-12-07 DIAGNOSIS — T8612 Kidney transplant failure: Secondary | ICD-10-CM | POA: Diagnosis not present

## 2020-12-07 DIAGNOSIS — I120 Hypertensive chronic kidney disease with stage 5 chronic kidney disease or end stage renal disease: Secondary | ICD-10-CM | POA: Diagnosis not present

## 2020-12-07 DIAGNOSIS — N186 End stage renal disease: Secondary | ICD-10-CM | POA: Diagnosis not present

## 2020-12-07 DIAGNOSIS — D472 Monoclonal gammopathy: Secondary | ICD-10-CM | POA: Diagnosis not present

## 2020-12-08 DIAGNOSIS — I120 Hypertensive chronic kidney disease with stage 5 chronic kidney disease or end stage renal disease: Secondary | ICD-10-CM | POA: Diagnosis not present

## 2020-12-08 DIAGNOSIS — D472 Monoclonal gammopathy: Secondary | ICD-10-CM | POA: Diagnosis not present

## 2020-12-08 DIAGNOSIS — N186 End stage renal disease: Secondary | ICD-10-CM | POA: Diagnosis not present

## 2020-12-08 DIAGNOSIS — G4733 Obstructive sleep apnea (adult) (pediatric): Secondary | ICD-10-CM | POA: Diagnosis not present

## 2020-12-08 DIAGNOSIS — T8612 Kidney transplant failure: Secondary | ICD-10-CM | POA: Diagnosis not present

## 2020-12-08 DIAGNOSIS — R34 Anuria and oliguria: Secondary | ICD-10-CM | POA: Diagnosis not present

## 2020-12-09 DIAGNOSIS — I120 Hypertensive chronic kidney disease with stage 5 chronic kidney disease or end stage renal disease: Secondary | ICD-10-CM | POA: Diagnosis not present

## 2020-12-09 DIAGNOSIS — R34 Anuria and oliguria: Secondary | ICD-10-CM | POA: Diagnosis not present

## 2020-12-09 DIAGNOSIS — N186 End stage renal disease: Secondary | ICD-10-CM | POA: Diagnosis not present

## 2020-12-09 DIAGNOSIS — G4733 Obstructive sleep apnea (adult) (pediatric): Secondary | ICD-10-CM | POA: Diagnosis not present

## 2020-12-09 DIAGNOSIS — D472 Monoclonal gammopathy: Secondary | ICD-10-CM | POA: Diagnosis not present

## 2020-12-09 DIAGNOSIS — T8612 Kidney transplant failure: Secondary | ICD-10-CM | POA: Diagnosis not present

## 2020-12-10 DIAGNOSIS — D472 Monoclonal gammopathy: Secondary | ICD-10-CM | POA: Diagnosis not present

## 2020-12-10 DIAGNOSIS — T8612 Kidney transplant failure: Secondary | ICD-10-CM | POA: Diagnosis not present

## 2020-12-10 DIAGNOSIS — N186 End stage renal disease: Secondary | ICD-10-CM | POA: Diagnosis not present

## 2020-12-10 DIAGNOSIS — I120 Hypertensive chronic kidney disease with stage 5 chronic kidney disease or end stage renal disease: Secondary | ICD-10-CM | POA: Diagnosis not present

## 2020-12-10 DIAGNOSIS — G4733 Obstructive sleep apnea (adult) (pediatric): Secondary | ICD-10-CM | POA: Diagnosis not present

## 2020-12-10 DIAGNOSIS — R34 Anuria and oliguria: Secondary | ICD-10-CM | POA: Diagnosis not present

## 2020-12-11 DIAGNOSIS — R34 Anuria and oliguria: Secondary | ICD-10-CM | POA: Diagnosis not present

## 2020-12-11 DIAGNOSIS — G4733 Obstructive sleep apnea (adult) (pediatric): Secondary | ICD-10-CM | POA: Diagnosis not present

## 2020-12-11 DIAGNOSIS — T8612 Kidney transplant failure: Secondary | ICD-10-CM | POA: Diagnosis not present

## 2020-12-11 DIAGNOSIS — N186 End stage renal disease: Secondary | ICD-10-CM | POA: Diagnosis not present

## 2020-12-11 DIAGNOSIS — I120 Hypertensive chronic kidney disease with stage 5 chronic kidney disease or end stage renal disease: Secondary | ICD-10-CM | POA: Diagnosis not present

## 2020-12-11 DIAGNOSIS — D472 Monoclonal gammopathy: Secondary | ICD-10-CM | POA: Diagnosis not present

## 2021-01-04 DEATH — deceased
